# Patient Record
Sex: Female | Born: 1935
Health system: Southern US, Community
[De-identification: ages and names within clinical notes are randomized; demographics above are authoritative.]

## PROBLEM LIST (undated history)

## (undated) DIAGNOSIS — R0600 Dyspnea, unspecified: Secondary | ICD-10-CM

## (undated) DIAGNOSIS — I5032 Chronic diastolic (congestive) heart failure: Secondary | ICD-10-CM

## (undated) DIAGNOSIS — R600 Localized edema: Secondary | ICD-10-CM

## (undated) DIAGNOSIS — M545 Low back pain, unspecified: Secondary | ICD-10-CM

## (undated) DIAGNOSIS — Z87442 Personal history of urinary calculi: Secondary | ICD-10-CM

## (undated) DIAGNOSIS — M8580 Other specified disorders of bone density and structure, unspecified site: Secondary | ICD-10-CM

## (undated) DIAGNOSIS — I10 Essential (primary) hypertension: Secondary | ICD-10-CM

## (undated) DIAGNOSIS — M1990 Unspecified osteoarthritis, unspecified site: Secondary | ICD-10-CM

## (undated) DIAGNOSIS — I340 Nonrheumatic mitral (valve) insufficiency: Secondary | ICD-10-CM

## (undated) DIAGNOSIS — R251 Tremor, unspecified: Secondary | ICD-10-CM

## (undated) DIAGNOSIS — H35033 Hypertensive retinopathy, bilateral: Secondary | ICD-10-CM

## (undated) DIAGNOSIS — S86019A Strain of unspecified Achilles tendon, initial encounter: Secondary | ICD-10-CM

## (undated) HISTORY — DX: Other specified disorders of bone density and structure, unspecified site: M85.80

## (undated) HISTORY — DX: Essential (primary) hypertension: I10

## (undated) HISTORY — PX: TRANSTHORACIC ECHOCARDIOGRAM: SHX275

## (undated) HISTORY — DX: Unspecified osteoarthritis, unspecified site: M19.90

## (undated) HISTORY — DX: Hypertensive retinopathy, bilateral: H35.033

## (undated) HISTORY — DX: Strain of unspecified achilles tendon, initial encounter: S86.019A

## (undated) HISTORY — DX: Localized edema: R60.0

## (undated) HISTORY — DX: Low back pain: M54.5

## (undated) HISTORY — PX: TONSILLECTOMY: SUR1361

## (undated) HISTORY — PX: CHOLECYSTECTOMY: SHX55

## (undated) HISTORY — DX: Nonrheumatic mitral (valve) insufficiency: I34.0

## (undated) HISTORY — DX: Tremor, unspecified: R25.1

## (undated) HISTORY — DX: Low back pain, unspecified: M54.50

## (undated) HISTORY — PX: APPENDECTOMY: SHX54

---

## 2001-08-17 ENCOUNTER — Other Ambulatory Visit: Admission: RE | Admit: 2001-08-17 | Discharge: 2001-08-17 | Payer: Self-pay | Admitting: Obstetrics and Gynecology

## 2002-04-28 ENCOUNTER — Emergency Department (HOSPITAL_COMMUNITY): Admission: EM | Admit: 2002-04-28 | Discharge: 2002-04-28 | Payer: Self-pay | Admitting: Emergency Medicine

## 2002-04-28 ENCOUNTER — Encounter: Payer: Self-pay | Admitting: Emergency Medicine

## 2002-04-30 ENCOUNTER — Encounter: Payer: Self-pay | Admitting: Emergency Medicine

## 2002-04-30 ENCOUNTER — Emergency Department (HOSPITAL_COMMUNITY): Admission: EM | Admit: 2002-04-30 | Discharge: 2002-04-30 | Payer: Self-pay | Admitting: Emergency Medicine

## 2002-05-03 ENCOUNTER — Encounter: Payer: Self-pay | Admitting: Neurological Surgery

## 2002-05-03 ENCOUNTER — Inpatient Hospital Stay (HOSPITAL_COMMUNITY): Admission: AD | Admit: 2002-05-03 | Discharge: 2002-05-08 | Payer: Self-pay | Admitting: Neurological Surgery

## 2002-05-14 ENCOUNTER — Encounter: Payer: Self-pay | Admitting: Neurological Surgery

## 2002-05-14 ENCOUNTER — Inpatient Hospital Stay (HOSPITAL_COMMUNITY): Admission: AD | Admit: 2002-05-14 | Discharge: 2002-05-17 | Payer: Self-pay | Admitting: Neurological Surgery

## 2002-05-15 ENCOUNTER — Encounter: Payer: Self-pay | Admitting: Neurological Surgery

## 2002-07-11 ENCOUNTER — Other Ambulatory Visit: Admission: RE | Admit: 2002-07-11 | Discharge: 2002-07-11 | Payer: Self-pay | Admitting: Gynecology

## 2002-07-27 ENCOUNTER — Encounter: Payer: Self-pay | Admitting: Neurological Surgery

## 2002-07-27 ENCOUNTER — Ambulatory Visit (HOSPITAL_COMMUNITY): Admission: RE | Admit: 2002-07-27 | Discharge: 2002-07-27 | Payer: Self-pay | Admitting: Neurological Surgery

## 2002-07-28 ENCOUNTER — Encounter: Payer: Self-pay | Admitting: Neurological Surgery

## 2002-10-24 ENCOUNTER — Encounter: Payer: Self-pay | Admitting: Internal Medicine

## 2002-10-24 ENCOUNTER — Ambulatory Visit (HOSPITAL_COMMUNITY): Admission: RE | Admit: 2002-10-24 | Discharge: 2002-10-24 | Payer: Self-pay | Admitting: Internal Medicine

## 2002-10-25 HISTORY — PX: LUMBAR LAMINECTOMY: SHX95

## 2002-10-25 HISTORY — PX: BACK SURGERY: SHX140

## 2002-12-22 ENCOUNTER — Ambulatory Visit (HOSPITAL_COMMUNITY): Admission: RE | Admit: 2002-12-22 | Discharge: 2002-12-22 | Payer: Self-pay | Admitting: Neurological Surgery

## 2002-12-22 ENCOUNTER — Encounter: Payer: Self-pay | Admitting: Neurological Surgery

## 2003-01-14 ENCOUNTER — Encounter (INDEPENDENT_AMBULATORY_CARE_PROVIDER_SITE_OTHER): Payer: Self-pay | Admitting: *Deleted

## 2003-01-14 ENCOUNTER — Ambulatory Visit (HOSPITAL_COMMUNITY): Admission: RE | Admit: 2003-01-14 | Discharge: 2003-01-14 | Payer: Self-pay | Admitting: Neurological Surgery

## 2003-03-14 ENCOUNTER — Ambulatory Visit (HOSPITAL_BASED_OUTPATIENT_CLINIC_OR_DEPARTMENT_OTHER): Admission: RE | Admit: 2003-03-14 | Discharge: 2003-03-14 | Payer: Self-pay | Admitting: Urology

## 2005-01-18 ENCOUNTER — Ambulatory Visit: Payer: Self-pay | Admitting: Internal Medicine

## 2005-01-27 ENCOUNTER — Ambulatory Visit: Payer: Self-pay | Admitting: Internal Medicine

## 2005-03-01 ENCOUNTER — Ambulatory Visit: Payer: Self-pay | Admitting: Internal Medicine

## 2005-07-01 ENCOUNTER — Ambulatory Visit: Payer: Self-pay | Admitting: Internal Medicine

## 2005-10-04 ENCOUNTER — Ambulatory Visit: Payer: Self-pay | Admitting: Internal Medicine

## 2006-09-21 ENCOUNTER — Ambulatory Visit: Payer: Self-pay | Admitting: Internal Medicine

## 2006-09-21 LAB — CONVERTED CEMR LAB
ALT: 31 units/L (ref 0–40)
AST: 50 units/L — ABNORMAL HIGH (ref 0–37)
BUN: 15 mg/dL (ref 6–23)
Basophils Absolute: 0.1 10*3/uL (ref 0.0–0.1)
Basophils Relative: 1 % (ref 0.0–1.0)
Bilirubin Urine: NEGATIVE
Chol/HDL Ratio, serum: 1.9
Cholesterol: 147 mg/dL (ref 0–200)
Creatinine, Ser: 0.8 mg/dL (ref 0.4–1.2)
Crystals: NEGATIVE
Eosinophil percent: 2.8 % (ref 0.0–5.0)
Glucose, Bld: 106 mg/dL — ABNORMAL HIGH (ref 70–99)
HCT: 43.4 % (ref 36.0–46.0)
HDL: 78.8 mg/dL (ref >39.0)
Hemoglobin, Urine: NEGATIVE
Hemoglobin: 14.3 g/dL (ref 12.0–15.0)
Ketones, ur: NEGATIVE mg/dL
LDL Cholesterol: 62 mg/dL (ref 0–99)
Lymphocytes Relative: 38.7 % (ref 12.0–46.0)
MCHC: 33 g/dL (ref 30.0–36.0)
MCV: 88.9 fL (ref 78.0–100.0)
Monocytes Absolute: 0.5 10*3/uL (ref 0.2–0.7)
Monocytes Relative: 7.5 % (ref 3.0–11.0)
Neutro Abs: 2.9 10*3/uL (ref 1.4–7.7)
Neutrophils Relative %: 50 % (ref 43.0–77.0)
Nitrite: NEGATIVE
Platelets: 389 10*3/uL (ref 150–400)
Potassium: 4.2 meq/L (ref 3.5–5.1)
RBC: 4.88 M/uL (ref 3.87–5.11)
RDW: 13 % (ref 11.5–14.6)
Sodium: 140 meq/L (ref 135–145)
Specific Gravity, Urine: 1.02 (ref 1.000–1.03)
TSH: 1.16 microintl units/mL (ref 0.35–5.50)
Total Bilirubin: 0.8 mg/dL (ref 0.3–1.2)
Total Protein, Urine: NEGATIVE mg/dL
Triglyceride fasting, serum: 33 mg/dL (ref 0–149)
Urine Glucose: NEGATIVE mg/dL
Urobilinogen, UA: 0.2 (ref 0.0–1.0)
VLDL: 7 mg/dL (ref 0–40)
Vitamin B-12: 733 pg/mL (ref 211–911)
WBC: 6 10*3/uL (ref 4.5–10.5)
pH: 7 (ref 5.0–8.0)

## 2006-09-29 ENCOUNTER — Ambulatory Visit: Payer: Self-pay | Admitting: Internal Medicine

## 2006-10-19 ENCOUNTER — Ambulatory Visit (HOSPITAL_COMMUNITY): Admission: RE | Admit: 2006-10-19 | Discharge: 2006-10-19 | Payer: Self-pay | Admitting: Internal Medicine

## 2006-11-09 ENCOUNTER — Ambulatory Visit: Payer: Self-pay | Admitting: Internal Medicine

## 2007-04-03 ENCOUNTER — Ambulatory Visit: Payer: Self-pay | Admitting: Internal Medicine

## 2007-04-07 ENCOUNTER — Ambulatory Visit: Payer: Self-pay | Admitting: Internal Medicine

## 2007-04-07 LAB — CONVERTED CEMR LAB
BUN: 26 mg/dL — ABNORMAL HIGH (ref 6–23)
CO2: 31 meq/L (ref 19–32)
Calcium: 9.3 mg/dL (ref 8.4–10.5)
Chloride: 105 meq/L (ref 96–112)
Creatinine, Ser: 0.8 mg/dL (ref 0.4–1.2)
GFR calc Af Amer: 91 mL/min
GFR calc non Af Amer: 75 mL/min
Glucose, Bld: 98 mg/dL (ref 70–99)
Potassium: 4.2 meq/L (ref 3.5–5.1)
Sodium: 142 meq/L (ref 135–145)
TSH: 1.41 microintl units/mL (ref 0.35–5.50)
Vit D, 1,25-Dihydroxy: 52 (ref 20–57)

## 2007-05-24 ENCOUNTER — Ambulatory Visit: Payer: Self-pay | Admitting: Internal Medicine

## 2007-10-20 ENCOUNTER — Ambulatory Visit: Payer: Self-pay | Admitting: Internal Medicine

## 2007-10-20 DIAGNOSIS — M79609 Pain in unspecified limb: Secondary | ICD-10-CM | POA: Insufficient documentation

## 2007-10-20 DIAGNOSIS — M109 Gout, unspecified: Secondary | ICD-10-CM | POA: Insufficient documentation

## 2007-10-21 DIAGNOSIS — I10 Essential (primary) hypertension: Secondary | ICD-10-CM | POA: Insufficient documentation

## 2007-10-21 DIAGNOSIS — M899 Disorder of bone, unspecified: Secondary | ICD-10-CM | POA: Insufficient documentation

## 2007-10-21 DIAGNOSIS — M545 Low back pain, unspecified: Secondary | ICD-10-CM | POA: Insufficient documentation

## 2007-10-21 DIAGNOSIS — M1990 Unspecified osteoarthritis, unspecified site: Secondary | ICD-10-CM | POA: Insufficient documentation

## 2007-10-21 DIAGNOSIS — M4180 Other forms of scoliosis, site unspecified: Secondary | ICD-10-CM | POA: Insufficient documentation

## 2007-10-21 DIAGNOSIS — M949 Disorder of cartilage, unspecified: Secondary | ICD-10-CM

## 2007-10-25 ENCOUNTER — Ambulatory Visit: Payer: Self-pay | Admitting: Internal Medicine

## 2007-10-31 LAB — CONVERTED CEMR LAB
BUN: 18 mg/dL (ref 6–23)
CO2: 33 meq/L — ABNORMAL HIGH (ref 19–32)
Calcium: 9 mg/dL (ref 8.4–10.5)
Chloride: 102 meq/L (ref 96–112)
Creatinine, Ser: 0.6 mg/dL (ref 0.4–1.2)
GFR calc Af Amer: 127 mL/min
GFR calc non Af Amer: 105 mL/min
Glucose, Bld: 107 mg/dL — ABNORMAL HIGH (ref 70–99)
Potassium: 3.8 meq/L (ref 3.5–5.1)
Sodium: 142 meq/L (ref 135–145)
Uric Acid, Serum: 4.3 mg/dL (ref 2.4–7.0)

## 2007-11-10 ENCOUNTER — Ambulatory Visit: Payer: Self-pay | Admitting: Internal Medicine

## 2007-11-10 DIAGNOSIS — R21 Rash and other nonspecific skin eruption: Secondary | ICD-10-CM | POA: Insufficient documentation

## 2008-01-25 ENCOUNTER — Ambulatory Visit: Payer: Self-pay | Admitting: Internal Medicine

## 2008-01-25 ENCOUNTER — Encounter (INDEPENDENT_AMBULATORY_CARE_PROVIDER_SITE_OTHER): Payer: Self-pay | Admitting: *Deleted

## 2008-01-25 DIAGNOSIS — H9190 Unspecified hearing loss, unspecified ear: Secondary | ICD-10-CM | POA: Insufficient documentation

## 2008-02-01 ENCOUNTER — Ambulatory Visit: Payer: Self-pay | Admitting: Internal Medicine

## 2008-02-01 DIAGNOSIS — L0293 Carbuncle, unspecified: Secondary | ICD-10-CM

## 2008-02-01 DIAGNOSIS — L0292 Furuncle, unspecified: Secondary | ICD-10-CM | POA: Insufficient documentation

## 2008-07-03 ENCOUNTER — Encounter: Payer: Self-pay | Admitting: Internal Medicine

## 2008-07-19 ENCOUNTER — Ambulatory Visit: Payer: Self-pay | Admitting: Internal Medicine

## 2008-08-01 ENCOUNTER — Encounter: Payer: Self-pay | Admitting: Internal Medicine

## 2008-11-11 ENCOUNTER — Ambulatory Visit: Payer: Self-pay | Admitting: Internal Medicine

## 2008-11-11 DIAGNOSIS — R252 Cramp and spasm: Secondary | ICD-10-CM | POA: Insufficient documentation

## 2008-11-13 LAB — CONVERTED CEMR LAB
ALT: 22 units/L (ref 0–35)
AST: 25 units/L (ref 0–37)
Albumin: 4.2 g/dL (ref 3.5–5.2)
Alkaline Phosphatase: 68 units/L (ref 39–117)
BUN: 22 mg/dL (ref 6–23)
Bacteria, UA: NEGATIVE
Basophils Absolute: 0 10*3/uL (ref 0.0–0.1)
Basophils Relative: 0.2 % (ref 0.0–3.0)
Bilirubin Urine: NEGATIVE
Bilirubin, Direct: 0.1 mg/dL (ref 0.0–0.3)
CO2: 32 meq/L (ref 19–32)
Calcium: 9.1 mg/dL (ref 8.4–10.5)
Chloride: 105 meq/L (ref 96–112)
Cholesterol: 153 mg/dL (ref 0–200)
Creatinine, Ser: 0.7 mg/dL (ref 0.4–1.2)
Crystals: NEGATIVE
Eosinophils Absolute: 0.2 10*3/uL (ref 0.0–0.7)
Eosinophils Relative: 2.3 % (ref 0.0–5.0)
GFR calc Af Amer: 106 mL/min
GFR calc non Af Amer: 88 mL/min
Glucose, Bld: 105 mg/dL — ABNORMAL HIGH (ref 70–99)
HCT: 42.2 % (ref 36.0–46.0)
HDL: 85.3 mg/dL (ref >39.0)
Hemoglobin: 14.7 g/dL (ref 12.0–15.0)
Ketones, ur: 15 mg/dL — AB
LDL Cholesterol: 64 mg/dL (ref 0–99)
Leukocytes, UA: NEGATIVE
Lymphocytes Relative: 29.4 % (ref 12.0–46.0)
MCHC: 34.8 g/dL (ref 30.0–36.0)
MCV: 87.9 fL (ref 78.0–100.0)
Monocytes Absolute: 0.8 10*3/uL (ref 0.1–1.0)
Monocytes Relative: 9.1 % (ref 3.0–12.0)
Mucus, UA: NEGATIVE
Neutro Abs: 5.5 10*3/uL (ref 1.4–7.7)
Neutrophils Relative %: 59 % (ref 43.0–77.0)
Nitrite: NEGATIVE
Platelets: 313 10*3/uL (ref 150–400)
Potassium: 3.7 meq/L (ref 3.5–5.1)
RBC: 4.8 M/uL (ref 3.87–5.11)
RDW: 13.8 % (ref 11.5–14.6)
Sodium: 143 meq/L (ref 135–145)
Specific Gravity, Urine: >=1.03 (ref 1.000–1.03)
TSH: 1.78 microintl units/mL (ref 0.35–5.50)
Total Bilirubin: 1 mg/dL (ref 0.3–1.2)
Total CHOL/HDL Ratio: 1.8
Total Protein, Urine: NEGATIVE mg/dL
Total Protein: 7.3 g/dL (ref 6.0–8.3)
Triglycerides: 20 mg/dL (ref 0–149)
Urine Glucose: NEGATIVE mg/dL
Urobilinogen, UA: 0.2 (ref 0.0–1.0)
VLDL: 4 mg/dL (ref 0–40)
WBC: 9.2 10*3/uL (ref 4.5–10.5)
pH: 5 (ref 5.0–8.0)

## 2008-11-22 ENCOUNTER — Telehealth: Payer: Self-pay | Admitting: Internal Medicine

## 2008-12-16 ENCOUNTER — Ambulatory Visit: Payer: Self-pay | Admitting: Internal Medicine

## 2008-12-23 ENCOUNTER — Encounter (INDEPENDENT_AMBULATORY_CARE_PROVIDER_SITE_OTHER): Payer: Self-pay | Admitting: *Deleted

## 2009-01-06 ENCOUNTER — Encounter: Payer: Self-pay | Admitting: Internal Medicine

## 2009-02-06 ENCOUNTER — Encounter: Payer: Self-pay | Admitting: Internal Medicine

## 2009-02-07 ENCOUNTER — Encounter: Payer: Self-pay | Admitting: Internal Medicine

## 2009-02-10 ENCOUNTER — Ambulatory Visit: Payer: Self-pay | Admitting: Internal Medicine

## 2009-02-10 LAB — CONVERTED CEMR LAB
BUN: 30 mg/dL — ABNORMAL HIGH (ref 6–23)
CO2: 31 meq/L (ref 19–32)
Calcium: 9.5 mg/dL (ref 8.4–10.5)
Chloride: 106 meq/L (ref 96–112)
Creatinine, Ser: 0.8 mg/dL (ref 0.4–1.2)
GFR calc non Af Amer: 74.95 mL/min (ref >60)
Glucose, Bld: 88 mg/dL (ref 70–99)
Potassium: 4.4 meq/L (ref 3.5–5.1)
Sodium: 143 meq/L (ref 135–145)
TSH: 2.27 microintl units/mL (ref 0.35–5.50)
Vitamin B-12: 850 pg/mL (ref 211–911)

## 2009-02-12 ENCOUNTER — Ambulatory Visit: Payer: Self-pay | Admitting: Internal Medicine

## 2009-02-12 DIAGNOSIS — R609 Edema, unspecified: Secondary | ICD-10-CM | POA: Insufficient documentation

## 2009-02-12 DIAGNOSIS — R635 Abnormal weight gain: Secondary | ICD-10-CM | POA: Insufficient documentation

## 2009-07-01 ENCOUNTER — Telehealth: Payer: Self-pay | Admitting: Internal Medicine

## 2009-07-14 ENCOUNTER — Ambulatory Visit: Payer: Self-pay | Admitting: Internal Medicine

## 2009-11-21 ENCOUNTER — Ambulatory Visit: Payer: Self-pay | Admitting: Internal Medicine

## 2009-11-25 ENCOUNTER — Telehealth: Payer: Self-pay | Admitting: Internal Medicine

## 2009-11-27 ENCOUNTER — Encounter: Payer: Self-pay | Admitting: Internal Medicine

## 2009-11-28 ENCOUNTER — Telehealth: Payer: Self-pay | Admitting: Internal Medicine

## 2009-12-03 ENCOUNTER — Encounter: Payer: Self-pay | Admitting: Internal Medicine

## 2009-12-04 ENCOUNTER — Telehealth: Payer: Self-pay | Admitting: Internal Medicine

## 2009-12-08 ENCOUNTER — Telehealth: Payer: Self-pay | Admitting: Internal Medicine

## 2010-05-19 ENCOUNTER — Ambulatory Visit: Payer: Self-pay | Admitting: Internal Medicine

## 2010-05-19 LAB — CONVERTED CEMR LAB
ALT: 19 units/L (ref 0–35)
AST: 20 units/L (ref 0–37)
Albumin: 3.8 g/dL (ref 3.5–5.2)
Alkaline Phosphatase: 67 units/L (ref 39–117)
BUN: 26 mg/dL — ABNORMAL HIGH (ref 6–23)
Bilirubin Urine: NEGATIVE
Bilirubin, Direct: 0.1 mg/dL (ref 0.0–0.3)
CO2: 31 meq/L (ref 19–32)
Calcium: 8.7 mg/dL (ref 8.4–10.5)
Chloride: 105 meq/L (ref 96–112)
Cholesterol: 137 mg/dL (ref 0–200)
Creatinine, Ser: 0.7 mg/dL (ref 0.4–1.2)
GFR calc non Af Amer: 85.71 mL/min (ref >60)
Glucose, Bld: 83 mg/dL (ref 70–99)
HDL: 65.7 mg/dL (ref >39.00)
Ketones, ur: NEGATIVE mg/dL
LDL Cholesterol: 65 mg/dL (ref 0–99)
Nitrite: NEGATIVE
Potassium: 4.4 meq/L (ref 3.5–5.1)
Sodium: 142 meq/L (ref 135–145)
Specific Gravity, Urine: >=1.03 (ref 1.000–1.030)
TSH: 3.65 microintl units/mL (ref 0.35–5.50)
Total Bilirubin: 0.5 mg/dL (ref 0.3–1.2)
Total CHOL/HDL Ratio: 2
Total Protein: 6.5 g/dL (ref 6.0–8.3)
Triglycerides: 34 mg/dL (ref 0.0–149.0)
Urine Glucose: NEGATIVE mg/dL
Urobilinogen, UA: 0.2 (ref 0.0–1.0)
VLDL: 6.8 mg/dL (ref 0.0–40.0)
pH: 6 (ref 5.0–8.0)

## 2010-05-21 ENCOUNTER — Ambulatory Visit: Payer: Self-pay | Admitting: Internal Medicine

## 2010-05-21 DIAGNOSIS — N3090 Cystitis, unspecified without hematuria: Secondary | ICD-10-CM | POA: Insufficient documentation

## 2010-05-21 DIAGNOSIS — M542 Cervicalgia: Secondary | ICD-10-CM | POA: Insufficient documentation

## 2010-11-19 ENCOUNTER — Ambulatory Visit
Admission: RE | Admit: 2010-11-19 | Discharge: 2010-11-19 | Payer: Self-pay | Source: Home / Self Care | Attending: Internal Medicine | Admitting: Internal Medicine

## 2010-11-20 ENCOUNTER — Telehealth: Payer: Self-pay | Admitting: Internal Medicine

## 2010-11-24 NOTE — Assessment & Plan Note (Signed)
Summary: PER PT [facility]  $50  D/T   STC   Vital Signs:  Patient Profile:   76 Years Old Female Height:     63 inches Weight:      150 pounds Temp:     97.5 degrees F oral Pulse rate:   71 / minute BP sitting:   146 / 70  (left arm)  Vitals Entered By: Tora Perches (December 16, 2008 4:22 PM)                 Chief Complaint:  Multiple medical problems or concerns.  History of Present Illness: C/o swelling of R eye Meloxicam is not working for OA    Prior Medications Reviewed Using: Patient Recall  Updated Prior Medication List: VITAMIN D3 1000 UNIT  TABS (CHOLECALCIFEROL) 1 by mouth daily TRETINOIN 0.1 % CREA (TRETINOIN) use qd LORATADINE 10 MG  TABS (LORATADINE) once daily as needed allergies MELOXICAM 15 MG TABS (MELOXICAM) one by mouth daily pc prn BENICAR 40 MG TABS (OLMESARTAN MEDOXOMIL) once daily  Current Allergies (reviewed today): ! BARBITURATES ! LASIX ! HYDROCHLOROTHIAZIDE MICARDIS (TELMISARTAN)  Past Medical History:    Reviewed history from 10/20/2007 and no changes required:       Hypertension       Low back pain       Osteoarthritis       Osteopenia   Family History:    Reviewed history from 10/20/2007 and no changes required:       Family History Hypertension  Social History:    Reviewed history from 10/25/2007 and no changes required:       Married       Alcohol use-no     Physical Exam  General:     Well-developed,well-nourished,in no acute distress; alert,appropriate and cooperative throughout examination Eyes:     R upper eyelid is swollen and tender lateally with an oval mass sq Nose:     External nasal examination shows no deformity or inflammation. Nasal mucosa are pink and moist without lesions or exudates. Mouth:     Oral mucosa and oropharynx without lesions or exudates.  Teeth in good repair. Lungs:     Normal respiratory effort, chest expands symmetrically. Lungs are clear to auscultation, no crackles or  wheezes. Heart:     Normal rate and regular rhythm. S1 and S2 normal without gallop, murmur, click, rub or other extra sounds.    Impression & Recommendations:  Problem # 1:  BOILS, RECURRENT (ICD-680.9) - R upper eyelid seb. cyst, infected Assessment: Improved Oph consult Orders: Ophthalmology Referral (Ophthalmology)   Problem # 2:  OSTEOARTHRITIS (ICD-715.90) Assessment: Comment Only Celebrex needs PA Her updated medication list for this problem includes:    Meloxicam 15 Mg Tabs (Meloxicam) ..... One by mouth daily pc as needed - not helping  Her updated medication list for this problem includes:    Meloxicam 15 Mg Tabs (Meloxicam) ..... One by mouth daily pc prn   Complete Medication List: 1)  Vitamin D3 1000 Unit Tabs (Cholecalciferol) .Marland Kitchen.. 1 by mouth daily 2)  Tretinoin 0.1 % Crea (Tretinoin) .... Use qd 3)  Loratadine 10 Mg Tabs (Loratadine) .... Once daily as needed allergies 4)  Meloxicam 15 Mg Tabs (Meloxicam) .... One by mouth daily pc prn 5)  Benicar 40 Mg Tabs (Olmesartan medoxomil) .... Once daily   Patient Instructions: 1)  Call if you are not better in a reasonable ammount of time or if worse.

## 2010-11-24 NOTE — Progress Notes (Signed)
Summary: Celebrex  Phone Note Call from Patient   Summary of Call: Celebrex has been denied. Please advise. Initial call taken by: Lucious Groves,  November 28, 2009 4:38 PM  Follow-up for Phone Call        Noted. Thx Follow-up by: Tresa Garter MD,  December 01, 2009 11:42 AM  Additional Follow-up for Phone Call Additional follow up Details #1::        I will notify patient. Closed other phone note to prevent confusion, but patient needs to switch Benicar to Losartan due to cost. Is this ok? Additional Follow-up by: Lucious Groves,  December 02, 2009 9:58 AM    Additional Follow-up for Phone Call Additional follow up Details #2::    OK Thx Follow-up by: Tresa Garter MD,  December 03, 2009 7:43 AM  Additional Follow-up for Phone Call Additional follow up Details #3:: Details for Additional Follow-up Action Taken: Left message on machine to call back to office. Lucious Groves  December 03, 2009 10:31 AM   Spoke with pt, she is aware Additional Follow-up by: Lamar Sprinkles, CMA,  December 04, 2009 8:16 AM  New/Updated Medications: COZAAR 100 MG TABS (LOSARTAN POTASSIUM) 1 by mouth qd Prescriptions: COZAAR 100 MG TABS (LOSARTAN POTASSIUM) 1 by mouth qd  #90 x 3   Entered by:   Lamar Sprinkles, CMA   Authorized by:   Tresa Garter MD   Signed by:   Lamar Sprinkles, CMA on 12/04/2009   Method used:   Electronically to        [facility] [phone]* (retail)       [address] [place], [place]  [address]       Ph: [phone]       Fax: [phone]   RxID:   [id number] COZAAR 100 MG TABS (LOSARTAN POTASSIUM) 1 by mouth qd  #90 x 3   Entered and Authorized by:   Tresa Garter MD   Signed by:   Daphane Shepherd on 12/04/2009   Method used:   Electronically to        [facility] [phone]* (retail)       [address] [place], [place]  [address]       Ph: [phone]       Fax: [phone]   RxID:   [id number]  rx sent by Alvy Beal from [facility]

## 2010-11-24 NOTE — Medication Information (Signed)
Summary: Celebrex Approved/[facility]  Celebrex Approved/[facility]   Imported By: Sherian Rein 02/11/2009 11:20:00  _____________________________________________________________________  External Attachment:    Type:   Image     Comment:   External Document

## 2010-11-24 NOTE — Miscellaneous (Signed)
Summary: loratadine  Clinical Lists Changes  Medications: Added new medication of LORATADINE 10 MG  TABS (LORATADINE) once daily as needed - Signed Rx of LORATADINE 10 MG  TABS (LORATADINE) once daily as needed;  #90 x 0;  Signed;  Entered by: Tora Perches;  Authorized by: Tresa Garter MD;  Method used: Electronic    Prescriptions: LORATADINE 10 MG  TABS (LORATADINE) once daily as needed  #90 x 0   Entered by:   Tora Perches   Authorized by:   Tresa Garter MD   Signed by:   Tora Perches on 10/20/2007   Method used:   Electronically sent to ...       [facility]  [address]*       [address] [place], [place]  [address]       Ph: [phone]       Fax: [phone]   RxID:   [id number]

## 2010-11-24 NOTE — Assessment & Plan Note (Signed)
Summary: L ANKLE RED PAIN-INSIDE-STC   Vital Signs:  Patient Profile:   76 Years Old Female Weight:      146 pounds Temp:     97.3 degrees F oral Pulse rate:   75 / minute BP sitting:   136 / 72  (left arm)  Vitals Entered By: Tora Perches (October 20, 2007 3:10 PM)             Is Patient Diabetic? No     Chief Complaint:  Multiple medical problems or concerns.  History of Present Illness: The patient presents with complaints of sore throat, fever, cough, sinus congestion and drainge of several days duration. Not better with OTC meds. Chest hurts with coughing.  The mucus is colored. C/o L medial malleolus redness and pain  x3 d    Past Medical History:    Hypertension    Low back pain    Osteoarthritis    Osteopenia   Family History:    Family History Hypertension  Social History:    Occupation:    Married    Risk Factors:  Tobacco use:  never    Physical Exam  General:     Well-developed,well-nourished,in no acute distress; alert,appropriate and cooperative throughout examination Eyes:     No corneal or conjunctival inflammation noted. EOMI. Perrla. Funduscopic exam benign, without hemorrhages, exudates or papilledema. Vision grossly normal. Mouth:     Oral mucosa and oropharynx without lesions or exudates.  Teeth in good repair. Neck:     No deformities, masses, or tenderness noted. Lungs:     Normal respiratory effort, chest expands symmetrically. Lungs are clear to auscultation, no crackles or wheezes. Heart:     Normal rate and regular rhythm. S1 and S2 normal without gallop, murmur, click, rub or other extra sounds. Msk:     L med. malleolus is tender Extremities:     No clubbing, cyanosis, edema, or deformity noted with normal full range of motion of all joints.   Skin:     3 cm red tender area over the med. malleolus Psych:     Cognition and judgment appear intact. Alert and cooperative with normal attention span and concentration. No  apparent delusions, illusions, hallucinations    Impression & Recommendations:  Problem # 1:  UPPER RESPIRATORY INFECTION (URI) (ICD-465.9) Zpac Her updated medication list for this problem includes:    Loratadine 10 Mg Tabs (Loratadine) ..... Once daily as needed      Her updated medication list for this problem includes:    Loratadine 10 Mg Tabs (Loratadine) ..... Once daily as needed    Indomethacin 50 Mg Caps (Indomethacin) .Marland Kitchen... 1 by mouth three times a day x 1-2d, then two times a day as needed gout   Problem # 2:  LEG PAIN (ICD-729.5) Poss. gout. Remote poss. of cellulitis (doubt). Indocin and Z pac  Complete Medication List: 1)  Loratadine 10 Mg Tabs (Loratadine) .... Once daily as needed 2)  Indomethacin 50 Mg Caps (Indomethacin) .Marland Kitchen.. 1 by mouth three times a day x 1-2d, then two times a day as needed gout 3)  Zithromax Z-pak 250 Mg Tabs (Azithromycin) .... As directed 4)  Vitamin D3 1000 Unit Tabs (Cholecalciferol) .Marland Kitchen.. 1 qd     Prescriptions: INDOMETHACIN 50 MG CAPS (INDOMETHACIN) 1 by mouth three times a day x 1-2d, then two times a day as needed gout  #60 x 3   Entered and Authorized by:   Tresa Garter MD   Signed  by:   Tresa Garter MD on 10/20/2007   Method used:   Print then Give to Patient   RxID:   [id number] ZITHROMAX Z-PAK 250 MG  TABS (AZITHROMYCIN) as directed  #1 x 0   Entered and Authorized by:   Tresa Garter MD   Signed by:   Tresa Garter MD on 10/20/2007   Method used:   Print then Give to Patient   RxID:   [id number] ZITHROMAX Z-PAK 250 MG  TABS (AZITHROMYCIN) as directed  #1 x 0   Entered and Authorized by:   Tresa Garter MD   Signed by:   Tresa Garter MD on 10/20/2007   Method used:   Electronically sent to ...       [facility]  [address]*       [address] [place], [place]  [address]       Ph: [phone]       Fax: [phone]   RxID:   [id number] INDOMETHACIN 50 MG CAPS  (INDOMETHACIN) 1 by mouth three times a day x 1-2d, then two times a day as needed gout  #60 x 3   Entered and Authorized by:   Tresa Garter MD   Signed by:   Tresa Garter MD on 10/20/2007   Method used:   Electronically sent to ...       [facility]  [address]       [address] [place], [place]  [address]       Ph: [phone]       Fax: [phone]   RxID:   [id number]  ]

## 2010-11-24 NOTE — Assessment & Plan Note (Signed)
Summary: FU Erin [name] #   Vital Signs:  Patient profile:   76 year old female Weight:      147 pounds Temp:     97.3 degrees F oral Pulse rate:   77 / minute BP sitting:   134 / 80  (left arm)  Vitals Entered By: Tora Perches (July 14, 2009 3:55 PM) CC: f/u Is Patient Diabetic? No   Primary Care Provider:  Tresa Garter MD  CC:  f/u.  History of Present Illness: The patient presents for a follow up of hypertension, wt gain, OA   Current Medications (verified): 1)  Vitamin D3 1000 Unit  Tabs (Cholecalciferol) .Marland Kitchen.. 1 By Mouth Daily 2)  Tretinoin 0.1 % Crea (Tretinoin) .... Use Qd 3)  Loratadine 10 Mg  Tabs (Loratadine) .... Once Daily As Needed Allergies 4)  Furosemide 20 Mg Tabs (Furosemide) .... Take 1 Tab By Mouth Every Day As Needed Swelling 5)  Phentermine Hcl 37.5 Mg Tabs (Phentermine Hcl) .Marland Kitchen.. 1 By Mouth Q Am 6)  Potassium Chloride Cr 10 Meq  Tbcr (Potassium Chloride) .Marland Kitchen.. 1 By Mouth Once Daily With Lasix 7)  Avapro 300 Mg Tabs (Irbesartan) .Marland Kitchen.. 1 By Mouth Qd  Allergies: 1)  ! Barbiturates 2)  ! Lasix 3)  ! Hydrochlorothiazide 4)  Micardis (Telmisartan) 5)  Benicar (Olmesartan Medoxomil)  Past History:  Past Medical History: Last updated: 10/20/2007 Hypertension Low back pain Osteoarthritis Osteopenia  Social History: Last updated: 10/25/2007 Married Alcohol use-no  Review of Systems  The patient denies fever, chest pain, dyspnea on exertion, and abdominal pain.    Physical Exam  General:  Well-developed,well-nourished,in no acute distress; alert,appropriate and cooperative throughout examination Nose:  External nasal examination shows no deformity or inflammation. Nasal mucosa are pink and moist without lesions or exudates. Mouth:  Oral mucosa and oropharynx without lesions or exudates.  Teeth in good repair. Lungs:  Normal respiratory effort, chest expands symmetrically. Lungs are clear to auscultation, no crackles or wheezes. Heart:   Normal rate and regular rhythm. S1 and S2 normal without gallop, murmur, click, rub or other extra sounds. Abdomen:  Bowel sounds positive,abdomen soft and non-tender without masses, organomegaly or hernias noted. Msk:  No deformity or scoliosis noted of thoracic or lumbar spine.   Extremities:  No clubbing, cyanosis, edema, or deformity noted with normal full range of motion of all joints.   Neurologic:  No cranial nerve deficits noted. Station and gait are normal. Plantar reflexes are down-going bilaterally. DTRs are symmetrical throughout. Sensory, motor and coordinative functions appear intact. Skin:  Intact without suspicious lesions or rashes Psych:  Cognition and judgment appear intact. Alert and cooperative with normal attention span and concentration. No apparent delusions, illusions, hallucinations   Impression & Recommendations:  Problem # 1:  HYPERTENSION (ICD-401.9) Assessment Improved  Her updated medication list for this problem includes:    Furosemide 20 Mg Tabs (Furosemide) .Marland Kitchen... Take 1 tab by mouth every day as needed swelling    Avapro 300 Mg Tabs (Irbesartan) .Marland Kitchen... 1 by mouth qd  Problem # 2:  LOW BACK PAIN (ICD-724.2) Assessment: Improved  The following medications were removed from the medication list:    Meloxicam 15 Mg Tabs (Meloxicam) ..... One by mouth daily pc prn  Problem # 3:  OSTEOARTHRITIS (ICD-715.90) Assessment: Unchanged  The following medications were removed from the medication list:    Meloxicam 15 Mg Tabs (Meloxicam) ..... One by mouth daily pc prn  Problem # 4:  WEIGHT GAIN (ICD-783.1) Assessment:  Comment Only On prescription therapy  See "Patient Instructions".   Complete Medication List: 1)  Vitamin D3 1000 Unit Tabs (Cholecalciferol) .Marland Kitchen.. 1 by mouth daily 2)  Tretinoin 0.1 % Crea (Tretinoin) .... Use qd 3)  Loratadine 10 Mg Tabs (Loratadine) .... Once daily as needed allergies 4)  Furosemide 20 Mg Tabs (Furosemide) .... Take 1 tab by  mouth every day as needed swelling 5)  Phentermine Hcl 37.5 Mg Tabs (Phentermine hcl) .Marland Kitchen.. 1 by mouth q am 6)  Potassium Chloride Cr 10 Meq Tbcr (Potassium chloride) .Marland Kitchen.. 1 by mouth once daily with lasix 7)  Avapro 300 Mg Tabs (Irbesartan) .Marland Kitchen.. 1 by mouth qd  Patient Instructions: 1)  Try to eat more raw plant food, fresh and dry fruit, raw almonds, leafy vegetables, whole foods and less red meat, less animal fat. Poultry and fish is better for you than pork and beef. Avoid processed foods (canned soups, hot dogs, sausage, bacon , frozen dinners). Avoid corn syrup, high fructose syrup or aspartam and Splenda  containing drinks. Honey, Agave and Stevia are better sweeteners. Make your own  dressing with olive oil, wine vinegar, lemon juce, garlic etc. for your salads. 2)  Please schedule a follow-up appointment in 6 months. 3)  BMP prior to visit, ICD-9: 4)  Hepatic Panel prior to visit, ICD-9: 5)  Lipid Panel prior to visit, ICD-9:401.1 6)  TSH prior to visit, ICD-9: Prescriptions: AVAPRO 300 MG TABS (IRBESARTAN) 1 by mouth qd  #90 x 3   Entered and Authorized by:   Tresa Garter MD   Signed by:   Tresa Garter MD on 07/14/2009   Method used:   Print then Give to Patient   RxID:   [id number] PHENTERMINE HCL 37.5 MG TABS (PHENTERMINE HCL) 1 by mouth q am  #30 x 1   Entered and Authorized by:   Tresa Garter MD   Signed by:   Tresa Garter MD on 07/14/2009   Method used:   Print then Give to Patient   RxID:   [id number]

## 2010-11-24 NOTE — Progress Notes (Signed)
Summary: Benicar  Phone Note Call from Patient   Summary of Call: Office rec'd note from [facility] that patient was given a temporary supply of Benicar. Called patient to discuss b/c this has already been removed from med list. Left message on machine to call back to office. Initial call taken by: Lucious Groves,  December 08, 2009 3:35 PM  Follow-up for Phone Call        Left message on machine to call back to office. Follow-up by: Lucious Groves,  December 09, 2009 1:33 PM  Additional Follow-up for Phone Call Additional follow up Details #1::        Patient confirmed that the Benicar is not needed. Closed phone note. Additional Follow-up by: Lucious Groves,  December 10, 2009 8:23 AM

## 2010-11-24 NOTE — Medication Information (Signed)
Summary: Prior Authorization for Celebrex/[facility]  Prior Authorization for Celebrex/[facility]   Imported By: Sherian Rein 02/15/2009 09:30:45  _____________________________________________________________________  External Attachment:    Type:   Image     Comment:   External Document

## 2010-11-24 NOTE — Assessment & Plan Note (Signed)
Summary: MEDS---D/T---STC   Vital Signs:  Patient profile:   76 year old female Weight:      148 pounds BMI:     26.31 Temp:     97.8 degrees F oral Pulse rate:   73 / minute BP sitting:   136 / 96  (right arm)  Vitals Entered By: Tora Perches (November 21, 2009 8:17 AM) CC: f/u Is Patient Diabetic? No   Primary Care Provider:  Tresa Garter MD  CC:  f/u.  History of Present Illness: The patient presents for a follow up of hypertension, hyperlipidemia, OA. C/o HA w/Avapro - stopped. C/o cramps  Preventive Screening-Counseling & Management  Alcohol-Tobacco     Smoking Status: never  Current Medications (verified): 1)  Vitamin D3 1000 Unit  Tabs (Cholecalciferol) .Marland Kitchen.. 1 By Mouth Daily 2)  Tretinoin 0.1 % Crea (Tretinoin) .... Use Qd 3)  Loratadine 10 Mg  Tabs (Loratadine) .... Once Daily As Needed Allergies 4)  Furosemide 20 Mg Tabs (Furosemide) .... Take 1 Tab By Mouth Every Day As Needed Swelling 5)  Phentermine Hcl 37.5 Mg Tabs (Phentermine Hcl) .Marland Kitchen.. 1 By Mouth Q Am 6)  Potassium Chloride Cr 10 Meq  Tbcr (Potassium Chloride) .Marland Kitchen.. 1 By Mouth Once Daily With Lasix  Allergies: 1)  ! Barbiturates 2)  ! Lasix 3)  ! Hydrochlorothiazide 4)  Micardis (Telmisartan) 5)  Benicar (Olmesartan Medoxomil) 6)  Avapro (Irbesartan)  Past History:  Past Medical History: Last updated: 10/20/2007 Hypertension Low back pain Osteoarthritis Osteopenia  Past Surgical History: Last updated: 11/10/2007 Lumbar laminectomy  Social History: Last updated: 10/25/2007 Married Alcohol use-no  Review of Systems  The patient denies fever, chest pain, syncope, dyspnea on exertion, and abdominal pain.    Physical Exam  General:  Well-developed,well-nourished,in no acute distress; alert,appropriate and cooperative throughout examination Nose:  External nasal examination shows no deformity or inflammation. Nasal mucosa are pink and moist without lesions or exudates. Mouth:   Oral mucosa and oropharynx without lesions or exudates.  Teeth in good repair. Lungs:  Normal respiratory effort, chest expands symmetrically. Lungs are clear to auscultation, no crackles or wheezes. Heart:  Normal rate and regular rhythm. S1 and S2 normal without gallop, murmur, click, rub or other extra sounds. Abdomen:  Bowel sounds positive,abdomen soft and non-tender without masses, organomegaly or hernias noted. Msk:  No deformity or scoliosis noted of thoracic or lumbar spine.   Neurologic:  No cranial nerve deficits noted. Station and gait are normal. Plantar reflexes are down-going bilaterally. DTRs are symmetrical throughout. Sensory, motor and coordinative functions appear intact. Skin:  Intact without suspicious lesions or rashes Psych:  Cognition and judgment appear intact. Alert and cooperative with normal attention span and concentration. No apparent delusions, illusions, hallucinations   Impression & Recommendations:  Problem # 1:  HYPERTENSION (ICD-401.9) Assessment Unchanged  The following medications were removed from the medication list:    Avapro 300 Mg Tabs (Irbesartan) .Marland Kitchen... 1 by mouth once daily she stopped due to a HA Her updated medication list for this problem includes:    Furosemide 20 Mg Tabs (Furosemide) .Marland Kitchen... Take 1 tab by mouth every day as needed swelling  Problem # 2:  CRAMPS,LEG (ICD-729.82) due to Lasix - mild Assessment: Comment Only Diazepam prn  Problem # 3:  OSTEOARTHRITIS (ICD-715.90) Assessment: Unchanged  Her updated medication list for this problem includes:    Celebrex 200 Mg Caps (Celecoxib) .Marland Kitchen... 1 by mouth two times a day pc as needed for arthritis  Problem #  4:  GOUT, UNSPECIFIED (ICD-274.9) Assessment: Comment Only No recurrence  Complete Medication List: 1)  Vitamin D3 1000 Unit Tabs (Cholecalciferol) .Marland Kitchen.. 1 by mouth daily 2)  Tretinoin 0.1 % Crea (Tretinoin) .... Use qd 3)  Loratadine 10 Mg Tabs (Loratadine) .... Once daily as  needed allergies 4)  Furosemide 20 Mg Tabs (Furosemide) .... Take 1 tab by mouth every day as needed swelling 5)  Phentermine Hcl 37.5 Mg Tabs (Phentermine hcl) .Marland Kitchen.. 1 by mouth q am 6)  Potassium Chloride Cr 10 Meq Tbcr (Potassium chloride) .Marland Kitchen.. 1 by mouth once daily with lasix 7)  Celebrex 200 Mg Caps (Celecoxib) .Marland Kitchen.. 1 by mouth two times a day pc as needed for arthritis 8)  Diazepam 2 Mg Tabs (Diazepam) .Marland Kitchen.. 1 by mouth two times a day as needed cramps  Patient Instructions: 1)  Please schedule a follow-up appointment in 6 months. 2)  BMP prior to visit, ICD-9: 3)  Hepatic Panel prior to visit, ICD-9: 4)  Lipid Panel prior to visit, ICD-9: 5)  TSH prior to visit, ICD-9: 6)  Urine-dip prior to visit, ICD-9: 401.1 7)  Call if BP is >135/85  Contraindications/Deferment of Procedures/Staging:    Treatment: Pneumovax    Contraindication: other     Test/Procedure: FLU VAX    Reason for deferment: patient declined  Prescriptions: POTASSIUM CHLORIDE CR 10 MEQ  TBCR (POTASSIUM CHLORIDE) 1 by mouth once daily with lasix  #30 x 12   Entered and Authorized by:   Tresa Garter MD   Signed by:   Tresa Garter MD on 11/21/2009   Method used:   Electronically to        [facility] [phone]* (retail)       [address] [place], [place]  [address]       Ph: [phone]       Fax: [phone]   RxID:   [id number] FUROSEMIDE 20 MG TABS (FUROSEMIDE) Take 1 tab by mouth every day as needed swelling  #30 x 12   Entered and Authorized by:   Tresa Garter MD   Signed by:   Tresa Garter MD on 11/21/2009   Method used:   Electronically to        [facility] [phone]* (retail)       [address] [place], [place]  [address]       Ph: [phone]       Fax: [phone]   RxID:   [id number] DIAZEPAM 2 MG TABS (DIAZEPAM) 1 by mouth two times a day as needed cramps  #60 x 3   Entered and Authorized by:   Tresa Garter MD   Signed by:   Tresa Garter MD on 11/21/2009   Method used:   Print then Give to Patient   RxID:   [id number] CELEBREX 200 MG CAPS (CELECOXIB) 1 by mouth two times a day pc as needed for arthritis  #60 x 12   Entered and Authorized by:   Tresa Garter MD   Signed by:   Tresa Garter MD on 11/21/2009   Method used:   Print then Give to Patient   RxID:   [id number]

## 2010-11-24 NOTE — Medication Information (Signed)
Summary: Benicar/[employer]  Benicar/[employer]   Imported By: Sherian Rein 12/11/2009 10:50:28  _____________________________________________________________________  External Attachment:    Type:   Image     Comment:   External Document

## 2010-11-24 NOTE — Letter (Signed)
Summary: [facility] Consult Scheduled Letter  [facility]  [address] [place], [place] [address]   Phone: [phone]  Fax: [phone]      01/25/2008 MRN: [id number]  [facility] [address] [place], [place]  [address]    Dear [name],      We have scheduled an appointment for you.  At the recommendation of Dr.Plotnikov, we have scheduled you a consult with Dr Christella Hartigan on 02/26/08 at 3:40pm.  Their phone number is [phone].  If this appointment day and time is not convenient for you, please feel free to call the office of the doctor you are being referred to at the number listed above and reschedule the appointment.     [facility] [address] [place] [address]   Please arrive 30 minutes prior to appointment.    Thank you,  Patient Care Coordinator [facility]

## 2010-11-24 NOTE — Letter (Signed)
Summary: [facility] Consult Scheduled Letter  [facility]  [address] [place], [place] [address]   Phone: [phone]  Fax: [phone]      12/23/2008 MRN: [id number]  [facility] [address] [place], [place]  [address]    Dear Ms. [facility],      We have scheduled an appointment for you.  At the recommendation of Dr.Plotnikov, we have scheduled you a consult with Dr Pearlean Brownie on 12/30/08 at 1:45pm.  Their phone number is [phone].  If this appointment day and time is not convenient for you, please feel free to call the office of the doctor you are being referred to at the number listed above and reschedule the appointment.     [facility] [address] [place], [place] [address]    Thank you,  Patient Care Coordinator [facility]

## 2010-11-24 NOTE — Medication Information (Signed)
Summary: CELEBREX prior auth/[employer]  [place]  CELEBREX prior auth/[employer]     Imported By: Lester Algonquin 01/09/2009 08:01:56  _____________________________________________________________________  External Attachment:    Type:   Image     Comment:   External Document

## 2010-11-24 NOTE — Assessment & Plan Note (Signed)
Summary: FU Natale Milch $50   Vital Signs:  Patient Profile:   76 Years Old Female Height:     63 inches Weight:      150 pounds Temp:     97.3 degrees F oral Pulse rate:   72 / minute BP sitting:   164 / 80  (left arm)  Vitals Entered By: Tora Perches (November 11, 2008 2:16 PM)                 Chief Complaint:  Multiple medical problems or concerns.    Prior Medications Reviewed Using: Patient Recall  Updated Prior Medication List: VITAMIN D3 1000 UNIT  TABS (CHOLECALCIFEROL) 1 by mouth daily TRETINOIN 0.1 % CREA (TRETINOIN) use qd LORATADINE 10 MG  TABS (LORATADINE) once daily as needed allergies  Current Allergies (reviewed today): ! BARBITURATES ! LASIX ! HYDROCHLOROTHIAZIDE MICARDIS (TELMISARTAN)        Impression & Recommendations:  Problem # 1:  HYPERTENSION (ICD-401.9) Assessment: Comment Only  The following medications were removed from the medication list:    Micardis 80 Mg Tabs (Telmisartan) .Marland Kitchen... 1 by mouth daily  Her updated medication list for this problem includes:    Benicar 40 Mg Tabs (Olmesartan medoxomil) .Marland Kitchen... 1 by mouth once daily  Orders: TLB-BMP (Basic Metabolic Panel-BMET) (80048-METABOL) TLB-CBC Platelet - w/Differential (85025-CBCD) TLB-Hepatic/Liver Function Pnl (80076-HEPATIC) TLB-Lipid Panel (80061-LIPID) TLB-TSH (Thyroid Stimulating Hormone) (84443-TSH) TLB-Udip ONLY (81003-UDIP)   Problem # 2:  OSTEOARTHRITIS (ICD-715.90) Assessment: Unchanged  Her updated medication list for this problem includes:    Celebrex 200 Mg Caps (Celecoxib) .Marland Kitchen... 1 by mouth two times a day pc as needed arthritis  Orders: TLB-BMP (Basic Metabolic Panel-BMET) (80048-METABOL) TLB-CBC Platelet - w/Differential (85025-CBCD) TLB-Hepatic/Liver Function Pnl (80076-HEPATIC) TLB-Lipid Panel (80061-LIPID) TLB-TSH (Thyroid Stimulating Hormone) (84443-TSH) TLB-Udip ONLY (81003-UDIP)   Problem # 3:  CRAMPS,LEG (ICD-729.82) due to Micardis Assessment:  Deteriorated Will d/c Orders: TLB-BMP (Basic Metabolic Panel-BMET) (80048-METABOL) TLB-CBC Platelet - w/Differential (85025-CBCD) TLB-Hepatic/Liver Function Pnl (80076-HEPATIC) TLB-Lipid Panel (80061-LIPID) TLB-TSH (Thyroid Stimulating Hormone) (84443-TSH) TLB-Udip ONLY (81003-UDIP)   Problem # 4:  LOW BACK PAIN (ICD-724.2) Assessment: Unchanged  Her updated medication list for this problem includes:    Celebrex 200 Mg Caps (Celecoxib) .Marland Kitchen... 1 by mouth two times a day pc as needed arthritis restart  Orders: TLB-BMP (Basic Metabolic Panel-BMET) (80048-METABOL) TLB-CBC Platelet - w/Differential (85025-CBCD) TLB-Hepatic/Liver Function Pnl (80076-HEPATIC) TLB-Lipid Panel (80061-LIPID) TLB-TSH (Thyroid Stimulating Hormone) (84443-TSH) TLB-Udip ONLY (81003-UDIP)  Her updated medication list for this problem includes:    Celebrex 200 Mg Caps (Celecoxib) .Marland Kitchen... 1 by mouth two times a day pc as needed arthritis   Complete Medication List: 1)  Vitamin D3 1000 Unit Tabs (Cholecalciferol) .Marland Kitchen.. 1 by mouth daily 2)  Tretinoin 0.1 % Crea (Tretinoin) .... Use qd 3)  Loratadine 10 Mg Tabs (Loratadine) .... Once daily as needed allergies 4)  Benicar 40 Mg Tabs (Olmesartan medoxomil) .Marland Kitchen.. 1 by mouth once daily 5)  Celebrex 200 Mg Caps (Celecoxib) .Marland Kitchen.. 1 by mouth two times a day pc as needed arthritis   Patient Instructions: 1)  Please schedule a follow-up appointment in 3 months. 2)  BMP prior to visit, ICD-9: 3)  TSH prior to visit, ICD-9: 401.1   Prescriptions: LORATADINE 10 MG  TABS (LORATADINE) once daily as needed allergies  #90 x 3   Entered and Authorized by:   Tresa Garter MD   Signed by:   Tresa Garter MD on 11/11/2008   Method used:  Print then Give to Patient   RxID:   [id number] CELEBREX 200 MG CAPS (CELECOXIB) 1 by mouth two times a day pc as needed arthritis  #60 x 6   Entered and Authorized by:   Tresa Garter MD   Signed by:   Tresa Garter MD on 11/11/2008   Method used:   Print then Give to Patient   RxID:   [id number] BENICAR 40 MG  TABS (OLMESARTAN MEDOXOMIL) 1 by mouth once daily  #90 x 3   Entered and Authorized by:   Tresa Garter MD   Signed by:   Tresa Garter MD on 11/11/2008   Method used:   Print then Give to Patient   RxID:   [id number]

## 2010-11-24 NOTE — Progress Notes (Signed)
Summary: prior auth  Phone Note From Pharmacy Call back at [phone]   Caller: Pierce [address] [phone]* Summary of Call: Per pharmacy, pt insureance will not cover Benicar 40mg   or Celebrex w/o a prior auth.  1) Insurance states she must have tried and failed at least 2 of ( Avapro, Avalide, or Micardis HCT). Patient charts shows micardis in the past, however must try either avapro or avalide. 2) Insurance will not cover celebrex w/o pt trying mobic or naproxen. Do you want to change to one of the preferreds or prior auth? Please advise thanks    [phone] (inusrance co) Initial call taken by: Rock Nephew CMA,  November 22, 2008 9:57 AM  Follow-up for Phone Call        pt called again stating that she is waiting for Dr. Macario Golds to respond back to the pharmacy  Follow-up by: Windell Norfolk,  November 26, 2008 11:10 AM  Additional Follow-up for Phone Call Additional follow up Details #1::        OK Avapro and Meloxicam Additional Follow-up by: Tresa Garter MD,  November 26, 2008 12:53 PM    Additional Follow-up for Phone Call Additional follow up Details #2::    left pt vm rx was sent Follow-up by: Windell Norfolk,  November 26, 2008 3:25 PM  New/Updated Medications: AVAPRO 300 MG TABS (IRBESARTAN) 1 by mouth qd MELOXICAM 15 MG TABS (MELOXICAM) one by mouth daily pc prn   Prescriptions: MELOXICAM 15 MG TABS (MELOXICAM) one by mouth daily pc prn  #30 x 6   Entered by:   Windell Norfolk   Authorized by:   Tresa Garter MD   Signed by:   Windell Norfolk on 11/26/2008   Method used:   Electronically to        [facility] [phone]* (retail)       [address] [place], [place]  [address]       Ph: [phone]       Fax: [phone]   RxID:   [id number] AVAPRO 300 MG TABS (IRBESARTAN) 1 by mouth qd  #30 x 12   Entered by:   Windell Norfolk   Authorized by:   Tresa Garter MD   Signed by:   Windell Norfolk on 11/26/2008   Method used:   Electronically to     [facility] [phone]* (retail)       [address] [place], [place]  [address]       Ph: [phone]       Fax: [phone]   RxID:   [id number]

## 2010-11-24 NOTE — Progress Notes (Signed)
Summary: Celebrex PA  Phone Note Call from Patient   Summary of Call: Pt called stating that she needs Dr Posey Rea to call [facility] about her Celebrex at [phone].  Initial call taken by: Josph Macho CMA,  November 25, 2009 12:56 PM  Follow-up for Phone Call        The number listed is to the PA dept. Ok to proceed? Follow-up by: Lucious Groves,  November 25, 2009 2:17 PM  Additional Follow-up for Phone Call Additional follow up Details #1::        OK Thx Additional Follow-up by: Tresa Garter MD,  November 25, 2009 5:05 PM    Additional Follow-up for Phone Call Additional follow up Details #2::    They will fax forms. Lucious Groves  November 26, 2009 9:35 AM   forms were completed and faxed. Lucious Groves  November 26, 2009 1:23 PM  RX denied b/c it does not meet [facility] medical necessity guidlines. Please advise.  Lucious Groves  November 27, 2009 4:09 PM    Additional Follow-up for Phone Call Additional follow up Details #3:: Details for Additional Follow-up Action Taken: Pt called to check status of this, also requesting Benicar and Amoxycillin for her nose? Please let pt know.Verdell Face,  November 28, 2009 8:41 AM  OK Amox and Benicar Georgina Quint Plotnikov MD  November 28, 2009 12:03 PM   What about denial of Celebrex? Lucious Groves  November 28, 2009 12:13 PM   Pt needs to call her Ins Haematologist and complain Tresa Garter MD  December 01, 2009 11:44 AM   *******Pt CANNOT afford the Benicar over $100. Pt requesting alternative, "Losartan".Marland KitchenMarland KitchenPlease advise...Feb.7/Cheryl Drake  Closed phone note, but asked MD in new one. Lucious Groves  December 02, 2009 9:59 AM   New/Updated Medications: AMOXICILLIN 500 MG CAPS (AMOXICILLIN) 2 caps by mouth bid BENICAR 40 MG TABS (OLMESARTAN MEDOXOMIL) 1 by mouth once daily for blood pressure Prescriptions: BENICAR 40 MG TABS (OLMESARTAN MEDOXOMIL) 1 by mouth once daily for blood pressure  #90 x 3   Entered and  Authorized by:   Tresa Garter MD   Signed by:   Lucious Groves on 11/28/2009   Method used:   Electronically to        [facility] [phone]* (retail)       [address] [place], [place]  [address]       Ph: [phone]       Fax: [phone]   RxID:   [id number] AMOXICILLIN 500 MG CAPS (AMOXICILLIN) 2 caps by mouth bid  #40 x 0   Entered and Authorized by:   Tresa Garter MD   Signed by:   Lucious Groves on 11/28/2009   Method used:   Electronically to        [facility] [phone]* (retail)       [address] [place], [place]  [address]       Ph: [phone]       Fax: [phone]   RxID:   [id number]

## 2010-11-24 NOTE — Assessment & Plan Note (Signed)
Summary: BP IS HIGH/FOR A WHILE / NWS $50   Vital Signs:  Patient Profile:   76 Years Old Female Height:     63 inches Weight:      150 pounds Temp:     98.1 degrees F oral Pulse rate:   76 / minute BP sitting:   164 / 80  (left arm)  Vitals Entered By: Tora Perches (July 19, 2008 4:44 PM)                 Chief Complaint:  Multiple medical problems or concerns.  History of Present Illness: F/u HTN , elev BP at home. An eye Dr said there was a change in the eye consistent with high BP    Current Allergies (reviewed today): ! BARBITURATES ! LASIX ! HYDROCHLOROTHIAZIDE  Past Medical History:    Reviewed history from 10/20/2007 and no changes required:       Hypertension       Low back pain       Osteoarthritis       Osteopenia   Family History:    Reviewed history from 10/20/2007 and no changes required:       Family History Hypertension  Social History:    Reviewed history from 10/25/2007 and no changes required:       Married       Alcohol use-no    Review of Systems  The patient denies chest pain, syncope, dyspnea on exertion, and vision loss.     Physical Exam  General:     Well-developed,well-nourished,in no acute distress; alert,appropriate and cooperative throughout examination Eyes:     No corneal or conjunctival inflammation noted. EOMI. Perrla. Funduscopic exam benign, without hemorrhages, exudates or papilledema. Vision grossly normal. Mouth:     Oral mucosa and oropharynx without lesions or exudates.  Teeth in good repair. Neck:     No deformities, masses, or tenderness noted. Lungs:     Normal respiratory effort, chest expands symmetrically. Lungs are clear to auscultation, no crackles or wheezes. Heart:     Normal rate and regular rhythm. S1 and S2 normal without gallop, murmur, click, rub or other extra sounds. Abdomen:     Bowel sounds positive,abdomen soft and non-tender without masses, organomegaly or hernias noted. Msk:  No deformity or scoliosis noted of thoracic or lumbar spine.   Extremities:     No clubbing, cyanosis, edema, or deformity noted with normal full range of motion of all joints.   Skin:     Intact without suspicious lesions or rashes Psych:     Cognition and judgment appear intact. Alert and cooperative with normal attention span and concentration. No apparent delusions, illusions, hallucinations    Impression & Recommendations:  Problem # 1:  HYPERTENSION (ICD-401.9) Assessment: Deteriorated  The following medications were removed from the medication list:    Verapamil Hcl Cr 240 Mg Tbcr (Verapamil hcl) .Marland Kitchen... 1 by mouth daily  Her updated medication list for this problem includes:    Micardis 80 Mg Tabs (Telmisartan) .Marland Kitchen... 1 by mouth daily   Complete Medication List: 1)  Vitamin D3 1000 Unit Tabs (Cholecalciferol) .Marland Kitchen.. 1 by mouth daily 2)  Micardis 80 Mg Tabs (Telmisartan) .Marland Kitchen.. 1 by mouth daily 3)  Tretinoin 0.1 % Crea (Tretinoin) .... Use qd   Patient Instructions: 1)  Please schedule a follow-up appointment in 3 months. 2)  BMP prior to visit, ICD-9: 3)  TSH prior to visit, ICD-9: 401.1   Prescriptions: TRETINOIN 0.1 % CREA (  TRETINOIN) use qd  #40 g x 3   Entered and Authorized by:   Tresa Garter MD   Signed by:   Tresa Garter MD on 07/19/2008   Method used:   Print then Give to Patient   RxID:   [id number] MICARDIS 80 MG TABS (TELMISARTAN) 1 by mouth daily  #30 x 12   Entered and Authorized by:   Tresa Garter MD   Signed by:   Tresa Garter MD on 07/19/2008   Method used:   Print then Give to Patient   RxID:   [id number]  ]

## 2010-11-24 NOTE — Medication Information (Signed)
Summary: Denial Celebrex/[employer]  Denial Celebrex/[employer]   Imported By: Lester Kemper 12/01/2009 10:26:20  _____________________________________________________________________  External Attachment:    Type:   Image     Comment:   External Document

## 2010-11-24 NOTE — Progress Notes (Signed)
Summary: BENICAR PA  Phone Note Call from Patient   Summary of Call: Per pt, She was told Benicar needs approval. Need to provide pt with samples if PA is needed. I do not see where we have given pt rx. Rx sent in.  Initial call taken by: Lamar Sprinkles, CMA,  July 01, 2009 9:05 AM  Follow-up for Phone Call        Samples ready for pt, pt aware.....................................Marland KitchenLamar Sprinkles, CMA  July 01, 2009 4:59 PM   Per [facility] requires PA. # 1  [phone]. Ok to proceed or change med? Plan covers Avapro, avalide and micardis.........................Marland KitchenLamar Sprinkles, CMA  July 03, 2009 6:11 PM  OK to switch to Avapro Georgina Quint Plotnikov MD  July 04, 2009 8:21 AM   Additional Follow-up for Phone Call Additional follow up Details #1::        LMOVM for pt to call back to advise of samples...samples placed up front.Alvy Beal Archie CMA  July 07, 2009 2:07 PM     Additional Follow-up for Phone Call Additional follow up Details #2::    Pt informed of samples and rx at pharm, will call office if has any problems with med Follow-up by: Lamar Sprinkles, CMA,  July 08, 2009 6:18 PM  New/Updated Medications: AVAPRO 300 MG TABS (IRBESARTAN) 1 by mouth qd Prescriptions: AVAPRO 300 MG TABS (IRBESARTAN) 1 by mouth qd  #90 x 3   Entered by:   Lamar Sprinkles, CMA   Authorized by:   Tresa Garter MD   Signed by:   Lamar Sprinkles, CMA on 07/08/2009   Method used:   Electronically to        [facility] [phone]* (retail)       [address] [place], [place]  [address]       Ph: [phone]       Fax: [phone]   RxID:   [id number] BENICAR 40 MG TABS (OLMESARTAN MEDOXOMIL) once daily  #30 x 6   Entered by:   Lamar Sprinkles, CMA   Authorized by:   Tresa Garter MD   Signed by:   Lamar Sprinkles, CMA on 07/01/2009   Method used:   Electronically to        [facility] [phone]* (retail)       [address] [place], [place]   [address]       Ph: [phone]       Fax: [phone]   RxID:   [id number]

## 2010-11-24 NOTE — Assessment & Plan Note (Signed)
Summary: STILL HAVING TROUBLE WITH ANKLE/NML   Vital Signs:  Patient Profile:   76 Years Old Female Height:     63 inches Weight:      145 pounds Temp:     97.1 degrees F oral Pulse rate:   69 / minute BP sitting:   172 / 76  (left arm) Cuff size:   regular  Vitals Entered By: Orlan Leavens (November 10, 2007 10:54 AM)                 History of Present Illness: The patient presents for a follow up of leg pain, HTN, rash.   Current Allergies (reviewed today): ! BARBITURATES ! LASIX ! HYDROCHLOROTHIAZIDE  Past Medical History:    Reviewed history from 10/20/2007 and no changes required:       Hypertension       Low back pain       Osteoarthritis       Osteopenia  Past Surgical History:    Reviewed history and no changes required:       Lumbar laminectomy   Family History:    Reviewed history from 10/20/2007 and no changes required:       Family History Hypertension  Social History:    Reviewed history from 10/25/2007 and no changes required:       Married       Alcohol use-no     Physical Exam  General:     Well-developed,well-nourished,in no acute distress; alert,appropriate and cooperative throughout examination Head:     Normocephalic and atraumatic without obvious abnormalities. No apparent alopecia or balding. Lungs:     Normal respiratory effort, chest expands symmetrically. Lungs are clear to auscultation, no crackles or wheezes. Heart:     Normal rate and regular rhythm. S1 and S2 normal without gallop, murmur, click, rub or other extra sounds. Abdomen:     Bowel sounds positive,abdomen soft and non-tender without masses, organomegaly or hernias noted. Msk:     L medial malleolus tender.. No redness or swelling Pulses:     R and L carotid,radial,femoral,dorsalis pedis and posterior tibial pulses are full and equal bilaterally Extremities:     No clubbing, cyanosis, edema, or deformity noted with normal full range of motion of all joints.     Neurologic:     No cranial nerve deficits noted. Station and gait are normal. Plantar reflexes are down-going bilaterally. DTRs are symmetrical throughout. Sensory, motor and coordinative functions appear intact. Psych:     Oriented X3.      Impression & Recommendations:  Problem # 1:  LEG PAIN (ICD-729.5) L Assessment: Unchanged Doubt Gout with nl labs. Voltaren gel three times a day , Celebrex 200 mg two times a day samples Pod. cons if not better next wk Orders: T-Ankle Comp Left Min 3 Views (73610TC)   Problem # 2:  HYPERTENSION (ICD-401.9) Assessment: Deteriorated  The following medications were removed from the medication list:    Benicar 40 Mg Tabs (Olmesartan medoxomil) .Marland Kitchen... Take 1 tablet by mouth once a day    Indapamide 2.5 Mg Tabs (Indapamide) .Marland Kitchen... Take 1 by mouth once daily prn  Her updated medication list for this problem includes:    Lotrel 5-40 Mg Caps (Amlodipine besy-benazepril hcl) .Marland Kitchen... 1 by mouth qd   Problem # 3:  RASH AND OTHER NONSPECIFIC SKIN ERUPTION (ICD-782.1) Assessment: Unchanged Salex  6%  Problem # 4:  OSTEOPENIA (ICD-733.90)  The following medications were removed from the medication list:  Evista 60 Mg Tabs (Raloxifene hcl) .Marland Kitchen... Take 1 by mouth once daily did not want to take    Vitamin D3 1000 Unit Tabs (Cholecalciferol) .Marland Kitchen... Take 1 tablet by mouth once a day  Her updated medication list for this problem includes:    Calcium 500/d 500-200 Mg-unit Tabs (Calcium carbonate-vitamin d) .Marland Kitchen... Take 1 by mouth qd  The following medications were removed from the medication list:    Evista 60 Mg Tabs (Raloxifene hcl) .Marland Kitchen... Take 1 by mouth qd    Vitamin D3 1000 Unit Tabs (Cholecalciferol) .Marland Kitchen... Take 1 tablet by mouth once a day  Her updated medication list for this problem includes:    Calcium 500/d 500-200 Mg-unit Tabs (Calcium carbonate-vitamin d) .Marland Kitchen... Take 1 by mouth qd   Complete Medication List: 1)  Fish Oil 1000 Mg Caps (Omega-3  fatty acids) .... Take 1 tablet by mouth once a day 2)  Melatonin 3 Mg Tabs (Melatonin) .... Take 1 tablet by mouth once a day 3)  Calcium 500/d 500-200 Mg-unit Tabs (Calcium carbonate-vitamin d) .... Take 1 by mouth qd 4)  Ultram 50 Mg Tabs (Tramadol hcl) .... Take 1-2 by mouth once daily prn 5)  Lotrel 5-40 Mg Caps (Amlodipine besy-benazepril hcl) .Marland Kitchen.. 1 by mouth qd 6)  Salex 6 % (cream) Kit (Salicylic acid-cleanser) .... Use bid   Patient Instructions: 1)  Please schedule a follow-up appointment in 3 months.    Prescriptions: SALEX 6 % (CREAM)  KIT (SALICYLIC ACID-CLEANSER) use bid  #400g x 3   Entered and Authorized by:   Tresa Garter MD   Signed by:   Tresa Garter MD on 11/10/2007   Method used:   Electronically sent to ...       [facility]  [address] [place], [place]  [address]       Ph: [phone]       Fax: [phone]   RxID:   [id number] LOTREL 5-40 MG  CAPS (AMLODIPINE BESY-BENAZEPRIL HCL) 1 by mouth qd  #30 x 12   Entered and Authorized by:   Tresa Garter MD   Signed by:   Tresa Garter MD on 11/10/2007   Method used:   Electronically sent to ...       [facility]  [address]       [address] [place], [place]  [address]       Ph: [phone]       Fax: [phone]   RxID:   [id number]  ]

## 2010-11-24 NOTE — Progress Notes (Signed)
Summary: RX  Phone Note Call from Patient   Summary of Call: Patient is requesting rx for celebrex for 1 daily to be mailed to her home. She will get rx from [facility].  Initial call taken by: Lamar Sprinkles, CMA,  December 04, 2009 8:17 AM  Follow-up for Phone Call        OK Follow-up by: Tresa Garter MD,  December 04, 2009 12:28 PM  Additional Follow-up for Phone Call Additional follow up Details #1::        Mailed Additional Follow-up by: Lamar Sprinkles, CMA,  December 04, 2009 6:00 PM    New/Updated Medications: CELEBREX 200 MG CAPS (CELECOXIB) 1 by mouth once a day pc as needed for arthritis Prescriptions: CELEBREX 200 MG CAPS (CELECOXIB) 1 by mouth once a day pc as needed for arthritis  #90 x 3   Entered and Authorized by:   Tresa Garter MD   Signed by:   Lamar Sprinkles, CMA on 12/04/2009   Method used:   Print then Give to Patient   RxID:   [id number]

## 2010-11-24 NOTE — Miscellaneous (Signed)
Summary: rx  Clinical Lists Changes  Medications: Added new medication of LORATADINE 10 MG  TABS (LORATADINE) once daily as needed allergies - Signed Rx of LORATADINE 10 MG  TABS (LORATADINE) once daily as needed allergies;  #90 x 3;  Signed;  Entered by: Tresa Garter MD;  Authorized by: Tresa Garter MD;  Method used: Electronically to [facility] [phone]*, [address] [place], [place]  [address], Ph: [phone], Fax: [phone]    Prescriptions: LORATADINE 10 MG  TABS (LORATADINE) once daily as needed allergies  #90 x 3   Entered and Authorized by:   Tresa Garter MD   Signed by:   Tresa Garter MD on 08/01/2008   Method used:   Electronically to        [facility] [phone]* (retail)       [address] [place], [place]  [address]       Ph: [phone]       Fax: [phone]   RxID:   [id number]

## 2010-11-24 NOTE — Assessment & Plan Note (Signed)
Summary: BOIL ON EYE?/AWARE OF FEE/NML   Vital Signs:  Patient Profile:   76 Years Old Female Height:     63 inches Weight:      147 pounds Temp:     97.4 degrees F oral Pulse rate:   69 / minute BP sitting:   148 / 81  (left arm)  Vitals Entered By: Tora Perches (February 01, 2008 8:08 AM)             Is Patient Diabetic? No     Chief Complaint:  Multiple medical problems or concerns.  History of Present Illness: C/o a boil on R upper eyelid x 1 wk, was red around it and down the face. Took 2 Zithro tabs 2 d ago - better. Prior, had a boil in R nostril. No fever.    Current Allergies (reviewed today): ! BARBITURATES ! LASIX ! HYDROCHLOROTHIAZIDE  Past Medical History:    Reviewed history from 10/20/2007 and no changes required:       Hypertension       Low back pain       Osteoarthritis       Osteopenia   Family History:    Reviewed history from 10/20/2007 and no changes required:       Family History Hypertension  Social History:    Reviewed history from 10/25/2007 and no changes required:       Married       Alcohol use-no     Physical Exam  General:     Well-developed,well-nourished,in no acute distress; alert,appropriate and cooperative throughout examination Eyes:     7mm eryth nodule on r eyelid Nose:     External nasal examination shows no deformity or inflammation. Nasal mucosa are pink and moist without lesions or exudates. Mouth:     Oral mucosa and oropharynx without lesions or exudates.  Teeth in good repair. Lungs:     Normal respiratory effort, chest expands symmetrically. Lungs are clear to auscultation, no crackles or wheezes. Heart:     Normal rate and regular rhythm. S1 and S2 normal without gallop, murmur, click, rub or other extra sounds. Abdomen:     Bowel sounds positive,abdomen soft and non-tender without masses, organomegaly or hernias noted. Skin:     Clear    Impression & Recommendations:  Problem # 1:  BOILS,  RECURRENT (ICD-680.9) Assessment: New Doxy 100 mg by mouth two times a day x 10 d  Complete Medication List: 1)  Ultram 50 Mg Tabs (Tramadol hcl) .... Take 1-2 by mouth once daily prn 2)  Salex 6 % (cream) Kit (Salicylic acid-cleanser) .... Use bid 3)  Vitamin D3 1000 Unit Tabs (Cholecalciferol) .Marland Kitchen.. 1 by mouth daily 4)  Fish Oil 1000 Mg Caps (Omega-3 fatty acids) .... Take 1 tablet by mouth once a day 5)  Melatonin 3 Mg Tabs (Melatonin) .... Take 1 tablet by mouth once a day 6)  Doxycycline Hyclate 100 Mg Caps (Doxycycline hyclate) .... Take 1 tab twice a day 7)  Verapamil Hcl Cr 240 Mg Tbcr (Verapamil hcl) .Marland Kitchen.. 1 by mouth daily     Prescriptions: VERAPAMIL HCL CR 240 MG TBCR (VERAPAMIL HCL) 1 by mouth daily  #30 x 12   Entered by:   Tora Perches   Authorized by:   Tresa Garter MD   Signed by:   Tora Perches on 02/01/2008   Method used:   Electronically sent to ...       [facility] [address]       [address] [place], [place]  [address]       Ph: [phone]       Fax: [phone]   RxID:   [id number] VERAPAMIL HCL CR 240 MG TBCR (VERAPAMIL HCL) 1 by mouth daily  #30 x 12   Entered and Authorized by:   Tresa Garter MD   Signed by:   Tresa Garter MD on 02/01/2008   Method used:   Print then Give to Patient   RxID:   [id number] DOXYCYCLINE HYCLATE 100 MG CAPS (DOXYCYCLINE HYCLATE) Take 1 tab twice a day  #20 x 0   Entered and Authorized by:   Tresa Garter MD   Signed by:   Tresa Garter MD on 02/01/2008   Method used:   Print then Give to Patient   RxID:   [id number] DOXYCYCLINE HYCLATE 100 MG CAPS (DOXYCYCLINE HYCLATE) Take 1 tab twice a day  #20 x 0   Entered and Authorized by:   Tresa Garter MD   Signed by:   Tresa Garter MD on 02/01/2008   Method used:   Electronically sent to ...       [address]*       [address] [place], [place]  [address]       Ph: [phone]       Fax:  [phone]   RxID:   [id number]  ]

## 2010-11-24 NOTE — Assessment & Plan Note (Signed)
Summary: GOUT IS NOT BETTER/NWS   Vital Signs:  Patient Profile:   76 Years Old Female Height:     63 inches Weight:      144.25 pounds BMI:     25.65 Temp:     97.7 degrees F oral Pulse rate:   69 / minute BP sitting:   192 / 83  (right arm)  Vitals Entered By: Glendell Docker (October 25, 2007 8:22 AM)                 Chief Complaint:  C/O LEFT FOOT PAIN and Lower Extremity Joint pain.  History of Present Illness:  Lower Extremity Joint Pain      This is a 76 year old woman who presents with Lower Extremity Joint pain.  The patient reports swelling and redness.  The pain is located in the left ankle.  The pain began suddenly.  The pain is described as sharp.  The patient denies the following symptoms: fever.  She was started on indomethacin for presumed gout on previous visit.  Her symptoms improved by not resolved.  Current Allergies (reviewed today): ! BARBITURATES ! LASIX ! HYDROCHLOROTHIAZIDE  Past Medical History:    Reviewed history from 10/20/2007 and no changes required:       Hypertension       Low back pain       Osteoarthritis       Osteopenia   Social History:    Married    Alcohol use-no   Risk Factors:  Alcohol use:  no   Review of Systems      See HPI   Physical Exam  General:     alert, well-developed, and well-nourished.   Neck:     No deformities, masses, or tenderness noted. Lungs:     Normal respiratory effort, chest expands symmetrically. Lungs are clear to auscultation, no crackles or wheezes. Heart:     normal rate and regular rhythm.   Msk:     slight left ankle joint swelling and warmth.      Impression & Recommendations:  Problem # 1:  GOUT, UNSPECIFIED (ICD-274.9) Mild erythema and tenderness of left ankle.  Symptoms improved with indomethacin but not resolved. Prednisone taper 30mg  - 10 mg over 9 days.  Start colchicine.  Check uric acid level.  Her updated medication list for this problem includes:  Colchicine 0.6 Mg Tabs (Colchicine) ..... One by mouth qd  Orders: TLB-Uric Acid, Blood (84550-URIC) TLB-BMP (Basic Metabolic Panel-BMET) (80048-METABOL)    The following medications were removed from the medication list:    Indomethacin 50 Mg Caps (Indomethacin) .Marland Kitchen... 1 by mouth three times a day x 1-2d, then two times a day as needed gout  Her updated medication list for this problem includes:    Colchicine 0.6 Mg Tabs (Colchicine) ..... One by mouth qd   Problem # 2:  HYPERTENSION (ICD-401.9) Assessment: Deteriorated Patient did not take Benicar today.  Patient advised to discontinue NSAIDs.  Her updated medication list for this problem includes:    Benicar 40 Mg Tabs (Olmesartan medoxomil) .Marland Kitchen... Take 1 tablet by mouth once a day  BP today: 192/83 Prior BP: 136/72 (10/20/2007)  Labs Reviewed: Creat: 0.8 (04/07/2007) Chol: 147 (09/21/2006)   HDL: 78.8 (09/21/2006)   LDL: 62 (09/21/2006)   TG: 33 (09/21/2006)   Complete Medication List: 1)  Vitamin D3 1000 Unit Tabs (Cholecalciferol) .... Take 1 tablet by mouth once a day 2)  Benicar 40 Mg Tabs (Olmesartan medoxomil) .Marland KitchenMarland KitchenMarland Kitchen  Take 1 tablet by mouth once a day 3)  Fish Oil 1000 Mg Caps (Omega-3 fatty acids) .... Take 1 tablet by mouth once a day 4)  Vitamin C 500 Mg Tabs (Ascorbic acid) .... Take 1 tablet by mouth once a day 5)  Melatonin 3 Mg Tabs (Melatonin) .... Take 1 tablet by mouth once a day 6)  Ginkgo Biloba 120 Mg Tabs (Ginkgo biloba) .... Take 1 tablet by mouth once a day 7)  Co Q-10 100 Mg Caps (Coenzyme q10) .... Take 1 tablet by mouth once a day 8)  Flaxseed Oil 1000 Mg Caps (Flaxseed (linseed)) .... Take 1 tablet by mouth once a day 9)  Colchicine 0.6 Mg Tabs (Colchicine) .... One by mouth qd 10)  Prednisone 10 Mg Tabs (Prednisone) .... 3 tabs by mouth once daily x 3 days, 2 tabs by mouth once daily x 3 days, 1 tab by mouth once daily x 3 days   Patient Instructions: 1)  Please schedule a follow-up appointment in  1 month with Dr. Posey Rea.    Prescriptions: PREDNISONE 10 MG  TABS (PREDNISONE) 3 tabs by mouth once daily x 3 days, 2 tabs by mouth once daily x 3 days, 1 tab by mouth once daily x 3 days  #18 x 0   Entered and Authorized by:   Dondra Spry DO   Signed by:   Dondra Spry DO on 10/25/2007   Method used:   Electronically sent to ...       [facility]  [address]       [address] [place], [place]  [address]       Ph: [phone]       Fax: [phone]   RxID:   [id number] COLCHICINE 0.6 MG  TABS (COLCHICINE) one by mouth qd  #30 x 1   Entered and Authorized by:   Dondra Spry DO   Signed by:   Dondra Spry DO on 10/25/2007   Method used:   Electronically sent to ...       [facility]  [address]*       [address] [place], [place]  [address]       Ph: [phone]       Fax: [phone]   RxID:   [id number]  ]

## 2010-11-24 NOTE — Consult Note (Signed)
Summary: Removal of Cataracts/[facility]  Removal of Cataracts/[facility]   Imported By: Esmeralda Links D'jimraou 07/29/2008 13:18:19  _____________________________________________________________________  External Attachment:    Type:   Image     Comment:   External Document
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
## 2010-11-24 NOTE — Assessment & Plan Note (Signed)
Summary: 6 MO ROV /NWS  #   Vital Signs:  Patient profile:   76 year old female Height:      63 inches Weight:      148 pounds BMI:     26.31 O2 Sat:      96 % on Room air Temp:     98.1 degrees F oral Pulse rate:   80 / minute Pulse rhythm:   regular Resp:     16 per minute BP sitting:   140 / 90  (left arm) Cuff size:   regular  Vitals Entered By: Lanier Prude, CMA(AAMA) (May 21, 2010 8:04 AM)  O2 Flow:  Room air CC: 6 mo f/u. c/o Rt side neck pain Is Patient Diabetic? No   Primary Care Provider:  Tresa Garter MD  CC:  6 mo f/u. c/o Rt side neck pain.  History of Present Illness: The patient presents for a follow up of hypertension, OA, hyperlipidemia C/o neck pain 2 - 3 wks The patient presents for a wellness examination  Patient past medical history, social history, and family history reviewed in detail no significant changes.  Patient is physically active. Depression is negative and mood is good. Hearing is normal, and able to perform activities of daily living. Risk of falling is negligible and home safety has been reviewed and is appropriate. Patient has normal height, weight, and visual acuity. Patient has been counseled on age-appropriate routine health concerns for screening and prevention. Education, counseling done.    Current Medications (verified): 1)  Vitamin D3 1000 Unit  Tabs (Cholecalciferol) .Marland Kitchen.. 1 By Mouth Daily 2)  Tretinoin 0.1 % Crea (Tretinoin) .... Use Qd 3)  Loratadine 10 Mg  Tabs (Loratadine) .... Once Daily As Needed Allergies 4)  Furosemide 20 Mg Tabs (Furosemide) .... Take 1 Tab By Mouth Every Day As Needed Swelling 5)  Phentermine Hcl 37.5 Mg Tabs (Phentermine Hcl) .Marland Kitchen.. 1 By Mouth Q Am 6)  Potassium Chloride Cr 10 Meq  Tbcr (Potassium Chloride) .Marland Kitchen.. 1 By Mouth Once Daily With Lasix 7)  Celebrex 200 Mg Caps (Celecoxib) .Marland Kitchen.. 1 By Mouth Once A Day Pc As Needed For Arthritis 8)  Diazepam 2 Mg Tabs (Diazepam) .Marland Kitchen.. 1 By Mouth Two Times A  Day As Needed Cramps 9)  Cozaar 100 Mg Tabs (Losartan Potassium) .Marland Kitchen.. 1 By Mouth Qd  Allergies (verified): 1)  ! Barbiturates 2)  ! Lasix 3)  ! Hydrochlorothiazide 4)  Micardis (Telmisartan) 5)  Benicar (Olmesartan Medoxomil) 6)  Avapro (Irbesartan)  Past History:  Past Medical History: Last updated: 10/20/2007 Hypertension Low back pain Osteoarthritis Osteopenia  Past Surgical History: Last updated: 11/10/2007 Lumbar laminectomy  Family History: Last updated: 10/20/2007 Family History Hypertension  Social History: Last updated: 10/25/2007 Married Alcohol use-no  Review of Systems  The patient denies anorexia, fever, weight loss, weight gain, vision loss, decreased hearing, hoarseness, chest pain, syncope, dyspnea on exertion, peripheral edema, prolonged cough, headaches, hemoptysis, abdominal pain, melena, hematochezia, severe indigestion/heartburn, hematuria, incontinence, genital sores, muscle weakness, suspicious skin lesions, transient blindness, difficulty walking, depression, unusual weight change, abnormal bleeding, enlarged lymph nodes, angioedema, and breast masses.         OA  Physical Exam  General:  Well-developed,well-nourished,in no acute distress; alert,appropriate and cooperative throughout examination Head:  Normocephalic and atraumatic without obvious abnormalities. No apparent alopecia or balding. Eyes:  No corneal or conjunctival inflammation noted. EOMI. Perrla.l. Ears:  External ear exam shows no significant lesions or deformities.  Otoscopic examination reveals  clear canals, tympanic membranes are intact bilaterally without bulging, retraction, inflammation or discharge. Hearing is grossly normal bilaterally. Nose:  External nasal examination shows no deformity or inflammation. Nasal mucosa are pink and moist without lesions or exudates. Mouth:  Oral mucosa and oropharynx without lesions or exudates.  Teeth in good repair. Neck:  No deformities,  masses, or tenderness noted. Lungs:  Normal respiratory effort, chest expands symmetrically. Lungs are clear to auscultation, no crackles or wheezes. Heart:  Normal rate and regular rhythm. S1 and S2 normal without gallop, murmur, click, rub or other extra sounds. Abdomen:  Bowel sounds positive,abdomen soft and non-tender without masses, organomegaly or hernias noted. Msk:  No deformity or scoliosis noted of thoracic or lumbar spine.   Pulses:  R and L carotid,radial,femoral,dorsalis pedis and posterior tibial pulses are full and equal bilaterally Neurologic:  No cranial nerve deficits noted. Station and gait are normal. Plantar reflexes are down-going bilaterally. DTRs are symmetrical throughout. Sensory, motor and coordinative functions appear intact. Skin:  Intact without suspicious lesions or rashes Cervical Nodes:  No lymphadenopathy noted Inguinal Nodes:  No significant adenopathy Psych:  Cognition and judgment appear intact. Alert and cooperative with normal attention span and concentration. No apparent delusions, illusions, hallucinations   Impression & Recommendations:  Problem # 1:  PHYSICAL EXAMINATION (ICD-V70.0) Assessment New  Refused colon, mammo after we had a discussion in re: screening tests Overall doing well, age appropriate education and counseling updated and referral for appropriate preventive services done unless declined, immunizations up to date or declined, diet counseling done if overweight, urged to quit smoking if smokes, most recent labs reviewed and current ordered if appropriate, ecg reviewed or declined (interpretation per ECG scanned in the EMR if done); information regarding Medicare Preventation requirements given if appropriate.  The labs were reviewed with the patient.   Orders: First annual wellness visit with prevention plan  ([id number])  Problem # 2:  LOW BACK PAIN (ICD-724.2) Assessment: Unchanged  Her updated medication list for this problem  includes:    Celebrex 200 Mg Caps (Celecoxib) .Marland Kitchen... 1 by mouth once a day pc as needed for arthritis    Cyclobenzaprine Hcl 10 Mg Tabs (Cyclobenzaprine hcl) .Marland Kitchen... 1/2-1 tab by mouth two times a day as needed muscle spasms  Problem # 3:  HYPERTENSION (ICD-401.9) Assessment: Unchanged BP OK at home Her updated medication list for this problem includes:    Furosemide 20 Mg Tabs (Furosemide) .Marland Kitchen... Take 1 tab by mouth every day as needed swelling    Cozaar 100 Mg Tabs (Losartan potassium) .Marland Kitchen... 1 by mouth qd  BP today: 140/90 Prior BP: 136/96 (11/21/2009)  Labs Reviewed: K+: 4.4 (05/19/2010) Creat: : 0.7 (05/19/2010)   Chol: 137 (05/19/2010)   HDL: 65.70 (05/19/2010)   LDL: 65 (05/19/2010)   TG: 34.0 (05/19/2010)  Problem # 4:  GOUT, UNSPECIFIED (ICD-274.9) Assessment: Improved  Problem # 5:  CYSTITIS (ICD-595.9) Assessment: New  Her updated medication list for this problem includes:    Ciprofloxacin Hcl 250 Mg Tabs (Ciprofloxacin hcl) .Marland Kitchen... 1 by mouth two times a day for cystitis  Problem # 6:  NECK PAIN (ICD-723.1) R Assessment: New  Her updated medication list for this problem includes:    Celebrex 200 Mg Caps (Celecoxib) .Marland Kitchen... 1 by mouth once a day pc as needed for arthritis    Cyclobenzaprine Hcl 10 Mg Tabs (Cyclobenzaprine hcl) .Marland Kitchen... 1/2-1 tab by mouth two times a day as needed muscle spasms  Complete Medication List: 1)  Vitamin D3  1000 Unit Tabs (Cholecalciferol) .Marland Kitchen.. 1 by mouth daily 2)  Tretinoin 0.1 % Crea (Tretinoin) .... Use qd 3)  Loratadine 10 Mg Tabs (Loratadine) .... Once daily as needed allergies 4)  Furosemide 20 Mg Tabs (Furosemide) .... Take 1 tab by mouth every day as needed swelling 5)  Phentermine Hcl 37.5 Mg Tabs (Phentermine hcl) .Marland Kitchen.. 1 by mouth q am 6)  Potassium Chloride Cr 10 Meq Tbcr (Potassium chloride) .Marland Kitchen.. 1 by mouth once daily with lasix 7)  Celebrex 200 Mg Caps (Celecoxib) .Marland Kitchen.. 1 by mouth once a day pc as needed for arthritis 8)  Diazepam 2 Mg  Tabs (Diazepam) .Marland Kitchen.. 1 by mouth two times a day as needed cramps 9)  Cozaar 100 Mg Tabs (Losartan potassium) .Marland Kitchen.. 1 by mouth qd 10)  Cyclobenzaprine Hcl 10 Mg Tabs (Cyclobenzaprine hcl) .... 1/2-1 tab by mouth two times a day as needed muscle spasms 11)  Ciprofloxacin Hcl 250 Mg Tabs (Ciprofloxacin hcl) .Marland Kitchen.. 1 by mouth two times a day for cystitis  Other Orders: Pneumococcal Vaccine (34742) Admin 1st Vaccine (59563)  Patient Instructions: 1)  Please schedule a follow-up appointment in 4 months. 2)  Use stretching and balance exercises that I have provided (15 min. or longer every day)  3)  Contour pillow Prescriptions: CIPROFLOXACIN HCL 250 MG TABS (CIPROFLOXACIN HCL) 1 by mouth two times a day for cystitis  #10 x 0   Entered and Authorized by:   Tresa Garter MD   Signed by:   Tresa Garter MD on 05/21/2010   Method used:   Electronically to        [facility] [phone]* (retail)       [address] [place], [place]  [address]       Ph: [phone]       Fax: [phone]   RxID:   [id number] CYCLOBENZAPRINE HCL 10 MG TABS (CYCLOBENZAPRINE HCL) 1/2-1 tab by mouth two times a day as needed muscle spasms  #60 x 3   Entered and Authorized by:   Tresa Garter MD   Signed by:   Tresa Garter MD on 05/21/2010   Method used:   Electronically to        [facility] [phone]* (retail)       [address] [place], [place]  [address]       Ph: [phone]       Fax: [phone]   RxID:   [id number] POTASSIUM CHLORIDE CR 10 MEQ  TBCR (POTASSIUM CHLORIDE) 1 by mouth once daily with lasix  #30 x 12   Entered and Authorized by:   Tresa Garter MD   Signed by:   Tresa Garter MD on 05/21/2010   Method used:   Electronically to        [facility] [phone]* (retail)       [address] [place], [place]  [address]       Ph: [phone]       Fax: [phone]   RxID:   [id number] LORATADINE 10 MG  TABS (LORATADINE) once daily  as needed allergies  #90 x 3   Entered and Authorized by:   Tresa Garter MD   Signed by:   Tresa Garter MD on 05/21/2010   Method used:   Electronically to  [address] [phone]* (retail)       [address] [place], [place]  [address]       Ph: [phone]       Fax: [phone]   RxID:   [id number]    Immunizations Administered:  Pneumonia Vaccine:    Vaccine Type: Pneumovax    Site: left deltoid    Mfr: Merck    Dose: 0.5 ml    Route: IM    Given by: Lanier Prude, CMA(AAMA)    Exp. Date: 11/11/2011    Lot #: [id number]    VIS given: 05/22/96 version given May 21, 2010.

## 2010-11-24 NOTE — Assessment & Plan Note (Signed)
Summary: 3 mos f.u  $50 cd   Vital Signs:  Patient profile:   76 year old female Height:      63 inches Weight:      149 pounds BMI:     26.49 Temp:     97.9 degrees F oral Pulse rate:   72 / minute BP sitting:   146 / 70  (left arm)  Vitals Entered By: Tora Perches (February 12, 2009 8:20 AM) CC: f/u Is Patient Diabetic? No   Primary Care Provider:  Tresa Garter MD  CC:  f/u.  History of Present Illness: The patient is here for a follow up for HTN and hyperlipidemia, Hx of sinus problems and allergies, no fever, the ear fells fool, denies draining, no other symptoms. PB are the same at home as in the office.  every time she eats something salty she swells,  L side sinus still bothering her. C/o fatigue, wt gain  Current Medications (verified): 1)  Vitamin D3 1000 Unit  Tabs (Cholecalciferol) .Marland Kitchen.. 1 By Mouth Daily 2)  Tretinoin 0.1 % Crea (Tretinoin) .... Use Qd 3)  Loratadine 10 Mg  Tabs (Loratadine) .... Once Daily As Needed Allergies 4)  Meloxicam 15 Mg Tabs (Meloxicam) .... One By Mouth Daily Pc Prn 5)  Benicar 40 Mg Tabs (Olmesartan Medoxomil) .... Once Daily  Allergies: 1)  ! Barbiturates 2)  ! Lasix 3)  ! Hydrochlorothiazide 4)  Micardis (Telmisartan)  Past History:  Past Surgical History:    Lumbar laminectomy     (11/10/2007)  Social History:    Married    Alcohol use-no     (10/25/2007)  Risk Factors:    Alcohol Use: N/A    >5 drinks/d w/in last 3 months: N/A    Caffeine Use: N/A    Diet: N/A    Exercise: N/A  Past Medical History:    Reviewed history from 10/20/2007 and no changes required:    Hypertension    Low back pain    Osteoarthritis    Osteopenia PMH-FH-SH reviewed-no changes except otherwise noted  Physical Exam  General:  Well-developed,well-nourished,in no acute distress; alert,appropriate and cooperative throughout examination Ears:  External ear exam shows no significant lesions or deformities.  Otoscopic examination  reveals clear canals, tympanic membranes are intact bilaterally without bulging, retraction, inflammation or discharge. Hearing is grossly normal bilaterally. Hard hearing Nose:  External nasal examination shows no deformity or inflammation. Nasal mucosa are pink and moist without lesions or exudates. Mouth:  Oral mucosa and oropharynx without lesions or exudates.  Teeth in good repair. Lungs:  Normal respiratory effort, chest expands symmetrically. Lungs are clear to auscultation, no crackles or wheezes. Heart:  Normal rate and regular rhythm. S1 and S2 normal without gallop, murmur, click, rub or other extra sounds. Abdomen:  Bowel sounds positive,abdomen soft and non-tender without masses, organomegaly or hernias noted. Msk:  No deformity or scoliosis noted of thoracic or lumbar spine.   Neurologic:  No cranial nerve deficits noted. Station and gait are normal. Plantar reflexes are down-going bilaterally. DTRs are symmetrical throughout. Sensory, motor and coordinative functions appear intact. Skin:  Intact without suspicious lesions or rashes Psych:  Cognition and judgment appear intact. Alert and cooperative with normal attention span and concentration. No apparent delusions, illusions, hallucinations   Impression & Recommendations:  Problem # 1:  CRAMPS,LEG (ICD-729.82) Assessment New Change diuretic  Problem # 2:  HYPERTENSION (ICD-401.9) Assessment: Comment Only  Her updated medication list for this problem includes:  Benicar 40 Mg Tabs (Olmesartan medoxomil) ..... Once daily    Furosemide 20 Mg Tabs (Furosemide) .Marland Kitchen... Take 1 tab by mouth every day as needed swelling  Problem # 3:  WEIGHT GAIN (ICD-783.1) Phentermine Rx given -she used it before w/success Risks vs benefits and controversies of a long term appetite suppressant use were discussed.  Problem # 4:  EDEMA (ICD-782.3) Assessment: Comment Only  Her updated medication list for this problem includes:    Furosemide  20 Mg Tabs (Furosemide) .Marland Kitchen... Take 1 tab by mouth every day as needed swelling  Complete Medication List: 1)  Vitamin D3 1000 Unit Tabs (Cholecalciferol) .Marland Kitchen.. 1 by mouth daily 2)  Tretinoin 0.1 % Crea (Tretinoin) .... Use qd 3)  Loratadine 10 Mg Tabs (Loratadine) .... Once daily as needed allergies 4)  Meloxicam 15 Mg Tabs (Meloxicam) .... One by mouth daily pc prn 5)  Benicar 40 Mg Tabs (Olmesartan medoxomil) .... Once daily 6)  Furosemide 20 Mg Tabs (Furosemide) .... Take 1 tab by mouth every day as needed swelling 7)  Phentermine Hcl 37.5 Mg Tabs (Phentermine hcl) .Marland Kitchen.. 1 by mouth q am 8)  Potassium Chloride Cr 10 Meq Tbcr (Potassium chloride) .Marland Kitchen.. 1 by mouth once daily with lasix  Patient Instructions: 1)  Please schedule a follow-up appointment in 3 months. Prescriptions: POTASSIUM CHLORIDE CR 10 MEQ  TBCR (POTASSIUM CHLORIDE) 1 by mouth once daily with lasix  #30 x 6   Entered and Authorized by:   Tresa Garter MD   Signed by:   Tresa Garter MD on 02/12/2009   Method used:   Print then Give to Patient   RxID:   [id number] PHENTERMINE HCL 37.5 MG TABS (PHENTERMINE HCL) 1 by mouth q am  #30 x 1   Entered and Authorized by:   Tresa Garter MD   Signed by:   Tresa Garter MD on 02/12/2009   Method used:   Print then Give to Patient   RxID:   [id number] FUROSEMIDE 20 MG TABS (FUROSEMIDE) Take 1 tab by mouth every day as needed swelling  #30 x 6   Entered and Authorized by:   Tresa Garter MD   Signed by:   Tresa Garter MD on 02/12/2009   Method used:   Print then Give to Patient   RxID:   [id number]

## 2010-11-26 ENCOUNTER — Encounter: Payer: Self-pay | Admitting: Internal Medicine

## 2010-11-26 NOTE — Assessment & Plan Note (Signed)
Summary: 6 mos f/u #/ cd   Vital Signs:  Patient profile:   76 year old female Height:      63 inches Weight:      146 pounds BMI:     25.96 Temp:     97.9 degrees F oral Pulse rate:   92 / minute Pulse rhythm:   regular Resp:     16 per minute BP sitting:   130 / 70  (left arm) Cuff size:   regular  Vitals Entered By: Lanier Prude, CMA(AAMA) (November 19, 2010 8:04 AM) CC: 6 mo f/u  Is Patient Diabetic? No   Primary Care Provider:  Tresa Garter MD  CC:  6 mo f/u .  History of Present Illness: The patient presents for a follow up of hypertension, OA, leg cramps  Current Medications (verified): 1)  Vitamin D3 1000 Unit  Tabs (Cholecalciferol) .Marland Kitchen.. 1 By Mouth Daily 2)  Tretinoin 0.1 % Crea (Tretinoin) .... Use Qd 3)  Loratadine 10 Mg  Tabs (Loratadine) .... Once Daily As Needed Allergies 4)  Furosemide 20 Mg Tabs (Furosemide) .... Take 1 Tab By Mouth Every Day As Needed Swelling 5)  Phentermine Hcl 37.5 Mg Tabs (Phentermine Hcl) .Marland Kitchen.. 1 By Mouth Q Am 6)  Potassium Chloride Cr 10 Meq  Tbcr (Potassium Chloride) .Marland Kitchen.. 1 By Mouth Once Daily With Lasix 7)  Celebrex 200 Mg Caps (Celecoxib) .Marland Kitchen.. 1 By Mouth Once A Day Pc As Needed For Arthritis 8)  Diazepam 2 Mg Tabs (Diazepam) .Marland Kitchen.. 1 By Mouth Two Times A Day As Needed Cramps 9)  Cozaar 100 Mg Tabs (Losartan Potassium) .Marland Kitchen.. 1 By Mouth Qd 10)  Cyclobenzaprine Hcl 10 Mg Tabs (Cyclobenzaprine Hcl) .... 1/2-1 Tab By Mouth Two Times A Day As Needed Muscle Spasms  Allergies (verified): 1)  ! Barbiturates 2)  ! Lasix 3)  ! Hydrochlorothiazide 4)  ! Losartan Potassium (Losartan Potassium) 5)  Micardis (Telmisartan) 6)  Benicar (Olmesartan Medoxomil) 7)  Avapro (Irbesartan)  Past History:  Past Medical History: Last updated: 10/20/2007 Hypertension Low back pain Osteoarthritis Osteopenia  Social History: Last updated: 10/25/2007 Married Alcohol use-no  Physical Exam  General:  Well-developed,well-nourished,in no  acute distress; alert,appropriate and cooperative throughout examination Eyes:  No corneal or conjunctival inflammation noted. EOMI. Perrla.l. Ears:  External ear exam shows no significant lesions or deformities.  Otoscopic examination reveals clear canals, tympanic membranes are intact bilaterally without bulging, retraction, inflammation or discharge. Hearing is grossly normal bilaterally. Mouth:  Oral mucosa and oropharynx without lesions or exudates.  Teeth in good repair. Neck:  No deformities, masses, or tenderness noted. Lungs:  Normal respiratory effort, chest expands symmetrically. Lungs are clear to auscultation, no crackles or wheezes. Heart:  Normal rate and regular rhythm. S1 and S2 normal without gallop, murmur, click, rub or other extra sounds. Abdomen:  Bowel sounds positive,abdomen soft and non-tender without masses, organomegaly or hernias noted. Msk:  No deformity or scoliosis noted of thoracic or lumbar spine.   Extremities:  No clubbing, cyanosis, edema, or deformity noted with normal full range of motion of all joints.   Neurologic:  No cranial nerve deficits noted. Station and gait are normal. Plantar reflexes are down-going bilaterally. DTRs are symmetrical throughout. Sensory, motor and coordinative functions appear intact. Skin:  Intact without suspicious lesions or rashes Psych:  Cognition and judgment appear intact. Alert and cooperative with normal attention span and concentration. No apparent delusions, illusions, hallucinations   Impression & Recommendations:  Problem # 1:  OSTEOARTHRITIS (ICD-715.90) Assessment Unchanged  Her updated medication list for this problem includes:    Celebrex 200 Mg Caps (Celecoxib) .Marland Kitchen... 1 by mouth once a day pc as needed for arthritis  Problem # 2:  LOW BACK PAIN (ICD-724.2) Assessment: Unchanged  Her updated medication list for this problem includes:    Celebrex 200 Mg Caps (Celecoxib) .Marland Kitchen... 1 by mouth once a day pc as needed  for arthritis    Cyclobenzaprine Hcl 10 Mg Tabs (Cyclobenzaprine hcl) .Marland Kitchen... 1/2-1 tab by mouth two times a day as needed muscle spasms  Problem # 3:  CRAMPS,LEG (ICD-729.82) Assessment: Unchanged D/c HCTZ  Problem # 4:  HYPERTENSION (ICD-401.9) Assessment: Unchanged Unable to Tolerated well. Complicatons - none. Good pain relief following the procedure. other meds but Benicar The following medications were removed from the medication list:    Cozaar 100 Mg Tabs (Losartan potassium) .Marland Kitchen... 1 by mouth qd Her updated medication list for this problem includes:    Furosemide 20 Mg Tabs (Furosemide) .Marland Kitchen... Take 1 tab by mouth every day as needed swelling    Benicar 40 Mg Tabs (Olmesartan medoxomil) .Marland Kitchen... 1 by mouth once daily for blood pressure  Complete Medication List: 1)  Vitamin D3 1000 Unit Tabs (Cholecalciferol) .Marland Kitchen.. 1 by mouth daily 2)  Tretinoin 0.1 % Crea (Tretinoin) .... Use qd 3)  Loratadine 10 Mg Tabs (Loratadine) .... Once daily as needed allergies 4)  Furosemide 20 Mg Tabs (Furosemide) .... Take 1 tab by mouth every day as needed swelling 5)  Phentermine Hcl 37.5 Mg Tabs (Phentermine hcl) .Marland Kitchen.. 1 by mouth q am 6)  Potassium Chloride Cr 10 Meq Tbcr (Potassium chloride) .Marland Kitchen.. 1 by mouth once daily with lasix 7)  Celebrex 200 Mg Caps (Celecoxib) .Marland Kitchen.. 1 by mouth once a day pc as needed for arthritis 8)  Diazepam 2 Mg Tabs (Diazepam) .Marland Kitchen.. 1 by mouth two times a day as needed cramps 9)  Cyclobenzaprine Hcl 10 Mg Tabs (Cyclobenzaprine hcl) .... 1/2-1 tab by mouth two times a day as needed muscle spasms 10)  Benicar 40 Mg Tabs (Olmesartan medoxomil) .Marland Kitchen.. 1 by mouth once daily for blood pressure  Patient Instructions: 1)  Please schedule a follow-up appointment in 6 months well w/labs. Prescriptions: TRETINOIN 0.1 % CREA (TRETINOIN) use qd  #40 g x 3   Entered and Authorized by:   Tresa Garter MD   Signed by:   Tresa Garter MD on 11/19/2010   Method used:   Print then Give  to Patient   RxID:   [id number] LORATADINE 10 MG  TABS (LORATADINE) once daily as needed allergies  #90 x 3   Entered and Authorized by:   Tresa Garter MD   Signed by:   Tresa Garter MD on 11/19/2010   Method used:   Print then Give to Patient   RxID:   [id number] PHENTERMINE HCL 37.5 MG TABS (PHENTERMINE HCL) 1 by mouth q am  #30 x 2   Entered and Authorized by:   Tresa Garter MD   Signed by:   Tresa Garter MD on 11/19/2010   Method used:   Print then Give to Patient   RxID:   [id number] BENICAR 40 MG TABS (OLMESARTAN MEDOXOMIL) 1 by mouth once daily for blood pressure  #30 x 12   Entered and Authorized by:   Tresa Garter MD   Signed by:   Tresa Garter MD on 11/19/2010   Method used:  Print then Give to Patient   RxID:   [id number]    Orders Added: 1)  Est. Patient Level IV [56387]

## 2010-11-28 ENCOUNTER — Encounter: Payer: Self-pay | Admitting: Internal Medicine

## 2010-12-07 ENCOUNTER — Telehealth (INDEPENDENT_AMBULATORY_CARE_PROVIDER_SITE_OTHER): Payer: Self-pay | Admitting: *Deleted

## 2010-12-08 ENCOUNTER — Telehealth: Payer: Self-pay | Admitting: Internal Medicine

## 2010-12-10 NOTE — Medication Information (Signed)
Summary: Prior [employer] for Benicar/[employer]  Prior [employer] for Benicar[employer]   Imported By: Sherian Rein 12/02/2010 10:13:29  _____________________________________________________________________  External Attachment:    Type:   Image     Comment:   External Document

## 2010-12-10 NOTE — Progress Notes (Signed)
Summary: Benicar PA-denied  ---- Converted from flag ---- ---- 11/19/2010 8:17 AM, Georgina Quint Ahjanae Cassel MD wrote: Misty Stanley, please call Formulary Exception Request [phone] and approve Benicar. She had problems with Avapro, Micardis, Losartan   Thanks, AP ------------------------------       Additional Follow-up for Phone Call Additional follow up Details #2::    called number listed above to obtain PA form.  Submitted info and office fax number. Will wait for form. Lanier Prude, [facility]),  November 20, 2010 10:42 AM  Still have not rec form. Called above number again to obtain PA or Form. Gave all pertinent info to [place] and he is faxing a form to be completed. Follow-up by: Lanier Prude, [facility]),  November 26, 2010 2:28 PM  Additional Follow-up for Phone Call Additional follow up Details #3:: Details for Additional Follow-up Action Taken: PA form completed/faxed to [facility] Lanier Prude, [facility])  November 26, 2010 4:34 PM   Rec deniel for Benicar because failure to Avapro, Diovan and losartan must be documented before an override will be granted.   I only stated on the form that pt had tried/failed with Avapro, Micardis and Losartan. Please advise will Diovan be appropriate??  Lanier Prude, [facility])  November 30, 2010 8:57 AM  Pls inform the pt - she will  need to try other meds per her Ins co Thank you!  Georgina Quint Micharl Helmes MD  November 30, 2010 1:36 PM    left mess for pt to call back  Lanier Prude, [facility])  November 30, 2010 2:29 PM   She will have to try Diovan 320 mg 1 a day - samples if we have Thank you!  Georgina Quint Zakariya Knickerbocker MD  December 01, 2010 10:36 AM   Pt informed of new rx.Samples provided............Marland KitchenLamar Sprinkles, CMA  December 03, 2010 5:55 PM   New/Updated Medications: DIOVAN 320 MG TABS (VALSARTAN) 1 by mouth once daily for blood pressure Prescriptions: DIOVAN 320 MG TABS (VALSARTAN) 1 by mouth once daily for blood pressure  #30 x  11   Entered by:   Lamar Sprinkles, CMA   Authorized by:   Tresa Garter MD   Signed by:   Lamar Sprinkles, CMA on 12/03/2010   Method used:   Electronically to        [facility] [phone]* (retail)       [address] [place], [place]  [address]       Ph: [phone]       Fax: [phone]   RxID:   [id number]  Pt states she has not tried Diovan and would like to try that if you would advise./Ami Bullins CMA  December 01, 2010 9:24 AM

## 2010-12-13 ENCOUNTER — Encounter: Payer: Self-pay | Admitting: Internal Medicine

## 2010-12-16 NOTE — Progress Notes (Signed)
Summary: Diovan ?  Phone Note Call from Patient   Caller: Patient Summary of Call: pt called stating she will not take Diovan 320mg . She only wants 80mg . Please advise Initial call taken by: Lanier Prude, [facility]),  December 08, 2010 1:34 PM  Follow-up for Phone Call        OK 80 mg once daily but it is not gonna be enuogh most likely Follow-up by: Tresa Garter MD,  December 08, 2010 2:09 PM  Additional Follow-up for Phone Call Additional follow up Details #1::        Pt informed  Additional Follow-up by: Lamar Sprinkles, CMA,  December 08, 2010 6:10 PM    New/Updated Medications: DIOVAN 80 MG TABS (VALSARTAN) 1 once daily Prescriptions: DIOVAN 80 MG TABS (VALSARTAN) 1 once daily  #30 x 5   Entered by:   Lamar Sprinkles, CMA   Authorized by:   Tresa Garter MD   Signed by:   Lamar Sprinkles, CMA on 12/08/2010   Method used:   Electronically to        [facility] [phone]* (retail)       [address] [place], [place]  [address]       Ph: [phone]       Fax: [phone]   RxID:   [id number]

## 2010-12-22 NOTE — Medication Information (Signed)
Summary: Prior Authorization & Approved Tretinoin / [employer]  Prior Authorization & Approved Tretinoin / [employer]   Imported By: Lennie Odor 12/16/2010 12:25:53  _____________________________________________________________________  External Attachment:    Type:   Image     Comment:   External Document

## 2010-12-22 NOTE — Progress Notes (Signed)
Summary: Pa-Tretinoin  Phone Note From Pharmacy   Summary of Call: PA-Tretinoin called [facility] @ [phone]. Form to Dr Posey Rea to complete. Dagoberto Reef  December 07, 2010 4:59 PM PA Approved  until 12/16/11, pt aware. Initial call taken by: Dagoberto Reef,  December 15, 2010 3:15 PM  Follow-up for Phone Call        Treninoin requires PA - are there any alternatives pt has tried or do you want to change med?  Follow-up by: Lamar Sprinkles, CMA,  December 07, 2010 5:29 PM  Additional Follow-up for Phone Call Additional follow up Details #1::        What are the alternatives? Additional Follow-up by: Tresa Garter MD,  December 07, 2010 5:40 PM    Additional Follow-up for Phone Call Additional follow up Details #2::    [facility] spoke with [place] at [phone], no alternatives avail. must complete a formulary exception form. Form received and pending.Marland KitchenMarland KitchenAlvy Beal Archie CMA  December 10, 2010 10:39 AM

## 2011-01-12 ENCOUNTER — Encounter: Payer: Self-pay | Admitting: Internal Medicine

## 2011-01-12 ENCOUNTER — Ambulatory Visit (INDEPENDENT_AMBULATORY_CARE_PROVIDER_SITE_OTHER)
Admission: RE | Admit: 2011-01-12 | Discharge: 2011-01-12 | Disposition: A | Discharge status: Home / Self Care | Payer: Medicare Other | Source: Ambulatory Visit | Attending: Internal Medicine | Admitting: Internal Medicine

## 2011-01-12 ENCOUNTER — Other Ambulatory Visit: Payer: Self-pay

## 2011-01-12 ENCOUNTER — Ambulatory Visit (INDEPENDENT_AMBULATORY_CARE_PROVIDER_SITE_OTHER): Payer: Medicare Other | Admitting: Internal Medicine

## 2011-01-12 ENCOUNTER — Telehealth: Payer: Self-pay | Admitting: *Deleted

## 2011-01-12 VITALS — BP 164/120 | HR 82 | Temp 98.5°F | Resp 16 | Ht 64.0 in | Wt 143.0 lb

## 2011-01-12 DIAGNOSIS — S8002XA Contusion of left knee, initial encounter: Secondary | ICD-10-CM

## 2011-01-12 DIAGNOSIS — S8000XA Contusion of unspecified knee, initial encounter: Secondary | ICD-10-CM

## 2011-01-12 MED ORDER — IBUPROFEN 600 MG PO TABS: 600.0000 mg | ORAL_TABLET | Freq: Three times a day (TID) | ORAL | Status: AC | PRN

## 2011-01-12 NOTE — Patient Instructions (Signed)
Use ice every 2 hrs while awake x 24 hrs, then heat

## 2011-01-12 NOTE — Progress Notes (Signed)
  Subjective:    Patient ID: Erin [name], female    DOB: [birth year], 76 y.o.   MRN: [id number]  HPI  C/o L knee pain after a fall 2 hrs ago at work  Review of Systems  Cardiovascular: Negative for chest pain.       Objective:   Physical Exam  Constitutional: She appears well-developed.  Musculoskeletal:       Left knee: She exhibits decreased range of motion, swelling and ecchymosis. She exhibits no effusion, no deformity and no erythema. tenderness found. Medial joint line tenderness noted.  Skin: Skin is warm.          Assessment & Plan:  L knee contusion - acute------------- Xray. Ibuprofen 600 mg prn  X ray with patella fracture - Ortho consult

## 2011-01-12 NOTE — Telephone Encounter (Signed)
Radiology called - pt has fracture of patella. PER MD pt needs ortho apt asap. Called [place] [facility] and scheduled an apt with Dr Thomasena Edis tomorrow at 1:00. She must come into the office and pick up copy of xray prior to apt. Left mess to call office back.

## 2011-01-12 NOTE — Telephone Encounter (Signed)
Pt's husband informed of apt

## 2011-01-13 NOTE — Telephone Encounter (Signed)
Thx

## 2011-01-21 NOTE — Medication Information (Signed)
Summary: Benicar denied/[employer]  Benicar denied/[employer]   Imported By: Sherian Rein 12/21/2010 12:51:20  _____________________________________________________________________  External Attachment:    Type:   Image     Comment:   External Document

## 2011-03-12 NOTE — Op Note (Signed)
   NAME:  DENAYA, HORN                         ACCOUNT NO.:  [id number]   MEDICAL RECORD NO.:  [id number]                   PATIENT TYPE:  OIB   LOCATION:  2857                                 FACILITY:  [facility]   PHYSICIAN:  Stefani Dama, M.D.               DATE OF BIRTH:  1935/09/17   DATE OF PROCEDURE:  01/14/2003  DATE OF DISCHARGE:  01/14/2003                                 OPERATIVE REPORT   PREOPERATIVE DIAGNOSIS:  Frontal vertex scalp lesion.   POSTOPERATIVE DIAGNOSIS:  Frontal vertex scalp lesion (sebaceous cyst).   OPERATION PERFORMED:  Resection of frontal scalp lesion.   SURGEON:  Stefani Dama, M.D.   ANESTHESIA:  General endotracheal.   INDICATIONS FOR PROCEDURE:  The patient is a well-known to my practice  having had previous lumbar back surgery.  She developed a lesion on the  vertex of her scalp which has become painful and sore for a number of  months.  Despite conservative effort, it has not relieved itself.  It is  believed to be a sebaceous cyst.  She is now taken to the operating room to  undergo surgical removal of this lesion.   DESCRIPTION OF PROCEDURE:  The patient was brought to the operating room.  After smooth induction of general anesthesia with laryngeal face mask, the  area around the lesion was shaved, prepped with DuraPrep and draped in a  sterile fashion.  Then, a Z-plasty incision was made on either side of the  lesion ellipsing the lesion completely.  It was removed and resected in a  single piece down to the galea. The lesion was sent for pathologic  evaluation.  Then the subgaleal tissues were undermined to allow loosening  so that a closure of the Z-plasty could be performed.  This was performed  with 3-0 Vicryl sutures in a subgaleal layer and then the skin was closed  with 3-0 nylon in a figure-of-eight fashion.  The patient tolerated the  procedure well.  She was returned to the recovery room in stable condition  for discharge  later today.                                                Stefani Dama, M.D.    Merla Riches  D:  01/15/2003  T:  01/15/2003  Job:  [id number]

## 2011-03-12 NOTE — H&P (Signed)
[place]  Patient:    Erin [name], TREW Visit Number: [id number] MRN: [id number]          Service Type: SUR Location: 5000 5032 01 Attending Physician:  Jonne Ply Dictated by:   Stefani Dama, M.D. Admit Date:  05/14/2002                           History and Physical  ADMITTING DIAGNOSES:  Left L3 radiculopathy, spondylolisthesis L3-L4.  HISTORY OF PRESENT ILLNESS:  The patient is a 76 year old individual who was hospitalized just last week with the presence of a left lumbar radiculopathy in the L3 distribution.  An MRI was performed prior to her admission at that time which demonstrated she had a degenerative spondylolisthesis.  She was treated conservatively.  She was discharged home.  Seemed to be doing better but a day later she suffered a fall down some three steps at her home and she notes that the pain was much more severe and intense.  She has been bed resting for the past weekend but notes that she is essentially as incapacitated as she was at the time of her initial hospitalization.  At this time she has a great difficulty with weightbearing on her left lower extremity.  She was carried into the office today by her husband and her son-in-law.  Her other past medical history is significant that she generally has good health.  She has some hypertension.  She has a cholecystectomy and appendectomy as previous surgical procedures.  ALLERGIES:  She notes an allergy to DEMEROL.  MEDICATIONS: 1. Verapamil 240 mg q.d. for high blood pressure. 2. Cyclobenzaprine as a muscle relaxer. 3. She had been using a Medrol dose pack, but had stopped this on day #3.  REVIEW OF SYSTEMS:  Notable for wearing of glasses, high blood pressure, leg pain while walking, weakness in both legs, back pain, joint pain, swelling, blood in the urine noted on the urinalysis in the emergency room, change in bowel habits with constipation.  SOCIAL  HISTORY:  The patient does not smoke.  She does not drink alcohol. Height and weight have been stable at 135 pounds, 5 feet 3 inches in height.  PHYSICAL EXAMINATION  NEUROLOGIC:  She is an alert individual in severe distress.  She tends to be lying flat on her back in the office today with a pillow under her knees.  Her left leg is maintained flexed.  She will not lie the leg flat on the examination room table.  She will straight leg raise to 30 degrees beyond which she has significant leg pain.  The quadriceps strength on the left side is weak to 4/5.  No gross atrophy is noted; however, there is tenderness to palpation of the muscle itself.  Sensation is diminished on the left anterior quarter of the thigh to pin and vibration.  Otherwise, is intact in the distal lower extremities.  Tibialis, anterior, gastrocnemius, iliopsoas strength appears to be intact.  Palpation and percussion across the lower lumbar spine does not reproduce any significant acute pain or tenderness.  Motor strength in the upper extremities is normal.  Cranial nerve examination reveals the pupils are 3 mm, briskly reactive to light and accommodation.  Extraocular movements are full.  Face is symmetric to grimace.  Tongue and uvula are in the midline.  Sclerae and conjunctivae are clear.  Facial sensation is intact.  NECK:  No masses and no  bruits are heard.  LUNGS:  Clear to auscultation.  HEART:  Regular rate and rhythm.  ABDOMEN:  Soft.  Bowel sounds positive.  No masses are palpable.  EXTREMITIES:  No clubbing, cyanosis, edema.  IMPRESSION:  The patient has evidence of degenerative spondylolisthesis at the L3-L4 level with a left lumbar radiculopathy in an L3 distribution.  Pain is worse since the recent fall.  She is now being admitted to undergo myelography.  Pain control measures will also be employed. Dictated by:   Stefani Dama, M.D. Attending Physician:  Jonne Ply DD:  05/14/02 TD:   05/17/02 Job: [id number]

## 2011-03-12 NOTE — Discharge Summary (Signed)
   NAME:  Erin [name], Erin [name]                         ACCOUNT NO.:  [id number]   MEDICAL RECORD NO.:  [id number]                   PATIENT TYPE:  INP   LOCATION:  5032                                 FACILITY:  [facility]   PHYSICIAN:  Stefani Dama, M.D.               DATE OF BIRTH:  September 13, 1935   DATE OF ADMISSION:  05/14/2002  DATE OF DISCHARGE:  05/17/2002                                 DISCHARGE SUMMARY   ADMITTING DIAGNOSES:  1. Lumbar radiculopathy on left with L3 radicular pain.  2. Herniated nucleus pulposus of L3-4 left with extra foraminal fragment of     disc.   DISCHARGE DIAGNOSES:  1. Lumbar radiculopathy on left with L3 radicular pain.  2. Herniated nucleus pulposus of L3-4 left with extra foraminal fragment of     disc.   CONDITION ON DISCHARGE:  Stable.   HOSPITAL COURSE:  The patient is a 76 year old individual who was admitted a  week before with a severe left L3 radiculopathy.  She was treated with an  epidural steroid injection, as an MRI demonstrated that there was some  slight compromise of her left L3 foramen.  The patient had a fall after  discharge at home and then she was unable to ambulate.  She was readmitted  to the hospital because of severe intractable pain and a myelogram was  performed on 05/14/2002.  The myelogram itself did not reveal any  significant nerve root compression.  However, the postoperative CAT scan  demonstrated that there was a large extra foraminal fragment of disc at L3-  L4, causing L3 nerve root compression.  She was taken to the operating room  on 05/15/2002, where she underwent an extra foraminal discectomy.  She  tolerated this procedure well and had reasonable initial relief of pain.  However, the patient had continued complaints of back pain and was slow to  mobilize.  On 05/17/2002, however, she was feeling somewhat better.  She  still had some pain in the region of the left knee.  Motor strength seemed  to be improving  particularly in the iliopsoas and the quad and the patient  was then discharged home.  She was maintained on Neurontin, which she had  been on prior to her admission.  She will be seen in the office in three  weeks time for further follow up.                                                 Stefani Dama, M.D.    Merla Riches  D:  06/07/2002  T:  06/11/2002  Job:  [id number]

## 2011-03-12 NOTE — Op Note (Signed)
[place]. [facility]  Patient:    Erin [name], Erin [name] Visit Number: [id number] MRN: [id number]          Service Type: SUR Location: 5000 5032 01 Attending Physician:  Jonne Ply Dictated by:   Stefani Dama, M.D. Proc. Date: 05/15/02 Admit Date:  05/14/2002 Discharge Date: 05/17/2002                             Operative Report  PREOPERATIVE DIAGNOSIS:  L3 and L4 herniated nucleus pulposus, left with left lumbar radiculopathy.  POSTOPERATIVE DIAGNOSIS:  L3 and L4 herniated nucleus pulposus, left with left lumbar radiculopathy.  PROCEDURE:  Extraforaminal diskectomy of L3-4, left with Met-Rx system and operating microscope microdissection technique.  SURGEON:  Stefani Dama, M.D.  FIRST ASSISTANT:  Kyle L. Franky Macho, M.D.  ANESTHESIA:  General endotracheal.  INDICATIONS:  The patient is a 76 year old individual who has had significant back and left lower extremity pain.  She has been troubled with this for several weeks yesterday and a myelogram was performed which demonstrated a large extraforaminal disk herniation at L3-4.  The patient had been troubled with left lumbar radiculopathy and has been essentially immobile.  She was advised regarding surgery.  DESCRIPTION OF PROCEDURE:  The patient was brought to the operating room supine on the stretcher.  After smooth induction of general endotracheal anesthesia, she was turned prone.  The back was shaved and prepped with DuraPrep, and draped in a sterile fashion.  After localizing the L3-4 region on the left side with fluoroscopy, a wire was passed to laminar arch of L3 using a wanding technique.  A series of dilators was placed so as to place a 5 cm deep x 18 mm wide endoscopic cannula.  The cannula was affixed to the bed.  The dissection was then continued by placing the microscope over this aperture and through the aperture, clearing soft tissues above the L3 lamina. The pars region  of the L3-4 space was identified.  There was noted to be a significantly hypertrophied osteophyte from the facet of L3-4 below.  This was taken down with a high speed air drill and 2.8 mm dissecting tool.  Then, by further dissection, the pars was thinned.  The fascia over this area was cleared and the L3 nerve root was identified in excellent position.  Then, care was taken to protect the further dissection along the inferior portion of the pars in the region of the foramen was undertaken.  A large mass underneath this area was encountered.  Further clearing of this using the 2 mm and 3 mm Kerrison punch, placed a small hole in the lateral aspect of the dura.  This was protected with a cottonoid pad.  The procedure was continued and then a diskectomy was performed in the extraforaminal space at L3-4.  A significant quantity of markedly degenerative disk material was retrieved from the subligamentous space.  This allowed for flattening and easier passage of the L3 nerve root at the exit foramen.  The diskectomy was completed using a combination of curets and rongeurs both medially and laterally.  Microdissection technique was employed for this process.  Dr. Franky Macho helped by providing some retraction over the dura in the region of the spinal fluid leak.  Some spinal fluid was noted to leak from this area. Epidural venous bleeding was also encountered.  This was somewhat more difficult to control as the dura was decompressed from  the spinal fluid.  In the end, what was noted was that there was a small hole in the lateral underside of the dura which was not accessible to be primarily closed.  Once the dura had decompressed itself, the leak seemed to close on its own. However, because of the presence of this, it was decided to seal it with a fibrin glue.  This was prepared and then placed over the spinal fluid opening. The procedure was then completed by checking hemostasis in the wound.   The endoscopic cannula was removed and the fascia was closed with 3-0 Vicryl in an interrupted fashion.  A 3-0 Vicryl was used to close the subcuticular tissues and Dermabond was placed on the skin.  The patient tolerated the procedure well and was returned to the recovery room in stable condition. Dictated by:   Stefani Dama, M.D. Attending Physician:  Jonne Ply DD:  05/15/02 TD:  05/18/02 Job: [id number]

## 2011-03-12 NOTE — Discharge Summary (Signed)
[place]. [facility]  Patient:    Erin [name], Erin [name] Visit Number: [id number] MRN: [id number]          Service Type: MED Location: 3000 3015 01 Attending Physician:  Jonne Ply Dictated by:   Stefani Dama, M.D. Admit Date:  05/03/2002 Discharge Date: 05/08/2002   CC:         Sonda Primes, M.D.   Discharge Summary  ADMISSION DIAGNOSES: 1. Lumbar spondylolisthesis with lumbar radiculopathy. 2. Intractable back pain.  CONDITION ON DISCHARGE:  Improving.  DISCHARGE DIAGNOSES: 1. Lumbar spondylolisthesis with lumbar radiculopathy. 2. Intractable back pain.  OPERATION:  None.  PROCEDURES:  Lumbar epidural steroid injection.  HOSPITAL COURSE:  The patient is a 76 year old individual who suffered five days of intractable back pain at home.  She was unable to ambulate, unable to bear weight on either lower extremity, particularly on her left.  I had seen the patient in the office.  She was brought by ambulance and was unable to move about.  Examination was difficult secondary to complaints of severe back pain and significant muscle spasm.  The patient had been on high doses of hydrocodone and oxycodone at home, and was not receiving any significant relief.  She was hospitalized, and a MRI that was performed on an outpatient basis had demonstrated that the patient has a degenerative spondylolisthesis at the L3-4 level without any evidence of overt nerve root entrapment.  She had some suggestion of some quadriceps weakness on the left side, however, the patient had continence of bowel and bladder.  She was placed at bedrest here, and was admitted to the hospital for pain control with parenteral medications, and was placed on a morphine pump.  She seemed to tolerate this well, and was then taken to the radiology suite where she underwent an epidural steroid injection.  This helped her pain significantly.  Over the next few days she was  gradually mobilized and ambulated.  She was also started on Neurontin which also helped significantly with the radicular pain.  The patient at the current time is being treated with only oral medications, she is independent in ambulation, and she is able to be discharged home to the care of her husband who works out of town.  FOLLOWUP:  She will be seen in about two weeks time for further evaluation. However, at this time, no surgical intervention is planned for degenerative listhesis. Dictated by:   Stefani Dama, M.D. Attending Physician:  Jonne Ply DD:  05/08/02 TD:  05/10/02 Job: [id number]

## 2011-05-13 ENCOUNTER — Other Ambulatory Visit: Payer: Self-pay | Admitting: Internal Medicine

## 2011-05-13 ENCOUNTER — Other Ambulatory Visit: Payer: Self-pay

## 2011-05-13 DIAGNOSIS — Z0000 Encounter for general adult medical examination without abnormal findings: Secondary | ICD-10-CM

## 2011-05-18 ENCOUNTER — Other Ambulatory Visit (INDEPENDENT_AMBULATORY_CARE_PROVIDER_SITE_OTHER): Payer: Medicare Other

## 2011-05-18 ENCOUNTER — Other Ambulatory Visit (INDEPENDENT_AMBULATORY_CARE_PROVIDER_SITE_OTHER): Payer: Medicare Other | Admitting: Internal Medicine

## 2011-05-18 DIAGNOSIS — Z0000 Encounter for general adult medical examination without abnormal findings: Secondary | ICD-10-CM

## 2011-05-18 LAB — CBC WITH DIFFERENTIAL/PLATELET
Basophils Absolute: 0 10*3/uL (ref 0.0–0.1)
Basophils Relative: 0.3 % (ref 0.0–3.0)
Eosinophils Absolute: 0.3 10*3/uL (ref 0.0–0.7)
Eosinophils Relative: 3.8 % (ref 0.0–5.0)
HCT: 43.1 % (ref 36.0–46.0)
Hemoglobin: 14 g/dL (ref 12.0–15.0)
Lymphocytes Relative: 32.6 % (ref 12.0–46.0)
Lymphs Abs: 2.3 10*3/uL (ref 0.7–4.0)
MCHC: 32.5 g/dL (ref 30.0–36.0)
MCV: 89.3 fl (ref 78.0–100.0)
Monocytes Absolute: 0.6 10*3/uL (ref 0.1–1.0)
Monocytes Relative: 8.3 % (ref 3.0–12.0)
Neutro Abs: 3.8 10*3/uL (ref 1.4–7.7)
Neutrophils Relative %: 55 % (ref 43.0–77.0)
Platelets: 298 10*3/uL (ref 150.0–400.0)
RBC: 4.83 Mil/uL (ref 3.87–5.11)
RDW: 15.1 % — ABNORMAL HIGH (ref 11.5–14.6)
WBC: 7 10*3/uL (ref 4.5–10.5)

## 2011-05-18 LAB — BASIC METABOLIC PANEL
BUN: 24 mg/dL — ABNORMAL HIGH (ref 6–23)
CO2: 30 mEq/L (ref 19–32)
Calcium: 8.9 mg/dL (ref 8.4–10.5)
Chloride: 107 mEq/L (ref 96–112)
Creatinine, Ser: 0.6 mg/dL (ref 0.4–1.2)
GFR: 110.13 mL/min (ref >60.00)
Glucose, Bld: 98 mg/dL (ref 70–99)
Potassium: 4.8 mEq/L (ref 3.5–5.1)
Sodium: 141 mEq/L (ref 135–145)

## 2011-05-18 LAB — URINALYSIS, ROUTINE W REFLEX MICROSCOPIC
Bilirubin Urine: NEGATIVE
Ketones, ur: NEGATIVE
Nitrite: NEGATIVE
Specific Gravity, Urine: 1.025 (ref 1.000–1.030)
Total Protein, Urine: NEGATIVE
Urine Glucose: NEGATIVE
Urobilinogen, UA: 0.2 (ref 0.0–1.0)
pH: 5.5 (ref 5.0–8.0)

## 2011-05-18 LAB — LIPID PANEL
Cholesterol: 127 mg/dL (ref 0–200)
HDL: 76.2 mg/dL (ref >39.00)
LDL Cholesterol: 48 mg/dL (ref 0–99)
Total CHOL/HDL Ratio: 2
Triglycerides: 13 mg/dL (ref 0.0–149.0)
VLDL: 2.6 mg/dL (ref 0.0–40.0)

## 2011-05-18 LAB — HEPATIC FUNCTION PANEL
ALT: 22 U/L (ref 0–35)
AST: 23 U/L (ref 0–37)
Albumin: 4.2 g/dL (ref 3.5–5.2)
Alkaline Phosphatase: 68 U/L (ref 39–117)
Bilirubin, Direct: 0.1 mg/dL (ref 0.0–0.3)
Total Bilirubin: 0.5 mg/dL (ref 0.3–1.2)
Total Protein: 7.4 g/dL (ref 6.0–8.3)

## 2011-05-18 LAB — TSH: TSH: 2.13 u[IU]/mL (ref 0.35–5.50)

## 2011-05-20 ENCOUNTER — Ambulatory Visit (INDEPENDENT_AMBULATORY_CARE_PROVIDER_SITE_OTHER): Payer: Medicare Other | Admitting: Internal Medicine

## 2011-05-20 ENCOUNTER — Encounter: Payer: Self-pay | Admitting: Internal Medicine

## 2011-05-20 DIAGNOSIS — M545 Low back pain, unspecified: Secondary | ICD-10-CM

## 2011-05-20 DIAGNOSIS — M1990 Unspecified osteoarthritis, unspecified site: Secondary | ICD-10-CM

## 2011-05-20 DIAGNOSIS — I10 Essential (primary) hypertension: Secondary | ICD-10-CM

## 2011-05-20 MED ORDER — SPIRONOLACTONE 25 MG PO TABS: 25.0000 mg | ORAL_TABLET | Freq: Every day | ORAL | Status: DC

## 2011-05-20 MED ORDER — VALSARTAN 80 MG PO TABS: 80.0000 mg | ORAL_TABLET | Freq: Every day | ORAL | Status: DC

## 2011-05-20 NOTE — Assessment & Plan Note (Signed)
On Celebrex prn 

## 2011-05-20 NOTE — Progress Notes (Signed)
  Subjective:    Patient ID: Erin [name], female    DOB: 1935-09-01, 76 y.o.   MRN: [id number]  HPI The patient presents for a follow-up of  chronic hypertension, chronic edema, LE cramps controlled with medicines     Review of Systems  Constitutional: Negative for chills, activity change, appetite change, fatigue and unexpected weight change.  HENT: Negative for congestion, mouth sores and sinus pressure.        Decreased hearing  Eyes: Negative for visual disturbance.  Respiratory: Negative for cough and chest tightness.   Gastrointestinal: Negative for nausea and abdominal pain.  Genitourinary: Negative for frequency, difficulty urinating and vaginal pain.  Musculoskeletal: Negative for back pain and gait problem.       Cramps  Skin: Negative for pallor and rash.  Neurological: Negative for dizziness, tremors, weakness, numbness and headaches.  Psychiatric/Behavioral: Negative for confusion and sleep disturbance.       Objective:   Physical Exam  Constitutional: She appears well-developed and well-nourished. No distress.  HENT:  Head: Normocephalic.  Right Ear: External ear normal.  Left Ear: External ear normal.  Nose: Nose normal.  Mouth/Throat: Oropharynx is clear and moist.  Eyes: Conjunctivae are normal. Pupils are equal, round, and reactive to light. Right eye exhibits no discharge. Left eye exhibits no discharge.  Neck: Normal range of motion. Neck supple. No JVD present. No tracheal deviation present. No thyromegaly present.  Cardiovascular: Normal rate, regular rhythm and normal heart sounds.   Pulmonary/Chest: No stridor. No respiratory distress. She has no wheezes.  Abdominal: Soft. Bowel sounds are normal. She exhibits no distension and no mass. There is no tenderness. There is no rebound and no guarding.  Musculoskeletal: She exhibits no edema and no tenderness.  Lymphadenopathy:    She has no cervical adenopathy.  Neurological: She displays normal  reflexes. No cranial nerve deficit. She exhibits normal muscle tone. Coordination normal.  Skin: No rash noted. No erythema.  Psychiatric: She has a normal mood and affect. Her behavior is normal. Judgment and thought content normal.       Lab Results  Component Value Date   WBC 7.0 05/18/2011   HGB 14.0 05/18/2011   HCT 43.1 05/18/2011   PLT 298.0 05/18/2011   CHOL 127 05/18/2011   TRIG 13.0 05/18/2011   HDL 76.20 05/18/2011   ALT 22 05/18/2011   AST 23 05/18/2011   NA 141 05/18/2011   K 4.8 05/18/2011   CL 107 05/18/2011   CREATININE 0.6 05/18/2011   BUN 24* 05/18/2011   CO2 30 05/18/2011   TSH 2.13 05/18/2011      Assessment & Plan:

## 2011-05-20 NOTE — Assessment & Plan Note (Signed)
On Rx 

## 2011-05-20 NOTE — Patient Instructions (Signed)
Call if BP is up

## 2011-09-09 ENCOUNTER — Ambulatory Visit (INDEPENDENT_AMBULATORY_CARE_PROVIDER_SITE_OTHER): Payer: Medicare Other | Admitting: Internal Medicine

## 2011-09-09 ENCOUNTER — Encounter: Payer: Self-pay | Admitting: Internal Medicine

## 2011-09-09 DIAGNOSIS — M545 Low back pain, unspecified: Secondary | ICD-10-CM

## 2011-09-09 DIAGNOSIS — I10 Essential (primary) hypertension: Secondary | ICD-10-CM

## 2011-09-09 DIAGNOSIS — E669 Obesity, unspecified: Secondary | ICD-10-CM

## 2011-09-09 DIAGNOSIS — R609 Edema, unspecified: Secondary | ICD-10-CM

## 2011-09-09 MED ORDER — SPIRONOLACTONE 25 MG PO TABS: 25.0000 mg | ORAL_TABLET | Freq: Every day | ORAL | Status: DC

## 2011-09-09 MED ORDER — LORATADINE 10 MG PO TABS: 10.0000 mg | ORAL_TABLET | Freq: Every day | ORAL | Status: DC | PRN

## 2011-09-09 MED ORDER — VALSARTAN 80 MG PO TABS: 80.0000 mg | ORAL_TABLET | Freq: Every day | ORAL | Status: DC

## 2011-09-09 MED ORDER — CELECOXIB 200 MG PO CAPS: 200.0000 mg | ORAL_CAPSULE | Freq: Two times a day (BID) | ORAL | Status: DC | PRN

## 2011-09-09 MED ORDER — PHENTERMINE HCL 37.5 MG PO CAPS: 37.5000 mg | ORAL_CAPSULE | ORAL | Status: DC

## 2011-09-09 MED ORDER — CYCLOBENZAPRINE HCL 10 MG PO TABS: 5.0000 mg | ORAL_TABLET | Freq: Two times a day (BID) | ORAL | Status: DC | PRN

## 2011-09-09 NOTE — Progress Notes (Signed)
  Subjective:    Patient ID: Erin [name], female    DOB: 09/29/1935, 75 y.o.   MRN: [id number]  HPI  The patient presents for a follow-up of  chronic hypertension, chronic dyslipidemia, OA in feet , LBP controlled with medicines    Review of Systems  Constitutional: Negative for chills, activity change, appetite change, fatigue and unexpected weight change.  HENT: Negative for congestion, mouth sores and sinus pressure.   Eyes: Negative for visual disturbance.  Respiratory: Negative for cough and chest tightness.   Gastrointestinal: Negative for nausea and abdominal pain.  Genitourinary: Negative for frequency, difficulty urinating and vaginal pain.  Musculoskeletal: Positive for back pain (mild) and arthralgias (feet). Negative for gait problem.  Skin: Negative for pallor and rash.  Neurological: Negative for dizziness, tremors, weakness, numbness and headaches.  Psychiatric/Behavioral: Negative for confusion and sleep disturbance.       Objective:   Physical Exam  Constitutional: She appears well-developed and well-nourished. No distress.  HENT:  Head: Normocephalic.  Right Ear: External ear normal.  Left Ear: External ear normal.  Nose: Nose normal.  Mouth/Throat: Oropharynx is clear and moist.  Eyes: Conjunctivae are normal. Pupils are equal, round, and reactive to light. Right eye exhibits no discharge. Left eye exhibits no discharge.  Neck: Normal range of motion. Neck supple. No JVD present. No tracheal deviation present. No thyromegaly present.  Cardiovascular: Normal rate, regular rhythm and normal heart sounds.   Pulmonary/Chest: No stridor. No respiratory distress. She has no wheezes.  Abdominal: Soft. Bowel sounds are normal. She exhibits no distension and no mass. There is no tenderness. There is no rebound and no guarding.  Musculoskeletal: She exhibits no edema and no tenderness.  Lymphadenopathy:    She has no cervical adenopathy.  Neurological: She displays  normal reflexes. No cranial nerve deficit. She exhibits normal muscle tone. Coordination normal.  Skin: No rash noted. No erythema.  Psychiatric: She has a normal mood and affect. Her behavior is normal. Judgment and thought content normal.          Assessment & Plan:

## 2011-09-09 NOTE — Assessment & Plan Note (Signed)
Continue with current prescription therapy as reflected on the Med list.  

## 2011-09-09 NOTE — Assessment & Plan Note (Signed)
Continue with current prescription therapy as reflected on the Med list. She states - it is better (normal) at home

## 2011-10-27 ENCOUNTER — Encounter: Payer: Self-pay | Admitting: Internal Medicine

## 2011-10-27 ENCOUNTER — Other Ambulatory Visit (INDEPENDENT_AMBULATORY_CARE_PROVIDER_SITE_OTHER): Payer: Medicare Other

## 2011-10-27 ENCOUNTER — Other Ambulatory Visit: Payer: Self-pay | Admitting: Internal Medicine

## 2011-10-27 ENCOUNTER — Ambulatory Visit (INDEPENDENT_AMBULATORY_CARE_PROVIDER_SITE_OTHER): Payer: Medicare Other | Admitting: Internal Medicine

## 2011-10-27 VITALS — BP 170/92 | HR 80 | Temp 98.4°F | Resp 16 | Wt 147.0 lb

## 2011-10-27 DIAGNOSIS — R310 Gross hematuria: Secondary | ICD-10-CM

## 2011-10-27 DIAGNOSIS — N3090 Cystitis, unspecified without hematuria: Secondary | ICD-10-CM | POA: Diagnosis not present

## 2011-10-27 DIAGNOSIS — I10 Essential (primary) hypertension: Secondary | ICD-10-CM | POA: Diagnosis not present

## 2011-10-27 LAB — BASIC METABOLIC PANEL
BUN: 21 mg/dL (ref 6–23)
CO2: 33 mEq/L — ABNORMAL HIGH (ref 19–32)
Calcium: 9 mg/dL (ref 8.4–10.5)
Chloride: 105 mEq/L (ref 96–112)
Creatinine, Ser: 0.6 mg/dL (ref 0.4–1.2)
GFR: 114.63 mL/min (ref >60.00)
Glucose, Bld: 107 mg/dL — ABNORMAL HIGH (ref 70–99)
Potassium: 3.6 mEq/L (ref 3.5–5.1)
Sodium: 144 mEq/L (ref 135–145)

## 2011-10-27 LAB — URINALYSIS, ROUTINE W REFLEX MICROSCOPIC
Bilirubin Urine: NEGATIVE
Ketones, ur: NEGATIVE
Nitrite: NEGATIVE
Specific Gravity, Urine: >=1.03 (ref 1.000–1.030)
Total Protein, Urine: NEGATIVE
Urine Glucose: NEGATIVE
Urobilinogen, UA: 0.2 (ref 0.0–1.0)
pH: 6 (ref 5.0–8.0)

## 2011-10-27 MED ORDER — CIPROFLOXACIN HCL 250 MG PO TABS: 250.0000 mg | ORAL_TABLET | Freq: Two times a day (BID) | ORAL | Status: AC

## 2011-10-27 NOTE — Progress Notes (Signed)
  Subjective:    Patient ID: Erin [name], female    DOB: [birth year], 77 y.o.   MRN: [id number]  HPI  C/o urinary sx's and blood in urine x 1 d  Review of Systems  Constitutional: Negative for fever and chills.  Genitourinary: Positive for dysuria, urgency and frequency. Negative for vaginal bleeding and vaginal discharge.       Objective:   Physical Exam  Constitutional: She appears well-developed. No distress.  HENT:  Head: Normocephalic.  Right Ear: External ear normal.  Left Ear: External ear normal.  Nose: Nose normal.  Mouth/Throat: Oropharynx is clear and moist.  Eyes: Conjunctivae are normal. Pupils are equal, round, and reactive to light. Right eye exhibits no discharge. Left eye exhibits no discharge.  Neck: Normal range of motion. Neck supple. No JVD present. No tracheal deviation present. No thyromegaly present.  Cardiovascular: Normal rate, regular rhythm and normal heart sounds.   Pulmonary/Chest: No stridor. No respiratory distress. She has no wheezes.  Abdominal: Soft. Bowel sounds are normal. She exhibits no distension and no mass. There is no tenderness. There is no rebound and no guarding.  Musculoskeletal: She exhibits no edema and no tenderness.  Lymphadenopathy:    She has no cervical adenopathy.  Neurological: She displays normal reflexes. No cranial nerve deficit. She exhibits normal muscle tone. Coordination normal.  Skin: No rash noted. No erythema.  Psychiatric: She has a normal mood and affect. Her behavior is normal. Judgment and thought content normal.          Assessment & Plan:

## 2011-10-27 NOTE — Assessment & Plan Note (Signed)
Cipro x 7 d UA

## 2011-10-27 NOTE — Assessment & Plan Note (Signed)
2003 kidney stone 1/13 Abd [place]. Treat UTI

## 2011-10-29 ENCOUNTER — Telehealth: Payer: Self-pay | Admitting: Internal Medicine

## 2011-10-29 NOTE — Telephone Encounter (Signed)
PT WOULD LIKE TO CHANGE PHENTERMINE FROM CAPSULES TO TABLETS.  [phone] (PT'S NUMBER)

## 2011-11-01 MED ORDER — PHENTERMINE HCL 37.5 MG PO TABS: 37.5000 mg | ORAL_TABLET | Freq: Every day | ORAL | Status: DC

## 2011-11-01 NOTE — Telephone Encounter (Signed)
Rx phoned in.   

## 2011-11-01 NOTE — Telephone Encounter (Signed)
Ok thx.

## 2011-11-02 ENCOUNTER — Other Ambulatory Visit: Payer: Medicare Other

## 2011-11-16 DIAGNOSIS — D235 Other benign neoplasm of skin of trunk: Secondary | ICD-10-CM | POA: Diagnosis not present

## 2011-11-16 DIAGNOSIS — L570 Actinic keratosis: Secondary | ICD-10-CM | POA: Diagnosis not present

## 2011-12-06 ENCOUNTER — Telehealth: Payer: Self-pay

## 2011-12-06 MED ORDER — CIPROFLOXACIN HCL 250 MG PO TABS: 250.0000 mg | ORAL_TABLET | Freq: Two times a day (BID) | ORAL | Status: AC

## 2011-12-06 NOTE — Telephone Encounter (Signed)
Pt called c/o cystitis; frequency, dysuria and urgency. Pt is requesting Rx to treat, please advise.

## 2011-12-06 NOTE — Telephone Encounter (Signed)
Ok Cipro Thx 

## 2011-12-07 NOTE — Telephone Encounter (Signed)
Left detailed mess informing pt of below.  

## 2012-02-15 ENCOUNTER — Other Ambulatory Visit: Payer: Self-pay | Admitting: Internal Medicine

## 2012-02-15 ENCOUNTER — Telehealth: Payer: Self-pay | Admitting: *Deleted

## 2012-02-15 NOTE — Telephone Encounter (Signed)
OK to fill this prescription with additional refills x2 Thank you!  

## 2012-02-15 NOTE — Telephone Encounter (Signed)
Requested Medications     phentermine (ADIPEX-P) 37.5 MG tablet [Pharmacy Med Name: PHENTERMINE 37.5MG  TAB]   TAKE ONE TABLET BY MOUTH EVERY DAY IN THE MORNING   Disp: 30 each R: 0 Start: 02/15/2012  Class: Normal   Requested on: 11/01/2011   Last refill: 12/02/2011

## 2012-02-16 MED ORDER — PHENTERMINE HCL 37.5 MG PO CAPS: 37.5000 mg | ORAL_CAPSULE | ORAL | Status: DC

## 2012-02-16 NOTE — Telephone Encounter (Signed)
Done

## 2012-03-08 ENCOUNTER — Encounter: Payer: Self-pay | Admitting: Internal Medicine

## 2012-03-08 ENCOUNTER — Ambulatory Visit (INDEPENDENT_AMBULATORY_CARE_PROVIDER_SITE_OTHER): Payer: Medicare Other | Admitting: Internal Medicine

## 2012-03-08 ENCOUNTER — Telehealth: Payer: Self-pay | Admitting: Internal Medicine

## 2012-03-08 ENCOUNTER — Other Ambulatory Visit (INDEPENDENT_AMBULATORY_CARE_PROVIDER_SITE_OTHER): Payer: Medicare Other

## 2012-03-08 VITALS — BP 160/90 | HR 76 | Temp 97.8°F | Resp 16 | Wt 147.0 lb

## 2012-03-08 DIAGNOSIS — I10 Essential (primary) hypertension: Secondary | ICD-10-CM

## 2012-03-08 DIAGNOSIS — M545 Low back pain, unspecified: Secondary | ICD-10-CM

## 2012-03-08 DIAGNOSIS — M109 Gout, unspecified: Secondary | ICD-10-CM

## 2012-03-08 DIAGNOSIS — E669 Obesity, unspecified: Secondary | ICD-10-CM

## 2012-03-08 DIAGNOSIS — R609 Edema, unspecified: Secondary | ICD-10-CM

## 2012-03-08 LAB — URINALYSIS, ROUTINE W REFLEX MICROSCOPIC
Bilirubin Urine: NEGATIVE
Ketones, ur: NEGATIVE
Nitrite: NEGATIVE
Specific Gravity, Urine: 1.01 (ref 1.000–1.030)
Total Protein, Urine: NEGATIVE
Urine Glucose: NEGATIVE
Urobilinogen, UA: 0.2 (ref 0.0–1.0)
pH: 6 (ref 5.0–8.0)

## 2012-03-08 LAB — BASIC METABOLIC PANEL
BUN: 24 mg/dL — ABNORMAL HIGH (ref 6–23)
CO2: 31 mEq/L (ref 19–32)
Calcium: 9 mg/dL (ref 8.4–10.5)
Chloride: 102 mEq/L (ref 96–112)
Creatinine, Ser: 0.7 mg/dL (ref 0.4–1.2)
GFR: 81.31 mL/min (ref >60.00)
Glucose, Bld: 96 mg/dL (ref 70–99)
Potassium: 4.6 mEq/L (ref 3.5–5.1)
Sodium: 139 mEq/L (ref 135–145)

## 2012-03-08 LAB — HEPATIC FUNCTION PANEL
ALT: 18 U/L (ref 0–35)
AST: 22 U/L (ref 0–37)
Albumin: 3.8 g/dL (ref 3.5–5.2)
Alkaline Phosphatase: 60 U/L (ref 39–117)
Bilirubin, Direct: 0 mg/dL (ref 0.0–0.3)
Total Bilirubin: 0.6 mg/dL (ref 0.3–1.2)
Total Protein: 7.1 g/dL (ref 6.0–8.3)

## 2012-03-08 LAB — URIC ACID: Uric Acid, Serum: 5 mg/dL (ref 2.4–7.0)

## 2012-03-08 MED ORDER — VALSARTAN 160 MG PO TABS: 160.0000 mg | ORAL_TABLET | Freq: Every day | ORAL | Status: DC

## 2012-03-08 MED ORDER — CELECOXIB 200 MG PO CAPS: 200.0000 mg | ORAL_CAPSULE | Freq: Two times a day (BID) | ORAL | Status: DC | PRN

## 2012-03-08 MED ORDER — SPIRONOLACTONE 25 MG PO TABS: 25.0000 mg | ORAL_TABLET | Freq: Every day | ORAL | Status: DC

## 2012-03-08 MED ORDER — PHENTERMINE HCL 37.5 MG PO TABS: 37.5000 mg | ORAL_TABLET | Freq: Every day | ORAL | Status: DC

## 2012-03-08 MED ORDER — CEFUROXIME AXETIL 250 MG PO TABS: 250.0000 mg | ORAL_TABLET | Freq: Two times a day (BID) | ORAL | Status: DC

## 2012-03-08 MED ORDER — LORATADINE 10 MG PO TABS: 10.0000 mg | ORAL_TABLET | Freq: Every day | ORAL | Status: DC | PRN

## 2012-03-08 NOTE — Telephone Encounter (Signed)
Stacey, please, inform patient that all labs are normal except for a UTI Take abx  

## 2012-03-08 NOTE — Assessment & Plan Note (Signed)
resolved 

## 2012-03-08 NOTE — Assessment & Plan Note (Signed)
Chronic She states - it is better (normal) at home

## 2012-03-08 NOTE — Assessment & Plan Note (Signed)
Potential benefits of a long term phentermine use as well as potential risks  and complications were explained to the patient and were aknowledged. She states BP is nl at home

## 2012-03-08 NOTE — Assessment & Plan Note (Signed)
Better  

## 2012-03-08 NOTE — Progress Notes (Signed)
Patient ID: Erin [name], female   DOB: [birth year], 77 y.o.   MRN: [id number]  Subjective:    Patient ID: Erin [name], female    DOB: 11/23/1934, 77 y.o.   MRN: [id number]  HPI  The patient presents for a follow-up of  chronic hypertension, chronic OA, obesity     BP Readings from Last 3 Encounters:  03/08/12 160/90  10/27/11 170/92  09/09/11 152/88   Wt Readings from Last 3 Encounters:  03/08/12 147 lb (66.679 kg)  10/27/11 147 lb (66.679 kg)  09/09/11 152 lb (68.947 kg)      Review of Systems  Constitutional: Negative for fever and chills.  Genitourinary: Positive for dysuria, urgency and frequency. Negative for vaginal bleeding and vaginal discharge.       Objective:   Physical Exam  Constitutional: She appears well-developed. No distress.  HENT:  Head: Normocephalic.  Right Ear: External ear normal.  Left Ear: External ear normal.  Nose: Nose normal.  Mouth/Throat: Oropharynx is clear and moist.  Eyes: Conjunctivae are normal. Pupils are equal, round, and reactive to light. Right eye exhibits no discharge. Left eye exhibits no discharge.  Neck: Normal range of motion. Neck supple. No JVD present. No tracheal deviation present. No thyromegaly present.  Cardiovascular: Normal rate, regular rhythm and normal heart sounds.   Pulmonary/Chest: No stridor. No respiratory distress. She has no wheezes.  Abdominal: Soft. Bowel sounds are normal. She exhibits no distension and no mass. There is no tenderness. There is no rebound and no guarding.  Musculoskeletal: She exhibits no edema and no tenderness.  Lymphadenopathy:    She has no cervical adenopathy.  Neurological: She displays normal reflexes. No cranial nerve deficit. She exhibits normal muscle tone. Coordination normal.  Skin: No rash noted. No erythema.  Psychiatric: She has a normal mood and affect. Her behavior is normal. Judgment and thought content normal.   Lab Results  Component Value Date   WBC 7.0  05/18/2011   HGB 14.0 05/18/2011   HCT 43.1 05/18/2011   PLT 298.0 05/18/2011   GLUCOSE 107* 10/27/2011   CHOL 127 05/18/2011   TRIG 13.0 05/18/2011   HDL 76.20 05/18/2011   LDLCALC 48 05/18/2011   ALT 22 05/18/2011   AST 23 05/18/2011   NA 144 10/27/2011   K 3.6 10/27/2011   CL 105 10/27/2011   CREATININE 0.6 10/27/2011   BUN 21 10/27/2011   CO2 33* 10/27/2011   TSH 2.13 05/18/2011          Assessment & Plan:

## 2012-03-09 NOTE — Telephone Encounter (Signed)
Left detailed mess informing pt of below.  

## 2012-03-27 ENCOUNTER — Telehealth: Payer: Self-pay | Admitting: Internal Medicine

## 2012-03-27 MED ORDER — VALSARTAN 160 MG PO TABS: 160.0000 mg | ORAL_TABLET | Freq: Every day | ORAL | Status: DC

## 2012-03-27 NOTE — Telephone Encounter (Signed)
Pt was here on May 15 and received a written RX for Diovan.  She has lost it.  Please call in to [facility] in [place].  Her phone:[phone]

## 2012-03-27 NOTE — Telephone Encounter (Signed)
Done. Pt informed.

## 2012-06-13 DIAGNOSIS — D235 Other benign neoplasm of skin of trunk: Secondary | ICD-10-CM | POA: Diagnosis not present

## 2012-07-12 ENCOUNTER — Other Ambulatory Visit (INDEPENDENT_AMBULATORY_CARE_PROVIDER_SITE_OTHER): Payer: Medicare Other

## 2012-07-12 ENCOUNTER — Ambulatory Visit (INDEPENDENT_AMBULATORY_CARE_PROVIDER_SITE_OTHER): Payer: Medicare Other | Admitting: Internal Medicine

## 2012-07-12 ENCOUNTER — Encounter: Payer: Self-pay | Admitting: Internal Medicine

## 2012-07-12 VITALS — BP 150/92 | HR 80 | Temp 98.1°F | Resp 16 | Wt 143.5 lb

## 2012-07-12 DIAGNOSIS — M545 Low back pain, unspecified: Secondary | ICD-10-CM

## 2012-07-12 DIAGNOSIS — R635 Abnormal weight gain: Secondary | ICD-10-CM

## 2012-07-12 DIAGNOSIS — I10 Essential (primary) hypertension: Secondary | ICD-10-CM

## 2012-07-12 DIAGNOSIS — M1990 Unspecified osteoarthritis, unspecified site: Secondary | ICD-10-CM | POA: Diagnosis not present

## 2012-07-12 LAB — BASIC METABOLIC PANEL
BUN: 24 mg/dL — ABNORMAL HIGH (ref 6–23)
CO2: 31 mEq/L (ref 19–32)
Calcium: 9.2 mg/dL (ref 8.4–10.5)
Chloride: 103 mEq/L (ref 96–112)
Creatinine, Ser: 0.8 mg/dL (ref 0.4–1.2)
GFR: 78.77 mL/min (ref >60.00)
Glucose, Bld: 105 mg/dL — ABNORMAL HIGH (ref 70–99)
Potassium: 4.8 mEq/L (ref 3.5–5.1)
Sodium: 140 mEq/L (ref 135–145)

## 2012-07-12 LAB — TSH: TSH: 1.71 u[IU]/mL (ref 0.35–5.50)

## 2012-07-12 MED ORDER — BENAZEPRIL HCL 20 MG PO TABS: 20.0000 mg | ORAL_TABLET | Freq: Every day | ORAL | Status: DC

## 2012-07-12 MED ORDER — TRIAMCINOLONE ACETONIDE 0.5 % EX CREA: TOPICAL_CREAM | Freq: Three times a day (TID) | CUTANEOUS | Status: DC

## 2012-07-12 MED ORDER — TRETINOIN 0.1 % EX CREA: TOPICAL_CREAM | Freq: Every day | CUTANEOUS | Status: DC

## 2012-07-12 MED ORDER — SPIRONOLACTONE 25 MG PO TABS: 50.0000 mg | ORAL_TABLET | Freq: Every day | ORAL | Status: DC

## 2012-07-12 NOTE — Assessment & Plan Note (Signed)
Continue with current prescription therapy as reflected on the Med list.  

## 2012-07-12 NOTE — Progress Notes (Signed)
  Subjective:    Patient ID: Erin [name], female    DOB: [birth year], 77 y.o.   MRN: [id number]  HPI  The patient presents for a follow-up of  chronic hypertension, chronic OA, obesity, edema     BP Readings from Last 3 Encounters:  07/12/12 150/92  03/08/12 160/90  10/27/11 170/92   Wt Readings from Last 3 Encounters:  07/12/12 143 lb 8 oz (65.091 kg)  03/08/12 147 lb (66.679 kg)  10/27/11 147 lb (66.679 kg)      Review of Systems  Constitutional: Negative for fever, chills, activity change, appetite change, fatigue and unexpected weight change.  HENT: Negative for ear pain, congestion, mouth sores and sinus pressure.   Eyes: Negative for visual disturbance.  Respiratory: Negative for cough and chest tightness.   Gastrointestinal: Negative for nausea and abdominal pain.  Genitourinary: Positive for dysuria, urgency and frequency. Negative for vaginal bleeding, vaginal discharge, difficulty urinating and vaginal pain.  Musculoskeletal: Negative for back pain and gait problem.  Skin: Negative for pallor and rash.  Neurological: Negative for dizziness, tremors, weakness, numbness and headaches.  Psychiatric/Behavioral: Negative for confusion and disturbed wake/sleep cycle.       Objective:   Physical Exam  Constitutional: She appears well-developed. No distress.  HENT:  Head: Normocephalic.  Right Ear: External ear normal.  Left Ear: External ear normal.  Nose: Nose normal.  Mouth/Throat: Oropharynx is clear and moist.  Eyes: Conjunctivae normal are normal. Pupils are equal, round, and reactive to light. Right eye exhibits no discharge. Left eye exhibits no discharge.  Neck: Normal range of motion. Neck supple. No JVD present. No tracheal deviation present. No thyromegaly present.  Cardiovascular: Normal rate, regular rhythm and normal heart sounds.   Pulmonary/Chest: No stridor. No respiratory distress. She has no wheezes.  Abdominal: Soft. Bowel sounds are normal.  She exhibits no distension and no mass. There is no tenderness. There is no rebound and no guarding.  Musculoskeletal: She exhibits no edema and no tenderness.  Lymphadenopathy:    She has no cervical adenopathy.  Neurological: She displays normal reflexes. No cranial nerve deficit. She exhibits normal muscle tone. Coordination normal.  Skin: No rash noted. No erythema.  Psychiatric: She has a normal mood and affect. Her behavior is normal. Judgment and thought content normal.   Lab Results  Component Value Date   WBC 7.0 05/18/2011   HGB 14.0 05/18/2011   HCT 43.1 05/18/2011   PLT 298.0 05/18/2011   GLUCOSE 96 03/08/2012   CHOL 127 05/18/2011   TRIG 13.0 05/18/2011   HDL 76.20 05/18/2011   LDLCALC 48 05/18/2011   ALT 18 03/08/2012   AST 22 03/08/2012   NA 139 03/08/2012   K 4.6 03/08/2012   CL 102 03/08/2012   CREATININE 0.7 03/08/2012   BUN 24* 03/08/2012   CO2 31 03/08/2012   TSH 2.13 05/18/2011          Assessment & Plan:

## 2012-07-12 NOTE — Assessment & Plan Note (Addendum)
We will increase Spironolactone dose. Change to Lotensin

## 2012-07-18 ENCOUNTER — Telehealth: Payer: Self-pay

## 2012-07-18 MED ORDER — CIPROFLOXACIN HCL 500 MG PO TABS: 500.0000 mg | ORAL_TABLET | Freq: Two times a day (BID) | ORAL | Status: DC

## 2012-07-18 NOTE — Telephone Encounter (Signed)
Pt called requesting Rx for Cipro 500 mg BID x 10 to treat a bladder infection (frequency, urgency, cloudy and malodorous urine) Please advise

## 2012-07-18 NOTE — Telephone Encounter (Signed)
OK to fill this prescription with additional refills x0 OV if sick Thank you!  

## 2012-07-18 NOTE — Telephone Encounter (Signed)
Pt advised of Rx/pharmacy via personal VM 

## 2012-09-11 DIAGNOSIS — H35379 Puckering of macula, unspecified eye: Secondary | ICD-10-CM | POA: Diagnosis not present

## 2012-09-11 DIAGNOSIS — Z961 Presence of intraocular lens: Secondary | ICD-10-CM | POA: Diagnosis not present

## 2012-09-11 DIAGNOSIS — H35039 Hypertensive retinopathy, unspecified eye: Secondary | ICD-10-CM | POA: Diagnosis not present

## 2012-09-11 DIAGNOSIS — H3581 Retinal edema: Secondary | ICD-10-CM | POA: Diagnosis not present

## 2012-09-11 DIAGNOSIS — H04129 Dry eye syndrome of unspecified lacrimal gland: Secondary | ICD-10-CM | POA: Diagnosis not present

## 2012-09-19 ENCOUNTER — Encounter: Payer: Self-pay | Admitting: Internal Medicine

## 2012-09-19 ENCOUNTER — Other Ambulatory Visit: Payer: Self-pay | Admitting: Cardiology

## 2012-09-19 ENCOUNTER — Ambulatory Visit (INDEPENDENT_AMBULATORY_CARE_PROVIDER_SITE_OTHER): Payer: Medicare Other | Admitting: Internal Medicine

## 2012-09-19 ENCOUNTER — Other Ambulatory Visit (INDEPENDENT_AMBULATORY_CARE_PROVIDER_SITE_OTHER): Payer: Medicare Other

## 2012-09-19 VITALS — BP 178/80 | HR 80 | Temp 97.1°F | Resp 16 | Wt 147.0 lb

## 2012-09-19 DIAGNOSIS — I10 Essential (primary) hypertension: Secondary | ICD-10-CM | POA: Diagnosis not present

## 2012-09-19 DIAGNOSIS — R609 Edema, unspecified: Secondary | ICD-10-CM

## 2012-09-19 DIAGNOSIS — I6350 Cerebral infarction due to unspecified occlusion or stenosis of unspecified cerebral artery: Secondary | ICD-10-CM | POA: Diagnosis not present

## 2012-09-19 DIAGNOSIS — G459 Transient cerebral ischemic attack, unspecified: Secondary | ICD-10-CM

## 2012-09-19 DIAGNOSIS — M109 Gout, unspecified: Secondary | ICD-10-CM

## 2012-09-19 DIAGNOSIS — M545 Low back pain, unspecified: Secondary | ICD-10-CM | POA: Diagnosis not present

## 2012-09-19 DIAGNOSIS — I639 Cerebral infarction, unspecified: Secondary | ICD-10-CM

## 2012-09-19 DIAGNOSIS — Z8673 Personal history of transient ischemic attack (TIA), and cerebral infarction without residual deficits: Secondary | ICD-10-CM | POA: Insufficient documentation

## 2012-09-19 LAB — CBC WITH DIFFERENTIAL/PLATELET
Basophils Absolute: 0 10*3/uL (ref 0.0–0.1)
Basophils Relative: 0.7 % (ref 0.0–3.0)
Eosinophils Absolute: 0.2 10*3/uL (ref 0.0–0.7)
Eosinophils Relative: 3.6 % (ref 0.0–5.0)
HCT: 45.6 % (ref 36.0–46.0)
Hemoglobin: 14.8 g/dL (ref 12.0–15.0)
Lymphocytes Relative: 32.5 % (ref 12.0–46.0)
Lymphs Abs: 2.3 10*3/uL (ref 0.7–4.0)
MCHC: 32.5 g/dL (ref 30.0–36.0)
MCV: 89.3 fl (ref 78.0–100.0)
Monocytes Absolute: 0.6 10*3/uL (ref 0.1–1.0)
Monocytes Relative: 8.6 % (ref 3.0–12.0)
Neutro Abs: 3.8 10*3/uL (ref 1.4–7.7)
Neutrophils Relative %: 54.6 % (ref 43.0–77.0)
Platelets: 288 10*3/uL (ref 150.0–400.0)
RBC: 5.11 Mil/uL (ref 3.87–5.11)
RDW: 14.5 % (ref 11.5–14.6)
WBC: 7 10*3/uL (ref 4.5–10.5)

## 2012-09-19 LAB — BASIC METABOLIC PANEL WITH GFR
BUN: 21 mg/dL (ref 6–23)
CO2: 31 meq/L (ref 19–32)
Calcium: 8.8 mg/dL (ref 8.4–10.5)
Chloride: 103 meq/L (ref 96–112)
Creatinine, Ser: 0.6 mg/dL (ref 0.4–1.2)
GFR: 97.76 mL/min
Glucose, Bld: 105 mg/dL — ABNORMAL HIGH (ref 70–99)
Potassium: 4.3 meq/L (ref 3.5–5.1)
Sodium: 139 meq/L (ref 135–145)

## 2012-09-19 LAB — URINALYSIS, ROUTINE W REFLEX MICROSCOPIC
Bilirubin Urine: NEGATIVE
Ketones, ur: NEGATIVE
Nitrite: POSITIVE
Specific Gravity, Urine: 1.015 (ref 1.000–1.030)
Total Protein, Urine: NEGATIVE
Urine Glucose: NEGATIVE
Urobilinogen, UA: 0.2 (ref 0.0–1.0)
pH: 6 (ref 5.0–8.0)

## 2012-09-19 LAB — TSH: TSH: 1.71 u[IU]/mL (ref 0.35–5.50)

## 2012-09-19 LAB — LIPID PANEL
Cholesterol: 149 mg/dL (ref 0–200)
HDL: 91.4 mg/dL (ref >39.00)
LDL Cholesterol: 54 mg/dL (ref 0–99)
Total CHOL/HDL Ratio: 2
Triglycerides: 18 mg/dL (ref 0.0–149.0)
VLDL: 3.6 mg/dL (ref 0.0–40.0)

## 2012-09-19 LAB — URIC ACID: Uric Acid, Serum: 4.6 mg/dL (ref 2.4–7.0)

## 2012-09-19 MED ORDER — AMLODIPINE BESYLATE-VALSARTAN 5-320 MG PO TABS: 1.0000 | ORAL_TABLET | Freq: Every day | ORAL | Status: DC

## 2012-09-19 MED ORDER — ASPIRIN 81 MG PO CHEW: 81.0000 mg | CHEWABLE_TABLET | Freq: Every day | ORAL | Status: DC

## 2012-09-19 NOTE — Progress Notes (Signed)
   Subjective:    Patient ID: Erin [name], female    DOB: Dec 05, 1934, 77 y.o.   MRN: [id number]  Hypertension Pertinent negatives include no headaches.    The patient presents for a follow-up of  chronic hypertension, chronic OA, obesity, edema. She had ben taking Diovan 160 mg/d. C/o a "CVA in the eye per her eye doctor". Brngs in BP record - -- elevated BP     BP Readings from Last 3 Encounters:  09/19/12 178/80  07/12/12 150/92  03/08/12 160/90   Wt Readings from Last 3 Encounters:  09/19/12 147 lb (66.679 kg)  07/12/12 143 lb 8 oz (65.091 kg)  03/08/12 147 lb (66.679 kg)      Review of Systems  Constitutional: Negative for fever, chills, activity change, appetite change, fatigue and unexpected weight change.  HENT: Negative for ear pain, congestion, mouth sores and sinus pressure.   Eyes: Negative for visual disturbance.  Respiratory: Negative for cough and chest tightness.   Gastrointestinal: Negative for nausea and abdominal pain.  Genitourinary: Positive for dysuria, urgency and frequency. Negative for vaginal bleeding, vaginal discharge, difficulty urinating and vaginal pain.  Musculoskeletal: Negative for back pain and gait problem.  Skin: Negative for pallor and rash.  Neurological: Negative for dizziness, tremors, weakness, numbness and headaches.  Psychiatric/Behavioral: Negative for confusion and sleep disturbance.       Objective:   Physical Exam  Constitutional: She appears well-developed. No distress.  HENT:  Head: Normocephalic.  Right Ear: External ear normal.  Left Ear: External ear normal.  Nose: Nose normal.  Mouth/Throat: Oropharynx is clear and moist.  Eyes: Conjunctivae normal are normal. Pupils are equal, round, and reactive to light. Right eye exhibits no discharge. Left eye exhibits no discharge.  Neck: Normal range of motion. Neck supple. No JVD present. No tracheal deviation present. No thyromegaly present.  Cardiovascular: Normal  rate, regular rhythm and normal heart sounds.   Pulmonary/Chest: No stridor. No respiratory distress. She has no wheezes.  Abdominal: Soft. Bowel sounds are normal. She exhibits no distension and no mass. There is no tenderness. There is no rebound and no guarding.  Musculoskeletal: She exhibits no edema and no tenderness.  Lymphadenopathy:    She has no cervical adenopathy.  Neurological: She displays normal reflexes. No cranial nerve deficit. She exhibits normal muscle tone. Coordination normal.  Skin: No rash noted. No erythema.  Psychiatric: She has a normal mood and affect. Her behavior is normal. Judgment and thought content normal.   Lab Results  Component Value Date   WBC 7.0 05/18/2011   HGB 14.0 05/18/2011   HCT 43.1 05/18/2011   PLT 298.0 05/18/2011   GLUCOSE 105* 07/12/2012   CHOL 127 05/18/2011   TRIG 13.0 05/18/2011   HDL 76.20 05/18/2011   LDLCALC 48 05/18/2011   ALT 18 03/08/2012   AST 22 03/08/2012   NA 140 07/12/2012   K 4.8 07/12/2012   CL 103 07/12/2012   CREATININE 0.8 07/12/2012   BUN 24* 07/12/2012   CO2 31 07/12/2012   TSH 1.71 07/12/2012          Assessment & Plan:

## 2012-09-19 NOTE — Assessment & Plan Note (Signed)
11/13 per her eye doctor Carotid doppler ASA Improve BP control

## 2012-09-19 NOTE — Assessment & Plan Note (Signed)
Continue with current prescription therapy as reflected on the Med list.  

## 2012-09-19 NOTE — Assessment & Plan Note (Signed)
No relapse off HCTZ 

## 2012-09-19 NOTE — Assessment & Plan Note (Signed)
Chronic She states - it is better (normal) at home. Diovan is too $$$ 11/13 - worse

## 2012-09-19 NOTE — Assessment & Plan Note (Signed)
Resolved

## 2012-09-22 ENCOUNTER — Encounter (INDEPENDENT_AMBULATORY_CARE_PROVIDER_SITE_OTHER): Payer: Medicare Other

## 2012-09-22 DIAGNOSIS — G459 Transient cerebral ischemic attack, unspecified: Secondary | ICD-10-CM | POA: Diagnosis not present

## 2012-09-27 ENCOUNTER — Encounter (INDEPENDENT_AMBULATORY_CARE_PROVIDER_SITE_OTHER): Payer: Medicare Other | Admitting: Ophthalmology

## 2012-09-27 DIAGNOSIS — I10 Essential (primary) hypertension: Secondary | ICD-10-CM | POA: Diagnosis not present

## 2012-09-27 DIAGNOSIS — H35039 Hypertensive retinopathy, unspecified eye: Secondary | ICD-10-CM

## 2012-09-27 DIAGNOSIS — H3530 Unspecified macular degeneration: Secondary | ICD-10-CM

## 2012-09-27 DIAGNOSIS — H348392 Tributary (branch) retinal vein occlusion, unspecified eye, stable: Secondary | ICD-10-CM

## 2012-09-27 DIAGNOSIS — H43819 Vitreous degeneration, unspecified eye: Secondary | ICD-10-CM

## 2012-10-26 ENCOUNTER — Encounter (INDEPENDENT_AMBULATORY_CARE_PROVIDER_SITE_OTHER): Payer: Medicare Other | Admitting: Ophthalmology

## 2012-10-26 DIAGNOSIS — H3530 Unspecified macular degeneration: Secondary | ICD-10-CM

## 2012-10-26 DIAGNOSIS — H348392 Tributary (branch) retinal vein occlusion, unspecified eye, stable: Secondary | ICD-10-CM

## 2012-10-26 DIAGNOSIS — H35039 Hypertensive retinopathy, unspecified eye: Secondary | ICD-10-CM | POA: Diagnosis not present

## 2012-10-26 DIAGNOSIS — H43819 Vitreous degeneration, unspecified eye: Secondary | ICD-10-CM

## 2012-10-26 DIAGNOSIS — I10 Essential (primary) hypertension: Secondary | ICD-10-CM

## 2012-12-13 DIAGNOSIS — H40019 Open angle with borderline findings, low risk, unspecified eye: Secondary | ICD-10-CM | POA: Diagnosis not present

## 2013-01-15 ENCOUNTER — Encounter: Payer: Medicare Other | Admitting: Internal Medicine

## 2013-02-20 ENCOUNTER — Other Ambulatory Visit: Payer: Self-pay | Admitting: Internal Medicine

## 2013-02-23 ENCOUNTER — Ambulatory Visit (INDEPENDENT_AMBULATORY_CARE_PROVIDER_SITE_OTHER): Payer: Medicare Other | Admitting: Ophthalmology

## 2013-02-23 DIAGNOSIS — H348392 Tributary (branch) retinal vein occlusion, unspecified eye, stable: Secondary | ICD-10-CM

## 2013-02-23 DIAGNOSIS — I10 Essential (primary) hypertension: Secondary | ICD-10-CM

## 2013-02-23 DIAGNOSIS — H35039 Hypertensive retinopathy, unspecified eye: Secondary | ICD-10-CM

## 2013-02-23 DIAGNOSIS — H43819 Vitreous degeneration, unspecified eye: Secondary | ICD-10-CM

## 2013-02-23 DIAGNOSIS — H3530 Unspecified macular degeneration: Secondary | ICD-10-CM | POA: Diagnosis not present

## 2013-03-02 ENCOUNTER — Other Ambulatory Visit: Payer: Self-pay | Admitting: Internal Medicine

## 2013-04-03 ENCOUNTER — Other Ambulatory Visit: Payer: Self-pay | Admitting: *Deleted

## 2013-04-03 MED ORDER — TRIAMCINOLONE ACETONIDE 0.5 % EX CREA: TOPICAL_CREAM | Freq: Three times a day (TID) | CUTANEOUS | Status: DC

## 2013-04-23 DIAGNOSIS — T6391XA Toxic effect of contact with unspecified venomous animal, accidental (unintentional), initial encounter: Secondary | ICD-10-CM | POA: Diagnosis not present

## 2013-04-23 DIAGNOSIS — I10 Essential (primary) hypertension: Secondary | ICD-10-CM | POA: Diagnosis not present

## 2013-05-26 DIAGNOSIS — I10 Essential (primary) hypertension: Secondary | ICD-10-CM | POA: Diagnosis not present

## 2013-05-26 DIAGNOSIS — S93609A Unspecified sprain of unspecified foot, initial encounter: Secondary | ICD-10-CM | POA: Diagnosis not present

## 2013-10-11 ENCOUNTER — Other Ambulatory Visit (INDEPENDENT_AMBULATORY_CARE_PROVIDER_SITE_OTHER): Payer: Medicare Other

## 2013-10-11 ENCOUNTER — Ambulatory Visit (INDEPENDENT_AMBULATORY_CARE_PROVIDER_SITE_OTHER): Payer: Medicare Other | Admitting: Internal Medicine

## 2013-10-11 ENCOUNTER — Encounter: Payer: Self-pay | Admitting: Internal Medicine

## 2013-10-11 VITALS — BP 160/80 | HR 80 | Temp 97.7°F | Resp 16 | Wt 151.0 lb

## 2013-10-11 DIAGNOSIS — R635 Abnormal weight gain: Secondary | ICD-10-CM

## 2013-10-11 DIAGNOSIS — Z79899 Other long term (current) drug therapy: Secondary | ICD-10-CM | POA: Diagnosis not present

## 2013-10-11 DIAGNOSIS — Z23 Encounter for immunization: Secondary | ICD-10-CM

## 2013-10-11 DIAGNOSIS — R252 Cramp and spasm: Secondary | ICD-10-CM

## 2013-10-11 DIAGNOSIS — R609 Edema, unspecified: Secondary | ICD-10-CM

## 2013-10-11 DIAGNOSIS — R310 Gross hematuria: Secondary | ICD-10-CM | POA: Diagnosis not present

## 2013-10-11 DIAGNOSIS — M109 Gout, unspecified: Secondary | ICD-10-CM

## 2013-10-11 DIAGNOSIS — M545 Low back pain, unspecified: Secondary | ICD-10-CM

## 2013-10-11 DIAGNOSIS — I10 Essential (primary) hypertension: Secondary | ICD-10-CM

## 2013-10-11 LAB — URINALYSIS, ROUTINE W REFLEX MICROSCOPIC
Bilirubin Urine: NEGATIVE
Ketones, ur: NEGATIVE
Leukocytes, UA: NEGATIVE
Nitrite: NEGATIVE
Specific Gravity, Urine: >=1.03 — AB (ref 1.000–1.030)
Total Protein, Urine: NEGATIVE
Urine Glucose: NEGATIVE
Urobilinogen, UA: 0.2 (ref 0.0–1.0)
pH: 5.5 (ref 5.0–8.0)

## 2013-10-11 LAB — BASIC METABOLIC PANEL
BUN: 21 mg/dL (ref 6–23)
CO2: 29 mEq/L (ref 19–32)
Calcium: 9.1 mg/dL (ref 8.4–10.5)
Chloride: 107 mEq/L (ref 96–112)
Creatinine, Ser: 0.6 mg/dL (ref 0.4–1.2)
GFR: 97.49 mL/min (ref >60.00)
Glucose, Bld: 99 mg/dL (ref 70–99)
Potassium: 3.7 mEq/L (ref 3.5–5.1)
Sodium: 142 mEq/L (ref 135–145)

## 2013-10-11 LAB — MAGNESIUM: Magnesium: 1.9 mg/dL (ref 1.5–2.5)

## 2013-10-11 LAB — TSH: TSH: 1.76 u[IU]/mL (ref 0.35–5.50)

## 2013-10-11 LAB — CK: Total CK: 123 U/L (ref 7–177)

## 2013-10-11 LAB — VITAMIN B12: Vitamin B-12: >1500 pg/mL — ABNORMAL HIGH (ref 211–911)

## 2013-10-11 LAB — URIC ACID: Uric Acid, Serum: 4.7 mg/dL (ref 2.4–7.0)

## 2013-10-11 MED ORDER — LORATADINE 10 MG PO TABS: ORAL_TABLET | ORAL | Status: DC

## 2013-10-11 MED ORDER — GABAPENTIN 100 MG PO CAPS: 100.0000 mg | ORAL_CAPSULE | Freq: Every day | ORAL | Status: DC

## 2013-10-11 MED ORDER — DIAZEPAM 2 MG PO TABS: 2.0000 mg | ORAL_TABLET | Freq: Two times a day (BID) | ORAL | Status: DC

## 2013-10-11 MED ORDER — CARVEDILOL 25 MG PO TABS
25.0000 mg | ORAL_TABLET | Freq: Two times a day (BID) | ORAL | Status: DC
Start: 2013-10-11 — End: 2014-02-10

## 2013-10-11 MED ORDER — CELECOXIB 200 MG PO CAPS: 200.0000 mg | ORAL_CAPSULE | Freq: Two times a day (BID) | ORAL | Status: DC | PRN

## 2013-10-11 MED ORDER — TRIAMCINOLONE ACETONIDE 0.5 % EX CREA: TOPICAL_CREAM | Freq: Three times a day (TID) | CUTANEOUS | Status: DC

## 2013-10-11 MED ORDER — TRETINOIN 0.1 % EX CREA: TOPICAL_CREAM | Freq: Every day | CUTANEOUS | Status: DC

## 2013-10-11 NOTE — Assessment & Plan Note (Signed)
Urology f/up aadviced

## 2013-10-11 NOTE — Progress Notes (Signed)
Pre visit review using our clinic review tool, if applicable. No additional management support is needed unless otherwise documented below in the visit note. 

## 2013-10-11 NOTE — Assessment & Plan Note (Signed)
Wt Readings from Last 3 Encounters:  10/11/13 151 lb (68.493 kg)  09/19/12 147 lb (66.679 kg)  07/12/12 143 lb 8 oz (65.091 kg)

## 2013-10-11 NOTE — Progress Notes (Signed)
   Subjective:  C/o cramps from BP med  Hypertension Pertinent negatives include no headaches.    The patient presents for a follow-up of  chronic hypertension, chronic OA, obesity, edema. She had ben taking Diovan 160 mg/d. C/o a "CVA in the eye per her eye doctor". Brngs in BP record - -- elevated BP     BP Readings from Last 3 Encounters:  10/11/13 160/80  09/19/12 178/80  07/12/12 150/92   Wt Readings from Last 3 Encounters:  10/11/13 151 lb (68.493 kg)  09/19/12 147 lb (66.679 kg)  07/12/12 143 lb 8 oz (65.091 kg)      Review of Systems  Constitutional: Negative for fever, chills, activity change, appetite change, fatigue and unexpected weight change.  HENT: Negative for congestion, ear pain, mouth sores and sinus pressure.   Eyes: Negative for visual disturbance.  Respiratory: Negative for cough and chest tightness.   Gastrointestinal: Negative for nausea and abdominal pain.  Genitourinary: Positive for dysuria, urgency and frequency. Negative for vaginal bleeding, vaginal discharge, difficulty urinating and vaginal pain.  Musculoskeletal: Negative for back pain and gait problem.  Skin: Negative for pallor and rash.  Neurological: Negative for dizziness, tremors, weakness, numbness and headaches.  Psychiatric/Behavioral: Negative for confusion and sleep disturbance.       Objective:   Physical Exam  Constitutional: She appears well-developed. No distress.  HENT:  Head: Normocephalic.  Right Ear: External ear normal.  Left Ear: External ear normal.  Nose: Nose normal.  Mouth/Throat: Oropharynx is clear and moist.  Eyes: Conjunctivae are normal. Pupils are equal, round, and reactive to light. Right eye exhibits no discharge. Left eye exhibits no discharge.  Neck: Normal range of motion. Neck supple. No JVD present. No tracheal deviation present. No thyromegaly present.  Cardiovascular: Normal rate, regular rhythm and normal heart sounds.   Pulmonary/Chest: No  stridor. No respiratory distress. She has no wheezes.  Abdominal: Soft. Bowel sounds are normal. She exhibits no distension and no mass. There is no tenderness. There is no rebound and no guarding.  Musculoskeletal: She exhibits no edema and no tenderness.  Lymphadenopathy:    She has no cervical adenopathy.  Neurological: She displays normal reflexes. No cranial nerve deficit. She exhibits normal muscle tone. Coordination normal.  Skin: No rash noted. No erythema.  Psychiatric: She has a normal mood and affect. Her behavior is normal. Judgment and thought content normal.   Lab Results  Component Value Date   WBC 7.0 09/19/2012   HGB 14.8 09/19/2012   HCT 45.6 09/19/2012   PLT 288.0 09/19/2012   GLUCOSE 105* 09/19/2012   CHOL 149 09/19/2012   TRIG 18.0 09/19/2012   HDL 91.40 09/19/2012   LDLCALC 54 09/19/2012   ALT 18 03/08/2012   AST 22 03/08/2012   NA 139 09/19/2012   K 4.3 09/19/2012   CL 103 09/19/2012   CREATININE 0.6 09/19/2012   BUN 21 09/19/2012   CO2 31 09/19/2012   TSH 1.71 09/19/2012          Assessment & Plan:

## 2013-10-11 NOTE — Assessment & Plan Note (Signed)
No relapse labs 

## 2013-10-11 NOTE — Assessment & Plan Note (Signed)
Resolved

## 2013-10-11 NOTE — Assessment & Plan Note (Signed)
Doing well 

## 2013-10-11 NOTE — Assessment & Plan Note (Signed)
Start Coreg

## 2013-10-11 NOTE — Patient Instructions (Signed)
Try Gabapentin for cramps

## 2013-10-11 NOTE — Assessment & Plan Note (Signed)
Switch to Coreg Labs Gabapentin at hs prn

## 2013-10-23 ENCOUNTER — Telehealth: Payer: Self-pay | Admitting: Internal Medicine

## 2013-10-23 MED ORDER — CIPROFLOXACIN HCL 250 MG PO TABS: 250.0000 mg | ORAL_TABLET | Freq: Two times a day (BID) | ORAL | Status: DC

## 2013-10-23 NOTE — Telephone Encounter (Signed)
Pt request medication for bladder infection. Please advise.

## 2013-10-23 NOTE — Telephone Encounter (Signed)
Pt informed

## 2013-10-23 NOTE — Telephone Encounter (Signed)
Ok Cipro Thx 

## 2013-10-26 ENCOUNTER — Other Ambulatory Visit: Payer: Self-pay | Admitting: Internal Medicine

## 2013-11-05 DIAGNOSIS — I10 Essential (primary) hypertension: Secondary | ICD-10-CM | POA: Diagnosis not present

## 2013-11-05 DIAGNOSIS — J3489 Other specified disorders of nose and nasal sinuses: Secondary | ICD-10-CM | POA: Diagnosis not present

## 2013-11-05 DIAGNOSIS — R609 Edema, unspecified: Secondary | ICD-10-CM | POA: Diagnosis not present

## 2013-11-08 DIAGNOSIS — H40019 Open angle with borderline findings, low risk, unspecified eye: Secondary | ICD-10-CM | POA: Diagnosis not present

## 2013-11-08 DIAGNOSIS — H35379 Puckering of macula, unspecified eye: Secondary | ICD-10-CM | POA: Diagnosis not present

## 2013-11-08 DIAGNOSIS — H35039 Hypertensive retinopathy, unspecified eye: Secondary | ICD-10-CM | POA: Diagnosis not present

## 2013-11-08 DIAGNOSIS — H01009 Unspecified blepharitis unspecified eye, unspecified eyelid: Secondary | ICD-10-CM | POA: Diagnosis not present

## 2013-11-08 DIAGNOSIS — H353190 Nonexudative age-related macular degeneration, unspecified eye, stage unspecified: Secondary | ICD-10-CM | POA: Diagnosis not present

## 2013-11-08 DIAGNOSIS — H35349 Macular cyst, hole, or pseudohole, unspecified eye: Secondary | ICD-10-CM | POA: Diagnosis not present

## 2013-11-08 DIAGNOSIS — H04129 Dry eye syndrome of unspecified lacrimal gland: Secondary | ICD-10-CM | POA: Diagnosis not present

## 2013-11-08 DIAGNOSIS — H538 Other visual disturbances: Secondary | ICD-10-CM | POA: Diagnosis not present

## 2013-11-12 ENCOUNTER — Encounter (INDEPENDENT_AMBULATORY_CARE_PROVIDER_SITE_OTHER): Payer: Medicare Other | Admitting: Ophthalmology

## 2013-11-12 DIAGNOSIS — H35039 Hypertensive retinopathy, unspecified eye: Secondary | ICD-10-CM | POA: Diagnosis not present

## 2013-11-12 DIAGNOSIS — H3530 Unspecified macular degeneration: Secondary | ICD-10-CM | POA: Diagnosis not present

## 2013-11-12 DIAGNOSIS — H348392 Tributary (branch) retinal vein occlusion, unspecified eye, stable: Secondary | ICD-10-CM

## 2013-11-12 DIAGNOSIS — I10 Essential (primary) hypertension: Secondary | ICD-10-CM | POA: Diagnosis not present

## 2013-11-12 DIAGNOSIS — H43819 Vitreous degeneration, unspecified eye: Secondary | ICD-10-CM

## 2013-11-27 ENCOUNTER — Ambulatory Visit: Payer: Medicare Other | Admitting: Internal Medicine

## 2013-11-30 ENCOUNTER — Other Ambulatory Visit (INDEPENDENT_AMBULATORY_CARE_PROVIDER_SITE_OTHER): Payer: Medicare Other

## 2013-11-30 ENCOUNTER — Ambulatory Visit (INDEPENDENT_AMBULATORY_CARE_PROVIDER_SITE_OTHER): Payer: Medicare Other | Admitting: Internal Medicine

## 2013-11-30 ENCOUNTER — Encounter: Payer: Self-pay | Admitting: Internal Medicine

## 2013-11-30 ENCOUNTER — Ambulatory Visit (INDEPENDENT_AMBULATORY_CARE_PROVIDER_SITE_OTHER)
Admission: RE | Admit: 2013-11-30 | Discharge: 2013-11-30 | Disposition: A | Discharge status: Home / Self Care | Payer: Medicare Other | Source: Ambulatory Visit | Attending: Internal Medicine | Admitting: Internal Medicine

## 2013-11-30 VITALS — BP 130/62 | HR 72 | Temp 98.4°F | Resp 16 | Wt 147.0 lb

## 2013-11-30 DIAGNOSIS — R079 Chest pain, unspecified: Secondary | ICD-10-CM

## 2013-11-30 DIAGNOSIS — J069 Acute upper respiratory infection, unspecified: Secondary | ICD-10-CM | POA: Diagnosis not present

## 2013-11-30 DIAGNOSIS — R5381 Other malaise: Secondary | ICD-10-CM

## 2013-11-30 DIAGNOSIS — R5383 Other fatigue: Secondary | ICD-10-CM

## 2013-11-30 DIAGNOSIS — R059 Cough, unspecified: Secondary | ICD-10-CM | POA: Diagnosis not present

## 2013-11-30 DIAGNOSIS — R05 Cough: Secondary | ICD-10-CM | POA: Diagnosis not present

## 2013-11-30 DIAGNOSIS — I10 Essential (primary) hypertension: Secondary | ICD-10-CM | POA: Diagnosis not present

## 2013-11-30 LAB — CBC WITH DIFFERENTIAL/PLATELET
Basophils Absolute: 0 10*3/uL (ref 0.0–0.1)
Basophils Relative: 0.4 % (ref 0.0–3.0)
Eosinophils Absolute: 0.1 10*3/uL (ref 0.0–0.7)
Eosinophils Relative: 1.5 % (ref 0.0–5.0)
HCT: 44.4 % (ref 36.0–46.0)
Hemoglobin: 14 g/dL (ref 12.0–15.0)
Lymphocytes Relative: 24.8 % (ref 12.0–46.0)
Lymphs Abs: 1.6 10*3/uL (ref 0.7–4.0)
MCHC: 31.6 g/dL (ref 30.0–36.0)
MCV: 90 fl (ref 78.0–100.0)
Monocytes Absolute: 0.6 10*3/uL (ref 0.1–1.0)
Monocytes Relative: 8.8 % (ref 3.0–12.0)
Neutro Abs: 4.2 10*3/uL (ref 1.4–7.7)
Neutrophils Relative %: 64.5 % (ref 43.0–77.0)
Platelets: 253 10*3/uL (ref 150.0–400.0)
RBC: 4.93 Mil/uL (ref 3.87–5.11)
RDW: 14.3 % (ref 11.5–14.6)
WBC: 6.5 10*3/uL (ref 4.5–10.5)

## 2013-11-30 LAB — URINALYSIS
Bilirubin Urine: NEGATIVE
Hgb urine dipstick: NEGATIVE
Ketones, ur: NEGATIVE
Leukocytes, UA: NEGATIVE
Nitrite: NEGATIVE
Specific Gravity, Urine: >=1.03 — AB (ref 1.000–1.030)
Total Protein, Urine: NEGATIVE
Urine Glucose: NEGATIVE
Urobilinogen, UA: 0.2 (ref 0.0–1.0)
pH: 5.5 (ref 5.0–8.0)

## 2013-11-30 LAB — TSH: TSH: 1.58 u[IU]/mL (ref 0.35–5.50)

## 2013-11-30 MED ORDER — AZITHROMYCIN 250 MG PO TABS: ORAL_TABLET | ORAL | Status: DC

## 2013-11-30 NOTE — Progress Notes (Signed)
Patient ID: Erin [name], female   DOB: [birth year], 79 y.o.   MRN: [id number]   Subjective:  C/o cramps from BP med  Hypertension Associated symptoms include chest pain. Pertinent negatives include no headaches.  URI  This is a new problem. The current episode started 1 to 4 weeks ago. The problem has been gradually worsening. There has been no fever. Associated symptoms include chest pain, coughing and dysuria. Pertinent negatives include no abdominal pain, congestion, ear pain, headaches, nausea, rash or sinus pain. She has tried acetaminophen for the symptoms. The treatment provided no relief.  Yellow d/c  The patient presents for a follow-up of  chronic hypertension, chronic OA, obesity, edema. She had ben taking Diovan 160 mg/d. C/o a "CVA in the eye per her eye doctor". Brngs in BP record - -- elevated BP     BP Readings from Last 3 Encounters:  11/30/13 130/62  10/11/13 160/80  09/19/12 178/80   Wt Readings from Last 3 Encounters:  11/30/13 147 lb (66.679 kg)  10/11/13 151 lb (68.493 kg)  09/19/12 147 lb (66.679 kg)      Review of Systems  Constitutional: Negative for fever, chills, activity change, appetite change, fatigue and unexpected weight change.  HENT: Negative for congestion, ear pain, mouth sores and sinus pressure.   Eyes: Negative for visual disturbance.  Respiratory: Positive for cough. Negative for chest tightness.   Cardiovascular: Positive for chest pain.  Gastrointestinal: Negative for nausea and abdominal pain.  Genitourinary: Positive for dysuria, urgency and frequency. Negative for vaginal bleeding, vaginal discharge, difficulty urinating and vaginal pain.  Musculoskeletal: Negative for back pain and gait problem.  Skin: Negative for pallor and rash.  Neurological: Negative for dizziness, tremors, weakness, numbness and headaches.  Psychiatric/Behavioral: Negative for confusion and sleep disturbance.       Objective:   Physical Exam   Constitutional: She appears well-developed. No distress.  HENT:  Head: Normocephalic.  Right Ear: External ear normal.  Left Ear: External ear normal.  Nose: Nose normal.  Mouth/Throat: Oropharynx is clear and moist.  eryth throat  Eyes: Conjunctivae are normal. Pupils are equal, round, and reactive to light. Right eye exhibits no discharge. Left eye exhibits no discharge.  Neck: Normal range of motion. Neck supple. No JVD present. No tracheal deviation present. No thyromegaly present.  Cardiovascular: Normal rate, regular rhythm and normal heart sounds.   Pulmonary/Chest: No stridor. No respiratory distress. She has no wheezes.  Abdominal: Soft. Bowel sounds are normal. She exhibits no distension and no mass. There is no tenderness. There is no rebound and no guarding.  Musculoskeletal: She exhibits no edema and no tenderness.  Lymphadenopathy:    She has no cervical adenopathy.  Neurological: She displays normal reflexes. No cranial nerve deficit. She exhibits normal muscle tone. Coordination normal.  Skin: No rash noted. No erythema.  Psychiatric: She has a normal mood and affect. Her behavior is normal. Judgment and thought content normal.   Lab Results  Component Value Date   WBC 7.0 09/19/2012   HGB 14.8 09/19/2012   HCT 45.6 09/19/2012   PLT 288.0 09/19/2012   GLUCOSE 99 10/11/2013   CHOL 149 09/19/2012   TRIG 18.0 09/19/2012   HDL 91.40 09/19/2012   LDLCALC 54 09/19/2012   ALT 18 03/08/2012   AST 22 03/08/2012   NA 142 10/11/2013   K 3.7 10/11/2013   CL 107 10/11/2013   CREATININE 0.6 10/11/2013   BUN 21 10/11/2013   CO2 29 10/11/2013  TSH 1.76 10/11/2013          Assessment & Plan:

## 2013-11-30 NOTE — Assessment & Plan Note (Addendum)
CXR EKG CL if CP recurred

## 2013-11-30 NOTE — Progress Notes (Signed)
Pre visit review using our clinic review tool, if applicable. No additional management support is needed unless otherwise documented below in the visit note. 

## 2013-11-30 NOTE — Patient Instructions (Signed)
Use over-the-counter  "cold" medicines  such as  "Afrin" nasal spray for nasal congestion as directed instead. Use" Delsym" or" Robitussin" cough syrup varietis for cough.  You can use plain "Tylenol" or "Advil" for fever, chills and achyness.  Please, make an appointment if you are not better or if you're worse.  

## 2013-11-30 NOTE — Assessment & Plan Note (Signed)
CXR Zpac 

## 2013-12-01 NOTE — Assessment & Plan Note (Signed)
Continue with current prescription therapy as reflected on the Med list.  

## 2013-12-03 LAB — HEPATIC FUNCTION PANEL
ALT: 48 U/L — ABNORMAL HIGH (ref 0–35)
AST: 48 U/L — ABNORMAL HIGH (ref 0–37)
Albumin: 3.9 g/dL (ref 3.5–5.2)
Alkaline Phosphatase: 81 U/L (ref 39–117)
Bilirubin, Direct: 0.1 mg/dL (ref 0.0–0.3)
Total Bilirubin: 0.5 mg/dL (ref 0.3–1.2)
Total Protein: 7 g/dL (ref 6.0–8.3)

## 2013-12-03 LAB — BASIC METABOLIC PANEL
BUN: 16 mg/dL (ref 6–23)
CO2: 30 mEq/L (ref 19–32)
Calcium: 8.8 mg/dL (ref 8.4–10.5)
Chloride: 104 mEq/L (ref 96–112)
Creatinine, Ser: 0.7 mg/dL (ref 0.4–1.2)
GFR: 86.29 mL/min (ref >60.00)
Glucose, Bld: 92 mg/dL (ref 70–99)
Potassium: 3.5 mEq/L (ref 3.5–5.1)
Sodium: 140 mEq/L (ref 135–145)

## 2013-12-04 ENCOUNTER — Telehealth: Payer: Self-pay | Admitting: *Deleted

## 2013-12-04 NOTE — Telephone Encounter (Signed)
Spouse phoned requesting xray results from 11/30/13.  Please advise.  CB# [phone]

## 2013-12-04 NOTE — Telephone Encounter (Signed)
Notes Recorded by Cassandria Anger, MD on 11/30/2013 at 5:07 PM Erin [name], please, inform patient that CXR showed bronchitis Thx

## 2013-12-05 ENCOUNTER — Ambulatory Visit (INDEPENDENT_AMBULATORY_CARE_PROVIDER_SITE_OTHER): Payer: Medicare Other | Admitting: Ophthalmology

## 2013-12-05 NOTE — Telephone Encounter (Signed)
Pt's spouse informed 

## 2013-12-05 NOTE — Telephone Encounter (Signed)
Please review

## 2013-12-06 ENCOUNTER — Other Ambulatory Visit: Payer: Self-pay | Admitting: Internal Medicine

## 2013-12-12 ENCOUNTER — Telehealth: Payer: Self-pay | Admitting: Internal Medicine

## 2013-12-12 NOTE — Telephone Encounter (Signed)
Pt returned call to Stacey.

## 2013-12-14 NOTE — Telephone Encounter (Signed)
Left detailed mess informing pt to call back re: recent labs.

## 2014-01-06 DIAGNOSIS — J209 Acute bronchitis, unspecified: Secondary | ICD-10-CM | POA: Diagnosis not present

## 2014-01-06 DIAGNOSIS — J019 Acute sinusitis, unspecified: Secondary | ICD-10-CM | POA: Diagnosis not present

## 2014-01-06 DIAGNOSIS — R011 Cardiac murmur, unspecified: Secondary | ICD-10-CM | POA: Diagnosis not present

## 2014-01-06 DIAGNOSIS — H1030 Unspecified acute conjunctivitis, unspecified eye: Secondary | ICD-10-CM | POA: Diagnosis not present

## 2014-01-09 ENCOUNTER — Ambulatory Visit: Payer: Medicare Other | Admitting: Internal Medicine

## 2014-01-09 DIAGNOSIS — H10029 Other mucopurulent conjunctivitis, unspecified eye: Secondary | ICD-10-CM | POA: Diagnosis not present

## 2014-02-10 ENCOUNTER — Other Ambulatory Visit: Payer: Self-pay | Admitting: Internal Medicine

## 2014-03-18 ENCOUNTER — Other Ambulatory Visit: Payer: Self-pay | Admitting: Internal Medicine

## 2014-05-06 ENCOUNTER — Ambulatory Visit (INDEPENDENT_AMBULATORY_CARE_PROVIDER_SITE_OTHER): Payer: Medicare Other | Admitting: Ophthalmology

## 2014-05-06 DIAGNOSIS — H3530 Unspecified macular degeneration: Secondary | ICD-10-CM

## 2014-05-06 DIAGNOSIS — I10 Essential (primary) hypertension: Secondary | ICD-10-CM | POA: Diagnosis not present

## 2014-05-06 DIAGNOSIS — H35379 Puckering of macula, unspecified eye: Secondary | ICD-10-CM | POA: Diagnosis not present

## 2014-05-06 DIAGNOSIS — H35039 Hypertensive retinopathy, unspecified eye: Secondary | ICD-10-CM

## 2014-05-06 DIAGNOSIS — H348392 Tributary (branch) retinal vein occlusion, unspecified eye, stable: Secondary | ICD-10-CM

## 2014-05-06 DIAGNOSIS — H43819 Vitreous degeneration, unspecified eye: Secondary | ICD-10-CM

## 2014-05-16 ENCOUNTER — Ambulatory Visit (INDEPENDENT_AMBULATORY_CARE_PROVIDER_SITE_OTHER): Payer: Medicare Other | Admitting: Ophthalmology

## 2014-06-12 ENCOUNTER — Other Ambulatory Visit: Payer: Self-pay | Admitting: *Deleted

## 2014-06-12 MED ORDER — IBUPROFEN 800 MG PO TABS: 800.0000 mg | ORAL_TABLET | Freq: Three times a day (TID) | ORAL | Status: DC

## 2014-07-15 DIAGNOSIS — N390 Urinary tract infection, site not specified: Secondary | ICD-10-CM | POA: Diagnosis not present

## 2014-07-25 ENCOUNTER — Ambulatory Visit (INDEPENDENT_AMBULATORY_CARE_PROVIDER_SITE_OTHER): Payer: Medicare Other | Admitting: Internal Medicine

## 2014-07-25 ENCOUNTER — Encounter: Payer: Self-pay | Admitting: Internal Medicine

## 2014-07-25 ENCOUNTER — Other Ambulatory Visit: Payer: Medicare Other

## 2014-07-25 VITALS — BP 162/70 | HR 60 | Temp 98.3°F | Resp 18 | Ht 63.0 in | Wt 144.8 lb

## 2014-07-25 DIAGNOSIS — R300 Dysuria: Secondary | ICD-10-CM | POA: Diagnosis not present

## 2014-07-25 DIAGNOSIS — N3090 Cystitis, unspecified without hematuria: Secondary | ICD-10-CM

## 2014-07-25 LAB — POCT URINALYSIS DIPSTICK
Bilirubin, UA: NEGATIVE
Blood, UA: POSITIVE
Glucose, UA: NEGATIVE
Ketones, UA: NEGATIVE
Nitrite, UA: POSITIVE
Spec Grav, UA: 1.02
Urobilinogen, UA: NEGATIVE
pH, UA: 6.5

## 2014-07-25 MED ORDER — CIPROFLOXACIN HCL 250 MG PO TABS: 250.0000 mg | ORAL_TABLET | Freq: Two times a day (BID) | ORAL | Status: DC

## 2014-07-25 NOTE — Patient Instructions (Signed)
We will give you ciprofloxacin for your urinary infection. Take 1 pill twice a day for 1 week to clear the infection. If you are not better please call us back.  Stay hydrated with plenty of fluids to help flush the kidneys.  Urinary Tract Infection Urinary tract infections (UTIs) can develop anywhere along your urinary tract. Your urinary tract is your body's drainage system for removing wastes and extra water. Your urinary tract includes two kidneys, two ureters, a bladder, and a urethra. Your kidneys are a pair of bean-shaped organs. Each kidney is about the size of your fist. They are located below your ribs, one on each side of your spine. CAUSES Infections are caused by microbes, which are microscopic organisms, including fungi, viruses, and bacteria. These organisms are so small that they can only be seen through a microscope. Bacteria are the microbes that most commonly cause UTIs. SYMPTOMS  Symptoms of UTIs may vary by age and gender of the patient and by the location of the infection. Symptoms in young women typically include a frequent and intense urge to urinate and a painful, burning feeling in the bladder or urethra during urination. Older women and men are more likely to be tired, shaky, and weak and have muscle aches and abdominal pain. A fever may mean the infection is in your kidneys. Other symptoms of a kidney infection include pain in your back or sides below the ribs, nausea, and vomiting. DIAGNOSIS To diagnose a UTI, your caregiver will ask you about your symptoms. Your caregiver also will ask to provide a urine sample. The urine sample will be tested for bacteria and white blood cells. White blood cells are made by your body to help fight infection. TREATMENT  Typically, UTIs can be treated with medication. Because most UTIs are caused by a bacterial infection, they usually can be treated with the use of antibiotics. The choice of antibiotic and length of treatment depend on your  symptoms and the type of bacteria causing your infection. HOME CARE INSTRUCTIONS  If you were prescribed antibiotics, take them exactly as your caregiver instructs you. Finish the medication even if you feel better after you have only taken some of the medication.  Drink enough water and fluids to keep your urine clear or pale yellow.  Avoid caffeine, tea, and carbonated beverages. They tend to irritate your bladder.  Empty your bladder often. Avoid holding urine for long periods of time.  Empty your bladder before and after sexual intercourse.  After a bowel movement, women should cleanse from front to back. Use each tissue only once. SEEK MEDICAL CARE IF:   You have back pain.  You develop a fever.  Your symptoms do not begin to resolve within 3 days. SEEK IMMEDIATE MEDICAL CARE IF:   You have severe back pain or lower abdominal pain.  You develop chills.  You have nausea or vomiting.  You have continued burning or discomfort with urination. MAKE SURE YOU:   Understand these instructions.  Will watch your condition.  Will get help right away if you are not doing well or get worse. Document Released: 07/21/2005 Document Revised: 04/11/2012 Document Reviewed: 11/19/2011 [facility] Patient Information 2015 [place], [place]. This information is not intended to replace advice given to you by your health care provider. Make sure you discuss any questions you have with your health care provider.

## 2014-07-25 NOTE — Assessment & Plan Note (Signed)
Given symptoms and failure of previous antibiotic (cefpodoxime) will use ciprofloxacin BID for 7 days. Urine dipstick positive for signs of infection and urine culture ordered.

## 2014-07-25 NOTE — Progress Notes (Signed)
Pre visit review using our clinic review tool, if applicable. No additional management support is needed unless otherwise documented below in the visit note. 

## 2014-07-25 NOTE — Progress Notes (Signed)
   Subjective:    Patient ID: Erin [name], female    DOB: 09/26/1935, 79 y.o.   MRN: [id number]  HPI The patient is a 79 YO female who is coming in for an acute visit for UTI symptoms. She is having some discomfort and frequency. She was given a medication from an urgent care doctor but that has not helped and she finished course 3-4 days ago. She denies fevers, chills. She denies nausea, abdominal pain.   Review of Systems  Constitutional: Negative for fever, diaphoresis, activity change, appetite change and fatigue.  Respiratory: Negative for chest tightness and shortness of breath.   Cardiovascular: Negative for chest pain.  Gastrointestinal: Negative for nausea, vomiting, abdominal pain and abdominal distention.  Genitourinary: Positive for urgency, frequency and enuresis. Negative for flank pain.  Musculoskeletal: Negative for back pain and gait problem.      Objective:   Physical Exam  Constitutional: She appears well-developed and well-nourished. No distress.  Cardiovascular: Normal rate and regular rhythm.   Pulmonary/Chest: Effort normal and breath sounds normal.  Abdominal: Soft. Bowel sounds are normal. She exhibits no distension. There is no tenderness. There is no rebound.  No flank tenderness  Skin: Skin is warm and dry.      Assessment & Plan:

## 2014-07-28 LAB — URINE CULTURE: Colony Count: >=100000

## 2014-09-03 ENCOUNTER — Ambulatory Visit (INDEPENDENT_AMBULATORY_CARE_PROVIDER_SITE_OTHER): Payer: Medicare Other | Admitting: Nurse Practitioner

## 2014-09-03 ENCOUNTER — Encounter: Payer: Self-pay | Admitting: Nurse Practitioner

## 2014-09-03 VITALS — BP 150/74 | HR 57 | Temp 97.6°F | Ht 63.0 in | Wt 140.0 lb

## 2014-09-03 DIAGNOSIS — N3001 Acute cystitis with hematuria: Secondary | ICD-10-CM

## 2014-09-03 DIAGNOSIS — N3000 Acute cystitis without hematuria: Secondary | ICD-10-CM | POA: Diagnosis not present

## 2014-09-03 MED ORDER — CIPROFLOXACIN-CIPROFLOX HCL ER 500 MG PO TB24: 500.0000 mg | ORAL_TABLET | Freq: Every day | ORAL | Status: DC

## 2014-09-03 NOTE — Progress Notes (Addendum)
   Subjective:    Patient ID: Erin [name], female    DOB: June 02, 1935, 79 y.o.   MRN: [id number]  HPI Comments: Erin [name] was treated for UTI twice in last 6 weeks. She had treatment failure with 3 day ABX course. Last course was cipro 250 bid 7day. Urine Cx showed sensitivity to cipro.  Urinary Tract Infection  This is a recurrent problem. The current episode started 1 to 4 weeks ago (3 weeks). The problem occurs every urination. The problem has been waxing and waning. The quality of the pain is described as burning. The pain is mild. There has been no fever. Associated symptoms include frequency, hesitancy and urgency. Pertinent negatives include no chills, discharge, flank pain, hematuria, nausea, sweats or vomiting. Treatments tried: see comments. taking OTC AZO. The treatment provided mild relief. Her past medical history is significant for recurrent UTIs.      Review of Systems  Constitutional: Negative for fever, chills and fatigue.  Gastrointestinal: Negative for nausea and vomiting.  Genitourinary: Positive for dysuria, hesitancy, urgency, frequency and difficulty urinating. Negative for hematuria, flank pain and vaginal discharge.  Musculoskeletal: Positive for back pain (chronic back pain, Gx back surgery).       Objective:   Physical Exam  Constitutional: She is oriented to person, place, and time.  HENT:  Head: Normocephalic and atraumatic.  Eyes: Conjunctivae are normal. Right eye exhibits no discharge. Left eye exhibits no discharge.  Cardiovascular: Normal rate.   Pulmonary/Chest: Effort normal. No respiratory distress.  Abdominal: Soft. She exhibits no distension and no mass. There is tenderness (suprapubic). There is no rebound and no guarding.  Musculoskeletal: She exhibits no tenderness (no CVA tenderness).  Neurological: She is alert and oriented to person, place, and time.  Skin: Skin is warm and dry.  Psychiatric: She has a normal mood and affect. Her behavior  is normal. Judgment and thought content normal.  Vitals reviewed.         Assessment & Plan:  1. Acute cystitis with hematuria Recurrent ua- pos nites, blood, SG 1.025, strong odor Culture pending - Ciprofloxacin-Ciproflox HCl 500 MG tablet; Take 1 tablet (500 mg total) by mouth daily.  Dispense: 3 tablet; Refill: 0 See patient instructions for complete plan. F/u in 4 days if not feeling better, o/w 3 weeks to recheck urine.

## 2014-09-03 NOTE — Patient Instructions (Signed)
Start antibiotic. Eat yogurt daily to help prevent antibiotic -associated diarrhea.  Increase fluids to flush kidneys.   Take AZO for bladder pressure.  Let us know if not feeling better in 4 days.  Return in 3 to 4 weeks to recheck urine.

## 2014-09-03 NOTE — Addendum Note (Signed)
Addended by: Kathlen Mody, Janann August [facility] on: 09/03/2014 09:31 AM   Modules accepted: Orders

## 2014-09-03 NOTE — Progress Notes (Signed)
Pre visit review using our clinic review tool, if applicable. No additional management support is needed unless otherwise documented below in the visit note. 

## 2014-09-06 ENCOUNTER — Telehealth: Payer: Self-pay | Admitting: Nurse Practitioner

## 2014-09-06 LAB — URINE CULTURE: Colony Count: >=100000

## 2014-09-06 NOTE — Telephone Encounter (Signed)
pls call pt: Ask if feeling better-uti

## 2014-09-06 NOTE — Telephone Encounter (Signed)
LMOVM for pt to return call 

## 2014-09-11 NOTE — Telephone Encounter (Signed)
Left message on pt's vm for cb.

## 2014-09-18 ENCOUNTER — Encounter: Payer: Self-pay | Admitting: Family Medicine

## 2014-09-18 ENCOUNTER — Ambulatory Visit (INDEPENDENT_AMBULATORY_CARE_PROVIDER_SITE_OTHER): Payer: Medicare Other | Admitting: Family Medicine

## 2014-09-18 VITALS — BP 117/60 | HR 60 | Temp 97.9°F | Resp 18 | Ht 63.0 in | Wt 143.0 lb

## 2014-09-18 DIAGNOSIS — M25562 Pain in left knee: Secondary | ICD-10-CM

## 2014-09-18 DIAGNOSIS — S83002A Unspecified subluxation of left patella, initial encounter: Secondary | ICD-10-CM

## 2014-09-18 DIAGNOSIS — M222X2 Patellofemoral disorders, left knee: Secondary | ICD-10-CM | POA: Diagnosis not present

## 2014-09-18 NOTE — Patient Instructions (Signed)
Patellofemoral Syndrome If you have had pain in the front of your knee for a long time, chances are good that you have patellofemoral syndrome. The word patella refers to the kneecap. Femoral (or femur) refers to the thigh bone. That is the bone the kneecap sits on. The kneecap is shaped like a triangle. Its job is to protect the knee and to improve the efficiency of your thigh muscles (quadriceps). The underside of the kneecap is made of smooth tissue (cartilage). This lets the kneecap slide up and down as the knee moves. Sometimes this cartilage becomes soft. Your healthcare provider may say the cartilage breaks down. That is patellofemoral syndrome. It can affect one knee, or both. The condition is sometimes called patellofemoral pain syndrome. That is because the condition is painful. The pain usually gets worse with activity. Sitting for a long time with the knee bent also makes the pain worse. It usually gets better with rest and proper treatment. CAUSES  No one is sure why some people develop this problem and others do not. Runners often get it. One name for the condition is "runner's knee." However, some people run for years and never have knee pain. Certain things seem to make patellofemoral syndrome more likely. They include:  Moving out of alignment. The kneecap is supposed to move in a straight line when the thigh muscle pulls on it. Sometimes the kneecap moves in poor alignment. That can make the knee swell and hurt. Some experts believe it also wears down the cartilage.  Injury to the kneecap.  Strain on the knee. This may occur during sports activity. Soccer, running, skiing and cycling can put excess stress on the knee.  Being flat-footed or knock-kneed. SYMPTOMS   Knee pain.  Pain under the kneecap. This is usually a dull, aching pain.  Pain in the knee when doing certain things: squatting, kneeling, going up or down stairs.  Pain in the knee when you stand up after sitting down  for awhile.  Tightness in the knee.  Loss of muscle strength in the thigh.  Swelling of the knee. DIAGNOSIS  Healthcare providers often send people with knee pain to an orthopedic caregiver. This person has special training to treat problems with bones and joints. To decide what is causing your knee pain, your caregiver will probably:  Do a physical exam. This will probably include:  Asking about symptoms you have noticed.  Asking about your activities and any injuries.  Feeling your knee. Moving it. This will help test the knee's strength. It will also check alignment (whether the knee and leg are aligned normally).  Order some tests, such as:  Imaging tests. They create pictures of the inside of the knee. Tests may include:  X-rays.  Computed tomography (CT) scan. This uses X-rays and a computer to show more detail.  Magnetic resonance imaging (MRI). This test uses magnets, radio waves and a computer to make pictures. TREATMENT   Medication is almost always used first. It can relieve pain. It also can reduce swelling. Non-steroidal anti-inflammatory medicines (called NSAIDs) are usually suggested. Sometimes a stronger form is needed. A stronger form would require a prescription.  Other treatment may be needed after the swelling goes down. Possibilities include:  Exercise. Certain exercises can make the muscles around the knee stronger which decreases the pressure on the knee cap. This includes the thigh muscle. Certain exercises also may be suggested to increase your flexibility.  A knee brace. This gives the knee extra support   and helps align the movement of the knee cap.  Orthotics. These are special shoe inserts. They can help keep your leg and knee aligned.  Surgery is sometimes needed. This is rare. Options include:  Arthroscopy. The surgeon uses a special tool to remove any damaged pieces of the kneecap. Only a few small incisions (cuts) are needed.  Realignment.  This is open surgery. The goals are to reduce pressure and fix the way the kneecap moves. HOME CARE INSTRUCTIONS   Take any medication prescribed by your healthcare provider. Follow the directions carefully.  If your knee is swollen:  Put ice or cold packs on it. Do this for 20 to 30 minutes, 3 to 4 times a day.  Keep the knee raised. Make sure it is supported. Put a pillow under it.  Rest your knee. For example, take the elevator instead of the stairs for awhile. Or, take a break from sports activity that strain your knee. Try walking or swimming instead.  Whenever you are active:  Use an elastic bandage on your knee. This gives it support.  After any activity, put ice or cold packs on your knees. Do this for about 10 to 20 minutes.  Make sure you wear shoes that give good support. Make sure they are not worn down. The heels should not slant in or out. SEEK MEDICAL CARE IF:   Knee pain gets worse. Or it does not go away, even after taking pain medicine.  Swelling does not go down.  Your thigh muscle becomes weak.  You have an oral temperature above 102 F (38.9 C). SEEK IMMEDIATE MEDICAL CARE IF:  You have an oral temperature above 102 F (38.9 C), not controlled by medicine. Document Released: 09/29/2009 Document Revised: 01/03/2012 Document Reviewed: 12/31/2013 [employer] Patient Information 2015 [employer]. This information is not intended to replace advice given to you by your health care provider. Make sure you discuss any questions you have with your health care provider.  

## 2014-09-18 NOTE — Progress Notes (Signed)
Pre visit review using our clinic review tool, if applicable. No additional management support is needed unless otherwise documented below in the visit note. 

## 2014-09-18 NOTE — Progress Notes (Signed)
OFFICE NOTE  09/18/2014  CC:  Chief Complaint  Patient presents with  . Knee Pain    left knee    HPI: Patient is a 79 y.o. Caucasian female who is here for left knee pain.   Onset of pain 6 d/a, worsening daily, says she can hardly walk on it now.  She recalls walking and turned the knee a little after she lifted it and felt/heard a pop.  Question of increased fullness in area behind left knee since then--sounds like this was chronic but she is wondering if it looks worse recently. Says a few years ago she fell/tripped when walking and she had a nondisplaced fracture of inferior patella.  She saw Dr. Theda Sers.  Eventually it returned to normal.    Pertinent PMH:  Past medical, surgical, social, and family history reviewed and no changes are noted since last office visit.  MEDS:  Outpatient Prescriptions Prior to Visit  Medication Sig Dispense Refill  . carvedilol (COREG) 25 MG tablet TAKE ONE TABLET BY MOUTH TWICE DAILY WITH MEALS 60 tablet 5  . celecoxib (CELEBREX) 200 MG capsule Take 1 capsule (200 mg total) by mouth 2 (two) times daily as needed. For arthritis 180 capsule 3  . Cholecalciferol (VITAMIN D3) 1000 UNITS tablet Take 1,000 Units by mouth daily.      Marland Kitchen ibuprofen (ADVIL,MOTRIN) 800 MG tablet Take 1 tablet (800 mg total) by mouth 3 (three) times daily with meals. 60 tablet 5  . loratadine (CLARITIN) 10 MG tablet TAKE ONE TABLET BY MOUTH EVERY DAY AS NEEDED FOR ALLERGY 90 tablet 3  . tretinoin (RETIN-A) 0.1 % cream Apply topically at bedtime. 45 g 3  . triamcinolone cream (KENALOG) 0.5 % Apply topically 3 (three) times daily. 60 g 2  . aspirin (ASPIRIN CHILDRENS) 81 MG chewable tablet Chew 1 tablet (81 mg total) by mouth daily. (Patient not taking: Reported on 09/18/2014) 100 tablet 3  . spironolactone (ALDACTONE) 25 MG tablet Take 2 tablets (50 mg total) by mouth daily. 180 tablet 3  . benazepril (LOTENSIN) 20 MG tablet TAKE ONE TABLET BY MOUTH EVERY DAY (Patient not taking:  Reported on 09/18/2014) 90 tablet 3  . Ciprofloxacin-Ciproflox HCl 500 MG tablet Take 1 tablet (500 mg total) by mouth daily. 3 tablet 0   No facility-administered medications prior to visit.    PE: Blood pressure 117/60, pulse 60, temperature 97.9 F (36.6 C), temperature source Temporal, resp. rate 18, height 5\' 3"  (1.6 m), weight 143 lb (64.864 kg), SpO2 97 %. Gen: Alert, well appearing.  Patient is oriented to person, place, time, and situation. Left knee: no deformity, swelling, erythema, or warmth.  ROM fully intact.  Mild TTP in anteromedial knee/peripatellar region.  Mild increased patellar laxity both medially and laterally but no pain with this.  Negative patellar grind.  Patellar tendon nontender.  Mild pes anserine bursa tenderness but no swelling, warmth, or tenderness in this region.  No instability in collateral ligament regions.  No Ant/post knee instability. Popliteal fossa w/out fullness or tenderness.  IMPRESSION AND PLAN:  Left knee pain: suspect patellofemoral arthralgia plus likely a recent episode of brief patellar subluxation. X-ray knee today to eval for osteoarthritis. Knee sleeve fitted today for support/comfort.  Discussed medial quad strengthening exercises, relative rest, ice, elevation prn.  An After Visit Summary was printed and given to the patient.  FOLLOW UP: prn

## 2014-09-23 DIAGNOSIS — S86912A Strain of unspecified muscle(s) and tendon(s) at lower leg level, left leg, initial encounter: Secondary | ICD-10-CM | POA: Diagnosis not present

## 2014-09-25 ENCOUNTER — Ambulatory Visit: Payer: Medicare Other | Admitting: Family Medicine

## 2014-10-01 ENCOUNTER — Other Ambulatory Visit: Payer: Self-pay | Admitting: Internal Medicine

## 2014-11-06 ENCOUNTER — Ambulatory Visit (INDEPENDENT_AMBULATORY_CARE_PROVIDER_SITE_OTHER): Payer: Medicare Other | Admitting: Ophthalmology

## 2014-11-06 DIAGNOSIS — H34831 Tributary (branch) retinal vein occlusion, right eye: Secondary | ICD-10-CM

## 2014-11-06 DIAGNOSIS — H3531 Nonexudative age-related macular degeneration: Secondary | ICD-10-CM | POA: Diagnosis not present

## 2014-11-06 DIAGNOSIS — I10 Essential (primary) hypertension: Secondary | ICD-10-CM

## 2014-11-06 DIAGNOSIS — H35033 Hypertensive retinopathy, bilateral: Secondary | ICD-10-CM

## 2014-11-06 DIAGNOSIS — H43813 Vitreous degeneration, bilateral: Secondary | ICD-10-CM | POA: Diagnosis not present

## 2014-12-30 DIAGNOSIS — H01003 Unspecified blepharitis right eye, unspecified eyelid: Secondary | ICD-10-CM | POA: Diagnosis not present

## 2014-12-30 DIAGNOSIS — H00012 Hordeolum externum right lower eyelid: Secondary | ICD-10-CM | POA: Diagnosis not present

## 2014-12-30 DIAGNOSIS — H40013 Open angle with borderline findings, low risk, bilateral: Secondary | ICD-10-CM | POA: Diagnosis not present

## 2015-02-03 DIAGNOSIS — H3531 Nonexudative age-related macular degeneration: Secondary | ICD-10-CM | POA: Diagnosis not present

## 2015-02-03 DIAGNOSIS — H43811 Vitreous degeneration, right eye: Secondary | ICD-10-CM | POA: Diagnosis not present

## 2015-02-06 DIAGNOSIS — N390 Urinary tract infection, site not specified: Secondary | ICD-10-CM | POA: Diagnosis not present

## 2015-03-20 DIAGNOSIS — M1712 Unilateral primary osteoarthritis, left knee: Secondary | ICD-10-CM | POA: Diagnosis not present

## 2015-04-24 DIAGNOSIS — M1712 Unilateral primary osteoarthritis, left knee: Secondary | ICD-10-CM | POA: Diagnosis not present

## 2015-05-01 DIAGNOSIS — M1712 Unilateral primary osteoarthritis, left knee: Secondary | ICD-10-CM | POA: Diagnosis not present

## 2015-05-08 DIAGNOSIS — M1712 Unilateral primary osteoarthritis, left knee: Secondary | ICD-10-CM | POA: Diagnosis not present

## 2015-06-23 ENCOUNTER — Other Ambulatory Visit: Payer: Self-pay | Admitting: Internal Medicine

## 2015-08-11 ENCOUNTER — Ambulatory Visit (INDEPENDENT_AMBULATORY_CARE_PROVIDER_SITE_OTHER): Payer: Medicare Other | Admitting: Ophthalmology

## 2015-08-11 DIAGNOSIS — H43813 Vitreous degeneration, bilateral: Secondary | ICD-10-CM

## 2015-08-11 DIAGNOSIS — H35033 Hypertensive retinopathy, bilateral: Secondary | ICD-10-CM

## 2015-08-11 DIAGNOSIS — H34831 Tributary (branch) retinal vein occlusion, right eye, with macular edema: Secondary | ICD-10-CM

## 2015-08-11 DIAGNOSIS — I10 Essential (primary) hypertension: Secondary | ICD-10-CM

## 2015-08-11 DIAGNOSIS — J3489 Other specified disorders of nose and nasal sinuses: Secondary | ICD-10-CM | POA: Diagnosis not present

## 2015-08-11 DIAGNOSIS — H9113 Presbycusis, bilateral: Secondary | ICD-10-CM | POA: Diagnosis not present

## 2015-08-11 DIAGNOSIS — H35371 Puckering of macula, right eye: Secondary | ICD-10-CM | POA: Diagnosis not present

## 2015-08-11 DIAGNOSIS — H353121 Nonexudative age-related macular degeneration, left eye, early dry stage: Secondary | ICD-10-CM | POA: Diagnosis not present

## 2015-08-11 DIAGNOSIS — J342 Deviated nasal septum: Secondary | ICD-10-CM | POA: Diagnosis not present

## 2015-08-11 DIAGNOSIS — J310 Chronic rhinitis: Secondary | ICD-10-CM | POA: Diagnosis not present

## 2015-08-11 DIAGNOSIS — H9313 Tinnitus, bilateral: Secondary | ICD-10-CM | POA: Diagnosis not present

## 2015-08-11 DIAGNOSIS — J324 Chronic pansinusitis: Secondary | ICD-10-CM | POA: Diagnosis not present

## 2015-08-12 DIAGNOSIS — L820 Inflamed seborrheic keratosis: Secondary | ICD-10-CM | POA: Diagnosis not present

## 2015-08-12 DIAGNOSIS — L570 Actinic keratosis: Secondary | ICD-10-CM | POA: Diagnosis not present

## 2015-09-26 ENCOUNTER — Ambulatory Visit (INDEPENDENT_AMBULATORY_CARE_PROVIDER_SITE_OTHER): Payer: Medicare Other | Admitting: Family Medicine

## 2015-09-26 ENCOUNTER — Encounter: Payer: Self-pay | Admitting: Family Medicine

## 2015-09-26 VITALS — BP 136/75 | HR 53 | Temp 98.1°F | Resp 16 | Ht 63.0 in | Wt 142.0 lb

## 2015-09-26 DIAGNOSIS — N390 Urinary tract infection, site not specified: Secondary | ICD-10-CM | POA: Diagnosis not present

## 2015-09-26 DIAGNOSIS — R300 Dysuria: Secondary | ICD-10-CM | POA: Diagnosis not present

## 2015-09-26 LAB — POCT URINALYSIS DIPSTICK
Bilirubin, UA: NEGATIVE
Glucose, UA: NEGATIVE
Nitrite, UA: NEGATIVE
Spec Grav, UA: 1.025
Urobilinogen, UA: 0.2
pH, UA: 6

## 2015-09-26 MED ORDER — SULFAMETHOXAZOLE-TRIMETHOPRIM 800-160 MG PO TABS: 1.0000 | ORAL_TABLET | Freq: Two times a day (BID) | ORAL | Status: DC

## 2015-09-26 NOTE — Progress Notes (Signed)
OFFICE NOTE  09/26/2015  CC:  Chief Complaint  Patient presents with  . Urinary Tract Infection    x 3 days   HPI: Patient is a 80 y.o. Caucasian female who is here for pelvic pressure, urinary urgency/frequency, cloudy urine, some burning discomfort when she urinates.  No fevers.  +Malaise/fatigue.  No n/v. Cranberry juice x 2 days.  Pertinent PMH:  PMH and PSH reviewed.  MEDS:  Outpatient Prescriptions Prior to Visit  Medication Sig Dispense Refill  . carvedilol (COREG) 25 MG tablet TAKE ONE TABLET BY MOUTH TWICE DAILY WITH MEALS 60 tablet 5  . celecoxib (CELEBREX) 200 MG capsule Take 1 capsule (200 mg total) by mouth 2 (two) times daily as needed. For arthritis 180 capsule 3  . Cholecalciferol (VITAMIN D3) 1000 UNITS tablet Take 1,000 Units by mouth daily.      Marland Kitchen ibuprofen (ADVIL,MOTRIN) 800 MG tablet Take 1 tablet (800 mg total) by mouth 3 (three) times daily with meals. 60 tablet 5  . loratadine (CLARITIN) 10 MG tablet TAKE ONE TABLET BY MOUTH EVERY DAY AS NEEDED FOR ALLERGY 90 tablet 3  . tretinoin (RETIN-A) 0.1 % cream Apply topically at bedtime. 45 g 3  . spironolactone (ALDACTONE) 25 MG tablet Take 2 tablets (50 mg total) by mouth daily. 180 tablet 3  . aspirin (ASPIRIN CHILDRENS) 81 MG chewable tablet Chew 1 tablet (81 mg total) by mouth daily. (Patient not taking: Reported on 09/26/2015) 100 tablet 3  . gabapentin (NEURONTIN) 100 MG capsule      No facility-administered medications prior to visit.    PE: Blood pressure 136/75, pulse 53, temperature 98.1 F (36.7 C), temperature source Oral, resp. rate 16, height 5\' 3"  (1.6 m), weight 142 lb (64.411 kg), SpO2 97 %. Gen: Alert, well appearing.  Patient is oriented to person, place, time, and situation. AFFECT: pleasant, lucid thought and speech. No further exam today.  LAB: CC UA today showed large leuks, large blood, 2+ protein,  Otherwise normal.  IMPRESSION AND PLAN:  Acute cystitis. Send urine for c/s. Bactrim  DS 1 bid x 3-5d--rx handed to pt today per her request. An After Visit Summary was printed and given to the patient.   FOLLOW UP: prn

## 2015-09-26 NOTE — Progress Notes (Signed)
Pre visit review using our clinic review tool, if applicable. No additional management support is needed unless otherwise documented below in the visit note. 

## 2015-09-28 LAB — URINE CULTURE: Colony Count: >=100000

## 2015-09-29 ENCOUNTER — Ambulatory Visit: Payer: Medicare Other | Admitting: Internal Medicine

## 2015-10-09 ENCOUNTER — Other Ambulatory Visit (INDEPENDENT_AMBULATORY_CARE_PROVIDER_SITE_OTHER): Payer: Medicare Other

## 2015-10-09 ENCOUNTER — Encounter: Payer: Self-pay | Admitting: Internal Medicine

## 2015-10-09 ENCOUNTER — Ambulatory Visit (INDEPENDENT_AMBULATORY_CARE_PROVIDER_SITE_OTHER): Payer: Medicare Other | Admitting: Internal Medicine

## 2015-10-09 VITALS — BP 170/84 | HR 59 | Wt 143.0 lb

## 2015-10-09 DIAGNOSIS — G8929 Other chronic pain: Secondary | ICD-10-CM

## 2015-10-09 DIAGNOSIS — I10 Essential (primary) hypertension: Secondary | ICD-10-CM

## 2015-10-09 DIAGNOSIS — M5441 Lumbago with sciatica, right side: Secondary | ICD-10-CM | POA: Diagnosis not present

## 2015-10-09 DIAGNOSIS — R202 Paresthesia of skin: Secondary | ICD-10-CM

## 2015-10-09 DIAGNOSIS — G2581 Restless legs syndrome: Secondary | ICD-10-CM

## 2015-10-09 DIAGNOSIS — M79604 Pain in right leg: Secondary | ICD-10-CM | POA: Diagnosis not present

## 2015-10-09 LAB — URINALYSIS, ROUTINE W REFLEX MICROSCOPIC
Bilirubin Urine: NEGATIVE
Ketones, ur: NEGATIVE
Nitrite: NEGATIVE
Specific Gravity, Urine: 1.025 (ref 1.000–1.030)
Total Protein, Urine: NEGATIVE
Urine Glucose: NEGATIVE
Urobilinogen, UA: 0.2 (ref 0.0–1.0)
pH: 6 (ref 5.0–8.0)

## 2015-10-09 LAB — VITAMIN B12: Vitamin B-12: 923 pg/mL — ABNORMAL HIGH (ref 211–911)

## 2015-10-09 LAB — HEPATIC FUNCTION PANEL
ALT: 11 U/L (ref 0–35)
AST: 14 U/L (ref 0–37)
Albumin: 3.9 g/dL (ref 3.5–5.2)
Alkaline Phosphatase: 66 U/L (ref 39–117)
Bilirubin, Direct: 0.1 mg/dL (ref 0.0–0.3)
Total Bilirubin: 0.4 mg/dL (ref 0.2–1.2)
Total Protein: 6.8 g/dL (ref 6.0–8.3)

## 2015-10-09 LAB — CBC WITH DIFFERENTIAL/PLATELET
Basophils Absolute: 0.1 10*3/uL (ref 0.0–0.1)
Basophils Relative: 0.7 % (ref 0.0–3.0)
Eosinophils Absolute: 0.2 10*3/uL (ref 0.0–0.7)
Eosinophils Relative: 2.1 % (ref 0.0–5.0)
HCT: 42.7 % (ref 36.0–46.0)
Hemoglobin: 13.6 g/dL (ref 12.0–15.0)
Lymphocytes Relative: 25.7 % (ref 12.0–46.0)
Lymphs Abs: 2.2 10*3/uL (ref 0.7–4.0)
MCHC: 32 g/dL (ref 30.0–36.0)
MCV: 89.4 fl (ref 78.0–100.0)
Monocytes Absolute: 0.6 10*3/uL (ref 0.1–1.0)
Monocytes Relative: 6.8 % (ref 3.0–12.0)
Neutro Abs: 5.4 10*3/uL (ref 1.4–7.7)
Neutrophils Relative %: 64.7 % (ref 43.0–77.0)
Platelets: 291 10*3/uL (ref 150.0–400.0)
RBC: 4.77 Mil/uL (ref 3.87–5.11)
RDW: 14.6 % (ref 11.5–15.5)
WBC: 8.4 10*3/uL (ref 4.0–10.5)

## 2015-10-09 LAB — TSH: TSH: 1.97 u[IU]/mL (ref 0.35–4.50)

## 2015-10-09 LAB — BASIC METABOLIC PANEL
BUN: 24 mg/dL — ABNORMAL HIGH (ref 6–23)
CO2: 33 mEq/L — ABNORMAL HIGH (ref 19–32)
Calcium: 8.8 mg/dL (ref 8.4–10.5)
Chloride: 107 mEq/L (ref 96–112)
Creatinine, Ser: 0.59 mg/dL (ref 0.40–1.20)
GFR: 104.61 mL/min (ref >60.00)
Glucose, Bld: 88 mg/dL (ref 70–99)
Potassium: 4.2 mEq/L (ref 3.5–5.1)
Sodium: 143 mEq/L (ref 135–145)

## 2015-10-09 LAB — SEDIMENTATION RATE: Sed Rate: 9 mm/hr (ref 0–22)

## 2015-10-09 MED ORDER — CARVEDILOL 25 MG PO TABS: 25.0000 mg | ORAL_TABLET | Freq: Two times a day (BID) | ORAL | Status: DC

## 2015-10-09 MED ORDER — METHYLPREDNISOLONE ACETATE 80 MG/ML IJ SUSP
80.0000 mg | Freq: Once | INTRAMUSCULAR | Status: AC
  Administered 2015-10-09: 80 mg via INTRAMUSCULAR

## 2015-10-09 MED ORDER — TRETINOIN 0.1 % EX CREA: TOPICAL_CREAM | Freq: Every day | CUTANEOUS | Status: DC

## 2015-10-09 MED ORDER — GABAPENTIN 100 MG PO CAPS: 100.0000 mg | ORAL_CAPSULE | Freq: Three times a day (TID) | ORAL | Status: DC

## 2015-10-09 MED ORDER — LORATADINE 10 MG PO TABS
ORAL_TABLET | ORAL | Status: DC
Start: 2015-10-09 — End: 2016-09-22

## 2015-10-09 MED ORDER — IBUPROFEN 800 MG PO TABS: 800.0000 mg | ORAL_TABLET | Freq: Three times a day (TID) | ORAL | Status: DC

## 2015-10-09 MED ORDER — POTASSIUM CHLORIDE ER 10 MEQ PO TBCR: 10.0000 meq | EXTENDED_RELEASE_TABLET | Freq: Every day | ORAL | Status: DC

## 2015-10-09 NOTE — Assessment & Plan Note (Addendum)
Worse in 12.16 - irrad to R leg MRI LS 2007: IMPRESSION:  Degenerative lumbar spondylosis with associated disc disease and degenerative facet disease. There is multilevel, multifactorial spinal and lateral recess stenosis. This is most significant at L3-4 where there is also left foraminal stenosis.

## 2015-10-09 NOTE — Assessment & Plan Note (Signed)
Chronic  12/16 start Gabapentin Labs

## 2015-10-09 NOTE — Assessment & Plan Note (Signed)
Chronic periph neuropathy of ?etiology Labs Gabapentin

## 2015-10-09 NOTE — Assessment & Plan Note (Signed)
Labs

## 2015-10-09 NOTE — Progress Notes (Signed)
Pre visit review using our clinic review tool, if applicable. No additional management support is needed unless otherwise documented below in the visit note. 

## 2015-10-10 ENCOUNTER — Ambulatory Visit: Payer: Medicare Other | Admitting: Internal Medicine

## 2015-10-12 NOTE — Progress Notes (Signed)
Subjective:  Patient ID: Erin [name], female    DOB: 1934-12-27  Age: 80 y.o. MRN: [id number]  CC: No chief complaint on file.   HPI VONYA ADDUCI presents for LBP, numbness in the feet, RLS, HTN f/u  Outpatient Prescriptions Prior to Visit  Medication Sig Dispense Refill  . celecoxib (CELEBREX) 200 MG capsule Take 1 capsule (200 mg total) by mouth 2 (two) times daily as needed. For arthritis 180 capsule 3  . Cholecalciferol (VITAMIN D3) 1000 UNITS tablet Take 1,000 Units by mouth daily.      . carvedilol (COREG) 25 MG tablet TAKE ONE TABLET BY MOUTH TWICE DAILY WITH MEALS 60 tablet 5  . ibuprofen (ADVIL,MOTRIN) 800 MG tablet Take 1 tablet (800 mg total) by mouth 3 (three) times daily with meals. 60 tablet 5  . loratadine (CLARITIN) 10 MG tablet TAKE ONE TABLET BY MOUTH EVERY DAY AS NEEDED FOR ALLERGY 90 tablet 3  . sulfamethoxazole-trimethoprim (BACTRIM DS,SEPTRA DS) 800-160 MG tablet Take 1 tablet by mouth 2 (two) times daily. 10 tablet 0  . tretinoin (RETIN-A) 0.1 % cream Apply topically at bedtime. 45 g 3  . spironolactone (ALDACTONE) 25 MG tablet Take 2 tablets (50 mg total) by mouth daily. 180 tablet 3   No facility-administered medications prior to visit.    ROS Review of Systems  Constitutional: Negative for chills, activity change, appetite change, fatigue and unexpected weight change.  HENT: Negative for congestion, mouth sores and sinus pressure.   Eyes: Negative for visual disturbance.  Respiratory: Negative for cough and chest tightness.   Gastrointestinal: Negative for nausea and abdominal pain.  Genitourinary: Negative for frequency, difficulty urinating and vaginal pain.  Musculoskeletal: Positive for back pain. Negative for gait problem.  Skin: Negative for pallor and rash.  Neurological: Positive for numbness. Negative for dizziness, tremors, weakness and headaches.  Psychiatric/Behavioral: Negative for confusion and sleep disturbance.    Objective:  BP  170/84 mmHg  Pulse 59  Wt 143 lb (64.864 kg)  SpO2 99%  BP Readings from Last 3 Encounters:  10/09/15 170/84  09/26/15 136/75  09/18/14 117/60    Wt Readings from Last 3 Encounters:  10/09/15 143 lb (64.864 kg)  09/26/15 142 lb (64.411 kg)  09/18/14 143 lb (64.864 kg)    Physical Exam  Constitutional: She appears well-developed. No distress.  HENT:  Head: Normocephalic.  Right Ear: External ear normal.  Left Ear: External ear normal.  Nose: Nose normal.  Mouth/Throat: Oropharynx is clear and moist.  Eyes: Conjunctivae are normal. Pupils are equal, round, and reactive to light. Right eye exhibits no discharge. Left eye exhibits no discharge.  Neck: Normal range of motion. Neck supple. No JVD present. No tracheal deviation present. No thyromegaly present.  Cardiovascular: Normal rate, regular rhythm and normal heart sounds.   Pulmonary/Chest: No stridor. No respiratory distress. She has no wheezes.  Abdominal: Soft. Bowel sounds are normal. She exhibits no distension and no mass. There is no tenderness. There is no rebound and no guarding.  Musculoskeletal: She exhibits tenderness. She exhibits no edema.  Lymphadenopathy:    She has no cervical adenopathy.  Neurological: She displays normal reflexes. No cranial nerve deficit. She exhibits normal muscle tone. Coordination normal.  Skin: No rash noted. No erythema.  Psychiatric: She has a normal mood and affect. Her behavior is normal. Judgment and thought content normal.  LS is tender  Lab Results  Component Value Date   WBC 8.4 10/09/2015   HGB 13.6 10/09/2015  HCT 42.7 10/09/2015   PLT 291.0 10/09/2015   GLUCOSE 88 10/09/2015   CHOL 149 09/19/2012   TRIG 18.0 09/19/2012   HDL 91.40 09/19/2012   LDLCALC 54 09/19/2012   ALT 11 10/09/2015   AST 14 10/09/2015   NA 143 10/09/2015   K 4.2 10/09/2015   CL 107 10/09/2015   CREATININE 0.59 10/09/2015   BUN 24* 10/09/2015   CO2 33* 10/09/2015   TSH 1.97 10/09/2015     Dg Chest 2 View  11/30/2013  CLINICAL DATA:  One week history of cough and chest congestion. EXAM: CHEST  2 VIEW COMPARISON:  None. FINDINGS: Cardiac silhouette mildly enlarged. Thoracic aorta tortuous and mildly atherosclerotic. Hilar and mediastinal contours otherwise unremarkable. Emphysematous changes suspected in the upper lobes, left greater than right. Prominent bronchovascular markings diffusely and mild central peribronchial thickening. Lungs otherwise clear. No localized airspace consolidation. No pleural effusions. No pneumothorax. Normal pulmonary vascularity. Mild degenerative changes involving the thoracic spine. IMPRESSION: 1. Moderate changes of bronchitis and/or asthma which may be acute or chronic. No localized airspace pneumonia. 2. COPD/emphysema suspected. 3. Mild cardiomegaly without pulmonary edema. Electronically Signed   By: Evangeline Dakin M.D.   On: 11/30/2013 13:18    Assessment & Plan:   Diagnoses and all orders for this visit:  Chronic right-sided low back pain with right-sided sciatica -     Vitamin B12; Future -     TSH; Future -     Urinalysis; Future -     Basic metabolic panel; Future -     CBC with Differential/Platelet; Future -     Hepatic function panel; Future -     Sedimentation rate; Future -     Ambulatory referral to Neurosurgery -     methylPREDNISolone acetate (DEPO-MEDROL) injection 80 mg; Inject 1 mL (80 mg total) into the muscle once.  Pain of right lower extremity -     Vitamin B12; Future -     TSH; Future -     Urinalysis; Future -     Basic metabolic panel; Future -     CBC with Differential/Platelet; Future -     Hepatic function panel; Future -     Sedimentation rate; Future -     Ambulatory referral to Neurosurgery  Paresthesia of both feet -     Vitamin B12; Future -     TSH; Future -     Urinalysis; Future -     Basic metabolic panel; Future -     CBC with Differential/Platelet; Future -     Hepatic function panel;  Future -     Sedimentation rate; Future  Essential hypertension -     Vitamin B12; Future -     TSH; Future -     Urinalysis; Future -     Basic metabolic panel; Future -     CBC with Differential/Platelet; Future -     Hepatic function panel; Future -     Sedimentation rate; Future  RLS (restless legs syndrome) -     Vitamin B12; Future -     TSH; Future -     Urinalysis; Future -     Basic metabolic panel; Future -     CBC with Differential/Platelet; Future -     Hepatic function panel; Future -     Sedimentation rate; Future  Other orders -     carvedilol (COREG) 25 MG tablet; Take 1 tablet (25 mg total) by mouth 2 (two) times  daily with a meal. -     ibuprofen (ADVIL,MOTRIN) 800 MG tablet; Take 1 tablet (800 mg total) by mouth 3 (three) times daily with meals. -     loratadine (CLARITIN) 10 MG tablet; TAKE ONE TABLET BY MOUTH EVERY DAY AS NEEDED FOR ALLERGY -     tretinoin (RETIN-A) 0.1 % cream; Apply topically at bedtime. -     potassium chloride (KLOR-CON 10) 10 MEQ tablet; Take 1 tablet (10 mEq total) by mouth daily. With Lasix -     gabapentin (NEURONTIN) 100 MG capsule; Take 1 capsule (100 mg total) by mouth 3 (three) times daily. For back and legs symptoms   I have discontinued Ms. Cogdill sulfamethoxazole-trimethoprim. I have also changed her carvedilol. Additionally, I am having her start on potassium chloride and gabapentin. Lastly, I am having her maintain her cholecalciferol, spironolactone, celecoxib, ibuprofen, loratadine, and tretinoin. We administered methylPREDNISolone acetate.  Meds ordered this encounter  Medications  . carvedilol (COREG) 25 MG tablet    Sig: Take 1 tablet (25 mg total) by mouth 2 (two) times daily with a meal.    Dispense:  180 tablet    Refill:  3  . ibuprofen (ADVIL,MOTRIN) 800 MG tablet    Sig: Take 1 tablet (800 mg total) by mouth 3 (three) times daily with meals.    Dispense:  60 tablet    Refill:  5  . loratadine (CLARITIN) 10  MG tablet    Sig: TAKE ONE TABLET BY MOUTH EVERY DAY AS NEEDED FOR ALLERGY    Dispense:  90 tablet    Refill:  3  . tretinoin (RETIN-A) 0.1 % cream    Sig: Apply topically at bedtime.    Dispense:  45 g    Refill:  3    Brand name please  . potassium chloride (KLOR-CON 10) 10 MEQ tablet    Sig: Take 1 tablet (10 mEq total) by mouth daily. With Lasix    Dispense:  90 tablet    Refill:  3  . gabapentin (NEURONTIN) 100 MG capsule    Sig: Take 1 capsule (100 mg total) by mouth 3 (three) times daily. For back and legs symptoms    Dispense:  90 capsule    Refill:  3  . methylPREDNISolone acetate (DEPO-MEDROL) injection 80 mg    Sig:      Follow-up: Return in about 3 months (around 01/07/2016) for Wellness Exam.  Walker Kehr, MD

## 2015-10-13 ENCOUNTER — Ambulatory Visit (INDEPENDENT_AMBULATORY_CARE_PROVIDER_SITE_OTHER): Payer: Medicare Other | Admitting: Family Medicine

## 2015-10-13 ENCOUNTER — Encounter: Payer: Self-pay | Admitting: Family Medicine

## 2015-10-13 VITALS — BP 173/78 | HR 57 | Temp 98.0°F | Resp 16 | Ht 63.0 in | Wt 143.0 lb

## 2015-10-13 DIAGNOSIS — L089 Local infection of the skin and subcutaneous tissue, unspecified: Secondary | ICD-10-CM

## 2015-10-13 DIAGNOSIS — I10 Essential (primary) hypertension: Secondary | ICD-10-CM | POA: Diagnosis not present

## 2015-10-13 MED ORDER — AMOXICILLIN-POT CLAVULANATE 875-125 MG PO TABS: 1.0000 | ORAL_TABLET | Freq: Two times a day (BID) | ORAL | Status: DC

## 2015-10-13 NOTE — Patient Instructions (Addendum)
I have prescribed Augmentin for you to tae two days for 10 days for your thumb infection.  I want to see your PCP if it becomes more painful, swollen or red. It should improve over the next day. I want you to pick up bag balm for the cracks in your fingers and use on your hands nightly.  For your dry skin use cetaphil after shower, do not rub your towel, pat dry after shower and put on cream. Check your BP 2 hours after you morning medications. If it is consistently above 140/90, you need to make an appt with your PCP to discuss your medications. Eat a low salt diet.

## 2015-10-13 NOTE — Progress Notes (Signed)
Pre visit review using our clinic review tool, if applicable. No additional management support is needed unless otherwise documented below in the visit note. 

## 2015-10-13 NOTE — Progress Notes (Signed)
Subjective:    Patient ID: Erin [name], female    DOB: 10/11/1935, 80 y.o.   MRN: [id number]  HPI  Right thumb pain: Patient presents for an acute office visit today for right thumb discomfort. Patient states she woke up this morning and noticed her right thumb was mildly swollen and red. She states it's mildly uncomfortable. She is able to move the thumb without discomfort. She does have a history of arthritis. She states she has multiple small cuts on her fingers from dry skin. She has not been treating these with anything. She reports having her hands in water and cleaning agents frequently. She denies any fevers or chills. She denies drainage from the fissures.   Elevated BP: patient presents for an acute office visit with an elevated blood pressure today. Patient reports compliance with her carvedilol twice a day. She reports she does take her blood pressures at home and she has noticed them more elevated in the [id number] systolic. She was seen at her PCPs office last week with the mildly elevated blood pressure at that time as well. Patient believes that her blood pressure is elevated secondary to pain she currently has some low back pain that is being evaluated by her PCP.  Patient denies any chest pain, shortness of breath, dizziness, headaches or lower extremity edema.    former smoker   Past Medical History  Diagnosis Date  . Hypertension   . Low back pain   . OA (osteoarthritis)   . Osteopenia    Allergies  Allergen Reactions  . Barbiturates   . Demerol Other (See Comments)    sick  . Furosemide   . Hydrochlorothiazide   . Irbesartan     REACTION: HA  . Losartan Potassium     REACTION: hair loss  . Olmesartan Medoxomil     REACTION: hair loss  . Telmisartan     REACTION: cramps    Review of Systems Negative, with the exception of above mentioned in HPI     Objective:   Physical Exam BP 194/79 mmHg  Pulse 57  Temp(Src) 98 F (36.7 C) (Temporal)  Resp 16   Ht 5\' 3"  (1.6 m)  Wt 143 lb (64.864 kg)  BMI 25.34 kg/m2  SpO2 99% Gen: Afebrile. No acute distress. Nontoxic in appearance, well-developed, well-nourished, Caucasian female. Very pleasant. Talkative. HENT: AT. [place]. MMM.  Eyes:Pupils Equal Round Reactive to light, Extraocular movements intact,  Conjunctiva without redness, discharge or icterus. MSK: Very mild erythema, mild soft tissue swelling right distal thumb. Very mild tenderness to palpation, full range of motion, neurovascularly intact distally. Skin: Multiple fissures of distal phalanx bilaterally. No drainage. Neuro: Normal gait. PERLA. EOMi. Alert. Oriented x3      Assessment & Plan:  1. Skin infection - pt with multiple fissures of her distal fingers. Right thumb appears mildly infected. No drainage, mild swelling, mild redness. Will treat with Augmentin. Patient encouraged follow-up with PCP if worsening. - discussed treatment with keeping fingers away from water and cleaning agents, wearing gloves at all times. Keeping gloves when going into the cold. - Discussed using bag-balm a few times a week, covering with a glove. Discussed Cetaphil use ever dry skin over arms and hands after showers. - amoxicillin-clavulanate (AUGMENTIN) 875-125 MG tablet; Take 1 tablet by mouth 2 (two) times daily.  Dispense: 20 tablet; Refill: 0  2. Essential hypertension - Patient with elevated blood pressure above goal today, blood pressure in office earlier in the week was  also above goal. Patient reports compliance with her carvedilol daily. She was encouraged to monitor her blood pressure 2 hours after taking her morning dose of medication and recording these. If consistently above 140/90 she is to call her PCP to schedule appointment to discuss blood pressure medications. Encouraged low-salt diet.  Follow-up as needed

## 2015-10-28 DIAGNOSIS — M1712 Unilateral primary osteoarthritis, left knee: Secondary | ICD-10-CM | POA: Diagnosis not present

## 2015-11-04 DIAGNOSIS — J45909 Unspecified asthma, uncomplicated: Secondary | ICD-10-CM | POA: Diagnosis not present

## 2015-11-04 DIAGNOSIS — J209 Acute bronchitis, unspecified: Secondary | ICD-10-CM | POA: Diagnosis not present

## 2015-11-04 DIAGNOSIS — B349 Viral infection, unspecified: Secondary | ICD-10-CM | POA: Diagnosis not present

## 2015-11-11 DIAGNOSIS — J209 Acute bronchitis, unspecified: Secondary | ICD-10-CM | POA: Diagnosis not present

## 2015-11-11 DIAGNOSIS — J45909 Unspecified asthma, uncomplicated: Secondary | ICD-10-CM | POA: Diagnosis not present

## 2015-11-11 DIAGNOSIS — R001 Bradycardia, unspecified: Secondary | ICD-10-CM | POA: Diagnosis not present

## 2015-11-11 DIAGNOSIS — J029 Acute pharyngitis, unspecified: Secondary | ICD-10-CM | POA: Diagnosis not present

## 2015-11-18 DIAGNOSIS — Z888 Allergy status to other drugs, medicaments and biological substances status: Secondary | ICD-10-CM | POA: Diagnosis not present

## 2015-11-18 DIAGNOSIS — Z79899 Other long term (current) drug therapy: Secondary | ICD-10-CM | POA: Diagnosis not present

## 2015-11-18 DIAGNOSIS — I10 Essential (primary) hypertension: Secondary | ICD-10-CM | POA: Diagnosis not present

## 2015-11-18 DIAGNOSIS — R040 Epistaxis: Secondary | ICD-10-CM | POA: Diagnosis not present

## 2015-11-18 DIAGNOSIS — Z885 Allergy status to narcotic agent status: Secondary | ICD-10-CM | POA: Diagnosis not present

## 2015-12-23 DIAGNOSIS — J019 Acute sinusitis, unspecified: Secondary | ICD-10-CM | POA: Diagnosis not present

## 2015-12-23 DIAGNOSIS — H9313 Tinnitus, bilateral: Secondary | ICD-10-CM | POA: Diagnosis not present

## 2015-12-23 DIAGNOSIS — R040 Epistaxis: Secondary | ICD-10-CM | POA: Diagnosis not present

## 2015-12-23 DIAGNOSIS — H903 Sensorineural hearing loss, bilateral: Secondary | ICD-10-CM | POA: Diagnosis not present

## 2016-01-07 ENCOUNTER — Encounter: Payer: Medicare Other | Admitting: Internal Medicine

## 2016-02-18 DIAGNOSIS — M1712 Unilateral primary osteoarthritis, left knee: Secondary | ICD-10-CM | POA: Diagnosis not present

## 2016-02-26 DIAGNOSIS — M1712 Unilateral primary osteoarthritis, left knee: Secondary | ICD-10-CM | POA: Diagnosis not present

## 2016-03-04 DIAGNOSIS — M1712 Unilateral primary osteoarthritis, left knee: Secondary | ICD-10-CM | POA: Diagnosis not present

## 2016-05-10 ENCOUNTER — Ambulatory Visit (INDEPENDENT_AMBULATORY_CARE_PROVIDER_SITE_OTHER): Payer: Medicare Other | Admitting: Ophthalmology

## 2016-05-10 DIAGNOSIS — H35371 Puckering of macula, right eye: Secondary | ICD-10-CM

## 2016-05-10 DIAGNOSIS — I10 Essential (primary) hypertension: Secondary | ICD-10-CM

## 2016-05-10 DIAGNOSIS — H43813 Vitreous degeneration, bilateral: Secondary | ICD-10-CM | POA: Diagnosis not present

## 2016-05-10 DIAGNOSIS — H35033 Hypertensive retinopathy, bilateral: Secondary | ICD-10-CM

## 2016-05-10 DIAGNOSIS — H353121 Nonexudative age-related macular degeneration, left eye, early dry stage: Secondary | ICD-10-CM

## 2016-05-10 DIAGNOSIS — H348312 Tributary (branch) retinal vein occlusion, right eye, stable: Secondary | ICD-10-CM

## 2016-05-24 DIAGNOSIS — Z961 Presence of intraocular lens: Secondary | ICD-10-CM | POA: Diagnosis not present

## 2016-05-24 DIAGNOSIS — H40013 Open angle with borderline findings, low risk, bilateral: Secondary | ICD-10-CM | POA: Diagnosis not present

## 2016-05-24 DIAGNOSIS — H35031 Hypertensive retinopathy, right eye: Secondary | ICD-10-CM | POA: Diagnosis not present

## 2016-05-24 DIAGNOSIS — H353122 Nonexudative age-related macular degeneration, left eye, intermediate dry stage: Secondary | ICD-10-CM | POA: Diagnosis not present

## 2016-05-24 DIAGNOSIS — H35032 Hypertensive retinopathy, left eye: Secondary | ICD-10-CM | POA: Diagnosis not present

## 2016-05-24 DIAGNOSIS — H3561 Retinal hemorrhage, right eye: Secondary | ICD-10-CM | POA: Diagnosis not present

## 2016-05-24 DIAGNOSIS — H348312 Tributary (branch) retinal vein occlusion, right eye, stable: Secondary | ICD-10-CM | POA: Diagnosis not present

## 2016-09-09 ENCOUNTER — Ambulatory Visit: Payer: Medicare Other | Admitting: Internal Medicine

## 2016-09-22 ENCOUNTER — Encounter: Payer: Self-pay | Admitting: Internal Medicine

## 2016-09-22 ENCOUNTER — Ambulatory Visit (INDEPENDENT_AMBULATORY_CARE_PROVIDER_SITE_OTHER): Payer: Medicare Other | Admitting: Internal Medicine

## 2016-09-22 ENCOUNTER — Other Ambulatory Visit (INDEPENDENT_AMBULATORY_CARE_PROVIDER_SITE_OTHER): Payer: Medicare Other

## 2016-09-22 DIAGNOSIS — I10 Essential (primary) hypertension: Secondary | ICD-10-CM | POA: Diagnosis not present

## 2016-09-22 DIAGNOSIS — R5383 Other fatigue: Secondary | ICD-10-CM

## 2016-09-22 DIAGNOSIS — M7662 Achilles tendinitis, left leg: Secondary | ICD-10-CM

## 2016-09-22 DIAGNOSIS — E538 Deficiency of other specified B group vitamins: Secondary | ICD-10-CM | POA: Diagnosis not present

## 2016-09-22 DIAGNOSIS — M7660 Achilles tendinitis, unspecified leg: Secondary | ICD-10-CM | POA: Insufficient documentation

## 2016-09-22 LAB — BASIC METABOLIC PANEL
BUN: 28 mg/dL — ABNORMAL HIGH (ref 6–23)
CO2: 33 mEq/L — ABNORMAL HIGH (ref 19–32)
Calcium: 9.6 mg/dL (ref 8.4–10.5)
Chloride: 103 mEq/L (ref 96–112)
Creatinine, Ser: 0.81 mg/dL (ref 0.40–1.20)
GFR: 72.39 mL/min (ref >60.00)
Glucose, Bld: 95 mg/dL (ref 70–99)
Potassium: 4 mEq/L (ref 3.5–5.1)
Sodium: 144 mEq/L (ref 135–145)

## 2016-09-22 LAB — HEPATIC FUNCTION PANEL
ALT: 15 U/L (ref 0–35)
AST: 19 U/L (ref 0–37)
Albumin: 4.3 g/dL (ref 3.5–5.2)
Alkaline Phosphatase: 78 U/L (ref 39–117)
Bilirubin, Direct: 0.1 mg/dL (ref 0.0–0.3)
Total Bilirubin: 0.4 mg/dL (ref 0.2–1.2)
Total Protein: 7.1 g/dL (ref 6.0–8.3)

## 2016-09-22 LAB — SEDIMENTATION RATE: Sed Rate: 7 mm/hr (ref 0–30)

## 2016-09-22 LAB — CBC WITH DIFFERENTIAL/PLATELET
Basophils Absolute: 0 10*3/uL (ref 0.0–0.1)
Basophils Relative: 0.4 % (ref 0.0–3.0)
Eosinophils Absolute: 0.2 10*3/uL (ref 0.0–0.7)
Eosinophils Relative: 3.1 % (ref 0.0–5.0)
HCT: 41.3 % (ref 36.0–46.0)
Hemoglobin: 13.7 g/dL (ref 12.0–15.0)
Lymphocytes Relative: 33.5 % (ref 12.0–46.0)
Lymphs Abs: 2.5 10*3/uL (ref 0.7–4.0)
MCHC: 33.2 g/dL (ref 30.0–36.0)
MCV: 87.5 fl (ref 78.0–100.0)
Monocytes Absolute: 0.9 10*3/uL (ref 0.1–1.0)
Monocytes Relative: 11.9 % (ref 3.0–12.0)
Neutro Abs: 3.8 10*3/uL (ref 1.4–7.7)
Neutrophils Relative %: 51.1 % (ref 43.0–77.0)
Platelets: 265 10*3/uL (ref 150.0–400.0)
RBC: 4.73 Mil/uL (ref 3.87–5.11)
RDW: 14.8 % (ref 11.5–15.5)
WBC: 7.4 10*3/uL (ref 4.0–10.5)

## 2016-09-22 LAB — URIC ACID: Uric Acid, Serum: 4.8 mg/dL (ref 2.4–7.0)

## 2016-09-22 LAB — TSH: TSH: 2.49 u[IU]/mL (ref 0.35–4.50)

## 2016-09-22 MED ORDER — IBUPROFEN 800 MG PO TABS: 800.0000 mg | ORAL_TABLET | Freq: Three times a day (TID) | ORAL | 5 refills | Status: DC

## 2016-09-22 MED ORDER — MELOXICAM 15 MG PO TABS: 15.0000 mg | ORAL_TABLET | Freq: Every day | ORAL | 0 refills | Status: DC

## 2016-09-22 MED ORDER — CARVEDILOL 25 MG PO TABS: 25.0000 mg | ORAL_TABLET | Freq: Two times a day (BID) | ORAL | 3 refills | Status: DC

## 2016-09-22 MED ORDER — CYANOCOBALAMIN 1000 MCG/ML IJ SOLN
1000.0000 ug | Freq: Once | INTRAMUSCULAR | Status: AC
  Administered 2016-09-22: 1000 ug via INTRAMUSCULAR

## 2016-09-22 MED ORDER — TRETINOIN 0.1 % EX CREA: TOPICAL_CREAM | Freq: Every day | CUTANEOUS | 3 refills | Status: DC

## 2016-09-22 MED ORDER — LORATADINE 10 MG PO TABS: ORAL_TABLET | ORAL | 3 refills | Status: DC

## 2016-09-22 MED ORDER — POTASSIUM CHLORIDE ER 10 MEQ PO TBCR: 10.0000 meq | EXTENDED_RELEASE_TABLET | Freq: Every day | ORAL | 3 refills | Status: DC

## 2016-09-22 NOTE — Progress Notes (Signed)
Subjective:  Patient ID: Erin [name], female    DOB: 12-01-1934  Age: 81 y.o. MRN: [id number]  CC: No chief complaint on file.   HPI Erin [name] presents for L heel pain severe pain x 3 weeks - worse w/walking. No injury. F/u HTN, gout, knee OA  Outpatient Medications Prior to Visit  Medication Sig Dispense Refill  . carvedilol (COREG) 25 MG tablet Take 1 tablet (25 mg total) by mouth 2 (two) times daily with a meal. 180 tablet 3  . celecoxib (CELEBREX) 200 MG capsule Take 1 capsule (200 mg total) by mouth 2 (two) times daily as needed. For arthritis 180 capsule 3  . Cholecalciferol (VITAMIN D3) 1000 UNITS tablet Take 1,000 Units by mouth daily.      Marland Kitchen gabapentin (NEURONTIN) 100 MG capsule Take 1 capsule (100 mg total) by mouth 3 (three) times daily. For back and legs symptoms 90 capsule 3  . ibuprofen (ADVIL,MOTRIN) 800 MG tablet Take 1 tablet (800 mg total) by mouth 3 (three) times daily with meals. 60 tablet 5  . loratadine (CLARITIN) 10 MG tablet TAKE ONE TABLET BY MOUTH EVERY DAY AS NEEDED FOR ALLERGY 90 tablet 3  . potassium chloride (KLOR-CON 10) 10 MEQ tablet Take 1 tablet (10 mEq total) by mouth daily. With Lasix 90 tablet 3  . tretinoin (RETIN-A) 0.1 % cream Apply topically at bedtime. 45 g 3  . amoxicillin-clavulanate (AUGMENTIN) 875-125 MG tablet Take 1 tablet by mouth 2 (two) times daily. 20 tablet 0  . spironolactone (ALDACTONE) 25 MG tablet Take 2 tablets (50 mg total) by mouth daily. 180 tablet 3   No facility-administered medications prior to visit.     ROS Review of Systems  Constitutional: Negative for activity change, appetite change, chills, fatigue and unexpected weight change.  HENT: Negative for congestion, mouth sores and sinus pressure.   Eyes: Negative for visual disturbance.  Respiratory: Negative for cough and chest tightness.   Gastrointestinal: Negative for abdominal pain and nausea.  Genitourinary: Negative for difficulty urinating, frequency  and vaginal pain.  Musculoskeletal: Positive for arthralgias and gait problem. Negative for back pain.  Skin: Negative for pallor and rash.  Neurological: Negative for dizziness, tremors, weakness, numbness and headaches.  Psychiatric/Behavioral: Negative for confusion and sleep disturbance.  knee pain  Objective:  BP (!) 190/80   Pulse (!) 58   Wt 142 lb (64.4 kg)   SpO2 99%   BMI 25.15 kg/m   BP Readings from Last 3 Encounters:  09/22/16 (!) 190/80  10/13/15 (!) 173/78  10/09/15 (!) 170/84    Wt Readings from Last 3 Encounters:  09/22/16 142 lb (64.4 kg)  10/13/15 143 lb (64.9 kg)  10/09/15 143 lb (64.9 kg)    Physical Exam  Constitutional: She appears well-developed. No distress.  HENT:  Head: Normocephalic.  Right Ear: External ear normal.  Left Ear: External ear normal.  Nose: Nose normal.  Mouth/Throat: Oropharynx is clear and moist.  Eyes: Conjunctivae are normal. Pupils are equal, round, and reactive to light. Right eye exhibits no discharge. Left eye exhibits no discharge.  Neck: Normal range of motion. Neck supple. No JVD present. No tracheal deviation present. No thyromegaly present.  Cardiovascular: Normal rate, regular rhythm and normal heart sounds.   Pulmonary/Chest: No stridor. No respiratory distress. She has no wheezes.  Abdominal: Soft. Bowel sounds are normal. She exhibits no distension and no mass. There is no tenderness. There is no rebound and no guarding.  Musculoskeletal: She  exhibits tenderness. She exhibits no edema.  Lymphadenopathy:    She has no cervical adenopathy.  Neurological: She displays normal reflexes. No cranial nerve deficit. She exhibits normal muscle tone. Coordination normal.  Skin: No rash noted. No erythema.  Psychiatric: She has a normal mood and affect. Her behavior is normal. Judgment and thought content normal.  L achilles tendon is very tender L knee is tender  Lab Results  Component Value Date   WBC 8.4 10/09/2015     HGB 13.6 10/09/2015   HCT 42.7 10/09/2015   PLT 291.0 10/09/2015   GLUCOSE 88 10/09/2015   CHOL 149 09/19/2012   TRIG 18.0 09/19/2012   HDL 91.40 09/19/2012   LDLCALC 54 09/19/2012   ALT 11 10/09/2015   AST 14 10/09/2015   NA 143 10/09/2015   K 4.2 10/09/2015   CL 107 10/09/2015   CREATININE 0.59 10/09/2015   BUN 24 (H) 10/09/2015   CO2 33 (H) 10/09/2015   TSH 1.97 10/09/2015    Dg Chest 2 View  Result Date: 11/30/2013 CLINICAL DATA:  One week history of cough and chest congestion. EXAM: CHEST  2 VIEW COMPARISON:  None. FINDINGS: Cardiac silhouette mildly enlarged. Thoracic aorta tortuous and mildly atherosclerotic. Hilar and mediastinal contours otherwise unremarkable. Emphysematous changes suspected in the upper lobes, left greater than right. Prominent bronchovascular markings diffusely and mild central peribronchial thickening. Lungs otherwise clear. No localized airspace consolidation. No pleural effusions. No pneumothorax. Normal pulmonary vascularity. Mild degenerative changes involving the thoracic spine. IMPRESSION: 1. Moderate changes of bronchitis and/or asthma which may be acute or chronic. No localized airspace pneumonia. 2. COPD/emphysema suspected. 3. Mild cardiomegaly without pulmonary edema. Electronically Signed   By: Evangeline Dakin M.D.   On: 11/30/2013 13:18    Assessment & Plan:   There are no diagnoses linked to this encounter. I have discontinued Erin [name] amoxicillin-clavulanate. I am also having her maintain her cholecalciferol, spironolactone, celecoxib, carvedilol, ibuprofen, loratadine, tretinoin, potassium chloride, and gabapentin.  No orders of the defined types were placed in this encounter.    Follow-up: No Follow-up on file.  Walker Kehr, MD

## 2016-09-22 NOTE — Assessment & Plan Note (Addendum)
Pt asked a B12 shot She is aware it may not be covered Labs

## 2016-09-22 NOTE — Addendum Note (Signed)
Addended by: Cresenciano Lick on: 09/22/2016 03:32 PM   Modules accepted: Orders

## 2016-09-22 NOTE — Assessment & Plan Note (Addendum)
LLE Dr Theda Sers Ice/heat NSAIDs - Meloxicam  Potential benefits of NSAID use as well as potential risks  and complications were explained to the patient and were aknowledged. Heel lift 1/4 inch in the left shoe

## 2016-09-22 NOTE — Assessment & Plan Note (Signed)
Nl BP at home per pt Coreg

## 2016-09-22 NOTE — Assessment & Plan Note (Signed)
Uric acid

## 2016-09-22 NOTE — Patient Instructions (Addendum)
Ice/heat  Heel lift 1/4 inch in the left shoe    Achilles Tendinitis Achilles tendinitis is inflammation of the tough, cord-like band that attaches the lower muscles of your leg to your heel (Achilles tendon). It is usually caused by overusing the tendon and joint involved.  CAUSES Achilles tendinitis can happen because of:  A sudden increase in exercise or activity (such as running).  Doing the same exercises or activities (such as jumping) over and over.  Not warming up calf muscles before exercising.  Exercising in shoes that are worn out or not made for exercise.  Having arthritis or a bone growth on the back of the heel bone. This can rub against the tendon and hurt the tendon. SIGNS AND SYMPTOMS The most common symptoms are:  Pain in the back of the leg, just above the heel. The pain usually gets worse with exercise and better with rest.  Stiffness or soreness in the back of the leg, especially in the morning.  Swelling of the skin over the Achilles tendon.  Trouble standing on tiptoe. Sometimes, an Achilles tendon tears (ruptures). Symptoms of an Achilles tendon rupture can include:  Sudden, severe pain in the back of the leg.  Trouble putting weight on the foot or walking normally. DIAGNOSIS Achilles tendinitis will be diagnosed based on symptoms and a physical examination. An X-ray may be done to check if another condition is causing your symptoms. An MRI may be ordered if your health care provider suspects you may have completely torn your tendon, which is called an Achilles tendon rupture.  TREATMENT  Achilles tendinitis usually gets better over time. It can take weeks to months to heal completely. Treatment focuses on treating the symptoms and helping the injury heal. HOME CARE INSTRUCTIONS   Rest your Achilles tendon and avoid activities that cause pain.  Apply ice to the injured area:  Put ice in a plastic bag.  Place a towel between your skin and the  bag.  Leave the ice on for 20 minutes, 2-3 times a day  Try to avoid using the tendon (other than gentle range of motion) while the tendon is painful. Do not resume use until instructed by your health care provider. Then begin use gradually. Do not increase use to the point of pain. If pain does develop, decrease use and continue the above measures. Gradually increase activities that do not cause discomfort until you achieve normal use.  Do exercises to make your calf muscles stronger and more flexible. Your health care provider or physical therapist can recommend exercises for you to do.  Wrap your ankle with an elastic bandage or other wrap. This can help keep your tendon from moving too much. Your health care provider will show you how to wrap your ankle correctly.  Only take over-the-counter or prescription medicines for pain, discomfort, or fever as directed by your health care provider. SEEK MEDICAL CARE IF:   Your pain and swelling increase or pain is uncontrolled with medicines.  You develop new, unexplained symptoms or your symptoms get worse.  You are unable to move your toes or foot.  You develop warmth and swelling in your foot.  You have an unexplained temperature. MAKE SURE YOU:   Understand these instructions.  Will watch your condition.  Will get help right away if you are not doing well or get worse. This information is not intended to replace advice given to you by your health care provider. Make sure you discuss any questions  you have with your health care provider. Document Released: 07/21/2005 Document Revised: 11/01/2014 Document Reviewed: 05/23/2013 Elsevier Interactive Patient Education  2017 [employer].

## 2016-09-22 NOTE — Progress Notes (Signed)
Pre visit review using our clinic review tool, if applicable. No additional management support is needed unless otherwise documented below in the visit note. 

## 2016-09-22 NOTE — Addendum Note (Signed)
Addended by: Cresenciano Lick on: 09/22/2016 03:02 PM   Modules accepted: Orders

## 2016-09-28 DIAGNOSIS — M1712 Unilateral primary osteoarthritis, left knee: Secondary | ICD-10-CM | POA: Diagnosis not present

## 2016-10-05 DIAGNOSIS — M1712 Unilateral primary osteoarthritis, left knee: Secondary | ICD-10-CM | POA: Diagnosis not present

## 2016-10-05 DIAGNOSIS — M7662 Achilles tendinitis, left leg: Secondary | ICD-10-CM | POA: Diagnosis not present

## 2016-10-12 DIAGNOSIS — M1712 Unilateral primary osteoarthritis, left knee: Secondary | ICD-10-CM | POA: Diagnosis not present

## 2016-10-13 DIAGNOSIS — M7662 Achilles tendinitis, left leg: Secondary | ICD-10-CM | POA: Diagnosis not present

## 2016-11-01 ENCOUNTER — Other Ambulatory Visit: Payer: Self-pay | Admitting: Internal Medicine

## 2016-11-01 DIAGNOSIS — M7662 Achilles tendinitis, left leg: Secondary | ICD-10-CM | POA: Diagnosis not present

## 2017-02-08 ENCOUNTER — Ambulatory Visit (INDEPENDENT_AMBULATORY_CARE_PROVIDER_SITE_OTHER): Payer: Medicare Other | Admitting: Ophthalmology

## 2017-02-08 ENCOUNTER — Telehealth: Payer: Self-pay

## 2017-02-08 NOTE — Telephone Encounter (Signed)
PA started for Tretinoin throught Cover My Meds. Key: [id number]

## 2017-02-09 ENCOUNTER — Ambulatory Visit (INDEPENDENT_AMBULATORY_CARE_PROVIDER_SITE_OTHER): Payer: Medicare Other | Admitting: Ophthalmology

## 2017-02-09 DIAGNOSIS — H353121 Nonexudative age-related macular degeneration, left eye, early dry stage: Secondary | ICD-10-CM | POA: Diagnosis not present

## 2017-02-09 DIAGNOSIS — H35371 Puckering of macula, right eye: Secondary | ICD-10-CM | POA: Diagnosis not present

## 2017-02-09 DIAGNOSIS — H35033 Hypertensive retinopathy, bilateral: Secondary | ICD-10-CM

## 2017-02-09 DIAGNOSIS — I10 Essential (primary) hypertension: Secondary | ICD-10-CM | POA: Diagnosis not present

## 2017-02-09 DIAGNOSIS — H348312 Tributary (branch) retinal vein occlusion, right eye, stable: Secondary | ICD-10-CM

## 2017-02-09 DIAGNOSIS — H43813 Vitreous degeneration, bilateral: Secondary | ICD-10-CM | POA: Diagnosis not present

## 2017-02-10 ENCOUNTER — Other Ambulatory Visit (INDEPENDENT_AMBULATORY_CARE_PROVIDER_SITE_OTHER): Payer: Medicare Other

## 2017-02-10 ENCOUNTER — Other Ambulatory Visit: Payer: Medicare Other

## 2017-02-10 ENCOUNTER — Encounter: Payer: Self-pay | Admitting: Internal Medicine

## 2017-02-10 ENCOUNTER — Telehealth: Payer: Self-pay | Admitting: Internal Medicine

## 2017-02-10 ENCOUNTER — Ambulatory Visit (INDEPENDENT_AMBULATORY_CARE_PROVIDER_SITE_OTHER): Payer: Medicare Other | Admitting: Internal Medicine

## 2017-02-10 ENCOUNTER — Other Ambulatory Visit: Payer: Self-pay | Admitting: Internal Medicine

## 2017-02-10 DIAGNOSIS — M5441 Lumbago with sciatica, right side: Secondary | ICD-10-CM | POA: Diagnosis not present

## 2017-02-10 DIAGNOSIS — G8929 Other chronic pain: Secondary | ICD-10-CM

## 2017-02-10 DIAGNOSIS — R5383 Other fatigue: Secondary | ICD-10-CM

## 2017-02-10 DIAGNOSIS — M79604 Pain in right leg: Secondary | ICD-10-CM

## 2017-02-10 DIAGNOSIS — R8290 Unspecified abnormal findings in urine: Secondary | ICD-10-CM | POA: Diagnosis not present

## 2017-02-10 DIAGNOSIS — I10 Essential (primary) hypertension: Secondary | ICD-10-CM | POA: Diagnosis not present

## 2017-02-10 DIAGNOSIS — R1012 Left upper quadrant pain: Secondary | ICD-10-CM

## 2017-02-10 LAB — URINALYSIS, ROUTINE W REFLEX MICROSCOPIC
Bilirubin Urine: NEGATIVE
Ketones, ur: NEGATIVE
Nitrite: POSITIVE — AB
Specific Gravity, Urine: 1.02 (ref 1.000–1.030)
Total Protein, Urine: 100 — AB
Urine Glucose: NEGATIVE
Urobilinogen, UA: 0.2 (ref 0.0–1.0)
pH: 6 (ref 5.0–8.0)

## 2017-02-10 MED ORDER — CEFUROXIME AXETIL 500 MG PO TABS: 500.0000 mg | ORAL_TABLET | Freq: Two times a day (BID) | ORAL | 1 refills | Status: DC

## 2017-02-10 MED ORDER — TRETINOIN 0.1 % EX CREA: TOPICAL_CREAM | Freq: Every day | CUTANEOUS | 3 refills | Status: DC

## 2017-02-10 NOTE — Telephone Encounter (Signed)
Pt called wanting results from today.

## 2017-02-10 NOTE — Assessment & Plan Note (Signed)
UA

## 2017-02-10 NOTE — Progress Notes (Signed)
Subjective:  Patient ID: Erin [name], female    DOB: 1935/04/29  Age: 82 y.o. MRN: [id number]  CC: No chief complaint on file.   HPI Erin [name] presents for a ?bladder infectio - urgency C/o LLE pain x4 mo C/o not feeling well - tired  Outpatient Medications Prior to Visit  Medication Sig Dispense Refill  . carvedilol (COREG) 25 MG tablet TAKE ONE TABLET BY MOUTH TWICE DAILY WITH MEALS 180 tablet 3  . celecoxib (CELEBREX) 200 MG capsule Take 1 capsule (200 mg total) by mouth 2 (two) times daily as needed. For arthritis 180 capsule 3  . Cholecalciferol (VITAMIN D3) 1000 UNITS tablet Take 1,000 Units by mouth daily.      Marland Kitchen gabapentin (NEURONTIN) 100 MG capsule Take 1 capsule (100 mg total) by mouth 3 (three) times daily. For back and legs symptoms 90 capsule 3  . ibuprofen (ADVIL,MOTRIN) 800 MG tablet Take 1 tablet (800 mg total) by mouth 3 (three) times daily with meals. 60 tablet 5  . loratadine (CLARITIN) 10 MG tablet TAKE ONE TABLET BY MOUTH EVERY DAY AS NEEDED FOR ALLERGY 90 tablet 3  . meloxicam (MOBIC) 15 MG tablet Take 1 tablet (15 mg total) by mouth daily. 30 tablet 0  . potassium chloride (KLOR-CON 10) 10 MEQ tablet Take 1 tablet (10 mEq total) by mouth daily. With Lasix 90 tablet 3  . tretinoin (RETIN-A) 0.1 % cream Apply topically at bedtime. 45 g 3  . spironolactone (ALDACTONE) 25 MG tablet Take 2 tablets (50 mg total) by mouth daily. 180 tablet 3   No facility-administered medications prior to visit.     ROS Review of Systems  Constitutional: Positive for fatigue.  Genitourinary: Positive for frequency and urgency.  Musculoskeletal: Positive for arthralgias.    Objective:  BP (!) 146/82 (BP Location: Left Arm, Patient Position: Sitting, Cuff Size: Large)   Pulse (!) 54   Temp 98.1 F (36.7 C) (Oral)   Ht 5\' 3"  (1.6 m)   Wt 137 lb 0.6 oz (62.2 kg)   SpO2 97%   BMI 24.28 kg/m   BP Readings from Last 3 Encounters:  02/10/17 (!) 146/82  09/22/16 (!)  160/80  10/13/15 (!) 173/78    Wt Readings from Last 3 Encounters:  02/10/17 137 lb 0.6 oz (62.2 kg)  09/22/16 142 lb (64.4 kg)  10/13/15 143 lb (64.9 kg)    Physical Exam  Constitutional: She appears well-developed. No distress.  HENT:  Head: Normocephalic.  Right Ear: External ear normal.  Left Ear: External ear normal.  Nose: Nose normal.  Mouth/Throat: Oropharynx is clear and moist.  Eyes: Conjunctivae are normal. Pupils are equal, round, and reactive to light. Right eye exhibits no discharge. Left eye exhibits no discharge.  Neck: Normal range of motion. Neck supple. No JVD present. No tracheal deviation present. No thyromegaly present.  Cardiovascular: Normal rate, regular rhythm and normal heart sounds.   Pulmonary/Chest: No stridor. No respiratory distress. She has no wheezes.  Abdominal: Soft. Bowel sounds are normal. She exhibits no distension and no mass. There is no tenderness. There is no rebound and no guarding.  Musculoskeletal: She exhibits tenderness. She exhibits no edema.  Lymphadenopathy:    She has no cervical adenopathy.  Neurological: She displays normal reflexes. No cranial nerve deficit. She exhibits normal muscle tone. Coordination normal.  Skin: No rash noted. No erythema.  Psychiatric: She has a normal mood and affect. Her behavior is normal. Judgment and thought content normal.  L achilles tendon is painful  Lab Results  Component Value Date   WBC 7.4 09/22/2016   HGB 13.7 09/22/2016   HCT 41.3 09/22/2016   PLT 265.0 09/22/2016   GLUCOSE 95 09/22/2016   CHOL 149 09/19/2012   TRIG 18.0 09/19/2012   HDL 91.40 09/19/2012   LDLCALC 54 09/19/2012   ALT 15 09/22/2016   AST 19 09/22/2016   NA 144 09/22/2016   K 4.0 09/22/2016   CL 103 09/22/2016   CREATININE 0.81 09/22/2016   BUN 28 (H) 09/22/2016   CO2 33 (H) 09/22/2016   TSH 2.49 09/22/2016    Dg Chest 2 View  Result Date: 11/30/2013 CLINICAL DATA:  One week history of cough and chest  congestion. EXAM: CHEST  2 VIEW COMPARISON:  None. FINDINGS: Cardiac silhouette mildly enlarged. Thoracic aorta tortuous and mildly atherosclerotic. Hilar and mediastinal contours otherwise unremarkable. Emphysematous changes suspected in the upper lobes, left greater than right. Prominent bronchovascular markings diffusely and mild central peribronchial thickening. Lungs otherwise clear. No localized airspace consolidation. No pleural effusions. No pneumothorax. Normal pulmonary vascularity. Mild degenerative changes involving the thoracic spine. IMPRESSION: 1. Moderate changes of bronchitis and/or asthma which may be acute or chronic. No localized airspace pneumonia. 2. COPD/emphysema suspected. 3. Mild cardiomegaly without pulmonary edema. Electronically Signed   By: Evangeline Dakin M.D.   On: 11/30/2013 13:18    Assessment & Plan:   There are no diagnoses linked to this encounter. I am having [name] maintain her cholecalciferol, spironolactone, celecoxib, gabapentin, ibuprofen, loratadine, potassium chloride, meloxicam, tretinoin, and carvedilol.  No orders of the defined types were placed in this encounter.    Follow-up: No Follow-up on file.  Walker Kehr, MD

## 2017-02-10 NOTE — Assessment & Plan Note (Signed)
Labs reviewed.

## 2017-02-10 NOTE — Assessment & Plan Note (Signed)
L Achilles tendonitis - not better Sports med ref

## 2017-02-10 NOTE — Progress Notes (Signed)
Pre visit review using our clinic review tool, if applicable. No additional management support is needed unless otherwise documented below in the visit note. 

## 2017-02-10 NOTE — Telephone Encounter (Signed)
Please advise on UA, did add a culture.

## 2017-02-10 NOTE — Assessment & Plan Note (Signed)
Coreg

## 2017-02-11 NOTE — Telephone Encounter (Signed)
Pt.notified

## 2017-02-11 NOTE — Telephone Encounter (Signed)
UTI - abx emailed Thx

## 2017-02-12 LAB — URINE CULTURE

## 2017-02-17 NOTE — Telephone Encounter (Signed)
You can add acne vulgaris Thx

## 2017-02-17 NOTE — Telephone Encounter (Signed)
Appeal started and faxed

## 2017-02-17 NOTE — Telephone Encounter (Signed)
PA was denied due to patient not having diagnosis of acne vulgaris. Do you recommend anything else, please advise.

## 2017-02-21 ENCOUNTER — Other Ambulatory Visit: Payer: Medicare Other

## 2017-03-04 DIAGNOSIS — M7662 Achilles tendinitis, left leg: Secondary | ICD-10-CM | POA: Diagnosis not present

## 2017-03-07 ENCOUNTER — Telehealth: Payer: Self-pay | Admitting: Internal Medicine

## 2017-03-07 MED ORDER — CEFUROXIME AXETIL 500 MG PO TABS: 500.0000 mg | ORAL_TABLET | Freq: Two times a day (BID) | ORAL | 1 refills | Status: DC

## 2017-03-07 NOTE — Telephone Encounter (Signed)
Please advise, RX was sent in with 1 refill, does pt need to come back in.

## 2017-03-07 NOTE — Telephone Encounter (Signed)
Refilled  Thx

## 2017-03-07 NOTE — Telephone Encounter (Signed)
Pt called in said that her UTI is not better and she has finished her last round of her meds.  She would like more called in.  Pharmacy- [facility] in [place]

## 2017-03-08 NOTE — Telephone Encounter (Signed)
LM notifying pt

## 2017-03-10 ENCOUNTER — Ambulatory Visit: Payer: Medicare Other | Admitting: Family Medicine

## 2017-03-10 NOTE — Telephone Encounter (Signed)
PA approved for 1 year.

## 2017-03-16 DIAGNOSIS — M7662 Achilles tendinitis, left leg: Secondary | ICD-10-CM | POA: Diagnosis not present

## 2017-03-24 ENCOUNTER — Encounter: Payer: Self-pay | Admitting: Internal Medicine

## 2017-03-24 ENCOUNTER — Ambulatory Visit (INDEPENDENT_AMBULATORY_CARE_PROVIDER_SITE_OTHER): Payer: Medicare Other | Admitting: Internal Medicine

## 2017-03-24 ENCOUNTER — Other Ambulatory Visit (INDEPENDENT_AMBULATORY_CARE_PROVIDER_SITE_OTHER): Payer: Medicare Other

## 2017-03-24 VITALS — BP 156/72 | HR 62 | Temp 97.9°F | Ht 63.0 in | Wt 138.0 lb

## 2017-03-24 DIAGNOSIS — N3090 Cystitis, unspecified without hematuria: Secondary | ICD-10-CM

## 2017-03-24 DIAGNOSIS — I10 Essential (primary) hypertension: Secondary | ICD-10-CM | POA: Diagnosis not present

## 2017-03-24 DIAGNOSIS — Z23 Encounter for immunization: Secondary | ICD-10-CM | POA: Diagnosis not present

## 2017-03-24 DIAGNOSIS — M7662 Achilles tendinitis, left leg: Secondary | ICD-10-CM | POA: Diagnosis not present

## 2017-03-24 DIAGNOSIS — R252 Cramp and spasm: Secondary | ICD-10-CM | POA: Diagnosis not present

## 2017-03-24 DIAGNOSIS — M5441 Lumbago with sciatica, right side: Secondary | ICD-10-CM

## 2017-03-24 DIAGNOSIS — G8929 Other chronic pain: Secondary | ICD-10-CM | POA: Diagnosis not present

## 2017-03-24 LAB — URINALYSIS, ROUTINE W REFLEX MICROSCOPIC
Bilirubin Urine: NEGATIVE
Hgb urine dipstick: NEGATIVE
Ketones, ur: NEGATIVE
Nitrite: NEGATIVE
RBC / HPF: NONE SEEN (ref 0–?)
Specific Gravity, Urine: 1.015 (ref 1.000–1.030)
Total Protein, Urine: NEGATIVE
Urine Glucose: NEGATIVE
Urobilinogen, UA: 0.2 (ref 0.0–1.0)
pH: 6.5 (ref 5.0–8.0)

## 2017-03-24 LAB — BASIC METABOLIC PANEL
BUN: 25 mg/dL — ABNORMAL HIGH (ref 6–23)
CO2: 30 mEq/L (ref 19–32)
Calcium: 9.3 mg/dL (ref 8.4–10.5)
Chloride: 104 mEq/L (ref 96–112)
Creatinine, Ser: 0.7 mg/dL (ref 0.40–1.20)
GFR: 85.56 mL/min (ref >60.00)
Glucose, Bld: 106 mg/dL — ABNORMAL HIGH (ref 70–99)
Potassium: 4.6 mEq/L (ref 3.5–5.1)
Sodium: 142 mEq/L (ref 135–145)

## 2017-03-24 MED ORDER — MELOXICAM 7.5 MG PO TABS: 7.5000 mg | ORAL_TABLET | Freq: Every day | ORAL | 1 refills | Status: DC | PRN

## 2017-03-24 MED ORDER — POTASSIUM CHLORIDE ER 10 MEQ PO TBCR: 10.0000 meq | EXTENDED_RELEASE_TABLET | Freq: Every day | ORAL | 3 refills | Status: DC

## 2017-03-24 NOTE — Assessment & Plan Note (Signed)
Repeat UA 

## 2017-03-24 NOTE — Assessment & Plan Note (Signed)
Resolved Pt declined abd [place]

## 2017-03-24 NOTE — Progress Notes (Signed)
Subjective:  Patient ID: Erin [name], female    DOB: 1935/02/27  Age: 82 y.o. MRN: [id number]  CC: No chief complaint on file.   HPI Erin [name] presents for UTI, LBP - resolved, low potassium - relieves cramps f/u. Shingrix discussed  Outpatient Medications Prior to Visit  Medication Sig Dispense Refill  . carvedilol (COREG) 25 MG tablet TAKE ONE TABLET BY MOUTH TWICE DAILY WITH MEALS 180 tablet 3  . cefUROXime (CEFTIN) 500 MG tablet Take 1 tablet (500 mg total) by mouth 2 (two) times daily with a meal. 14 tablet 1  . celecoxib (CELEBREX) 200 MG capsule Take 1 capsule (200 mg total) by mouth 2 (two) times daily as needed. For arthritis 180 capsule 3  . Cholecalciferol (VITAMIN D3) 1000 UNITS tablet Take 1,000 Units by mouth daily.      Marland Kitchen gabapentin (NEURONTIN) 100 MG capsule Take 1 capsule (100 mg total) by mouth 3 (three) times daily. For back and legs symptoms 90 capsule 3  . ibuprofen (ADVIL,MOTRIN) 800 MG tablet Take 1 tablet (800 mg total) by mouth 3 (three) times daily with meals. 60 tablet 5  . loratadine (CLARITIN) 10 MG tablet TAKE ONE TABLET BY MOUTH EVERY DAY AS NEEDED FOR ALLERGY 90 tablet 3  . meloxicam (MOBIC) 15 MG tablet Take 1 tablet (15 mg total) by mouth daily. 30 tablet 0  . potassium chloride (KLOR-CON 10) 10 MEQ tablet Take 1 tablet (10 mEq total) by mouth daily. With Lasix 90 tablet 3  . tretinoin (RETIN-A) 0.1 % cream Apply topically at bedtime. 45 g 3  . spironolactone (ALDACTONE) 25 MG tablet Take 2 tablets (50 mg total) by mouth daily. 180 tablet 3   No facility-administered medications prior to visit.     ROS Review of Systems  Constitutional: Negative for activity change, appetite change, chills, fatigue and unexpected weight change.  HENT: Negative for congestion, mouth sores and sinus pressure.   Eyes: Negative for visual disturbance.  Respiratory: Negative for cough and chest tightness.   Gastrointestinal: Negative for abdominal pain and  nausea.  Genitourinary: Negative for difficulty urinating, frequency, urgency and vaginal pain.  Musculoskeletal: Negative for back pain and gait problem.  Skin: Negative for pallor and rash.  Neurological: Negative for dizziness, tremors, weakness, numbness and headaches.  Psychiatric/Behavioral: Negative for confusion, sleep disturbance and suicidal ideas.    Objective:  BP (!) 156/72 (BP Location: Left Arm, Patient Position: Sitting, Cuff Size: Normal)   Pulse 62   Temp 97.9 F (36.6 C) (Oral)   Ht 5\' 3"  (1.6 m)   Wt 138 lb (62.6 kg)   SpO2 97%   BMI 24.45 kg/m   BP Readings from Last 3 Encounters:  03/24/17 (!) 156/72  02/10/17 (!) 146/82  09/22/16 (!) 160/80    Wt Readings from Last 3 Encounters:  03/24/17 138 lb (62.6 kg)  02/10/17 137 lb 0.6 oz (62.2 kg)  09/22/16 142 lb (64.4 kg)    Physical Exam  Constitutional: She appears well-developed. No distress.  HENT:  Head: Normocephalic.  Right Ear: External ear normal.  Left Ear: External ear normal.  Nose: Nose normal.  Mouth/Throat: Oropharynx is clear and moist.  Eyes: Conjunctivae are normal. Pupils are equal, round, and reactive to light. Right eye exhibits no discharge. Left eye exhibits no discharge.  Neck: Normal range of motion. Neck supple. No JVD present. No tracheal deviation present. No thyromegaly present.  Cardiovascular: Normal rate, regular rhythm and normal heart sounds.  Pulmonary/Chest: No stridor. No respiratory distress. She has no wheezes.  Abdominal: Soft. Bowel sounds are normal. She exhibits no distension and no mass. There is no tenderness. There is no rebound and no guarding.  Musculoskeletal: She exhibits no edema or tenderness.  Lymphadenopathy:    She has no cervical adenopathy.  Neurological: She displays normal reflexes. No cranial nerve deficit. She exhibits normal muscle tone. Coordination normal.  Skin: No rash noted. No erythema.  Psychiatric: She has a normal mood and affect.  Her behavior is normal. Judgment and thought content normal.    Lab Results  Component Value Date   WBC 7.4 09/22/2016   HGB 13.7 09/22/2016   HCT 41.3 09/22/2016   PLT 265.0 09/22/2016   GLUCOSE 95 09/22/2016   CHOL 149 09/19/2012   TRIG 18.0 09/19/2012   HDL 91.40 09/19/2012   LDLCALC 54 09/19/2012   ALT 15 09/22/2016   AST 19 09/22/2016   NA 144 09/22/2016   K 4.0 09/22/2016   CL 103 09/22/2016   CREATININE 0.81 09/22/2016   BUN 28 (H) 09/22/2016   CO2 33 (H) 09/22/2016   TSH 2.49 09/22/2016    Dg Chest 2 View  Result Date: 11/30/2013 CLINICAL DATA:  One week history of cough and chest congestion. EXAM: CHEST  2 VIEW COMPARISON:  None. FINDINGS: Cardiac silhouette mildly enlarged. Thoracic aorta tortuous and mildly atherosclerotic. Hilar and mediastinal contours otherwise unremarkable. Emphysematous changes suspected in the upper lobes, left greater than right. Prominent bronchovascular markings diffusely and mild central peribronchial thickening. Lungs otherwise clear. No localized airspace consolidation. No pleural effusions. No pneumothorax. Normal pulmonary vascularity. Mild degenerative changes involving the thoracic spine. IMPRESSION: 1. Moderate changes of bronchitis and/or asthma which may be acute or chronic. No localized airspace pneumonia. 2. COPD/emphysema suspected. 3. Mild cardiomegaly without pulmonary edema. Electronically Signed   By: Evangeline Dakin M.D.   On: 11/30/2013 13:18    Assessment & Plan:   There are no diagnoses linked to this encounter. I am having Ms. Horace maintain her cholecalciferol, spironolactone, celecoxib, gabapentin, ibuprofen, loratadine, potassium chloride, meloxicam, carvedilol, tretinoin, and cefUROXime.  No orders of the defined types were placed in this encounter.    Follow-up: No Follow-up on file.  Walker Kehr, MD

## 2017-03-24 NOTE — Assessment & Plan Note (Signed)
Coreg

## 2017-03-24 NOTE — Addendum Note (Signed)
Addended by: Karren Cobble on: 03/24/2017 10:36 AM   Modules accepted: Orders

## 2017-03-24 NOTE — Patient Instructions (Signed)
MC well w/Jill 

## 2017-03-24 NOTE — Assessment & Plan Note (Signed)
Meloxicam PRN

## 2017-03-24 NOTE — Assessment & Plan Note (Signed)
Remote low K KCL helps w/cramps - will cont Monitor BMET

## 2017-05-13 ENCOUNTER — Encounter: Payer: Self-pay | Admitting: Family Medicine

## 2017-05-13 ENCOUNTER — Ambulatory Visit (INDEPENDENT_AMBULATORY_CARE_PROVIDER_SITE_OTHER): Payer: Medicare Other | Admitting: Family Medicine

## 2017-05-13 ENCOUNTER — Other Ambulatory Visit: Payer: Medicare Other

## 2017-05-13 VITALS — BP 200/88 | HR 68 | Temp 98.0°F | Resp 12 | Ht 63.0 in | Wt 141.0 lb

## 2017-05-13 DIAGNOSIS — R110 Nausea: Secondary | ICD-10-CM | POA: Diagnosis not present

## 2017-05-13 DIAGNOSIS — I10 Essential (primary) hypertension: Secondary | ICD-10-CM

## 2017-05-13 DIAGNOSIS — M545 Low back pain, unspecified: Secondary | ICD-10-CM

## 2017-05-13 DIAGNOSIS — N3090 Cystitis, unspecified without hematuria: Secondary | ICD-10-CM | POA: Diagnosis not present

## 2017-05-13 LAB — POCT URINALYSIS DIPSTICK
Bilirubin, UA: NEGATIVE
Blood, UA: 10
Glucose, UA: NEGATIVE
Ketones, UA: NEGATIVE
Nitrite, UA: NEGATIVE
Protein, UA: NEGATIVE
Spec Grav, UA: >=1.03 — AB (ref 1.010–1.025)
Urobilinogen, UA: 0.2 E.U./dL
pH, UA: 6 (ref 5.0–8.0)

## 2017-05-13 MED ORDER — CEFUROXIME AXETIL 250 MG PO TABS: 250.0000 mg | ORAL_TABLET | Freq: Two times a day (BID) | ORAL | 0 refills | Status: DC

## 2017-05-13 MED ORDER — ONDANSETRON 4 MG PO TBDP: 4.0000 mg | ORAL_TABLET | Freq: Three times a day (TID) | ORAL | 0 refills | Status: DC | PRN

## 2017-05-13 NOTE — Progress Notes (Signed)
Subjective:    Patient ID: Erin [name], female    DOB: [birth year], 82 y.o.   MRN: [id number]  HPI This is an 82 yo female who presents today with back pain x 3 days. No known trauma. Pain is on right side of low back, constant, "just hurts." No urinary symptoms. Had kidney stone in past, only had hematuria.  Two days ago felt like she was coming down with something. Yesterday pain with getting up and down.  Takes meloxicam 15 mg- no relief. Took one of her daughter's pain pills (Norco?) without relief. Pain worse when she first got up or if she sat for long period of time yesterday. No diarrhea/constipation, no dysuria, no hematuria, no frequency. No incontinence of bowels or bladder. No numbness, no tingling, no falls or weakness.   Blood pressure high this morning. Has not taken antihypertensives last night or today, has been mildly nauseous.   Past Medical History:  Diagnosis Date  . Hypertension   . Low back pain   . OA (osteoarthritis)   . Osteopenia    Past Surgical History:  Procedure Laterality Date  . LUMBAR LAMINECTOMY     Family History  Problem Relation Age of Onset  . Hypertension Other   . Hypertension Mother    Social History  Substance Use Topics  . Smoking status: Former Research scientist (life sciences)  . Smokeless tobacco: Never Used     Comment: As a teenager  . Alcohol use No     Review of Systems Per HPI    Objective:   Physical Exam  Constitutional: She is oriented to person, place, and time. She appears well-developed and well-nourished. No distress.  Appears mildly uncomfortable.   HENT:  Head: Normocephalic and atraumatic.  Eyes: Conjunctivae are normal.  Cardiovascular: Normal rate, regular rhythm and normal heart sounds.   Pulmonary/Chest: Effort normal and breath sounds normal.  Abdominal: Soft. Bowel sounds are normal. There is no tenderness. There is no CVA tenderness.  Musculoskeletal:       Lumbar back: She exhibits tenderness. She exhibits normal range  of motion and no bony tenderness.       Arms: Negative straight leg raise.   Neurological: She is alert and oriented to person, place, and time.  Skin: Skin is warm and dry. She is not diaphoretic.  Psychiatric: She has a normal mood and affect. Her behavior is normal. Judgment and thought content normal.  Vitals reviewed.     BP (!) 210/98 (BP Location: Left Arm, Patient Position: Sitting, Cuff Size: Normal) Comment: checked both arms, 200/96 on right  Pulse 68   Temp 98 F (36.7 C) (Oral)   Resp 12   Ht 5\' 3"  (1.6 m)   Wt 141 lb (64 kg)   SpO2 93%   BMI 24.98 kg/m  Wt Readings from Last 3 Encounters:  05/13/17 141 lb (64 kg)  03/24/17 138 lb (62.6 kg)  02/10/17 137 lb 0.6 oz (62.2 kg)   BP Readings from Last 3 Encounters:  05/13/17 (!) 210/98  03/24/17 (!) 156/72  02/10/17 (!) 146/82   Results for orders placed or performed in visit on 05/13/17  POCT urinalysis dipstick  Result Value Ref Range   Color, UA yellow    Clarity, UA slightly cloudy    Glucose, UA neg    Bilirubin, UA neg    Ketones, UA neg    Spec Grav, UA >=1.030 (A) 1.010 - 1.025   Blood, UA 10 Ery/uL    pH,  UA 6.0 5.0 - 8.0   Protein, UA neg    Urobilinogen, UA 0.2 0.2 or 1.0 E.U./dL   Nitrite, UA neg    Leukocytes, UA Moderate (2+) (A) Negative       Assessment & Plan:  Discussed with Dr. Alain Marion  1. Acute right-sided low back pain without sciatica - POCT urinalysis dipstick - exam unremarkable, no rash  2. Cystitis - Provided written and verbal information regarding diagnosis and treatment. - Urine Microscopic - Urine Culture - cefUROXime (CEFTIN) 250 MG tablet; Take 1 tablet (250 mg total) by mouth 2 (two) times daily with a meal.  Dispense: 14 tablet; Refill: 0 - RTC precautions reviewed  3. Nausea - ondansetron (ZOFRAN-ODT) 4 MG disintegrating tablet; Take 1 tablet (4 mg total) by mouth every 8 (eight) hours as needed for nausea.  Dispense: 20 tablet; Refill: 0  4. Essential  hypertension - patient will check her blood pressure and notify us if over 140/90.   Clarene Reamer, FNP-BC  [facility]  05/13/2017 11:10 AM

## 2017-05-13 NOTE — Patient Instructions (Addendum)
If you can't keep anything down, get worsening pain or have fever over 100- please go to emergency room I have sent in an antibiotic and anti-nausea medication to your pharmacy Drink enough water to make your urine light yellow  Urinary Tract Infection, Adult A urinary tract infection (UTI) is an infection of any part of the urinary tract, which includes the kidneys, ureters, bladder, and urethra. These organs make, store, and get rid of urine in the body. UTI can be a bladder infection (cystitis) or kidney infection (pyelonephritis). What are the causes? This infection may be caused by fungi, viruses, or bacteria. Bacteria are the most common cause of UTIs. This condition can also be caused by repeated incomplete emptying of the bladder during urination. What increases the risk? This condition is more likely to develop if:  You ignore your need to urinate or hold urine for long periods of time.  You do not empty your bladder completely during urination.  You wipe back to front after urinating or having a bowel movement, if you are female.  You are uncircumcised, if you are female.  You are constipated.  You have a urinary catheter that stays in place (indwelling).  You have a weak defense (immune) system.  You have a medical condition that affects your bowels, kidneys, or bladder.  You have diabetes.  You take antibiotic medicines frequently or for long periods of time, and the antibiotics no longer work well against certain types of infections (antibiotic resistance).  You take medicines that irritate your urinary tract.  You are exposed to chemicals that irritate your urinary tract.  You are female.  What are the signs or symptoms? Symptoms of this condition include:  Fever.  Frequent urination or passing small amounts of urine frequently.  Needing to urinate urgently.  Pain or burning with urination.  Urine that smells bad or unusual.  Cloudy urine.  Pain in the  lower abdomen or back.  Trouble urinating.  Blood in the urine.  Vomiting or being less hungry than normal.  Diarrhea or abdominal pain.  Vaginal discharge, if you are female.  How is this diagnosed? This condition is diagnosed with a medical history and physical exam. You will also need to provide a urine sample to test your urine. Other tests may be done, including:  Blood tests.  Sexually transmitted disease (STD) testing.  If you have had more than one UTI, a cystoscopy or imaging studies may be done to determine the cause of the infections. How is this treated? Treatment for this condition often includes a combination of two or more of the following:  Antibiotic medicine.  Other medicines to treat less common causes of UTI.  Over-the-counter medicines to treat pain.  Drinking enough water to stay hydrated.  Follow these instructions at home:  Take over-the-counter and prescription medicines only as told by your health care provider.  If you were prescribed an antibiotic, take it as told by your health care provider. Do not stop taking the antibiotic even if you start to feel better.  Avoid alcohol, caffeine, tea, and carbonated beverages. They can irritate your bladder.  Drink enough fluid to keep your urine clear or pale yellow.  Keep all follow-up visits as told by your health care provider. This is important.  Make sure to: ? Empty your bladder often and completely. Do not hold urine for long periods of time. ? Empty your bladder before and after sex. ? Wipe from front to back after a  bowel movement if you are female. Use each tissue one time when you wipe. Contact a health care provider if:  You have back pain.  You have a fever.  You feel nauseous or vomit.  Your symptoms do not get better after 3 days.  Your symptoms go away and then return. Get help right away if:  You have severe back pain or lower abdominal pain.  You are vomiting and cannot  keep down any medicines or water. This information is not intended to replace advice given to you by your health care provider. Make sure you discuss any questions you have with your health care provider. Document Released: 07/21/2005 Document Revised: 03/24/2016 Document Reviewed: 09/01/2015 Elsevier Interactive Patient Education  2017 [employer].

## 2017-05-13 NOTE — Addendum Note (Signed)
Addended by: Clarene Reamer B on: 05/13/2017 03:12 PM   Modules accepted: Orders

## 2017-05-14 LAB — URINE CULTURE

## 2017-06-13 DIAGNOSIS — H348312 Tributary (branch) retinal vein occlusion, right eye, stable: Secondary | ICD-10-CM | POA: Diagnosis not present

## 2017-06-13 DIAGNOSIS — H35033 Hypertensive retinopathy, bilateral: Secondary | ICD-10-CM | POA: Diagnosis not present

## 2017-06-13 DIAGNOSIS — H353122 Nonexudative age-related macular degeneration, left eye, intermediate dry stage: Secondary | ICD-10-CM | POA: Diagnosis not present

## 2017-06-13 DIAGNOSIS — H40013 Open angle with borderline findings, low risk, bilateral: Secondary | ICD-10-CM | POA: Diagnosis not present

## 2017-07-07 DIAGNOSIS — I10 Essential (primary) hypertension: Secondary | ICD-10-CM | POA: Diagnosis not present

## 2017-07-07 DIAGNOSIS — G8929 Other chronic pain: Secondary | ICD-10-CM | POA: Diagnosis not present

## 2017-07-07 DIAGNOSIS — M545 Low back pain: Secondary | ICD-10-CM | POA: Diagnosis not present

## 2017-07-07 DIAGNOSIS — Z6825 Body mass index (BMI) 25.0-25.9, adult: Secondary | ICD-10-CM | POA: Diagnosis not present

## 2017-07-15 DIAGNOSIS — M4726 Other spondylosis with radiculopathy, lumbar region: Secondary | ICD-10-CM | POA: Diagnosis not present

## 2017-07-15 DIAGNOSIS — M5416 Radiculopathy, lumbar region: Secondary | ICD-10-CM | POA: Diagnosis not present

## 2017-07-27 DIAGNOSIS — M1711 Unilateral primary osteoarthritis, right knee: Secondary | ICD-10-CM | POA: Diagnosis not present

## 2017-07-27 DIAGNOSIS — M1712 Unilateral primary osteoarthritis, left knee: Secondary | ICD-10-CM | POA: Diagnosis not present

## 2017-08-03 DIAGNOSIS — M1712 Unilateral primary osteoarthritis, left knee: Secondary | ICD-10-CM | POA: Diagnosis not present

## 2017-08-10 DIAGNOSIS — M1712 Unilateral primary osteoarthritis, left knee: Secondary | ICD-10-CM | POA: Diagnosis not present

## 2017-08-12 ENCOUNTER — Emergency Department (INDEPENDENT_AMBULATORY_CARE_PROVIDER_SITE_OTHER)
Admission: EM | Admit: 2017-08-12 | Discharge: 2017-08-12 | Disposition: A | Discharge status: Home / Self Care | Payer: Medicare Other | Source: Home / Self Care | Attending: Family Medicine | Admitting: Family Medicine

## 2017-08-12 ENCOUNTER — Ambulatory Visit (HOSPITAL_BASED_OUTPATIENT_CLINIC_OR_DEPARTMENT_OTHER)
Admission: RE | Admit: 2017-08-12 | Discharge: 2017-08-12 | Disposition: A | Discharge status: Home / Self Care | Payer: Medicare Other | Source: Ambulatory Visit | Attending: Family Medicine | Admitting: Family Medicine

## 2017-08-12 ENCOUNTER — Encounter: Payer: Self-pay | Admitting: *Deleted

## 2017-08-12 DIAGNOSIS — M79604 Pain in right leg: Secondary | ICD-10-CM | POA: Insufficient documentation

## 2017-08-12 DIAGNOSIS — M7989 Other specified soft tissue disorders: Secondary | ICD-10-CM | POA: Diagnosis not present

## 2017-08-12 DIAGNOSIS — M25561 Pain in right knee: Secondary | ICD-10-CM

## 2017-08-12 DIAGNOSIS — M79661 Pain in right lower leg: Secondary | ICD-10-CM | POA: Diagnosis not present

## 2017-08-12 NOTE — ED Provider Notes (Signed)
[name]    CSN: [id number] Arrival date & time: 08/12/17  1710     History   Chief Complaint Chief Complaint  Patient presents with  . Knee Pain    HPI Erin [name] is a 82 y.o. female.   HPI  Erin [name] is a 82 y.o. female presenting to UC with c/o 1-2 days of pain behind her Right knee that initially started in her Right mid calf.  Pain is aching and sore, 10/10 at worst after stepping off a step-stool earlier this evening but no specific known injury. Denies swelling, redness, bruising, or warmth.  No prior hx of blood clots. Denies chest pain or SOB.  She does report having similar pain in Left achilles for several months that eventually resolved on its own.    Past Medical History:  Diagnosis Date  . Hypertension   . Low back pain   . OA (osteoarthritis)   . Osteopenia     Patient Active Problem List   Diagnosis Date Noted  . Leg cramps 03/24/2017  . Achilles tendinitis 09/22/2016  . Fatigue 09/22/2016  . Skin infection 10/13/2015  . Paresthesia of both feet 10/09/2015  . RLS (restless legs syndrome) 10/09/2015  . URI, acute 11/30/2013  . Chest pain 11/30/2013  . CVA (cerebral vascular accident) ([place]) 09/19/2012  . Hematuria, gross 10/27/2011  . Obesity 09/09/2011  . Contusion of knee, left 01/12/2011  . Cystitis 05/21/2010  . NECK PAIN 05/21/2010  . Edema 02/12/2009  . WEIGHT GAIN 02/12/2009  . CRAMPS,LEG 11/11/2008  . BOILS, RECURRENT 02/01/2008  . HEARING IMPAIRMENT 01/25/2008  . RASH AND OTHER NONSPECIFIC SKIN ERUPTION 11/10/2007  . Essential hypertension 10/21/2007  . OSTEOARTHRITIS 10/21/2007  . LOW BACK PAIN 10/21/2007  . OSTEOPENIA 10/21/2007  . Gout 10/20/2007  . LEG PAIN 10/20/2007    Past Surgical History:  Procedure Laterality Date  . LUMBAR LAMINECTOMY      OB History    No data available       Home Medications    Prior to Admission medications   Medication Sig Start Date End Date Taking? Authorizing  Provider  carvedilol (COREG) 25 MG tablet TAKE ONE TABLET BY MOUTH TWICE DAILY WITH MEALS 11/02/16   Plotnikov, Evie Lacks, MD  Cholecalciferol (VITAMIN D3) 1000 UNITS tablet Take 1,000 Units by mouth daily.      [provider]  gabapentin (NEURONTIN) 100 MG capsule Take 1 capsule (100 mg total) by mouth 3 (three) times daily. For back and legs symptoms 10/09/15   Plotnikov, Evie Lacks, MD  loratadine (CLARITIN) 10 MG tablet TAKE ONE TABLET BY MOUTH EVERY DAY AS NEEDED FOR ALLERGY 09/22/16   Plotnikov, Evie Lacks, MD  meloxicam (MOBIC) 7.5 MG tablet Take 1 tablet (7.5 mg total) by mouth daily as needed for pain. 03/24/17   Plotnikov, Evie Lacks, MD  ondansetron (ZOFRAN-ODT) 4 MG disintegrating tablet Take 1 tablet (4 mg total) by mouth every 8 (eight) hours as needed for nausea. 05/13/17   Elby Beck, FNP  potassium chloride (KLOR-CON 10) 10 MEQ tablet Take 1 tablet (10 mEq total) by mouth daily. With Lasix 03/24/17   Plotnikov, Evie Lacks, MD  tretinoin (RETIN-A) 0.1 % cream Apply topically at bedtime. 02/10/17   Plotnikov, Evie Lacks, MD    Family History Family History  Problem Relation Age of Onset  . Hypertension Mother   . Hypertension Other     Social History Social History  Substance Use Topics  .  Smoking status: Former Research scientist (life sciences)  . Smokeless tobacco: Never Used     Comment: As a teenager  . Alcohol use No     Allergies   Barbiturates; Demerol; Furosemide; Hydrochlorothiazide; Irbesartan; Losartan potassium; Olmesartan medoxomil; and Telmisartan   Review of Systems Review of Systems  Respiratory: Negative for chest tightness, shortness of breath and wheezing.   Musculoskeletal: Positive for arthralgias ( posterior). Negative for joint swelling and myalgias.  Skin: Negative for color change and wound.     Physical Exam Triage Vital Signs ED Triage Vitals [08/12/17 1732]  Enc Vitals Group     BP (!) 182/78     Pulse Rate 68     Resp 16     Temp 97.8 F (36.6  C)     Temp Source Oral     SpO2 97 %     Weight 139 lb (63 kg)     Height 5\' 3"  (1.6 m)     Head Circumference      Peak Flow      Pain Score 10     Pain Loc      Pain Edu?      Excl. in [place]?    No data found.   Updated Vital Signs BP (!) 182/78 (BP Location: Left Arm)   Pulse 68   Temp 97.8 F (36.6 C) (Oral)   Resp 16   Ht 5\' 3"  (1.6 m)   Wt 139 lb (63 kg)   SpO2 97%   BMI 24.62 kg/m   Visual Acuity Right Eye Distance:   Left Eye Distance:   Bilateral Distance:    Right Eye Near:   Left Eye Near:    Bilateral Near:     Physical Exam  Constitutional: She is oriented to person, place, and time. She appears well-developed and well-nourished. No distress.  HENT:  Head: Normocephalic and atraumatic.  Eyes: EOM are normal.  Neck: Normal range of motion.  Cardiovascular: Normal rate.   Pulmonary/Chest: Effort normal.  Musculoskeletal: Normal range of motion. She exhibits tenderness. She exhibits no edema.  Right knee: posterior- tender, no edema. Full ROM. Calf is soft, non-tender.   Neurological: She is alert and oriented to person, place, and time.  Skin: Skin is warm and dry. No rash noted. She is not diaphoretic. No erythema.  Right knee: skin in tact. No ecchymosis or erythema.   Psychiatric: She has a normal mood and affect. Her behavior is normal.  Nursing note and vitals reviewed.    UC Treatments / Results  Labs (all labs ordered are listed, but only abnormal results are displayed) Labs Reviewed - No data to display  EKG  EKG Interpretation None       Radiology US Venous Img Lower Unilateral Right  Result Date: 08/12/2017 CLINICAL DATA:  Right posterior calf pain and swelling x2 days. EXAM: RIGHT LOWER EXTREMITY VENOUS DOPPLER ULTRASOUND TECHNIQUE: Gray-scale sonography with graded compression, as well as color Doppler and duplex ultrasound were performed to evaluate the lower extremity deep venous systems from the level of the common femoral  vein and including the common femoral, femoral, profunda femoral, popliteal and calf veins including the posterior tibial, peroneal and gastrocnemius veins when visible. The superficial great saphenous vein was also interrogated. Spectral Doppler was utilized to evaluate flow at rest and with distal augmentation maneuvers in the common femoral, femoral and popliteal veins. COMPARISON:  None. FINDINGS: Contralateral Common Femoral Vein: Respiratory phasicity is normal and symmetric with the symptomatic side. No  evidence of thrombus. Normal compressibility. Common Femoral Vein: No evidence of thrombus. Normal compressibility, respiratory phasicity and response to augmentation. Saphenofemoral Junction: No evidence of thrombus. Normal compressibility and flow on color Doppler imaging. Profunda Femoral Vein: No evidence of thrombus. Normal compressibility and flow on color Doppler imaging. Femoral Vein: No evidence of thrombus. Normal compressibility, respiratory phasicity and response to augmentation. Popliteal Vein: No evidence of thrombus. Normal compressibility, respiratory phasicity and response to augmentation. Calf Veins: No evidence of thrombus. Normal compressibility and flow on color Doppler imaging. Superficial Great Saphenous Vein: No evidence of thrombus. Normal compressibility. Venous Reflux:  None. Other Findings:  None. IMPRESSION: No evidence of deep venous thrombosis. Electronically Signed   By: Ashley Royalty M.D.   On: 08/12/2017 19:42    Procedures Procedures (including critical care time)  Medications Ordered in UC Medications - No data to display   Initial Impression / Assessment and Plan / UC Course  I have reviewed the triage vital signs and the nursing notes.  Pertinent labs & imaging results that were available during my care of the patient were reviewed by me and considered in my medical decision making (see chart for details).     Pt c/o posterior knee pain w/o known injury. Pt  is concerned about blood clot but denies prior hx of clots. No recent travel or surgery.  Pt sent to [facility] for U/S of Right lower leg.   U/S negative for DVT. Symptoms likely due to muscle or tendon strain. Discussed results with pt over phone while she was still at [facility]. Encouraged alternating cool and warm compresses and alternating acetaminophen and ibuprofen F/u with PCP in 1 week if needed.   Final Clinical Impressions(s) / UC Diagnoses   Final diagnoses:  Posterior knee pain, right    New Prescriptions Discharge Medication List as of 08/12/2017  5:39 PM       Controlled Substance Prescriptions [place] Controlled Substance Registry consulted? Not Applicable   Tyrell Antonio 08/12/17 1950

## 2017-08-12 NOTE — ED Triage Notes (Signed)
Pt c/o pain behind her RT knee x 1 day. Denies injury, swelling, redness, or heat. Last dose IBF 1400 today.

## 2017-09-22 DIAGNOSIS — M79675 Pain in left toe(s): Secondary | ICD-10-CM | POA: Diagnosis not present

## 2017-09-22 DIAGNOSIS — M67972 Unspecified disorder of synovium and tendon, left ankle and foot: Secondary | ICD-10-CM | POA: Diagnosis not present

## 2017-10-02 ENCOUNTER — Other Ambulatory Visit: Payer: Self-pay | Admitting: Internal Medicine

## 2017-10-07 DIAGNOSIS — M170 Bilateral primary osteoarthritis of knee: Secondary | ICD-10-CM | POA: Diagnosis not present

## 2017-11-01 ENCOUNTER — Ambulatory Visit: Payer: Medicare Other | Admitting: Podiatry

## 2017-12-27 ENCOUNTER — Encounter: Payer: Self-pay | Admitting: Family Medicine

## 2017-12-27 ENCOUNTER — Ambulatory Visit (INDEPENDENT_AMBULATORY_CARE_PROVIDER_SITE_OTHER): Payer: Medicare Other | Admitting: Family Medicine

## 2017-12-27 VITALS — BP 125/66 | HR 87 | Temp 97.7°F | Ht 63.0 in | Wt 134.0 lb

## 2017-12-27 DIAGNOSIS — R252 Cramp and spasm: Secondary | ICD-10-CM

## 2017-12-27 MED ORDER — CARVEDILOL 25 MG PO TABS: 25.0000 mg | ORAL_TABLET | Freq: Two times a day (BID) | ORAL | 0 refills | Status: DC

## 2017-12-27 NOTE — Progress Notes (Signed)
Erin [name] - 83 y.o. female MRN [id number]  Date of birth: [birth year]  SUBJECTIVE:  Including CC & ROS.  Chief Complaint  Patient presents with  . Leg cramps    Erin [name] is a 83 y.o. female that is presenting with leg cramps. She states she noticed the leg cramps been increasing over the past month Cramps in bilateral legs, occurs at night Located at her calf. She thinks it has to do with her blood pressure medication. She has been taking Coreg daily and noticed the cramps after taking it. Denies dizzness or headaches. She has been changing the way she takes the Coreg. She will take the medication just in the morning and seems not to have any cramps at that time.   Review of Systems  Constitutional: Negative for fever.  Respiratory: Negative for cough.   Cardiovascular: Negative for chest pain.  Neurological: Negative for weakness.  Hematological: Negative for adenopathy.    HISTORY: Past Medical, Surgical, Social, and Family History Reviewed & Updated per EMR.   Pertinent Historical Findings include:  Past Medical History:  Diagnosis Date  . Hypertension   . Low back pain   . OA (osteoarthritis)   . Osteopenia     Past Surgical History:  Procedure Laterality Date  . LUMBAR LAMINECTOMY      Allergies  Allergen Reactions  . Barbiturates   . Demerol Other (See Comments)    sick  . Furosemide   . Hydrochlorothiazide   . Irbesartan     REACTION: HA  . Losartan Potassium     REACTION: hair loss  . Olmesartan Medoxomil     REACTION: hair loss  . Telmisartan     REACTION: cramps    Family History  Problem Relation Age of Onset  . Hypertension Mother   . Hypertension Other      Social History   Socioeconomic History  . Marital status: Married    Spouse name: Not on file  . Number of children: Not on file  . Years of education: Not on file  . Highest education level: Not on file  Social Needs  . Financial resource strain: Not on file  . Food  insecurity - worry: Not on file  . Food insecurity - inability: Not on file  . Transportation needs - medical: Not on file  . Transportation needs - non-medical: Not on file  Occupational History  . Not on file  Tobacco Use  . Smoking status: Former Research scientist (life sciences)  . Smokeless tobacco: Never Used  . Tobacco comment: As a teenager  Substance and Sexual Activity  . Alcohol use: No  . Drug use: Not on file  . Sexual activity: No  Other Topics Concern  . Not on file  Social History Narrative  . Not on file     PHYSICAL EXAM:  VS: BP 125/66 (BP Location: Left Arm, Patient Position: Sitting, Cuff Size: Normal)   Pulse 87   Temp 97.7 F (36.5 C) (Oral)   Ht 5\' 3"  (1.6 m)   Wt 134 lb (60.8 kg)   SpO2 97%   BMI 23.74 kg/m  Physical Exam Gen: NAD, alert, cooperative with exam, well-appearing ENT: normal lips, normal nasal mucosa,  Eye: normal EOM, normal conjunctiva and lids CV:  no edema, +2 pedal pulses, S1-S2, regular rhythm   Resp: no accessory muscle use, non-labored,  Skin: no rashes, no areas of induration  Neuro: normal tone, normal sensation to touch Psych:  normal insight, alert and  oriented MSK: Normal appearing lower extremities,  normal flexion and extension of the knees. Normal plantar dorsiflexion of the ankles. Normal gait. Neurovascular intact     ASSESSMENT & PLAN:   No problem-specific Assessment & Plan notes found for this encounter.

## 2017-12-27 NOTE — Patient Instructions (Signed)
I have refilled your coreg.  Please follow up if your symptoms seem to worsen.

## 2017-12-27 NOTE — Assessment & Plan Note (Addendum)
Magnesium was normal 4 years ago. Does have a lower potassium at times. Possible that this could be related to electrolytes. She is currently only taking Coreg in the morning and seems to have improvement with her symptoms at night. unclear if this is related or not. - Refilled Coreg until she has follow-up with her primary doctor - She deferred any further workup or testing today.

## 2018-01-11 ENCOUNTER — Ambulatory Visit (INDEPENDENT_AMBULATORY_CARE_PROVIDER_SITE_OTHER): Payer: Medicare Other | Admitting: Internal Medicine

## 2018-01-11 ENCOUNTER — Encounter: Payer: Self-pay | Admitting: Internal Medicine

## 2018-01-11 ENCOUNTER — Other Ambulatory Visit: Payer: Self-pay | Admitting: Internal Medicine

## 2018-01-11 DIAGNOSIS — M7662 Achilles tendinitis, left leg: Secondary | ICD-10-CM

## 2018-01-11 DIAGNOSIS — I10 Essential (primary) hypertension: Secondary | ICD-10-CM | POA: Diagnosis not present

## 2018-01-11 MED ORDER — LORATADINE 10 MG PO TABS: ORAL_TABLET | ORAL | 3 refills | Status: DC

## 2018-01-11 MED ORDER — NITROGLYCERIN 0.2 MG/HR TD PT24: MEDICATED_PATCH | TRANSDERMAL | 12 refills | Status: DC

## 2018-01-11 MED ORDER — TRETINOIN 0.1 % EX CREA: TOPICAL_CREAM | Freq: Every day | CUTANEOUS | 3 refills | Status: DC

## 2018-01-11 MED ORDER — CARVEDILOL 25 MG PO TABS: 25.0000 mg | ORAL_TABLET | Freq: Two times a day (BID) | ORAL | 3 refills | Status: DC

## 2018-01-11 MED ORDER — NITROGLYCERIN 0.2 MG/HR TD PT24: 0.2000 mg | MEDICATED_PATCH | Freq: Every day | TRANSDERMAL | 12 refills | Status: DC

## 2018-01-11 MED ORDER — POTASSIUM CHLORIDE ER 10 MEQ PO TBCR: 10.0000 meq | EXTENDED_RELEASE_TABLET | Freq: Every day | ORAL | 3 refills | Status: DC

## 2018-01-11 MED ORDER — FUROSEMIDE 20 MG PO TABS: 20.0000 mg | ORAL_TABLET | Freq: Every day | ORAL | 2 refills | Status: DC | PRN

## 2018-01-11 NOTE — Progress Notes (Signed)
Subjective:  Patient ID: Erin [name], female    DOB: 1935-02-19  Age: 83 y.o. MRN: [id number]  CC: No chief complaint on file.   HPI Erin [name] presents for HTN, achilles tendonitis on the L x 1 year, allergies f/u. She saw Dr Doran Durand - NTG patch  Outpatient Medications Prior to Visit  Medication Sig Dispense Refill  . carvedilol (COREG) 25 MG tablet Take 1 tablet (25 mg total) by mouth 2 (two) times daily with a meal. 45 tablet 0  . Cholecalciferol (VITAMIN D3) 1000 UNITS tablet Take 1,000 Units by mouth daily.      Marland Kitchen ibuprofen (ADVIL,MOTRIN) 800 MG tablet TAKE ONE TABLET BY MOUTH THREE TIMES DAILY WITH MEALS 60 tablet 5  . loratadine (CLARITIN) 10 MG tablet TAKE ONE TABLET BY MOUTH EVERY DAY AS NEEDED FOR ALLERGY 90 tablet 3  . meloxicam (MOBIC) 7.5 MG tablet Take 1 tablet (7.5 mg total) by mouth daily as needed for pain. 30 tablet 1  . potassium chloride (KLOR-CON 10) 10 MEQ tablet Take 1 tablet (10 mEq total) by mouth daily. With Lasix 90 tablet 3  . tretinoin (RETIN-A) 0.1 % cream Apply topically at bedtime. 45 g 3   No facility-administered medications prior to visit.     ROS Review of Systems  Constitutional: Negative for activity change, appetite change, chills, fatigue and unexpected weight change.  HENT: Negative for congestion, mouth sores and sinus pressure.   Eyes: Negative for visual disturbance.  Respiratory: Negative for cough and chest tightness.   Gastrointestinal: Negative for abdominal pain and nausea.  Genitourinary: Negative for difficulty urinating, frequency and vaginal pain.  Musculoskeletal: Positive for arthralgias and gait problem. Negative for back pain.  Skin: Negative for pallor and rash.  Neurological: Negative for dizziness, tremors, weakness, numbness and headaches.  Psychiatric/Behavioral: Negative for confusion and sleep disturbance.    Objective:  There were no vitals taken for this visit.  BP Readings from Last 3 Encounters:    12/27/17 125/66  08/12/17 (!) 182/78  05/13/17 (!) 200/88    Wt Readings from Last 3 Encounters:  12/27/17 134 lb (60.8 kg)  08/12/17 139 lb (63 kg)  05/13/17 141 lb (64 kg)    Physical Exam  Constitutional: She appears well-developed. No distress.  HENT:  Head: Normocephalic.  Right Ear: External ear normal.  Left Ear: External ear normal.  Nose: Nose normal.  Mouth/Throat: Oropharynx is clear and moist.  Eyes: Conjunctivae are normal. Pupils are equal, round, and reactive to light. Right eye exhibits no discharge. Left eye exhibits no discharge.  Neck: Normal range of motion. Neck supple. No JVD present. No tracheal deviation present. No thyromegaly present.  Cardiovascular: Normal rate, regular rhythm and normal heart sounds.  Pulmonary/Chest: No stridor. No respiratory distress. She has no wheezes.  Abdominal: Soft. Bowel sounds are normal. She exhibits no distension and no mass. There is no tenderness. There is no rebound and no guarding.  Musculoskeletal: She exhibits tenderness. She exhibits no edema.  Lymphadenopathy:    She has no cervical adenopathy.  Neurological: She displays normal reflexes. No cranial nerve deficit. She exhibits normal muscle tone. Coordination normal.  Skin: No rash noted. No erythema.  Psychiatric: She has a normal mood and affect. Her behavior is normal. Judgment and thought content normal.  L achilles tendon is tender  Lab Results  Component Value Date   WBC 7.4 09/22/2016   HGB 13.7 09/22/2016   HCT 41.3 09/22/2016   PLT 265.0 09/22/2016  GLUCOSE 106 (H) 03/24/2017   CHOL 149 09/19/2012   TRIG 18.0 09/19/2012   HDL 91.40 09/19/2012   LDLCALC 54 09/19/2012   ALT 15 09/22/2016   AST 19 09/22/2016   NA 142 03/24/2017   K 4.6 03/24/2017   CL 104 03/24/2017   CREATININE 0.70 03/24/2017   BUN 25 (H) 03/24/2017   CO2 30 03/24/2017   TSH 2.49 09/22/2016    US Venous Img Lower Unilateral Right  Result Date: 08/12/2017 CLINICAL  DATA:  Right posterior calf pain and swelling x2 days. EXAM: RIGHT LOWER EXTREMITY VENOUS DOPPLER ULTRASOUND TECHNIQUE: Gray-scale sonography with graded compression, as well as color Doppler and duplex ultrasound were performed to evaluate the lower extremity deep venous systems from the level of the common femoral vein and including the common femoral, femoral, profunda femoral, popliteal and calf veins including the posterior tibial, peroneal and gastrocnemius veins when visible. The superficial great saphenous vein was also interrogated. Spectral Doppler was utilized to evaluate flow at rest and with distal augmentation maneuvers in the common femoral, femoral and popliteal veins. COMPARISON:  None. FINDINGS: Contralateral Common Femoral Vein: Respiratory phasicity is normal and symmetric with the symptomatic side. No evidence of thrombus. Normal compressibility. Common Femoral Vein: No evidence of thrombus. Normal compressibility, respiratory phasicity and response to augmentation. Saphenofemoral Junction: No evidence of thrombus. Normal compressibility and flow on color Doppler imaging. Profunda Femoral Vein: No evidence of thrombus. Normal compressibility and flow on color Doppler imaging. Femoral Vein: No evidence of thrombus. Normal compressibility, respiratory phasicity and response to augmentation. Popliteal Vein: No evidence of thrombus. Normal compressibility, respiratory phasicity and response to augmentation. Calf Veins: No evidence of thrombus. Normal compressibility and flow on color Doppler imaging. Superficial Great Saphenous Vein: No evidence of thrombus. Normal compressibility. Venous Reflux:  None. Other Findings:  None. IMPRESSION: No evidence of deep venous thrombosis. Electronically Signed   By: Ashley Royalty M.D.   On: 08/12/2017 19:42    Assessment & Plan:   There are no diagnoses linked to this encounter. I am having Erin [name] maintain her cholecalciferol, loratadine,  tretinoin, potassium chloride, meloxicam, ibuprofen, and carvedilol.  No orders of the defined types were placed in this encounter.    Follow-up: No Follow-up on file.  Walker Kehr, MD

## 2018-01-11 NOTE — Assessment & Plan Note (Signed)
Coreg

## 2018-01-11 NOTE — Assessment & Plan Note (Signed)
Furosemide prn 

## 2018-01-11 NOTE — Assessment & Plan Note (Addendum)
  Meloxicam PRN, NTG patch Ice, massage 1/4" heel lift Sports Med ref offered - needs a surg boot?

## 2018-01-11 NOTE — Patient Instructions (Signed)
Ice, massage 1/4" heel lift Achilles Tendinitis Rehab Ask your health care provider which exercises are safe for you. Do exercises exactly as told by your health care provider and adjust them as directed. It is normal to feel mild stretching, pulling, tightness, or discomfort as you do these exercises, but you should stop right away if you feel sudden pain or your pain gets worse. Do not begin these exercises until told by your health care provider. Stretching and range of motion exercises These exercises warm up your muscles and joints and improve the movement and flexibility of your ankle. These exercises also help to relieve pain, numbness, and tingling. Exercise A: Standing wall calf stretch, knee straight  1. Stand with your hands against a wall. 2. Extend your __________ leg behind you and bend your front knee slightly. Keep both of your heels on the floor. 3. Point the toes of your back foot slightly inward. 4. Keeping your heels on the floor and your back knee straight, shift your weight toward the wall. Do not allow your back to arch. You should feel a gentle stretch in your calf. 5. Hold this position for seconds. Repeat __________ times. Complete this stretch __________ times per day. Exercise B: Standing wall calf stretch, knee bent 1. Stand with your hands against a wall. 2. Extend your __________ leg behind you, and bend your front knee slightly. Keep both of your heels on the floor. 3. Point the toes of your back foot slightly inward. 4. Keeping your heels on the floor, unlock your back knee so that it is bent. You should feel a gentle stretch deep in your calf. 5. Hold this position for __________ seconds. Repeat __________ times. Complete this stretch __________ times per day. Strengthening exercises These exercises build strength and control of your ankle. Endurance is the ability to use your muscles for a long time, even after they get tired. Exercise C: Plantar flexion with  band  1. Sit on the floor with your __________ leg extended. You may put a pillow under your calf to give your foot more room to move. 2. Loop a rubber exercise band or tube around the ball of your __________ foot. The ball of your foot is on the walking surface, right under your toes. The band or tube should be slightly tense when your foot is relaxed. If the band or tube slips, you can put on your shoe or put a washcloth between the band and your foot to help it stay in place. 3. Slowly point your toes downward, pushing them away from you. 4. Hold this position for __________ seconds. 5. Slowly release the tension in the band or tube, controlling smoothly until your foot is back to the starting position. Repeat __________ times. Complete this exercise __________ times per day. Exercise D: Heel raise with eccentric lower  1. Stand on a step with the balls of your feet. The ball of your foot is on the walking surface, right under your toes. ? Do not put your heels on the step. ? For balance, rest your hands on the wall or on a railing. 2. Rise up onto the balls of your feet. 3. Keeping your heels up, shift all of your weight to your __________ leg and pick up your other leg. 4. Slowly lower your __________ leg so your heel drops below the level of the step. 5. Put down your foot. If told by your health care provider, build up to:  3 sets of 15  repetitions while keeping your knees straight.  3 sets of 15 repetitions while keeping your knees bent as far as told by your health care provider.  Complete this exercise __________ times per day. If this exercise is too easy, try doing it while wearing a backpack with weights in it. Balance exercises These exercises improve or maintain your balance. Balance is important in preventing falls. Exercise E: Single leg stand 1. Without shoes, stand near a railing or in a door frame. Hold on to the railing or door frame as needed. 2. Stand on your  __________ foot. Keep your big toe down on the floor and try to keep your arch lifted. 3. Hold this position for __________ seconds. Repeat __________ times. Complete this exercise __________ times per day. If this exercise is too easy, you can try it with your eyes closed or while standing on a pillow. This information is not intended to replace advice given to you by your health care provider. Make sure you discuss any questions you have with your health care provider. Document Released: 05/12/2005 Document Revised: 06/17/2016 Document Reviewed: 06/17/2015 Elsevier Interactive Patient Education  2018 [employer].  Achilles Tendinitis Achilles tendinitis is inflammation of the tough, cord-like band that attaches the lower leg muscles to the heel bone (Achilles tendon). This is usually caused by overusing the tendon and the ankle joint. Achilles tendinitis usually gets better over time with treatment and caring for yourself at home. It can take weeks or months to heal completely. What are the causes? This condition may be caused by:  A sudden increase in exercise or activity, such as running.  Doing the same exercises or activities (such as jumping) over and over.  Not warming up calf muscles before exercising.  Exercising in shoes that are worn out or not made for exercise.  Having arthritis or a bone growth (spur) on the back of the heel bone. This can rub against the tendon and hurt it.  Age-related wear and tear. Tendons become less flexible with age and more likely to be injured.  What are the signs or symptoms? Common symptoms of this condition include:  Pain in the Achilles tendon or in the back of the leg, just above the heel. The pain usually gets worse with exercise.  Stiffness or soreness in the back of the leg, especially in the morning.  Swelling of the skin over the Achilles tendon.  Thickening of the tendon.  Bone spurs at the bottom of the Achilles tendon, near  the heel.  Trouble standing on tiptoe.  How is this diagnosed? This condition is diagnosed based on your symptoms and a physical exam. You may have tests, including:  X-rays.  MRI.  How is this treated? The goal of treatment is to relieve symptoms and help your injury heal. Treatment may include:  Decreasing or stopping activities that caused the tendinitis. This may mean switching to low-impact exercises like biking or swimming.  Icing the injured area.  Doing physical therapy, including strengthening and stretching exercises.  NSAIDs to help relieve pain and swelling.  Using supportive shoes, wraps, heel lifts, or a walking boot (air cast).  Surgery. This may be done if your symptoms do not improve after 6 months.  Using high-energy shock wave impulses to stimulate the healing process (extracorporeal shock wave therapy). This is rare.  Injection of medicines to help relieve inflammation (corticosteroids). This is rare.  Follow these instructions at home: If you have an air cast:  Wear the  cast as told by your health care provider. Remove it only as told by your health care provider.  Loosen the cast if your toes tingle, become numb, or turn cold and blue. Activity  Gradually return to your normal activities once your health care provider approves. Do not do activities that cause pain. ? Consider doing low-impact exercises, like cycling or swimming.  If you have an air cast, ask your health care provider when it is safe for you to drive.  If physical therapy was prescribed, do exercises as told by your health care provider or physical therapist. Managing pain, stiffness, and swelling  Raise (elevate) your foot above the level of your heart while you are sitting or lying down.  Move your toes often to avoid stiffness and to lessen swelling.  If directed, put ice on the injured area: ? Put ice in a plastic bag. ? Place a towel between your skin and the bag. ? Leave  the ice on for 20 minutes, 2-3 times a day General instructions  If directed, wrap your foot with an elastic bandage or other wrap. This can help keep your tendon from moving too much while it heals. Your health care provider will show you how to wrap your foot correctly.  Wear supportive shoes or heel lifts only as told by your health care provider.  Take over-the-counter and prescription medicines only as told by your health care provider.  Keep all follow-up visits as told by your health care provider. This is important. Contact a health care provider if:  You have symptoms that gets worse.  You have pain that does not get better with medicine.  You develop new, unexplained symptoms.  You develop warmth and swelling in your foot.  You have a fever. Get help right away if:  You have a sudden popping sound or sensation in your Achilles tendon followed by severe pain.  You cannot move your toes or foot.  You cannot put any weight on your foot. Summary  Achilles tendinitis is inflammation of the tough, cord-like band that attaches the lower leg muscles to the heel bone (Achilles tendon).  This condition is usually caused by overusing the tendon and the ankle joint. It can also be caused by arthritis or normal aging.  The most common symptoms of this condition include pain, swelling, or stiffness in the Achilles tendon or in the back of the leg.  This condition is usually treated with rest, NSAIDs, and physical therapy. This information is not intended to replace advice given to you by your health care provider. Make sure you discuss any questions you have with your health care provider. Document Released: 07/21/2005 Document Revised: 08/30/2016 Document Reviewed: 08/30/2016 Elsevier Interactive Patient Education  2017 [employer].

## 2018-01-18 ENCOUNTER — Ambulatory Visit: Payer: Medicare Other | Admitting: Internal Medicine

## 2018-01-20 ENCOUNTER — Telehealth: Payer: Self-pay

## 2018-01-20 NOTE — Telephone Encounter (Signed)
Key: [id number]

## 2018-02-06 ENCOUNTER — Encounter (INDEPENDENT_AMBULATORY_CARE_PROVIDER_SITE_OTHER): Payer: Medicare Other | Admitting: Ophthalmology

## 2018-02-09 ENCOUNTER — Ambulatory Visit (INDEPENDENT_AMBULATORY_CARE_PROVIDER_SITE_OTHER): Payer: Medicare Other | Admitting: Family Medicine

## 2018-02-09 ENCOUNTER — Encounter: Payer: Self-pay | Admitting: Family Medicine

## 2018-02-09 VITALS — BP 122/64 | HR 58 | Temp 97.9°F | Ht 63.0 in | Wt 130.0 lb

## 2018-02-09 DIAGNOSIS — S86012A Strain of left Achilles tendon, initial encounter: Secondary | ICD-10-CM | POA: Diagnosis not present

## 2018-02-09 NOTE — Patient Instructions (Signed)
Please use the boot when you are up and about.  Please try to get a rolling walker and this needs to be used at anytime that you need to walk  Please follow up with me in one week.  Please take tylenol for pain Please use ice over the area.    Achilles Tendon Rupture The Achilles tendon is a cord-like band that connects the muscles of your lower leg (calf) to your heel. An Achilles tendon rupture is an injury that involves a tear in this tendon. This tendon is the most common site of tendon tearing. What are the causes? This condition may be caused by:  Stress from a sudden stretching of the tendon. For example, this may occur when you land from a jump or when your heel drops down into a hole on uneven ground.  A hard, direct hit to the tendon.  Pushing off your foot forcefully, such as when sprinting, jumping, or changing direction while running.  What increases the risk? This condition is more likely to develop in:  Runners.  People who play sports that involve sprinting, running, or jumping.  People who play contact sports.  People with a weak Achilles tendon. Tendons can weaken from aging, repeat injuries, and chronic tendinitis.  Males who are 56-45 years of age, especially those who do not exercise regularly.  What are the signs or symptoms? Symptoms of this condition include:  Hearing a "pop" at the time of injury.  Severe, sudden pain in the back of the ankle.  Swelling and bruising.  Inability to actively point your toes down.  Pain when standing or walking.  A feeling of giving way when you step on the affected side.  How is this diagnosed? This condition is usually diagnosed with a physical exam. During the exam, your health care provider may:  Touch the tendon and the structures around it.  Squeeze your calf to see if your foot moves.  Ask you to point and flex your foot.  Sometimes, tests are done in addition to an exam. Tests may include:  An  ultrasound.  An X-ray.  MRI.  How is this treated? This condition may be treated with:  Ice applied to the area.  Pain medicine.  Rest.  Crutches.  A cast, splint, or other device to keep the ankle from moving (keep it immobilized).  Heel wedges to reduce the stretch on your tendon as it heals.  Surgery. This option may depend on your age and your activity level.  Follow these instructions at home: If you have a splint or brace:  Do not put pressure on any part of the splint until it is fully hardened. This may take several hours.  Wear the splint or brace as told by your health care provider. Remove it only as told by your health care provider.  Loosen the splint or brace if your toes tingle, become numb, or turn cold and blue.  Do not let your splint or brace get wet if it is not waterproof.  Keep the splint or brace clean. If you have a cast:  Do not put pressure on any part of the cast until it is fully hardened. This may take several hours.  Do not stick anything inside the cast to scratch your skin. Doing that increases your risk of infection.  Check the skin around the cast every day. Tell your health care provider about any concerns.  You may put lotion on dry skin around the edges  of the cast. Do not put lotion on the skin underneath the cast.  Do not let your cast get wet if it is not waterproof.  Keep the cast clean. Bathing  Do not take baths, swim, or use a hot tub until your health care provider approves. Ask your health care provider if you can take showers. You may only be allowed to take sponge baths for bathing.  If your cast, splint, or brace is not waterproof, cover it with a watertight covering when you take a bath or a shower. Managing pain, stiffness, and swelling  If directed, apply ice to the injured area. ? Put ice in a plastic bag. ? Place a towel between your skin and the bag. ? Leave the ice on for 20 minutes, 2-3 times a  day.  Move your toes often to avoid stiffness and to lessen swelling.  Raise (elevate) the injured area above the level of your heart while you are sitting or lying down. Do not dangle your leg over a chair, couch, or bed. Driving  Do not drive or operate heavy machinery while taking prescription pain medicine.  Ask your health care provider when it is safe to drive if you have a cast, splint, or brace on a leg or foot that you use for driving. Activity  Return to your normal activities as told by your health care provider. Ask your health care provider what activities are safe for you.  Do exercises only as told by your health care provider. General instructions  Do not use the injured limb to support your body weight until your health care provider says that you can. Use crutches as told by your health care provider.  Do not use any tobacco products, such as cigarettes, chewing tobacco, and e-cigarettes. Tobacco can delay bone healing. If you need help quitting, ask your health care provider.  Take over-the-counter and prescription medicines only as told by your health care provider.  Keep all follow-up visits as told by your health care provider. This is important. How is this prevented?  Warm up and stretch before being active.  Cool down and stretch after being active.  Give your body time to rest between periods of activity.  Make sure to use equipment that fits you.  Be safe and responsible while being active to avoid falls.  Each week, do at least 150 minutes of moderate-intensity exercise, such as brisk walking or water aerobics.  Spread your workouts over the whole week, instead of just working out intensely one or two days of the week.  Make slow, incremental changes in intensity, distance, or time for running or sporting activity.  Maintain physical fitness, including: ? Strength. ? Flexibility. ? Cardiovascular fitness. ? Endurance. Contact a health care  provider if:  Your pain and swelling increase.  Your pain is not controlled with medicines.  You develop new, unexplained symptoms.  Your symptoms get worse.  You cannot move your toes or foot.  You develop warmth and swelling in your foot.  You have an unexplained fever. This information is not intended to replace advice given to you by your health care provider. Make sure you discuss any questions you have with your health care provider. Document Released: 10/11/2005 Document Revised: 06/15/2016 Document Reviewed: 09/03/2015 Elsevier Interactive Patient Education  [employer].

## 2018-02-09 NOTE — Progress Notes (Signed)
Erin [name] - 83 y.o. female MRN [id number]  Date of birth: 02-23-1935  SUBJECTIVE:  Including CC & ROS.  Chief Complaint  Patient presents with  . Achilles heel pain    Erin [name] is a 83 y.o. female that is presenting with left achilles pain. Pain is located in the midbelly of her achilles. Admits to bruising. Denies injury. She stood up and her foot gave out three days ago, denies falling. Pain is acute constant stabbing pain. Worsens when she ambulates. She has been applying Nitro patches and performing home exercises with no change in her pain.  She has been treated by a different doctor for Achilles tendinopathy for the past year.   Review of Systems  Constitutional: Negative for fever.  HENT: Negative for congestion.   Respiratory: Negative for cough.   Cardiovascular: Negative for chest pain.  Gastrointestinal: Negative for abdominal pain.  Musculoskeletal: Positive for gait problem.  Skin: Positive for color change.  Allergic/Immunologic: Negative for immunocompromised state.  Neurological: Positive for weakness.  Hematological: Negative for adenopathy.  Psychiatric/Behavioral: Negative for agitation.    HISTORY: Past Medical, Surgical, Social, and Family History Reviewed & Updated per EMR.   Pertinent Historical Findings include:  Past Medical History:  Diagnosis Date  . Hypertension   . Low back pain   . OA (osteoarthritis)   . Osteopenia     Past Surgical History:  Procedure Laterality Date  . LUMBAR LAMINECTOMY      Allergies  Allergen Reactions  . Barbiturates   . Demerol Other (See Comments)    sick  . Hydrochlorothiazide   . Irbesartan     REACTION: HA  . Losartan Potassium     REACTION: hair loss  . Olmesartan Medoxomil     REACTION: hair loss  . Telmisartan     REACTION: cramps    Family History  Problem Relation Age of Onset  . Hypertension Mother   . Hypertension Other      Social History   Socioeconomic History  . Marital  status: Married    Spouse name: Not on file  . Number of children: Not on file  . Years of education: Not on file  . Highest education level: Not on file  Occupational History  . Not on file  Social Needs  . Financial resource strain: Not on file  . Food insecurity:    Worry: Not on file    Inability: Not on file  . Transportation needs:    Medical: Not on file    Non-medical: Not on file  Tobacco Use  . Smoking status: Former Research scientist (life sciences)  . Smokeless tobacco: Never Used  . Tobacco comment: As a teenager  Substance and Sexual Activity  . Alcohol use: No  . Drug use: Not on file  . Sexual activity: Never  Lifestyle  . Physical activity:    Days per week: Not on file    Minutes per session: Not on file  . Stress: Not on file  Relationships  . Social connections:    Talks on phone: Not on file    Gets together: Not on file    Attends religious service: Not on file    Active member of club or organization: Not on file    Attends meetings of clubs or organizations: Not on file    Relationship status: Not on file  . Intimate partner violence:    Fear of current or ex partner: Not on file    Emotionally abused:  Not on file    Physically abused: Not on file    Forced sexual activity: Not on file  Other Topics Concern  . Not on file  Social History Narrative  . Not on file     PHYSICAL EXAM:  VS: BP 122/64 (BP Location: Left Arm, Patient Position: Sitting, Cuff Size: Normal)   Pulse (!) 58   Temp 97.9 F (36.6 C) (Oral)   Ht 5\' 3"  (1.6 m)   Wt 130 lb (59 kg)   SpO2 97%   BMI 23.03 kg/m  Physical Exam Gen: NAD, alert, cooperative with exam, well-appearing ENT: normal lips, normal nasal mucosa,  Eye: normal EOM, normal conjunctiva and lids CV:  no edema, +2 pedal pulses   Resp: no accessory muscle use, non-labored,  Skin: no rashes, no areas of induration  Neuro: normal tone, normal sensation to touch Psych:  normal insight, alert and oriented MSK:  Left foot:    Ecchymosis is apparent over the posterior calcaneus Tenderness to palpation of the mid belly of the Achilles No plantar flexion with Thompson test Neurovascularly intact  Limited ultrasound: left achilles:  Normal-appearing Achilles at the calcaneus. Disruption of the Achilles roughly 4 cm proximally.  Appears to have retraction to suggest a full thickness Achilles tear.  Summary: Full-thickness Achilles rupture  Ultrasound and interpretation by Clearance Coots, MD              ASSESSMENT & PLAN:   Achilles rupture, left Ruptured Achilles observed on ultrasound.  Will treat conservatively -Placed in cam walker today. -Provided prescription for kneeling rolling walker -Advised to follow-up in 1 week.

## 2018-02-10 DIAGNOSIS — S86012A Strain of left Achilles tendon, initial encounter: Secondary | ICD-10-CM | POA: Insufficient documentation

## 2018-02-10 NOTE — Assessment & Plan Note (Signed)
Ruptured Achilles observed on ultrasound.  Will treat conservatively -Placed in cam walker today. -Provided prescription for kneeling rolling walker -Advised to follow-up in 1 week.

## 2018-02-13 ENCOUNTER — Ambulatory Visit (INDEPENDENT_AMBULATORY_CARE_PROVIDER_SITE_OTHER): Payer: Medicare Other | Admitting: Ophthalmology

## 2018-02-16 ENCOUNTER — Encounter: Payer: Self-pay | Admitting: Family Medicine

## 2018-02-16 ENCOUNTER — Ambulatory Visit (INDEPENDENT_AMBULATORY_CARE_PROVIDER_SITE_OTHER): Payer: Medicare Other | Admitting: Family Medicine

## 2018-02-16 DIAGNOSIS — S86012D Strain of left Achilles tendon, subsequent encounter: Secondary | ICD-10-CM

## 2018-02-16 NOTE — Progress Notes (Signed)
Erin [name] - 83 y.o. female MRN [id number]  Date of birth: 1935-09-18  SUBJECTIVE:  Including CC & ROS.  Chief Complaint  Patient presents with  . Follow-up    Erin [name] is a 83 y.o. female that is here today for left achilles tendon rupture follow up. She has not been wearing the cam walker. She states when she wore the cam walker it caused a burning sensation. She has been keeping off of her feet. Admits to swelling.  She has weakness with plantar flexion.  She has been trying to stay off of her foot as much as she can.   Review of Systems  Constitutional: Negative for fever.  Cardiovascular: Negative for chest pain.  Gastrointestinal: Negative for abdominal pain.  Musculoskeletal: Positive for gait problem.  Hematological: Negative for adenopathy.  Psychiatric/Behavioral: Negative for agitation.    HISTORY: Past Medical, Surgical, Social, and Family History Reviewed & Updated per EMR.   Pertinent Historical Findings include:  Past Medical History:  Diagnosis Date  . Hypertension   . Low back pain   . OA (osteoarthritis)   . Osteopenia     Past Surgical History:  Procedure Laterality Date  . LUMBAR LAMINECTOMY      Allergies  Allergen Reactions  . Barbiturates   . Demerol Other (See Comments)    sick  . Hydrochlorothiazide   . Irbesartan     REACTION: HA  . Losartan Potassium     REACTION: hair loss  . Olmesartan Medoxomil     REACTION: hair loss  . Telmisartan     REACTION: cramps    Family History  Problem Relation Age of Onset  . Hypertension Mother   . Hypertension Other      Social History   Socioeconomic History  . Marital status: Married    Spouse name: Not on file  . Number of children: Not on file  . Years of education: Not on file  . Highest education level: Not on file  Occupational History  . Not on file  Social Needs  . Financial resource strain: Not on file  . Food insecurity:    Worry: Not on file    Inability: Not on  file  . Transportation needs:    Medical: Not on file    Non-medical: Not on file  Tobacco Use  . Smoking status: Former Research scientist (life sciences)  . Smokeless tobacco: Never Used  . Tobacco comment: As a teenager  Substance and Sexual Activity  . Alcohol use: No  . Drug use: Not on file  . Sexual activity: Never  Lifestyle  . Physical activity:    Days per week: Not on file    Minutes per session: Not on file  . Stress: Not on file  Relationships  . Social connections:    Talks on phone: Not on file    Gets together: Not on file    Attends religious service: Not on file    Active member of club or organization: Not on file    Attends meetings of clubs or organizations: Not on file    Relationship status: Not on file  . Intimate partner violence:    Fear of current or ex partner: Not on file    Emotionally abused: Not on file    Physically abused: Not on file    Forced sexual activity: Not on file  Other Topics Concern  . Not on file  Social History Narrative  . Not on file  PHYSICAL EXAM:  VS: BP 138/74 (BP Location: Left Arm, Patient Position: Sitting, Cuff Size: Normal)   Pulse 68   Temp 97.9 F (36.6 C) (Oral)   Ht 5\' 3"  (1.6 m)   SpO2 96%   BMI 23.03 kg/m  Physical Exam Gen: NAD, alert, cooperative with exam, well-appearing ENT: normal lips, normal nasal mucosa,  Eye: normal EOM, normal conjunctiva and lids CV:  no edema, +2 pedal pulses   Resp: no accessory muscle use, non-labored,   Skin: no rashes, no areas of induration  Neuro: normal tone, normal sensation to touch Psych:  normal insight, alert and oriented MSK:  Left ankle: Swelling at the mid substance portion of the Achilles. Thompson test with no plantarflexion. Neurovascularly intact  Limited ultrasound: Left Achilles:  Disruption of the mid substance of the Achilles to suggest a ongoing rupture  Summary: Ruptured Achilles tendon  Ultrasound and interpretation by Clearance Coots,  MD              ASSESSMENT & PLAN:   Achilles rupture, left Has not been wearing the CAM walker.  - crutches today. Reports the kneeling rolling walker is too big for her house  - provided info to get wool felt from [facility] as she reports burning when wearing the CAM walker  - advised to follow up in 2 weeks.

## 2018-02-16 NOTE — Assessment & Plan Note (Signed)
Has not been wearing the CAM walker.  - crutches today. Reports the kneeling rolling walker is too big for her house  - provided info to get wool felt from [facility] as she reports burning when wearing the CAM walker  - advised to follow up in 2 weeks.

## 2018-02-16 NOTE — Patient Instructions (Signed)
Please try to wear the boot Please use the crutches if the rolling walker is too big  Please go to the [facility] and obtain some wool padding  [id number] [place], [place] [address]   Please follow up with me in 2 weeks.

## 2018-03-13 ENCOUNTER — Encounter (INDEPENDENT_AMBULATORY_CARE_PROVIDER_SITE_OTHER): Payer: Medicare Other | Admitting: Ophthalmology

## 2018-03-13 DIAGNOSIS — H35033 Hypertensive retinopathy, bilateral: Secondary | ICD-10-CM | POA: Diagnosis not present

## 2018-03-13 DIAGNOSIS — H348312 Tributary (branch) retinal vein occlusion, right eye, stable: Secondary | ICD-10-CM

## 2018-03-13 DIAGNOSIS — H353121 Nonexudative age-related macular degeneration, left eye, early dry stage: Secondary | ICD-10-CM | POA: Diagnosis not present

## 2018-03-13 DIAGNOSIS — I10 Essential (primary) hypertension: Secondary | ICD-10-CM

## 2018-03-13 DIAGNOSIS — H43813 Vitreous degeneration, bilateral: Secondary | ICD-10-CM | POA: Diagnosis not present

## 2018-03-14 NOTE — Telephone Encounter (Signed)
PA approved though 01/20/19

## 2018-04-11 DIAGNOSIS — S86012A Strain of left Achilles tendon, initial encounter: Secondary | ICD-10-CM | POA: Diagnosis not present

## 2018-04-14 DIAGNOSIS — R269 Unspecified abnormalities of gait and mobility: Secondary | ICD-10-CM | POA: Diagnosis not present

## 2018-04-14 DIAGNOSIS — S86001D Unspecified injury of right Achilles tendon, subsequent encounter: Secondary | ICD-10-CM | POA: Diagnosis not present

## 2018-04-14 DIAGNOSIS — M25572 Pain in left ankle and joints of left foot: Secondary | ICD-10-CM | POA: Diagnosis not present

## 2018-04-17 DIAGNOSIS — R269 Unspecified abnormalities of gait and mobility: Secondary | ICD-10-CM | POA: Diagnosis not present

## 2018-04-17 DIAGNOSIS — M25572 Pain in left ankle and joints of left foot: Secondary | ICD-10-CM | POA: Diagnosis not present

## 2018-04-17 DIAGNOSIS — S86001D Unspecified injury of right Achilles tendon, subsequent encounter: Secondary | ICD-10-CM | POA: Diagnosis not present

## 2018-04-21 DIAGNOSIS — S86001D Unspecified injury of right Achilles tendon, subsequent encounter: Secondary | ICD-10-CM | POA: Diagnosis not present

## 2018-04-21 DIAGNOSIS — R269 Unspecified abnormalities of gait and mobility: Secondary | ICD-10-CM | POA: Diagnosis not present

## 2018-04-21 DIAGNOSIS — M25572 Pain in left ankle and joints of left foot: Secondary | ICD-10-CM | POA: Diagnosis not present

## 2018-04-24 DIAGNOSIS — M170 Bilateral primary osteoarthritis of knee: Secondary | ICD-10-CM | POA: Diagnosis not present

## 2018-04-24 DIAGNOSIS — M1712 Unilateral primary osteoarthritis, left knee: Secondary | ICD-10-CM | POA: Diagnosis not present

## 2018-04-24 DIAGNOSIS — M1711 Unilateral primary osteoarthritis, right knee: Secondary | ICD-10-CM | POA: Diagnosis not present

## 2018-04-25 DIAGNOSIS — S86001D Unspecified injury of right Achilles tendon, subsequent encounter: Secondary | ICD-10-CM | POA: Diagnosis not present

## 2018-04-25 DIAGNOSIS — M25572 Pain in left ankle and joints of left foot: Secondary | ICD-10-CM | POA: Diagnosis not present

## 2018-04-25 DIAGNOSIS — R269 Unspecified abnormalities of gait and mobility: Secondary | ICD-10-CM | POA: Diagnosis not present

## 2018-04-28 DIAGNOSIS — M25572 Pain in left ankle and joints of left foot: Secondary | ICD-10-CM | POA: Diagnosis not present

## 2018-04-28 DIAGNOSIS — R269 Unspecified abnormalities of gait and mobility: Secondary | ICD-10-CM | POA: Diagnosis not present

## 2018-04-28 DIAGNOSIS — S86001D Unspecified injury of right Achilles tendon, subsequent encounter: Secondary | ICD-10-CM | POA: Diagnosis not present

## 2018-05-01 DIAGNOSIS — M1712 Unilateral primary osteoarthritis, left knee: Secondary | ICD-10-CM | POA: Diagnosis not present

## 2018-05-01 DIAGNOSIS — M1711 Unilateral primary osteoarthritis, right knee: Secondary | ICD-10-CM | POA: Diagnosis not present

## 2018-05-01 DIAGNOSIS — M170 Bilateral primary osteoarthritis of knee: Secondary | ICD-10-CM | POA: Diagnosis not present

## 2018-05-02 DIAGNOSIS — R269 Unspecified abnormalities of gait and mobility: Secondary | ICD-10-CM | POA: Diagnosis not present

## 2018-05-02 DIAGNOSIS — M25572 Pain in left ankle and joints of left foot: Secondary | ICD-10-CM | POA: Diagnosis not present

## 2018-05-02 DIAGNOSIS — S86001D Unspecified injury of right Achilles tendon, subsequent encounter: Secondary | ICD-10-CM | POA: Diagnosis not present

## 2018-05-05 DIAGNOSIS — S86001D Unspecified injury of right Achilles tendon, subsequent encounter: Secondary | ICD-10-CM | POA: Diagnosis not present

## 2018-05-05 DIAGNOSIS — M25572 Pain in left ankle and joints of left foot: Secondary | ICD-10-CM | POA: Diagnosis not present

## 2018-05-05 DIAGNOSIS — R269 Unspecified abnormalities of gait and mobility: Secondary | ICD-10-CM | POA: Diagnosis not present

## 2018-05-08 DIAGNOSIS — M1712 Unilateral primary osteoarthritis, left knee: Secondary | ICD-10-CM | POA: Diagnosis not present

## 2018-05-08 DIAGNOSIS — M1711 Unilateral primary osteoarthritis, right knee: Secondary | ICD-10-CM | POA: Insufficient documentation

## 2018-05-09 DIAGNOSIS — R269 Unspecified abnormalities of gait and mobility: Secondary | ICD-10-CM | POA: Diagnosis not present

## 2018-05-09 DIAGNOSIS — M25572 Pain in left ankle and joints of left foot: Secondary | ICD-10-CM | POA: Diagnosis not present

## 2018-05-09 DIAGNOSIS — S86001D Unspecified injury of right Achilles tendon, subsequent encounter: Secondary | ICD-10-CM | POA: Diagnosis not present

## 2018-05-12 DIAGNOSIS — S86001D Unspecified injury of right Achilles tendon, subsequent encounter: Secondary | ICD-10-CM | POA: Diagnosis not present

## 2018-05-12 DIAGNOSIS — M25572 Pain in left ankle and joints of left foot: Secondary | ICD-10-CM | POA: Diagnosis not present

## 2018-05-12 DIAGNOSIS — R269 Unspecified abnormalities of gait and mobility: Secondary | ICD-10-CM | POA: Diagnosis not present

## 2018-05-16 DIAGNOSIS — G5761 Lesion of plantar nerve, right lower limb: Secondary | ICD-10-CM | POA: Diagnosis not present

## 2018-06-15 DIAGNOSIS — H35033 Hypertensive retinopathy, bilateral: Secondary | ICD-10-CM | POA: Diagnosis not present

## 2018-06-15 DIAGNOSIS — H348312 Tributary (branch) retinal vein occlusion, right eye, stable: Secondary | ICD-10-CM | POA: Diagnosis not present

## 2018-06-15 DIAGNOSIS — H353122 Nonexudative age-related macular degeneration, left eye, intermediate dry stage: Secondary | ICD-10-CM | POA: Diagnosis not present

## 2018-06-15 DIAGNOSIS — H40013 Open angle with borderline findings, low risk, bilateral: Secondary | ICD-10-CM | POA: Diagnosis not present

## 2018-06-16 ENCOUNTER — Telehealth: Payer: Self-pay

## 2018-06-16 DIAGNOSIS — R531 Weakness: Secondary | ICD-10-CM

## 2018-06-16 NOTE — Telephone Encounter (Signed)
Please advise  Copied from [place]. Topic: General - Other >> Jun 15, 2018  4:20 PM Sheran Luz wrote: Reason for CRM: Sharyn Lull from [facility] called in regards to the pt needing labs. She states that the pt needs myesthenia gravis lab and to inform her PCP and inquired as to if we could get that scheduled.

## 2018-06-18 NOTE — Telephone Encounter (Signed)
Ok Thx 

## 2018-10-26 ENCOUNTER — Other Ambulatory Visit: Payer: Self-pay | Admitting: Internal Medicine

## 2018-11-17 DIAGNOSIS — H40013 Open angle with borderline findings, low risk, bilateral: Secondary | ICD-10-CM | POA: Diagnosis not present

## 2018-11-17 DIAGNOSIS — H1852 Epithelial (juvenile) corneal dystrophy: Secondary | ICD-10-CM | POA: Diagnosis not present

## 2018-11-17 DIAGNOSIS — H02403 Unspecified ptosis of bilateral eyelids: Secondary | ICD-10-CM | POA: Diagnosis not present

## 2018-11-17 DIAGNOSIS — H04123 Dry eye syndrome of bilateral lacrimal glands: Secondary | ICD-10-CM | POA: Diagnosis not present

## 2019-03-15 ENCOUNTER — Encounter (INDEPENDENT_AMBULATORY_CARE_PROVIDER_SITE_OTHER): Payer: Medicare Other | Admitting: Ophthalmology

## 2019-03-15 ENCOUNTER — Other Ambulatory Visit: Payer: Self-pay

## 2019-03-15 DIAGNOSIS — H43813 Vitreous degeneration, bilateral: Secondary | ICD-10-CM | POA: Diagnosis not present

## 2019-03-15 DIAGNOSIS — I10 Essential (primary) hypertension: Secondary | ICD-10-CM

## 2019-03-15 DIAGNOSIS — H35371 Puckering of macula, right eye: Secondary | ICD-10-CM | POA: Diagnosis not present

## 2019-03-15 DIAGNOSIS — H34831 Tributary (branch) retinal vein occlusion, right eye, with macular edema: Secondary | ICD-10-CM | POA: Diagnosis not present

## 2019-03-15 DIAGNOSIS — H35033 Hypertensive retinopathy, bilateral: Secondary | ICD-10-CM

## 2019-04-06 ENCOUNTER — Ambulatory Visit: Payer: Self-pay

## 2019-04-06 NOTE — Telephone Encounter (Signed)
Spoke with pt to advise that an appt was needed as she has not been seen in over a year. Pt did not want to see another provider and will call back to scheduled once she finds a time he can some in. Unable to do virtual.

## 2019-04-06 NOTE — Telephone Encounter (Signed)
Patient called and says since last night she's had urgency, frequency, and burning with urination. She says her urine is yellow colored, not cloudy. She says no to fever, no to pain, but has pressure and the urge to go all the time. She denies any other symptoms. I called the office and spoke to [place], [facility] who asked to speak to the patient, the call was connected successfully.  Answer Assessment - Initial Assessment Questions 1. SYMPTOM: "What's the main symptom you're concerned about?" (e.g., frequency, incontinence)     Urgency, burning, frequency 2. ONSET: "When did the symptoms start?"     Last night 3. PAIN: "Is there any pain?" If so, ask: "How bad is it?" (Scale: 1-10; mild, moderate, severe)     No 4. CAUSE: "What do you think is causing the symptoms?"     UTI because have had one before 5. OTHER SYMPTOMS: "Do you have any other symptoms?" (e.g., fever, flank pain, blood in urine, pain with urination)     Just have pressure to the lower abdomen 6. PREGNANCY: "Is there any chance you are pregnant?" "When was your last menstrual period?"      No  Protocols used: URINARY [facility]

## 2019-07-12 DIAGNOSIS — H40013 Open angle with borderline findings, low risk, bilateral: Secondary | ICD-10-CM | POA: Diagnosis not present

## 2019-07-12 DIAGNOSIS — H35361 Drusen (degenerative) of macula, right eye: Secondary | ICD-10-CM | POA: Diagnosis not present

## 2019-07-12 DIAGNOSIS — H35033 Hypertensive retinopathy, bilateral: Secondary | ICD-10-CM | POA: Diagnosis not present

## 2019-07-12 DIAGNOSIS — H353122 Nonexudative age-related macular degeneration, left eye, intermediate dry stage: Secondary | ICD-10-CM | POA: Diagnosis not present

## 2019-07-24 ENCOUNTER — Other Ambulatory Visit: Payer: Self-pay | Admitting: Internal Medicine

## 2019-07-24 ENCOUNTER — Ambulatory Visit (INDEPENDENT_AMBULATORY_CARE_PROVIDER_SITE_OTHER): Payer: Medicare Other | Admitting: Internal Medicine

## 2019-07-24 ENCOUNTER — Other Ambulatory Visit (INDEPENDENT_AMBULATORY_CARE_PROVIDER_SITE_OTHER): Payer: Medicare Other

## 2019-07-24 ENCOUNTER — Encounter: Payer: Self-pay | Admitting: Internal Medicine

## 2019-07-24 ENCOUNTER — Other Ambulatory Visit: Payer: Self-pay

## 2019-07-24 VITALS — BP 124/72 | HR 56 | Temp 98.2°F | Ht 63.0 in | Wt 132.0 lb

## 2019-07-24 DIAGNOSIS — I10 Essential (primary) hypertension: Secondary | ICD-10-CM | POA: Diagnosis not present

## 2019-07-24 DIAGNOSIS — E785 Hyperlipidemia, unspecified: Secondary | ICD-10-CM

## 2019-07-24 DIAGNOSIS — M545 Low back pain, unspecified: Secondary | ICD-10-CM

## 2019-07-24 DIAGNOSIS — R600 Localized edema: Secondary | ICD-10-CM

## 2019-07-24 DIAGNOSIS — M79604 Pain in right leg: Secondary | ICD-10-CM

## 2019-07-24 LAB — CBC WITH DIFFERENTIAL/PLATELET
Basophils Absolute: 0.1 10*3/uL (ref 0.0–0.1)
Basophils Relative: 0.6 % (ref 0.0–3.0)
Eosinophils Absolute: 0.2 10*3/uL (ref 0.0–0.7)
Eosinophils Relative: 1.5 % (ref 0.0–5.0)
HCT: 41.8 % (ref 36.0–46.0)
Hemoglobin: 13.8 g/dL (ref 12.0–15.0)
Lymphocytes Relative: 18.5 % (ref 12.0–46.0)
Lymphs Abs: 1.9 10*3/uL (ref 0.7–4.0)
MCHC: 33 g/dL (ref 30.0–36.0)
MCV: 89.9 fl (ref 78.0–100.0)
Monocytes Absolute: 0.8 10*3/uL (ref 0.1–1.0)
Monocytes Relative: 8.1 % (ref 3.0–12.0)
Neutro Abs: 7.4 10*3/uL (ref 1.4–7.7)
Neutrophils Relative %: 71.3 % (ref 43.0–77.0)
Platelets: 271 10*3/uL (ref 150.0–400.0)
RBC: 4.65 Mil/uL (ref 3.87–5.11)
RDW: 14.3 % (ref 11.5–15.5)
WBC: 10.4 10*3/uL (ref 4.0–10.5)

## 2019-07-24 LAB — URINALYSIS, ROUTINE W REFLEX MICROSCOPIC
Bilirubin Urine: NEGATIVE
Ketones, ur: NEGATIVE
Nitrite: NEGATIVE
Specific Gravity, Urine: >=1.03 — AB (ref 1.000–1.030)
Total Protein, Urine: NEGATIVE
Urine Glucose: NEGATIVE
Urobilinogen, UA: 0.2 (ref 0.0–1.0)
pH: 5.5 (ref 5.0–8.0)

## 2019-07-24 LAB — HEPATIC FUNCTION PANEL
ALT: 14 U/L (ref 0–35)
AST: 17 U/L (ref 0–37)
Albumin: 4 g/dL (ref 3.5–5.2)
Alkaline Phosphatase: 70 U/L (ref 39–117)
Bilirubin, Direct: 0.1 mg/dL (ref 0.0–0.3)
Total Bilirubin: 0.6 mg/dL (ref 0.2–1.2)
Total Protein: 6.9 g/dL (ref 6.0–8.3)

## 2019-07-24 LAB — BASIC METABOLIC PANEL
BUN: 17 mg/dL (ref 6–23)
CO2: 29 mEq/L (ref 19–32)
Calcium: 9.3 mg/dL (ref 8.4–10.5)
Chloride: 103 mEq/L (ref 96–112)
Creatinine, Ser: 0.7 mg/dL (ref 0.40–1.20)
GFR: 80.03 mL/min (ref >60.00)
Glucose, Bld: 108 mg/dL — ABNORMAL HIGH (ref 70–99)
Potassium: 4.2 mEq/L (ref 3.5–5.1)
Sodium: 141 mEq/L (ref 135–145)

## 2019-07-24 LAB — TSH: TSH: 2.4 u[IU]/mL (ref 0.35–4.50)

## 2019-07-24 LAB — LIPID PANEL
Cholesterol: 135 mg/dL (ref 0–200)
HDL: 84.4 mg/dL (ref >39.00)
LDL Cholesterol: 44 mg/dL (ref 0–99)
NonHDL: 50.55
Total CHOL/HDL Ratio: 2
Triglycerides: 34 mg/dL (ref 0.0–149.0)
VLDL: 6.8 mg/dL (ref 0.0–40.0)

## 2019-07-24 MED ORDER — TIZANIDINE HCL 4 MG PO TABS: 4.0000 mg | ORAL_TABLET | Freq: Three times a day (TID) | ORAL | 1 refills | Status: DC | PRN

## 2019-07-24 MED ORDER — CARVEDILOL 25 MG PO TABS: 25.0000 mg | ORAL_TABLET | Freq: Two times a day (BID) | ORAL | 3 refills | Status: DC

## 2019-07-24 MED ORDER — IBUPROFEN 800 MG PO TABS: 800.0000 mg | ORAL_TABLET | Freq: Three times a day (TID) | ORAL | 1 refills | Status: DC

## 2019-07-24 MED ORDER — NITROGLYCERIN 0.2 MG/HR TD PT24: MEDICATED_PATCH | TRANSDERMAL | 12 refills | Status: DC

## 2019-07-24 NOTE — Progress Notes (Signed)
Subjective:  Patient ID: Erin [name], female    DOB: [birth year]  Age: 84 y.o. MRN: [id number]  CC: No chief complaint on file.   HPI Erin [name] presents for R hip pain - worse - has an appt w/Dr Ellene Route next week F/u HTN  Outpatient Medications Prior to Visit  Medication Sig Dispense Refill   carvedilol (COREG) 25 MG tablet Take 1 tablet (25 mg total) by mouth 2 (two) times daily with a meal. 180 tablet 3   Cholecalciferol (VITAMIN D3) 1000 UNITS tablet Take 1,000 Units by mouth daily.       furosemide (LASIX) 20 MG tablet Take 1 tablet (20 mg total) by mouth daily as needed. 30 tablet 2   ibuprofen (ADVIL,MOTRIN) 800 MG tablet TAKE ONE TABLET BY MOUTH THREE TIMES DAILY WITH MEALS 60 tablet 5   loratadine (CLARITIN) 10 MG tablet TAKE ONE TABLET BY MOUTH EVERY DAY AS NEEDED FOR ALLERGY 90 tablet 3   meloxicam (MOBIC) 7.5 MG tablet Take 1 tablet by mouth once daily as needed for pain. needs office visit before refills will be given 30 tablet 0   nitroGLYCERIN (NITRODUR - DOSED IN MG/24 HR) 0.2 mg/hr patch 1/2 patch over L Achilles tendon bid 30 patch 12   potassium chloride (KLOR-CON 10) 10 MEQ tablet Take 1 tablet (10 mEq total) by mouth daily. With Lasix 90 tablet 3   tretinoin (RETIN-A) 0.1 % cream Apply topically at bedtime. 45 g 3   No facility-administered medications prior to visit.     ROS: Review of Systems  Constitutional: Negative for activity change, appetite change, chills, fatigue and unexpected weight change.  HENT: Negative for congestion, mouth sores and sinus pressure.   Eyes: Negative for visual disturbance.  Respiratory: Negative for cough and chest tightness.   Gastrointestinal: Negative for abdominal pain and nausea.  Genitourinary: Negative for difficulty urinating, frequency and vaginal pain.  Musculoskeletal: Positive for arthralgias, back pain and gait problem.  Skin: Negative for pallor and rash.  Neurological: Negative for dizziness,  tremors, weakness, numbness and headaches.  Psychiatric/Behavioral: Negative for confusion and sleep disturbance.    Objective:  BP 124/72 (BP Location: Left Arm, Patient Position: Sitting, Cuff Size: Normal)    Pulse (!) 56    Temp 98.2 F (36.8 C) (Oral)    Ht 5\' 3"  (1.6 m)    Wt 132 lb (59.9 kg)    SpO2 95%    BMI 23.38 kg/m   BP Readings from Last 3 Encounters:  07/24/19 124/72  02/16/18 138/74  02/09/18 122/64    Wt Readings from Last 3 Encounters:  07/24/19 132 lb (59.9 kg)  02/09/18 130 lb (59 kg)  01/11/18 134 lb (60.8 kg)    Physical Exam Constitutional:      General: She is not in acute distress.    Appearance: She is well-developed.  HENT:     Head: Normocephalic.     Right Ear: External ear normal.     Left Ear: External ear normal.     Nose: Nose normal.  Eyes:     General:        Right eye: No discharge.        Left eye: No discharge.     Conjunctiva/sclera: Conjunctivae normal.     Pupils: Pupils are equal, round, and reactive to light.  Neck:     Musculoskeletal: Normal range of motion and neck supple.     Thyroid: No thyromegaly.     Vascular:  No JVD.     Trachea: No tracheal deviation.  Cardiovascular:     Rate and Rhythm: Normal rate and regular rhythm.     Heart sounds: Normal heart sounds.  Pulmonary:     Effort: No respiratory distress.     Breath sounds: No stridor. No wheezing.  Abdominal:     General: Bowel sounds are normal. There is no distension.     Palpations: Abdomen is soft. There is no mass.     Tenderness: There is no abdominal tenderness. There is no guarding or rebound.  Musculoskeletal:        General: Tenderness present.  Lymphadenopathy:     Cervical: No cervical adenopathy.  Skin:    Findings: No erythema or rash.  Neurological:     Cranial Nerves: No cranial nerve deficit.     Motor: No abnormal muscle tone.     Coordination: Coordination normal.     Deep Tendon Reflexes: Reflexes normal.  Psychiatric:         Behavior: Behavior normal.        Thought Content: Thought content normal.        Judgment: Judgment normal.   LS, R hip tender  Lab Results  Component Value Date   WBC 7.4 09/22/2016   HGB 13.7 09/22/2016   HCT 41.3 09/22/2016   PLT 265.0 09/22/2016   GLUCOSE 106 (H) 03/24/2017   CHOL 149 09/19/2012   TRIG 18.0 09/19/2012   HDL 91.40 09/19/2012   LDLCALC 54 09/19/2012   ALT 15 09/22/2016   AST 19 09/22/2016   NA 142 03/24/2017   K 4.6 03/24/2017   CL 104 03/24/2017   CREATININE 0.70 03/24/2017   BUN 25 (H) 03/24/2017   CO2 30 03/24/2017   TSH 2.49 09/22/2016    US Venous Img Lower Unilateral Right  Result Date: 08/12/2017 CLINICAL DATA:  Right posterior calf pain and swelling x2 days. EXAM: RIGHT LOWER EXTREMITY VENOUS DOPPLER ULTRASOUND TECHNIQUE: Gray-scale sonography with graded compression, as well as color Doppler and duplex ultrasound were performed to evaluate the lower extremity deep venous systems from the level of the common femoral vein and including the common femoral, femoral, profunda femoral, popliteal and calf veins including the posterior tibial, peroneal and gastrocnemius veins when visible. The superficial great saphenous vein was also interrogated. Spectral Doppler was utilized to evaluate flow at rest and with distal augmentation maneuvers in the common femoral, femoral and popliteal veins. COMPARISON:  None. FINDINGS: Contralateral Common Femoral Vein: Respiratory phasicity is normal and symmetric with the symptomatic side. No evidence of thrombus. Normal compressibility. Common Femoral Vein: No evidence of thrombus. Normal compressibility, respiratory phasicity and response to augmentation. Saphenofemoral Junction: No evidence of thrombus. Normal compressibility and flow on color Doppler imaging. Profunda Femoral Vein: No evidence of thrombus. Normal compressibility and flow on color Doppler imaging. Femoral Vein: No evidence of thrombus. Normal compressibility,  respiratory phasicity and response to augmentation. Popliteal Vein: No evidence of thrombus. Normal compressibility, respiratory phasicity and response to augmentation. Calf Veins: No evidence of thrombus. Normal compressibility and flow on color Doppler imaging. Superficial Great Saphenous Vein: No evidence of thrombus. Normal compressibility. Venous Reflux:  None. Other Findings:  None. IMPRESSION: No evidence of deep venous thrombosis. Electronically Signed   By: Ashley Royalty M.D.   On: 08/12/2017 19:42    Assessment & Plan:   There are no diagnoses linked to this encounter.   No orders of the defined types were placed in this encounter.  Follow-up: No follow-ups on file.  Walker Kehr, MD

## 2019-07-24 NOTE — Assessment & Plan Note (Signed)
No relapse 

## 2019-07-24 NOTE — Assessment & Plan Note (Signed)
Coreg

## 2019-07-24 NOTE — Assessment & Plan Note (Addendum)
R hip pain - worse - has an appt w/Dr Ellene Route next week NTG patch Ibuprofen prn

## 2019-07-24 NOTE — Patient Instructions (Signed)
If you have medicare related insurance (such as traditional Medicare, Blue Cross Medicare, United HealthCare Medicare, or similar), Please make an appointment at the scheduling desk with Jill, the Wellness Health Coach, for your Wellness visit in this office, which is a benefit with your insurance.  

## 2019-07-24 NOTE — Assessment & Plan Note (Signed)
R hip pain - worse - has an appt w/Dr Ellene Route next week Tizanidine prn

## 2019-08-01 DIAGNOSIS — M545 Low back pain: Secondary | ICD-10-CM | POA: Diagnosis not present

## 2019-08-01 DIAGNOSIS — M4180 Other forms of scoliosis, site unspecified: Secondary | ICD-10-CM | POA: Diagnosis not present

## 2019-08-06 DIAGNOSIS — M5116 Intervertebral disc disorders with radiculopathy, lumbar region: Secondary | ICD-10-CM | POA: Diagnosis not present

## 2019-08-06 DIAGNOSIS — M5416 Radiculopathy, lumbar region: Secondary | ICD-10-CM | POA: Diagnosis not present

## 2019-08-27 DIAGNOSIS — M79672 Pain in left foot: Secondary | ICD-10-CM | POA: Insufficient documentation

## 2019-08-27 DIAGNOSIS — M7662 Achilles tendinitis, left leg: Secondary | ICD-10-CM | POA: Diagnosis not present

## 2019-08-27 DIAGNOSIS — M79671 Pain in right foot: Secondary | ICD-10-CM | POA: Diagnosis not present

## 2019-08-27 DIAGNOSIS — M1712 Unilateral primary osteoarthritis, left knee: Secondary | ICD-10-CM | POA: Diagnosis not present

## 2019-08-27 DIAGNOSIS — M1711 Unilateral primary osteoarthritis, right knee: Secondary | ICD-10-CM | POA: Diagnosis not present

## 2019-08-27 DIAGNOSIS — M170 Bilateral primary osteoarthritis of knee: Secondary | ICD-10-CM | POA: Diagnosis not present

## 2019-10-26 DIAGNOSIS — I272 Pulmonary hypertension, unspecified: Secondary | ICD-10-CM

## 2019-10-26 HISTORY — DX: Pulmonary hypertension, unspecified: I27.20

## 2020-01-21 ENCOUNTER — Ambulatory Visit: Payer: Medicare Other | Admitting: Internal Medicine

## 2020-01-24 ENCOUNTER — Other Ambulatory Visit: Payer: Self-pay | Admitting: Internal Medicine

## 2020-01-30 ENCOUNTER — Other Ambulatory Visit: Payer: Self-pay

## 2020-01-30 ENCOUNTER — Encounter: Payer: Self-pay | Admitting: Internal Medicine

## 2020-01-30 ENCOUNTER — Ambulatory Visit (INDEPENDENT_AMBULATORY_CARE_PROVIDER_SITE_OTHER): Payer: Medicare Other | Admitting: Internal Medicine

## 2020-01-30 ENCOUNTER — Ambulatory Visit (INDEPENDENT_AMBULATORY_CARE_PROVIDER_SITE_OTHER): Payer: Medicare Other

## 2020-01-30 DIAGNOSIS — M7989 Other specified soft tissue disorders: Secondary | ICD-10-CM | POA: Diagnosis not present

## 2020-01-30 DIAGNOSIS — R0601 Orthopnea: Secondary | ICD-10-CM

## 2020-01-30 DIAGNOSIS — R600 Localized edema: Secondary | ICD-10-CM | POA: Diagnosis not present

## 2020-01-30 LAB — CBC WITH DIFFERENTIAL/PLATELET
Basophils Absolute: 0.1 10*3/uL (ref 0.0–0.1)
Basophils Relative: 0.8 % (ref 0.0–3.0)
Eosinophils Absolute: 0.2 10*3/uL (ref 0.0–0.7)
Eosinophils Relative: 2.1 % (ref 0.0–5.0)
HCT: 36.7 % (ref 36.0–46.0)
Hemoglobin: 12.2 g/dL (ref 12.0–15.0)
Lymphocytes Relative: 19.3 % (ref 12.0–46.0)
Lymphs Abs: 1.6 10*3/uL (ref 0.7–4.0)
MCHC: 33.3 g/dL (ref 30.0–36.0)
MCV: 88.4 fl (ref 78.0–100.0)
Monocytes Absolute: 0.7 10*3/uL (ref 0.1–1.0)
Monocytes Relative: 8.8 % (ref 3.0–12.0)
Neutro Abs: 5.9 10*3/uL (ref 1.4–7.7)
Neutrophils Relative %: 69 % (ref 43.0–77.0)
Platelets: 246 10*3/uL (ref 150.0–400.0)
RBC: 4.16 Mil/uL (ref 3.87–5.11)
RDW: 15.3 % (ref 11.5–15.5)
WBC: 8.5 10*3/uL (ref 4.0–10.5)

## 2020-01-30 LAB — HEPATIC FUNCTION PANEL
ALT: 16 U/L (ref 0–35)
AST: 19 U/L (ref 0–37)
Albumin: 4.1 g/dL (ref 3.5–5.2)
Alkaline Phosphatase: 68 U/L (ref 39–117)
Bilirubin, Direct: 0.2 mg/dL (ref 0.0–0.3)
Total Bilirubin: 0.8 mg/dL (ref 0.2–1.2)
Total Protein: 6.4 g/dL (ref 6.0–8.3)

## 2020-01-30 LAB — BASIC METABOLIC PANEL
BUN: 20 mg/dL (ref 6–23)
CO2: 32 mEq/L (ref 19–32)
Calcium: 9.2 mg/dL (ref 8.4–10.5)
Chloride: 106 mEq/L (ref 96–112)
Creatinine, Ser: 0.66 mg/dL (ref 0.40–1.20)
GFR: 85.54 mL/min (ref >60.00)
Glucose, Bld: 93 mg/dL (ref 70–99)
Potassium: 3.6 mEq/L (ref 3.5–5.1)
Sodium: 144 mEq/L (ref 135–145)

## 2020-01-30 LAB — TSH: TSH: 2.06 u[IU]/mL (ref 0.35–4.50)

## 2020-01-30 LAB — BRAIN NATRIURETIC PEPTIDE: Pro B Natriuretic peptide (BNP): 475 pg/mL — ABNORMAL HIGH (ref 0.0–100.0)

## 2020-01-30 MED ORDER — CEFUROXIME AXETIL 250 MG PO TABS: 250.0000 mg | ORAL_TABLET | Freq: Two times a day (BID) | ORAL | 1 refills | Status: AC

## 2020-01-30 MED ORDER — LORATADINE 10 MG PO TABS: ORAL_TABLET | ORAL | 3 refills | Status: DC

## 2020-01-30 MED ORDER — CARVEDILOL 25 MG PO TABS: 25.0000 mg | ORAL_TABLET | Freq: Two times a day (BID) | ORAL | 3 refills | Status: DC

## 2020-01-30 MED ORDER — DIAZEPAM 2 MG PO TABS: 2.0000 mg | ORAL_TABLET | Freq: Two times a day (BID) | ORAL | 3 refills | Status: DC

## 2020-01-30 MED ORDER — POTASSIUM CHLORIDE ER 10 MEQ PO TBCR: 10.0000 meq | EXTENDED_RELEASE_TABLET | Freq: Every day | ORAL | 3 refills | Status: DC

## 2020-01-30 MED ORDER — SPIRONOLACTONE 25 MG PO TABS: 50.0000 mg | ORAL_TABLET | Freq: Every day | ORAL | 3 refills | Status: DC

## 2020-01-30 NOTE — Progress Notes (Signed)
Subjective:  Patient ID: Erin [name], female    DOB: 04-08-1935  Age: 85 y.o. MRN: [id number]  CC: No chief complaint on file.   HPI Erin [name] presents for ankle edema 1 week C/o SOB at night  Outpatient Medications Prior to Visit  Medication Sig Dispense Refill  . carvedilol (COREG) 25 MG tablet Take 1 tablet (25 mg total) by mouth 2 (two) times daily with a meal. 180 tablet 3  . Cholecalciferol (VITAMIN D3) 1000 UNITS tablet Take 1,000 Units by mouth daily.      . furosemide (LASIX) 20 MG tablet Take 1 tablet (20 mg total) by mouth daily as needed. 30 tablet 2  . ibuprofen (ADVIL) 800 MG tablet Take 1 tablet (800 mg total) by mouth 3 (three) times daily with meals. 60 tablet 1  . loratadine (CLARITIN) 10 MG tablet TAKE ONE TABLET BY MOUTH EVERY DAY AS NEEDED FOR ALLERGY 90 tablet 3  . nitroGLYCERIN (NITRODUR - DOSED IN MG/24 HR) 0.2 mg/hr patch 1/2 patch over L Achilles tendon bid 30 patch 12  . potassium chloride (KLOR-CON 10) 10 MEQ tablet Take 1 tablet (10 mEq total) by mouth daily. With Lasix 90 tablet 3  . tiZANidine (ZANAFLEX) 4 MG tablet Take 1 tablet (4 mg total) by mouth every 8 (eight) hours as needed for muscle spasms. 30 tablet 1  . tretinoin (RETIN-A) 0.1 % cream Apply topically at bedtime. 45 g 3   No facility-administered medications prior to visit.    ROS: Review of Systems  Constitutional: Negative for activity change, appetite change, chills, fatigue and unexpected weight change.  HENT: Negative for congestion, mouth sores and sinus pressure.   Eyes: Negative for visual disturbance.  Respiratory: Positive for shortness of breath. Negative for cough and chest tightness.   Cardiovascular: Positive for leg swelling.  Gastrointestinal: Negative for abdominal pain and nausea.  Genitourinary: Negative for difficulty urinating, frequency and vaginal pain.  Musculoskeletal: Positive for back pain. Negative for gait problem.  Skin: Negative for pallor and  rash.  Neurological: Negative for dizziness, tremors, weakness, numbness and headaches.  Psychiatric/Behavioral: Negative for confusion, sleep disturbance and suicidal ideas.    Objective:  BP 128/78 (BP Location: Left Arm, Patient Position: Sitting, Cuff Size: Normal)   Pulse (!) 59   Temp 97.7 F (36.5 C) (Oral)   Ht 5\' 3"  (1.6 m)   Wt 128 lb (58.1 kg)   SpO2 97%   BMI 22.67 kg/m   BP Readings from Last 3 Encounters:  01/30/20 128/78  07/24/19 124/72  02/16/18 138/74    Wt Readings from Last 3 Encounters:  01/30/20 128 lb (58.1 kg)  07/24/19 132 lb (59.9 kg)  02/09/18 130 lb (59 kg)    Physical Exam Constitutional:      General: She is not in acute distress.    Appearance: She is well-developed.  HENT:     Head: Normocephalic.     Right Ear: External ear normal.     Left Ear: External ear normal.     Nose: Nose normal.  Eyes:     General:        Right eye: No discharge.        Left eye: No discharge.     Conjunctiva/sclera: Conjunctivae normal.     Pupils: Pupils are equal, round, and reactive to light.  Neck:     Thyroid: No thyromegaly.     Vascular: No JVD.     Trachea: No tracheal deviation.  Cardiovascular:     Rate and Rhythm: Normal rate and regular rhythm.     Heart sounds: Normal heart sounds.  Pulmonary:     Effort: No respiratory distress.     Breath sounds: No stridor. No wheezing.  Abdominal:     General: Bowel sounds are normal. There is no distension.     Palpations: Abdomen is soft. There is no mass.     Tenderness: There is no abdominal tenderness. There is no guarding or rebound.  Musculoskeletal:        General: No tenderness.     Cervical back: Normal range of motion and neck supple.  Lymphadenopathy:     Cervical: No cervical adenopathy.  Skin:    Findings: No erythema or rash.  Neurological:     Cranial Nerves: No cranial nerve deficit.     Motor: No abnormal muscle tone.     Coordination: Coordination normal.     Deep Tendon  Reflexes: Reflexes normal.  Psychiatric:        Behavior: Behavior normal.        Thought Content: Thought content normal.        Judgment: Judgment normal.   no edema  Lab Results  Component Value Date   WBC 10.4 07/24/2019   HGB 13.8 07/24/2019   HCT 41.8 07/24/2019   PLT 271.0 07/24/2019   GLUCOSE 108 (H) 07/24/2019   CHOL 135 07/24/2019   TRIG 34.0 07/24/2019   HDL 84.40 07/24/2019   LDLCALC 44 07/24/2019   ALT 14 07/24/2019   AST 17 07/24/2019   NA 141 07/24/2019   K 4.2 07/24/2019   CL 103 07/24/2019   CREATININE 0.70 07/24/2019   BUN 17 07/24/2019   CO2 29 07/24/2019   TSH 2.40 07/24/2019    US Venous Img Lower Unilateral Right  Result Date: 08/12/2017 CLINICAL DATA:  Right posterior calf pain and swelling x2 days. EXAM: RIGHT LOWER EXTREMITY VENOUS DOPPLER ULTRASOUND TECHNIQUE: Gray-scale sonography with graded compression, as well as color Doppler and duplex ultrasound were performed to evaluate the lower extremity deep venous systems from the level of the common femoral vein and including the common femoral, femoral, profunda femoral, popliteal and calf veins including the posterior tibial, peroneal and gastrocnemius veins when visible. The superficial great saphenous vein was also interrogated. Spectral Doppler was utilized to evaluate flow at rest and with distal augmentation maneuvers in the common femoral, femoral and popliteal veins. COMPARISON:  None. FINDINGS: Contralateral Common Femoral Vein: Respiratory phasicity is normal and symmetric with the symptomatic side. No evidence of thrombus. Normal compressibility. Common Femoral Vein: No evidence of thrombus. Normal compressibility, respiratory phasicity and response to augmentation. Saphenofemoral Junction: No evidence of thrombus. Normal compressibility and flow on color Doppler imaging. Profunda Femoral Vein: No evidence of thrombus. Normal compressibility and flow on color Doppler imaging. Femoral Vein: No  evidence of thrombus. Normal compressibility, respiratory phasicity and response to augmentation. Popliteal Vein: No evidence of thrombus. Normal compressibility, respiratory phasicity and response to augmentation. Calf Veins: No evidence of thrombus. Normal compressibility and flow on color Doppler imaging. Superficial Great Saphenous Vein: No evidence of thrombus. Normal compressibility. Venous Reflux:  None. Other Findings:  None. IMPRESSION: No evidence of deep venous thrombosis. Electronically Signed   By: Ashley Royalty M.D.   On: 08/12/2017 19:42    Assessment & Plan:   There are no diagnoses linked to this encounter.   No orders of the defined types were placed in this encounter.  Follow-up: No follow-ups on file.  Walker Kehr, MD

## 2020-01-30 NOTE — Addendum Note (Signed)
Addended by: Aviva Signs M on: 01/30/2020 11:46 PM   Modules accepted: Orders

## 2020-01-30 NOTE — Addendum Note (Signed)
Addended by: Trenda Moots on: [id number] 11:28 AM   Modules accepted: Orders

## 2020-01-30 NOTE — Assessment & Plan Note (Signed)
New CXR, labs 2D ECHO Re-start Spironolactone

## 2020-01-30 NOTE — Assessment & Plan Note (Signed)
Worse CXR, labs 2D ECHO Re-start Spironolactone

## 2020-02-01 ENCOUNTER — Telehealth: Payer: Self-pay | Admitting: Internal Medicine

## 2020-02-01 NOTE — Telephone Encounter (Signed)
   Patient requesting call back to discuss results- imaging

## 2020-02-04 NOTE — Telephone Encounter (Signed)
Pt.notified

## 2020-02-12 ENCOUNTER — Telehealth (HOSPITAL_COMMUNITY): Payer: Self-pay | Admitting: Internal Medicine

## 2020-02-12 NOTE — Telephone Encounter (Signed)
Patient cancelled echocardiogram due to her having the sorethroat.  I asked to reschedule and patient declined to do so at this time. Order will be removed from the WQ.

## 2020-02-14 ENCOUNTER — Other Ambulatory Visit (HOSPITAL_COMMUNITY): Payer: Medicare Other

## 2020-02-18 ENCOUNTER — Ambulatory Visit: Payer: Medicare Other | Admitting: Internal Medicine

## 2020-03-17 ENCOUNTER — Encounter (INDEPENDENT_AMBULATORY_CARE_PROVIDER_SITE_OTHER): Payer: Medicare Other | Admitting: Ophthalmology

## 2020-03-17 DIAGNOSIS — M7062 Trochanteric bursitis, left hip: Secondary | ICD-10-CM | POA: Diagnosis not present

## 2020-03-17 DIAGNOSIS — M25562 Pain in left knee: Secondary | ICD-10-CM | POA: Diagnosis not present

## 2020-03-17 DIAGNOSIS — M545 Low back pain: Secondary | ICD-10-CM | POA: Diagnosis not present

## 2020-03-17 DIAGNOSIS — M25552 Pain in left hip: Secondary | ICD-10-CM | POA: Diagnosis not present

## 2020-03-19 DIAGNOSIS — M7062 Trochanteric bursitis, left hip: Secondary | ICD-10-CM | POA: Diagnosis not present

## 2020-03-19 DIAGNOSIS — M25552 Pain in left hip: Secondary | ICD-10-CM | POA: Diagnosis not present

## 2020-03-19 DIAGNOSIS — M25562 Pain in left knee: Secondary | ICD-10-CM | POA: Diagnosis not present

## 2020-03-19 DIAGNOSIS — M545 Low back pain: Secondary | ICD-10-CM | POA: Diagnosis not present

## 2020-03-26 DIAGNOSIS — M1612 Unilateral primary osteoarthritis, left hip: Secondary | ICD-10-CM | POA: Diagnosis not present

## 2020-05-08 ENCOUNTER — Other Ambulatory Visit: Payer: Self-pay

## 2020-05-08 ENCOUNTER — Encounter (INDEPENDENT_AMBULATORY_CARE_PROVIDER_SITE_OTHER): Payer: Medicare Other | Admitting: Ophthalmology

## 2020-05-08 DIAGNOSIS — H43813 Vitreous degeneration, bilateral: Secondary | ICD-10-CM | POA: Diagnosis not present

## 2020-05-08 DIAGNOSIS — H35033 Hypertensive retinopathy, bilateral: Secondary | ICD-10-CM | POA: Diagnosis not present

## 2020-05-08 DIAGNOSIS — I10 Essential (primary) hypertension: Secondary | ICD-10-CM

## 2020-05-08 DIAGNOSIS — H348312 Tributary (branch) retinal vein occlusion, right eye, stable: Secondary | ICD-10-CM | POA: Diagnosis not present

## 2020-05-08 DIAGNOSIS — H353121 Nonexudative age-related macular degeneration, left eye, early dry stage: Secondary | ICD-10-CM

## 2020-05-26 ENCOUNTER — Telehealth: Payer: Self-pay

## 2020-05-26 NOTE — Telephone Encounter (Signed)
New message    Per patient C/o palpation and trouble breathing patient voiced she will not be going to the emergency room voiced only wants to see Dr. Alain Marion aware Dr.Plotnikov does not have an opening slot for today.   The patient is aware the call will be transfer over to the [facility] triage nurse for assessment.   Call / spoke with Cecille Rubin

## 2020-05-26 NOTE — Telephone Encounter (Signed)
[facility] transfered call back to office stating patient refused ED. Spoke to DR Plotnikov who recommended [facility] in [facility]. Called patient and she expressed understanding.

## 2020-06-18 ENCOUNTER — Telehealth: Payer: Self-pay

## 2020-06-18 NOTE — Telephone Encounter (Signed)
Ok with me 

## 2020-06-18 NOTE — Telephone Encounter (Signed)
Ok for transfer? 

## 2020-06-18 NOTE — Telephone Encounter (Signed)
Patient transfer request.  Patient prefers something closer to her home.   Pt is requesting to transfer FROM: Dr. Alain Marion Pt is requesting to transfer TO: Dr. Anitra Lauth Reason for requested transfer: Location   Best contact number: [phone]  Thank you

## 2020-06-19 NOTE — Telephone Encounter (Signed)
Please assist with scheduling, thanks.

## 2020-06-19 NOTE — Telephone Encounter (Signed)
OK w/me Thx 

## 2020-06-20 NOTE — Telephone Encounter (Signed)
Patient scheduled on 9/8

## 2020-07-02 ENCOUNTER — Ambulatory Visit: Payer: Medicare Other | Admitting: Family Medicine

## 2020-07-02 ENCOUNTER — Encounter: Payer: Self-pay | Admitting: Family Medicine

## 2020-07-02 ENCOUNTER — Other Ambulatory Visit: Payer: Self-pay

## 2020-07-02 ENCOUNTER — Ambulatory Visit (INDEPENDENT_AMBULATORY_CARE_PROVIDER_SITE_OTHER): Payer: Medicare Other | Admitting: Family Medicine

## 2020-07-02 VITALS — BP 120/55 | HR 58 | Temp 98.3°F | Resp 16 | Ht 61.0 in | Wt 131.6 lb

## 2020-07-02 DIAGNOSIS — R011 Cardiac murmur, unspecified: Secondary | ICD-10-CM

## 2020-07-02 DIAGNOSIS — I10 Essential (primary) hypertension: Secondary | ICD-10-CM | POA: Diagnosis not present

## 2020-07-02 DIAGNOSIS — I499 Cardiac arrhythmia, unspecified: Secondary | ICD-10-CM

## 2020-07-02 LAB — COMPREHENSIVE METABOLIC PANEL
ALT: 12 U/L (ref 0–35)
AST: 15 U/L (ref 0–37)
Albumin: 4.3 g/dL (ref 3.5–5.2)
Alkaline Phosphatase: 74 U/L (ref 39–117)
BUN: 23 mg/dL (ref 6–23)
CO2: 31 mEq/L (ref 19–32)
Calcium: 9.2 mg/dL (ref 8.4–10.5)
Chloride: 103 mEq/L (ref 96–112)
Creatinine, Ser: 0.63 mg/dL (ref 0.40–1.20)
GFR: 90.17 mL/min (ref >60.00)
Glucose, Bld: 94 mg/dL (ref 70–99)
Potassium: 4.5 mEq/L (ref 3.5–5.1)
Sodium: 142 mEq/L (ref 135–145)
Total Bilirubin: 0.8 mg/dL (ref 0.2–1.2)
Total Protein: 6.5 g/dL (ref 6.0–8.3)

## 2020-07-02 LAB — CBC WITH DIFFERENTIAL/PLATELET
Basophils Absolute: 0.1 10*3/uL (ref 0.0–0.1)
Basophils Relative: 0.9 % (ref 0.0–3.0)
Eosinophils Absolute: 0.2 10*3/uL (ref 0.0–0.7)
Eosinophils Relative: 2.3 % (ref 0.0–5.0)
HCT: 40.7 % (ref 36.0–46.0)
Hemoglobin: 13.4 g/dL (ref 12.0–15.0)
Lymphocytes Relative: 20.6 % (ref 12.0–46.0)
Lymphs Abs: 1.8 10*3/uL (ref 0.7–4.0)
MCHC: 32.9 g/dL (ref 30.0–36.0)
MCV: 90 fl (ref 78.0–100.0)
Monocytes Absolute: 0.6 10*3/uL (ref 0.1–1.0)
Monocytes Relative: 7.5 % (ref 3.0–12.0)
Neutro Abs: 5.9 10*3/uL (ref 1.4–7.7)
Neutrophils Relative %: 68.7 % (ref 43.0–77.0)
Platelets: 228 10*3/uL (ref 150.0–400.0)
RBC: 4.52 Mil/uL (ref 3.87–5.11)
RDW: 15.3 % (ref 11.5–15.5)
WBC: 8.5 10*3/uL (ref 4.0–10.5)

## 2020-07-02 LAB — LIPID PANEL
Cholesterol: 139 mg/dL (ref 0–200)
HDL: 80.3 mg/dL (ref >39.00)
LDL Cholesterol: 53 mg/dL (ref 0–99)
NonHDL: 59.1
Total CHOL/HDL Ratio: 2
Triglycerides: 30 mg/dL (ref 0.0–149.0)
VLDL: 6 mg/dL (ref 0.0–40.0)

## 2020-07-02 LAB — MAGNESIUM: Magnesium: 1.9 mg/dL (ref 1.5–2.5)

## 2020-07-02 LAB — TSH: TSH: 2.37 u[IU]/mL (ref 0.35–4.50)

## 2020-07-02 MED ORDER — CARVEDILOL 25 MG PO TABS
25.0000 mg | ORAL_TABLET | Freq: Every day | ORAL | 3 refills | Status: DC
Start: 2020-07-02 — End: 2020-07-02

## 2020-07-02 MED ORDER — CARVEDILOL 25 MG PO TABS
25.0000 mg | ORAL_TABLET | Freq: Every day | ORAL | 3 refills | Status: DC
Start: 2020-07-02 — End: 2022-06-07

## 2020-07-02 NOTE — Progress Notes (Signed)
Office Note 07/02/2020  CC:  Chief Complaint  Patient presents with  . Establish Care    Transfer of care from Dr.Plotnikov, would like to discuss shaking of right hand    HPI:  Erin [name] is a 85 y.o. White female who is here to transfer care and discuss  Patient's most recent primary MD: Dr. Alain Marion with [facility]. Old records in EPIC/[facility] EMR were reviewed prior to or during today's visit.  She does not exercise, is afraid it will flare back pain.   She takes 1/2 of 20mg  lasix daily for bilat LE swelling, R>L.  Takes tizanadine prn LL cramps, doesn't help much.  Diazepam: takes for insomnia a few times a week avg and it helps and has no adverse side effects.  Takes ibuprofen 800 mg prn low back pain.  Past Medical History:  Diagnosis Date  . Achilles tendon rupture    left, no surgery required  . Hypertension   . Low back pain   . Lower extremity edema    lasix prn  . OA (osteoarthritis)   . Osteopenia     Past Surgical History:  Procedure Laterality Date  . LUMBAR LAMINECTOMY  2004    Family History  Problem Relation Age of Onset  . Hypertension Mother   . Early death Father   . Cancer Father   . Hypertension Other     Social History   Socioeconomic History  . Marital status: Married    Spouse name: Not on file  . Number of children: Not on file  . Years of education: Not on file  . Highest education level: Not on file  Occupational History  . Not on file  Tobacco Use  . Smoking status: Former Research scientist (life sciences)  . Smokeless tobacco: Never Used  . Tobacco comment: As a teenager  Vaping Use  . Vaping Use: Never used  Substance and Sexual Activity  . Alcohol use: No  . Drug use: Never  . Sexual activity: Never  Other Topics Concern  . Not on file  Social History Narrative   Married, 6 children, about 15 GC.  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   No tob.   No  alc.   Social Determinants of Health   Financial Resource Strain:   . Difficulty of Paying Living Expenses: Not on file  Food Insecurity:   . Worried About Charity fundraiser in the Last Year: Not on file  . Ran Out of Food in the Last Year: Not on file  Transportation Needs:   . Lack of Transportation (Medical): Not on file  . Lack of Transportation (Non-Medical): Not on file  Physical Activity:   . Days of Exercise per Week: Not on file  . Minutes of Exercise per Session: Not on file  Stress:   . Feeling of Stress : Not on file  Social Connections:   . Frequency of Communication with Friends and Family: Not on file  . Frequency of Social Gatherings with Friends and Family: Not on file  . Attends Religious Services: Not on file  . Active Member of Clubs or Organizations: Not on file  . Attends Archivist Meetings: Not on file  . Marital Status: Not on file  Intimate Partner Violence:   . Fear of Current or Ex-Partner: Not on file  . Emotionally Abused: Not on file  .  Physically Abused: Not on file  . Sexually Abused: Not on file    Outpatient Encounter Medications as of 07/02/2020  Medication Sig  . carvedilol (COREG) 25 MG tablet Take 1 tablet (25 mg total) by mouth daily.  . Cholecalciferol (VITAMIN D3) 1000 UNITS tablet Take 1,000 Units by mouth daily.    . diazepam (VALIUM) 2 MG tablet Take 1 tablet (2 mg total) by mouth 2 (two) times daily.  . furosemide (LASIX) 20 MG tablet Take 1 tablet (20 mg total) by mouth daily as needed. (Patient taking differently: Take 10 mg by mouth daily as needed. )  . loratadine (CLARITIN) 10 MG tablet TAKE ONE TABLET BY MOUTH EVERY DAY AS NEEDED FOR ALLERGY  . Multiple Vitamins-Minerals (PRESERVISION AREDS PO) Take by mouth daily.  . Multiple Vitamins-Minerals (ZINC PO) Take by mouth daily.  . nitroGLYCERIN (NITRODUR - DOSED IN MG/24 HR) 0.2 mg/hr patch 1/2 patch over L Achilles tendon bid  . potassium chloride (KLOR-CON 10) 10  MEQ tablet Take 1 tablet (10 mEq total) by mouth daily. With Lasix  . [DISCONTINUED] carvedilol (COREG) 25 MG tablet Take 1 tablet (25 mg total) by mouth 2 (two) times daily with a meal. (Patient taking differently: Take 25 mg by mouth daily. )  . ibuprofen (ADVIL) 800 MG tablet Take 1 tablet (800 mg total) by mouth 3 (three) times daily with meals. (Patient not taking: Reported on 07/02/2020)  . tretinoin (RETIN-A) 0.1 % cream Apply topically at bedtime. (Patient not taking: Reported on 07/02/2020)  . [DISCONTINUED] spironolactone (ALDACTONE) 25 MG tablet Take 2 tablets (50 mg total) by mouth daily. (Patient not taking: Reported on 07/02/2020)  . [DISCONTINUED] tiZANidine (ZANAFLEX) 4 MG tablet Take 1 tablet (4 mg total) by mouth every 8 (eight) hours as needed for muscle spasms. (Patient not taking: Reported on 07/02/2020)   No facility-administered encounter medications on file as of 07/02/2020.    Allergies  Allergen Reactions  . Barbiturates   . Demerol Other (See Comments)    sick  . Hydrochlorothiazide   . Irbesartan     REACTION: HA  . Losartan Potassium     REACTION: hair loss  . Olmesartan Medoxomil     REACTION: hair loss  . Procaine   . Telmisartan     REACTION: cramps    ROS Review of Systems  Constitutional: Negative for appetite change, chills, fatigue and fever.  HENT: Negative for congestion, dental problem, ear pain and sore throat.   Eyes: Negative for discharge, redness and visual disturbance.  Respiratory: Negative for cough, chest tightness, shortness of breath and wheezing.   Cardiovascular: Negative for chest pain, palpitations and leg swelling.  Gastrointestinal: Negative for abdominal pain, blood in stool, diarrhea, nausea and vomiting.  Genitourinary: Negative for difficulty urinating, dysuria, flank pain, frequency, hematuria and urgency.  Musculoskeletal: Negative for arthralgias, back pain, joint swelling, myalgias and neck stiffness.  Skin: Negative for  pallor and rash.  Neurological: Positive for tremors (right hand). Negative for dizziness, speech difficulty, weakness and headaches.  Hematological: Negative for adenopathy. Does not bruise/bleed easily.  Psychiatric/Behavioral: Negative for confusion, dysphoric mood and sleep disturbance. The patient is not nervous/anxious.     PE; Blood pressure (!) 120/55, pulse (!) 58, temperature 98.3 F (36.8 C), temperature source Temporal, resp. rate 16, height 5\' 1"  (1.549 m), weight 131 lb 9.6 oz (59.7 kg), SpO2 97 %. Gen: Alert, well appearing.  Patient is oriented to person, place, time, and situation. AFFECT: pleasant, lucid thought and  speech. LPF:XTKW: no injection, icteris, swelling, or exudate.  EOMI, PERRLA. Mouth: lips without lesion/swelling.  Oral mucosa pink and moist. Oropharynx without erythema, exudate, or swelling.  Neck - No masses or thyromegaly or limitation in range of motion. No bruits. CV: some irregularity, 3/6 blowing murmur heard over entire precordium, best at LUSB and radiates into L infraclavicular area, S1 and S2 are distinct, no diastolic murmur, no rub/gallop. ABD: soft, NT/ND EXT: no clubbing or cyanosis.  no edema.   Pertinent labs:  Lab Results  Component Value Date   TSH 2.06 01/30/2020   Lab Results  Component Value Date   WBC 8.5 01/30/2020   HGB 12.2 01/30/2020   HCT 36.7 01/30/2020   MCV 88.4 01/30/2020   PLT 246.0 01/30/2020   Lab Results  Component Value Date   CREATININE 0.66 01/30/2020   BUN 20 01/30/2020   NA 144 01/30/2020   K 3.6 01/30/2020   CL 106 01/30/2020   CO2 32 01/30/2020   Lab Results  Component Value Date   ALT 16 01/30/2020   AST 19 01/30/2020   ALKPHOS 68 01/30/2020   BILITOT 0.8 01/30/2020   Lab Results  Component Value Date   CHOL 135 07/24/2019   Lab Results  Component Value Date   HDL 84.40 07/24/2019   Lab Results  Component Value Date   LDLCALC 44 07/24/2019   Lab Results  Component Value Date    TRIG 34.0 07/24/2019   Lab Results  Component Value Date   CHOLHDL 2 07/24/2019   12 lead EKG today: NSR, rate 60, isolated TWI in V1, no ischemic changes, no ectopy, normal intervals and duration, no hypertrophy.  Essentially normal EKG. No change compared to EKG 11/30/2013  ASSESSMENT AND PLAN:   No problem-specific Assessment & Plan notes found for this encounter.  Transfer pt:  1) Systolic murmur: EKG today normal. Echo ordered. Asymptomatic.  2) HTN: The current medical regimen is effective;  continue present plan and medications. Took aldactone off med list b/c it caused hair loss---she was switched to coreg and this works and she tolerates it. Lytes/cr today as well as hepatic panel, TSH, Mag level (hx of hypokalemia and pt on lasix daily), FLP, CBC. Continue/refilled coreg 25mg  which she takes ONCE A DAY.  3) L hand tremor: not addressed today. Will address at future visit.  An After Visit Summary was printed and given to the patient.  Return in about 2 weeks (around 07/16/2020) for f/u murmur/echo + address L hand tremor. Needs AWV pretty soon.  Signed:  Crissie Sickles, MD           07/02/2020

## 2020-07-03 ENCOUNTER — Ambulatory Visit (HOSPITAL_COMMUNITY): Payer: Medicare Other | Attending: Cardiology

## 2020-07-03 DIAGNOSIS — R011 Cardiac murmur, unspecified: Secondary | ICD-10-CM

## 2020-07-03 HISTORY — DX: Cardiac murmur, unspecified: R01.1

## 2020-07-03 HISTORY — PX: TRANSTHORACIC ECHOCARDIOGRAM: SHX275

## 2020-07-03 LAB — ECHOCARDIOGRAM COMPLETE
Area-P 1/2: 3.38 cm2
MV M vel: 5.4 m/s
MV Peak grad: 116.5 mmHg
Radius: 0.9 cm
S' Lateral: 2.7 cm

## 2020-07-04 NOTE — Progress Notes (Signed)
Pt aware of lab  results and had Echo done yesterday

## 2020-07-06 ENCOUNTER — Encounter: Payer: Self-pay | Admitting: Family Medicine

## 2020-07-09 ENCOUNTER — Telehealth: Payer: Self-pay | Admitting: Family Medicine

## 2020-07-09 NOTE — Telephone Encounter (Signed)
Patient states she had an EKG after her last visit with Dr. Anitra Lauth. She would like a call with results.

## 2020-07-09 NOTE — Telephone Encounter (Signed)
Patient called inquiring about echocardiogram results. Please follow up with patient

## 2020-07-10 ENCOUNTER — Other Ambulatory Visit: Payer: Self-pay | Admitting: Family Medicine

## 2020-07-10 DIAGNOSIS — I071 Rheumatic tricuspid insufficiency: Secondary | ICD-10-CM

## 2020-07-10 DIAGNOSIS — R931 Abnormal findings on diagnostic imaging of heart and coronary circulation: Secondary | ICD-10-CM

## 2020-07-10 DIAGNOSIS — I2721 Secondary pulmonary arterial hypertension: Secondary | ICD-10-CM

## 2020-07-10 DIAGNOSIS — R0600 Dyspnea, unspecified: Secondary | ICD-10-CM

## 2020-07-10 DIAGNOSIS — I270 Primary pulmonary hypertension: Secondary | ICD-10-CM

## 2020-07-10 DIAGNOSIS — R0609 Other forms of dyspnea: Secondary | ICD-10-CM

## 2020-07-10 DIAGNOSIS — I340 Nonrheumatic mitral (valve) insufficiency: Secondary | ICD-10-CM

## 2020-07-10 DIAGNOSIS — I5189 Other ill-defined heart diseases: Secondary | ICD-10-CM

## 2020-07-10 NOTE — Progress Notes (Signed)
Discussed results of echocardiogram with pt today. She does have some dyspnea with climbing a couple of flights of stairs but otherwise asymptomatic. I ordered referral to cardiologist for further evaluation--she requested Dr. Pernell Dupre.

## 2020-07-11 NOTE — Telephone Encounter (Signed)
I've discussed this with pt.

## 2020-07-14 DIAGNOSIS — H40013 Open angle with borderline findings, low risk, bilateral: Secondary | ICD-10-CM | POA: Diagnosis not present

## 2020-07-14 DIAGNOSIS — H3562 Retinal hemorrhage, left eye: Secondary | ICD-10-CM | POA: Diagnosis not present

## 2020-07-14 DIAGNOSIS — H353122 Nonexudative age-related macular degeneration, left eye, intermediate dry stage: Secondary | ICD-10-CM | POA: Diagnosis not present

## 2020-07-14 DIAGNOSIS — H35361 Drusen (degenerative) of macula, right eye: Secondary | ICD-10-CM | POA: Diagnosis not present

## 2020-07-17 ENCOUNTER — Other Ambulatory Visit: Payer: Self-pay

## 2020-07-17 ENCOUNTER — Encounter: Payer: Self-pay | Admitting: Family Medicine

## 2020-07-17 ENCOUNTER — Ambulatory Visit (INDEPENDENT_AMBULATORY_CARE_PROVIDER_SITE_OTHER): Payer: Medicare Other | Admitting: Family Medicine

## 2020-07-17 VITALS — BP 119/69 | HR 55 | Temp 98.0°F | Resp 16 | Ht 61.0 in | Wt 133.2 lb

## 2020-07-17 DIAGNOSIS — R251 Tremor, unspecified: Secondary | ICD-10-CM | POA: Diagnosis not present

## 2020-07-17 DIAGNOSIS — I340 Nonrheumatic mitral (valve) insufficiency: Secondary | ICD-10-CM

## 2020-07-17 DIAGNOSIS — G4762 Sleep related leg cramps: Secondary | ICD-10-CM

## 2020-07-17 MED ORDER — TIZANIDINE HCL 4 MG PO TABS: 4.0000 mg | ORAL_TABLET | Freq: Every day | ORAL | 6 refills | Status: DC

## 2020-07-17 NOTE — Progress Notes (Signed)
OFFICE VISIT  07/17/2020  CC:  Chief Complaint  Patient presents with  . Follow-up    2 week, Not Fasting   HPI:    Patient is a 85 y.o. Caucasian female who presents for 2 wk f/u L hand tremor and further discuss recently dx'd severe mitral valve regurgitation. She has appt with Dr. Tamala Julian 08/27/20 for further evaluation of this.  Onset about 5 mo ago, mild R arm/hand tremor intermittently.  Progressing gradually to constant and a bit more on the moderate intensity level.  Writing is more difficult. Denies feeling any weakness in R arm or hand.  She can do work with the arm/hand and hold eating utensils/cups fine.  Feels it mainly in R wrist and hand.  No tremor anywhere else. Has never had a tremor before this.  No shuffling.  No postural instability.   No FH of tremor.  She does not drink any alcohol. No HAs, no vision changes.  No hearing changes.  Of note, has long hx of recurrent nocturnal muscle spasms and a 4mg  tizanadine qhs has been quite helpful, asks for RF today.  Past Medical History:  Diagnosis Date  . Achilles tendon rupture    left, no surgery required  . Hypertension   . Low back pain   . Lower extremity edema    lasix prn  . OA (osteoarthritis)   . Osteopenia   . Systolic murmur 17/61/6073   severe mitral regurge, mod/severe tricuspic regurg    Past Surgical History:  Procedure Laterality Date  . APPENDECTOMY     85 yrs old  . LUMBAR LAMINECTOMY  2004  . TRANSTHORACIC ECHOCARDIOGRAM  07/03/2020   EF 60-65%, grd II DD, severe pulm art HTN, severe mitral valve regurg, mod/sev tricuspic valve regurg.    Outpatient Medications Prior to Visit  Medication Sig Dispense Refill  . carvedilol (COREG) 25 MG tablet Take 1 tablet (25 mg total) by mouth daily. 90 tablet 3  . Cholecalciferol (VITAMIN D3) 1000 UNITS tablet Take 1,000 Units by mouth daily.      . diazepam (VALIUM) 2 MG tablet Take 1 tablet (2 mg total) by mouth 2 (two) times daily. 60 tablet 3  .  furosemide (LASIX) 20 MG tablet Take 1 tablet (20 mg total) by mouth daily as needed. (Patient taking differently: Take 10 mg by mouth daily as needed. ) 30 tablet 2  . loratadine (CLARITIN) 10 MG tablet TAKE ONE TABLET BY MOUTH EVERY DAY AS NEEDED FOR ALLERGY 90 tablet 3  . Multiple Vitamins-Minerals (PRESERVISION AREDS PO) Take by mouth daily.    . Multiple Vitamins-Minerals (ZINC PO) Take by mouth daily.    . nitroGLYCERIN (NITRODUR - DOSED IN MG/24 HR) 0.2 mg/hr patch 1/2 patch over L Achilles tendon bid 30 patch 12  . potassium chloride (KLOR-CON 10) 10 MEQ tablet Take 1 tablet (10 mEq total) by mouth daily. With Lasix 90 tablet 3  . ibuprofen (ADVIL) 800 MG tablet Take 1 tablet (800 mg total) by mouth 3 (three) times daily with meals. (Patient not taking: Reported on 07/02/2020) 60 tablet 1  . tretinoin (RETIN-A) 0.1 % cream Apply topically at bedtime. (Patient not taking: Reported on 07/02/2020) 45 g 3   No facility-administered medications prior to visit.    Allergies  Allergen Reactions  . Barbiturates   . Demerol Other (See Comments)    sick  . Hydrochlorothiazide   . Irbesartan     REACTION: HA  . Losartan Potassium  REACTION: hair loss  . Olmesartan Medoxomil     REACTION: hair loss  . Procaine   . Telmisartan     REACTION: cramps    ROS As per HPI  PE: Vitals with BMI 07/17/2020 07/02/2020 01/30/2020  Height 5\' 1"  5\' 1"  5\' 3"   Weight 133 lbs 4 oz 131 lbs 10 oz 128 lbs  BMI 25.19 54.09 81.19  Systolic 147 829 562  Diastolic 69 55 78  Pulse 55 58 59   Gen: Alert, well appearing.  Patient is oriented to person, place, time, and situation. AFFECT: pleasant, lucid thought and speech. Face:normal expression range. ZHY:QMVH: no injection, icteris, swelling, or exudate.  EOMI, PERRLA. Mouth: lips without lesion/swelling.  Oral mucosa pink and moist. Oropharynx without erythema, exudate, or swelling.  Neck: no bruits. CV: RRR, 4/6 harsh systolic murmur, no diastolic  murmur.  No r/g. Chest is clear, no wheezing or rales. Normal symmetric air entry throughout both lung fields. No chest wall deformities or tenderness. EXT: no clubbing or cyanosis.  no edema.  Neuro: CN 2-12 intact bilaterally, strength 5/5 in proximal and distal upper extremities and lower extremities bilaterally.  No sensory deficits.  No tremor at all on either arm or hand when resting on lap against gravity, or with FNF.  No disdiadochokinesis.  No ataxia.  Upper extremity and lower extremity DTRs symmetric.  No pronator drift. No postural instability or shuffling gait.  No cogwheel rigidity.   LABS:  Lab Results  Component Value Date   TSH 2.37 07/02/2020   Lab Results  Component Value Date   WBC 8.5 07/02/2020   HGB 13.4 07/02/2020   HCT 40.7 07/02/2020   MCV 90.0 07/02/2020   PLT 228.0 07/02/2020   Lab Results  Component Value Date   CREATININE 0.63 07/02/2020   BUN 23 07/02/2020   NA 142 07/02/2020   K 4.5 07/02/2020   CL 103 07/02/2020   CO2 31 07/02/2020   Lab Results  Component Value Date   ALT 12 07/02/2020   AST 15 07/02/2020   ALKPHOS 74 07/02/2020   BILITOT 0.8 07/02/2020   Lab Results  Component Value Date   CHOL 139 07/02/2020   Lab Results  Component Value Date   HDL 80.30 07/02/2020   Lab Results  Component Value Date   LDLCALC 53 07/02/2020   Lab Results  Component Value Date   TRIG 30.0 07/02/2020   Lab Results  Component Value Date   CHOLHDL 2 07/02/2020    IMPRESSION AND PLAN:  1) R arm tremor x 5 mo, not noted today. No signs concerning for parkinsonism but obviously unilateral tremor can be a beginning sign of this.  Doubt essential tremor. No indication for imaging---entire neuro exam normal. Will hold off on neuro referral.  Watchful waiting approach. Signs/symptoms to call or return for were reviewed and pt expressed understanding.  2) Severe Mitral regurg; asymptomatic.  Has cardiology eval arranged for early Nov with Dr.  Tamala Julian.  3) Chronic recurrent nocturnal leg cramps: I think it is ok to continue nightly tizanidine so I sent in 4mg  tabs, 1 qhs, #30, RF x 6.  An After Visit Summary was printed and given to the patient.  FOLLOW UP: Return in about 6 months (around 01/14/2021) for f/u tremor/RCI.  Signed:  Crissie Sickles, MD           07/17/2020

## 2020-07-25 DIAGNOSIS — M25562 Pain in left knee: Secondary | ICD-10-CM | POA: Diagnosis not present

## 2020-07-25 DIAGNOSIS — M25561 Pain in right knee: Secondary | ICD-10-CM | POA: Diagnosis not present

## 2020-07-25 DIAGNOSIS — M25572 Pain in left ankle and joints of left foot: Secondary | ICD-10-CM | POA: Diagnosis not present

## 2020-08-04 DIAGNOSIS — M170 Bilateral primary osteoarthritis of knee: Secondary | ICD-10-CM | POA: Diagnosis not present

## 2020-08-11 DIAGNOSIS — M170 Bilateral primary osteoarthritis of knee: Secondary | ICD-10-CM | POA: Diagnosis not present

## 2020-08-12 ENCOUNTER — Encounter: Payer: Self-pay | Admitting: Family Medicine

## 2020-08-13 DIAGNOSIS — M170 Bilateral primary osteoarthritis of knee: Secondary | ICD-10-CM | POA: Diagnosis not present

## 2020-08-18 DIAGNOSIS — H40013 Open angle with borderline findings, low risk, bilateral: Secondary | ICD-10-CM | POA: Diagnosis not present

## 2020-08-18 DIAGNOSIS — M170 Bilateral primary osteoarthritis of knee: Secondary | ICD-10-CM | POA: Diagnosis not present

## 2020-08-26 ENCOUNTER — Encounter: Payer: Self-pay | Admitting: Family Medicine

## 2020-08-26 NOTE — H&P (View-Only) (Signed)
Cardiology Office Note:    Date:  08/27/2020   ID:  Erin [name], DOB 12/28/1934, MRN [id number]  PCP:  Tammi Sou, MD  Cardiologist:  No primary care provider on file.   Referring MD: Tammi Sou, MD   Chief Complaint  Patient presents with  . Congestive Heart Failure  . Cardiac Valve Problem    History of Present Illness:    Erin [name] is a 85 y.o. female with a hx of referred for cardiology consultation due to murmur and echocardiogram demonstrating severe MR.  Developed shortness of breath, orthopnea, and improved after diuretic therapy.  It was recommended that she undergo 2D Doppler echocardiography but she did not follow-up with Dr. Alain Marion concerning the recommendation.  She now sees Dr. Shawnie Dapper who encouraged her to have the echo done.  Echo demonstrates a flail P2 segment of the mitral valve and severe mitral regurgitation.  Since initially having congestion, she seems to have compensated and is doing better.  She does notice exertional fatigue.  She is unable to lie on her back because she gets short of breath.  She denies chest pain and has no history of CAD.  She has a twin sister who lives in [place].  The sister has severe mitral regurgitation and has been spoken to by cardiologist concerning mitral valve repair and or MitraClip.  She has no significant risk factors for vascular disease other than history of hypertension, age, but is not diabetic and does not smoke.   Says she was diagnosed with mitral valve disease in her 70s.  Had a heart catheterization at that time which would have been 50 years ago.  She does not remember anything about that.  She does not feel that a diagnosis of heart disease was made at that time.    Past Medical History:  Diagnosis Date  . Achilles tendon rupture    left, no surgery required  . Hypertension   . Hypertensive retinopathy of both eyes    Dr. Herbert Deaner  . Low back pain   . Lower extremity edema      lasix prn  . OA (osteoarthritis)   . Osteopenia   . Systolic murmur 67/09/4579   severe mitral regurge, mod/severe tricuspic regurg    Past Surgical History:  Procedure Laterality Date  . APPENDECTOMY     85 yrs old  . LUMBAR LAMINECTOMY  2004  . TRANSTHORACIC ECHOCARDIOGRAM  07/03/2020   EF 60-65%, grd II DD, severe pulm art HTN, severe mitral valve regurg, mod/sev tricuspic valve regurg.    Current Medications: Current Meds  Medication Sig  . acetaminophen (TYLENOL 8 HOUR) 650 MG CR tablet Take 650 mg by mouth every 8 (eight) hours as needed for pain.  . carvedilol (COREG) 25 MG tablet Take 1 tablet (25 mg total) by mouth daily.  . Cholecalciferol (VITAMIN D3) 1000 UNITS tablet Take 1,000 Units by mouth daily.    . diazepam (VALIUM) 2 MG tablet Take 1 tablet (2 mg total) by mouth 2 (two) times daily.  . furosemide (LASIX) 20 MG tablet Take 1 tablet (20 mg total) by mouth daily as needed. (Patient taking differently: Take 10 mg by mouth daily as needed. )  . loratadine (CLARITIN) 10 MG tablet TAKE ONE TABLET BY MOUTH EVERY DAY AS NEEDED FOR ALLERGY  . Multiple Vitamins-Minerals (PRESERVISION AREDS PO) Take by mouth daily.  . Multiple Vitamins-Minerals (ZINC PO) Take by mouth daily.  . nitroGLYCERIN (NITRODUR - DOSED IN MG/24  HR) 0.2 mg/hr patch 1/2 patch over L Achilles tendon bid  . potassium chloride (KLOR-CON) 10 MEQ tablet Take 10 mEq by mouth as needed. With lasix  . tiZANidine (ZANAFLEX) 4 MG tablet Take 1 tablet (4 mg total) by mouth at bedtime.  . tretinoin (RETIN-A) 0.1 % cream Apply topically at bedtime.  . [DISCONTINUED] ibuprofen (ADVIL) 800 MG tablet Take 1 tablet (800 mg total) by mouth 3 (three) times daily with meals.     Allergies:   Barbiturates, Demerol, Hydrochlorothiazide, Irbesartan, Losartan potassium, Olmesartan medoxomil, Procaine, and Telmisartan   Social History   Socioeconomic History  . Marital status: Married    Spouse name: Not on file  .  Number of children: Not on file  . Years of education: Not on file  . Highest education level: Not on file  Occupational History  . Not on file  Tobacco Use  . Smoking status: Former Research scientist (life sciences)  . Smokeless tobacco: Never Used  . Tobacco comment: As a teenager  Vaping Use  . Vaping Use: Never used  Substance and Sexual Activity  . Alcohol use: No  . Drug use: Never  . Sexual activity: Never  Other Topics Concern  . Not on file  Social History Narrative   Married, 6 children, about 15 GC.  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   No tob.   No alc.   Social Determinants of Health   Financial Resource Strain:   . Difficulty of Paying Living Expenses: Not on file  Food Insecurity:   . Worried About Charity fundraiser in the Last Year: Not on file  . Ran Out of Food in the Last Year: Not on file  Transportation Needs:   . Lack of Transportation (Medical): Not on file  . Lack of Transportation (Non-Medical): Not on file  Physical Activity:   . Days of Exercise per Week: Not on file  . Minutes of Exercise per Session: Not on file  Stress:   . Feeling of Stress : Not on file  Social Connections:   . Frequency of Communication with Friends and Family: Not on file  . Frequency of Social Gatherings with Friends and Family: Not on file  . Attends Religious Services: Not on file  . Active Member of Clubs or Organizations: Not on file  . Attends Archivist Meetings: Not on file  . Marital Status: Not on file     Family History: The patient's family history includes Cancer in her father; Early death in her father; Hypertension in her mother and another family member.  ROS:   Please see the history of present illness.    Osteoarthritis is her major limiting medical problem all other systems reviewed and are negative.  EKGs/Labs/Other Studies Reviewed:    The following studies were reviewed today:  2D  Doppler ECHOCARDIOGRAM 07/03/2020: IMPRESSIONS    1. Left ventricular ejection fraction, by estimation, is 60 to 65%. The  left ventricle has normal function. The left ventricle has no regional  wall motion abnormalities. There is mild left ventricular hypertrophy.  Left ventricular diastolic parameters Normal LV size. are consistent with Grade II diastolic dysfunction (pseudonormalization).  2. Right ventricular systolic function is normal. The right ventricular  size is normal. There is severely elevated pulmonary artery systolic  pressure. The estimated right ventricular systolic pressure is 38.1 mmHg.  3. Left atrial size  was severely dilated.  4. Right atrial size was moderately dilated.  5. The mitral valve is abnormal. There appears to be partial flail of the  P2 segment of the posterior mitral leaflet with possibly a ruptured chord.  Severe mitral valve regurgitation, anteriorly-directed. No evidence of  mitral stenosis.  6. Tricuspid valve regurgitation is moderate to severe.  7. The aortic valve is tricuspid. Aortic valve regurgitation is trivial.  No aortic stenosis is present.  8. The inferior vena cava is normal in size with <50% respiratory  variability, suggesting right atrial pressure of 8 mmHg.   EKG:  EKG 07/02/2020 shows LAA, prominent QRS voltage, and otherwise unremarkable.  Recent Labs: 01/30/2020: Pro B Natriuretic peptide (BNP) 475.0 07/02/2020: ALT 12; BUN 23; Creatinine, Ser 0.63; Hemoglobin 13.4; Magnesium 1.9; Platelets 228.0; Potassium 4.5; Sodium 142; TSH 2.37  Recent Lipid Panel    Component Value Date/Time   CHOL 139 07/02/2020 1057   TRIG 30.0 07/02/2020 1057   TRIG 33 09/21/2006 1115   HDL 80.30 07/02/2020 1057   CHOLHDL 2 07/02/2020 1057   VLDL 6.0 07/02/2020 1057   LDLCALC 53 07/02/2020 1057    Physical Exam:    VS:  BP 140/72   Pulse 76   Ht 5\' 1"  (1.549 m)   Wt 130 lb 3.2 oz (59.1 kg)   SpO2 97%   BMI 24.60 kg/m     Wt Readings  from Last 3 Encounters:  08/27/20 130 lb 3.2 oz (59.1 kg)  07/17/20 133 lb 4 oz (60.4 kg)  07/02/20 131 lb 9.6 oz (59.7 kg)     GEN: Elderly and appears her stated age. No acute distress HEENT: Normal NECK: No JVD. LYMPHATICS: No lymphadenopathy CARDIAC: 3/6 to 4/6 holosystolic murmur of mitral regurgitation along left sternal border, apex, left axilla, back, and towards the base of her heart.  RRR without diastolic murmur, gallop, or edema. VASCULAR:  Normal Pulses. No bruits. RESPIRATORY:  Clear to auscultation without rales, wheezing or rhonchi  ABDOMEN: Soft, non-tender, non-distended, No pulsatile mass, MUSCULOSKELETAL: No deformity  SKIN: Warm and dry NEUROLOGIC:  Alert and oriented x 3 PSYCHIATRIC:  Normal affect   ASSESSMENT:    1. Severe mitral regurgitation   2. Severe pulmonary hypertension ([facility])   3. Essential hypertension   4. Osteoarthritis of knee, unspecified laterality, unspecified osteoarthritis type   5. Educated about COVID-19 virus infection    PLAN:    In order of problems listed above:  1. Severe mitral regurgitation related to flail mitral leaflet.  Transesophageal echo, left and right heart cath with coronary angiography will be performed expediently.  She will then be referred to the structural mitral valve clinic as well as Dr. Darylene Price: for consideration of mitral valve therapy. 2. This will be evaluated with left heart cath.  Echo suggested PA systolic pressure around 65 mmHg. 3. She has elevated systolic blood pressure and afterload reduction would be helpful.  We will likely add ARB or ACE inhibitor. 4. Did not discuss 5. She has been vaccinated and does not have COVID-19 disease.   The patient was counseled to undergo left heart catheterization, coronary angiography, and possible percutaneous coronary intervention with stent implantation. The procedural risks and benefits were discussed in detail. The risks discussed included death, stroke,  myocardial infarction, life-threatening bleeding, limb ischemia, kidney injury, allergy, and possible emergency cardiac surgery. The risk of these significant complications were estimated to occur less than 1% of the time. After discussion, the patient has agreed  to proceed.   Described transesophageal echocardiography, the need for sedation, the risk of esophageal injury/perforation, bleeding, among other complications.  Patient understands and accepts these risks.  Medication Adjustments/Labs and Tests Ordered: Current medicines are reviewed at length with the patient today.  Concerns regarding medicines are outlined above.  No orders of the defined types were placed in this encounter.  No orders of the defined types were placed in this encounter.   There are no Patient Instructions on file for this visit.   Signed, Sinclair Grooms, MD  08/27/2020 3:03 PM    [place]

## 2020-08-26 NOTE — Progress Notes (Signed)
Cardiology Office Note:    Date:  08/27/2020   ID:  Erin [name], DOB 28-Aug-1935, MRN [id number]  PCP:  Tammi Sou, MD  Cardiologist:  No primary care provider on file.   Referring MD: Tammi Sou, MD   Chief Complaint  Patient presents with  . Congestive Heart Failure  . Cardiac Valve Problem    History of Present Illness:    Erin [name] is a 85 y.o. female with a hx of referred for cardiology consultation due to murmur and echocardiogram demonstrating severe MR.  Developed shortness of breath, orthopnea, and improved after diuretic therapy.  It was recommended that she undergo 2D Doppler echocardiography but she did not follow-up with Dr. Alain Marion concerning the recommendation.  She now sees Dr. Shawnie Dapper who encouraged her to have the echo done.  Echo demonstrates a flail P2 segment of the mitral valve and severe mitral regurgitation.  Since initially having congestion, she seems to have compensated and is doing better.  She does notice exertional fatigue.  She is unable to lie on her back because she gets short of breath.  She denies chest pain and has no history of CAD.  She has a twin sister who lives in [place].  The sister has severe mitral regurgitation and has been spoken to by cardiologist concerning mitral valve repair and or MitraClip.  She has no significant risk factors for vascular disease other than history of hypertension, age, but is not diabetic and does not smoke.   Says she was diagnosed with mitral valve disease in her 58s.  Had a heart catheterization at that time which would have been 50 years ago.  She does not remember anything about that.  She does not feel that a diagnosis of heart disease was made at that time.    Past Medical History:  Diagnosis Date  . Achilles tendon rupture    left, no surgery required  . Hypertension   . Hypertensive retinopathy of both eyes    Dr. Herbert Deaner  . Low back pain   . Lower extremity edema      lasix prn  . OA (osteoarthritis)   . Osteopenia   . Systolic murmur 63/33/5456   severe mitral regurge, mod/severe tricuspic regurg    Past Surgical History:  Procedure Laterality Date  . APPENDECTOMY     85 yrs old  . LUMBAR LAMINECTOMY  2004  . TRANSTHORACIC ECHOCARDIOGRAM  07/03/2020   EF 60-65%, grd II DD, severe pulm art HTN, severe mitral valve regurg, mod/sev tricuspic valve regurg.    Current Medications: Current Meds  Medication Sig  . acetaminophen (TYLENOL 8 HOUR) 650 MG CR tablet Take 650 mg by mouth every 8 (eight) hours as needed for pain.  . carvedilol (COREG) 25 MG tablet Take 1 tablet (25 mg total) by mouth daily.  . Cholecalciferol (VITAMIN D3) 1000 UNITS tablet Take 1,000 Units by mouth daily.    . diazepam (VALIUM) 2 MG tablet Take 1 tablet (2 mg total) by mouth 2 (two) times daily.  . furosemide (LASIX) 20 MG tablet Take 1 tablet (20 mg total) by mouth daily as needed. (Patient taking differently: Take 10 mg by mouth daily as needed. )  . loratadine (CLARITIN) 10 MG tablet TAKE ONE TABLET BY MOUTH EVERY DAY AS NEEDED FOR ALLERGY  . Multiple Vitamins-Minerals (PRESERVISION AREDS PO) Take by mouth daily.  . Multiple Vitamins-Minerals (ZINC PO) Take by mouth daily.  . nitroGLYCERIN (NITRODUR - DOSED IN MG/24  HR) 0.2 mg/hr patch 1/2 patch over L Achilles tendon bid  . potassium chloride (KLOR-CON) 10 MEQ tablet Take 10 mEq by mouth as needed. With lasix  . tiZANidine (ZANAFLEX) 4 MG tablet Take 1 tablet (4 mg total) by mouth at bedtime.  . tretinoin (RETIN-A) 0.1 % cream Apply topically at bedtime.  . [DISCONTINUED] ibuprofen (ADVIL) 800 MG tablet Take 1 tablet (800 mg total) by mouth 3 (three) times daily with meals.     Allergies:   Barbiturates, Demerol, Hydrochlorothiazide, Irbesartan, Losartan potassium, Olmesartan medoxomil, Procaine, and Telmisartan   Social History   Socioeconomic History  . Marital status: Married    Spouse name: Not on file  .  Number of children: Not on file  . Years of education: Not on file  . Highest education level: Not on file  Occupational History  . Not on file  Tobacco Use  . Smoking status: Former Research scientist (life sciences)  . Smokeless tobacco: Never Used  . Tobacco comment: As a teenager  Vaping Use  . Vaping Use: Never used  Substance and Sexual Activity  . Alcohol use: No  . Drug use: Never  . Sexual activity: Never  Other Topics Concern  . Not on file  Social History Narrative   Married, 6 children, about 15 GC.  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   No tob.   No alc.   Social Determinants of Health   Financial Resource Strain:   . Difficulty of Paying Living Expenses: Not on file  Food Insecurity:   . Worried About Charity fundraiser in the Last Year: Not on file  . Ran Out of Food in the Last Year: Not on file  Transportation Needs:   . Lack of Transportation (Medical): Not on file  . Lack of Transportation (Non-Medical): Not on file  Physical Activity:   . Days of Exercise per Week: Not on file  . Minutes of Exercise per Session: Not on file  Stress:   . Feeling of Stress : Not on file  Social Connections:   . Frequency of Communication with Friends and Family: Not on file  . Frequency of Social Gatherings with Friends and Family: Not on file  . Attends Religious Services: Not on file  . Active Member of Clubs or Organizations: Not on file  . Attends Archivist Meetings: Not on file  . Marital Status: Not on file     Family History: The patient's family history includes Cancer in her father; Early death in her father; Hypertension in her mother and another family member.  ROS:   Please see the history of present illness.    Osteoarthritis is her major limiting medical problem all other systems reviewed and are negative.  EKGs/Labs/Other Studies Reviewed:    The following studies were reviewed today:  2D  Doppler ECHOCARDIOGRAM 07/03/2020: IMPRESSIONS    1. Left ventricular ejection fraction, by estimation, is 60 to 65%. The  left ventricle has normal function. The left ventricle has no regional  wall motion abnormalities. There is mild left ventricular hypertrophy.  Left ventricular diastolic parameters Normal LV size. are consistent with Grade II diastolic dysfunction (pseudonormalization).  2. Right ventricular systolic function is normal. The right ventricular  size is normal. There is severely elevated pulmonary artery systolic  pressure. The estimated right ventricular systolic pressure is 38.4 mmHg.  3. Left atrial size  was severely dilated.  4. Right atrial size was moderately dilated.  5. The mitral valve is abnormal. There appears to be partial flail of the  P2 segment of the posterior mitral leaflet with possibly a ruptured chord.  Severe mitral valve regurgitation, anteriorly-directed. No evidence of  mitral stenosis.  6. Tricuspid valve regurgitation is moderate to severe.  7. The aortic valve is tricuspid. Aortic valve regurgitation is trivial.  No aortic stenosis is present.  8. The inferior vena cava is normal in size with <50% respiratory  variability, suggesting right atrial pressure of 8 mmHg.   EKG:  EKG 07/02/2020 shows LAA, prominent QRS voltage, and otherwise unremarkable.  Recent Labs: 01/30/2020: Pro B Natriuretic peptide (BNP) 475.0 07/02/2020: ALT 12; BUN 23; Creatinine, Ser 0.63; Hemoglobin 13.4; Magnesium 1.9; Platelets 228.0; Potassium 4.5; Sodium 142; TSH 2.37  Recent Lipid Panel    Component Value Date/Time   CHOL 139 07/02/2020 1057   TRIG 30.0 07/02/2020 1057   TRIG 33 09/21/2006 1115   HDL 80.30 07/02/2020 1057   CHOLHDL 2 07/02/2020 1057   VLDL 6.0 07/02/2020 1057   LDLCALC 53 07/02/2020 1057    Physical Exam:    VS:  BP 140/72   Pulse 76   Ht 5\' 1"  (1.549 m)   Wt 130 lb 3.2 oz (59.1 kg)   SpO2 97%   BMI 24.60 kg/m     Wt Readings  from Last 3 Encounters:  08/27/20 130 lb 3.2 oz (59.1 kg)  07/17/20 133 lb 4 oz (60.4 kg)  07/02/20 131 lb 9.6 oz (59.7 kg)     GEN: Elderly and appears her stated age. No acute distress HEENT: Normal NECK: No JVD. LYMPHATICS: No lymphadenopathy CARDIAC: 3/6 to 4/6 holosystolic murmur of mitral regurgitation along left sternal border, apex, left axilla, back, and towards the base of her heart.  RRR without diastolic murmur, gallop, or edema. VASCULAR:  Normal Pulses. No bruits. RESPIRATORY:  Clear to auscultation without rales, wheezing or rhonchi  ABDOMEN: Soft, non-tender, non-distended, No pulsatile mass, MUSCULOSKELETAL: No deformity  SKIN: Warm and dry NEUROLOGIC:  Alert and oriented x 3 PSYCHIATRIC:  Normal affect   ASSESSMENT:    1. Severe mitral regurgitation   2. Severe pulmonary hypertension ([place])   3. Essential hypertension   4. Osteoarthritis of knee, unspecified laterality, unspecified osteoarthritis type   5. Educated about COVID-19 virus infection    PLAN:    In order of problems listed above:  1. Severe mitral regurgitation related to flail mitral leaflet.  Transesophageal echo, left and right heart cath with coronary angiography will be performed expediently.  She will then be referred to the structural mitral valve clinic as well as Dr. Darylene Price: for consideration of mitral valve therapy. 2. This will be evaluated with left heart cath.  Echo suggested PA systolic pressure around 65 mmHg. 3. She has elevated systolic blood pressure and afterload reduction would be helpful.  We will likely add ARB or ACE inhibitor. 4. Did not discuss 5. She has been vaccinated and does not have COVID-19 disease.   The patient was counseled to undergo left heart catheterization, coronary angiography, and possible percutaneous coronary intervention with stent implantation. The procedural risks and benefits were discussed in detail. The risks discussed included death, stroke,  myocardial infarction, life-threatening bleeding, limb ischemia, kidney injury, allergy, and possible emergency cardiac surgery. The risk of these significant complications were estimated to occur less than 1% of the time. After discussion, the patient has agreed  to proceed.   Described transesophageal echocardiography, the need for sedation, the risk of esophageal injury/perforation, bleeding, among other complications.  Patient understands and accepts these risks.  Medication Adjustments/Labs and Tests Ordered: Current medicines are reviewed at length with the patient today.  Concerns regarding medicines are outlined above.  No orders of the defined types were placed in this encounter.  No orders of the defined types were placed in this encounter.   There are no Patient Instructions on file for this visit.   Signed, Sinclair Grooms, MD  08/27/2020 3:03 PM    [place]

## 2020-08-27 ENCOUNTER — Encounter: Payer: Self-pay | Admitting: Interventional Cardiology

## 2020-08-27 ENCOUNTER — Other Ambulatory Visit: Payer: Self-pay

## 2020-08-27 ENCOUNTER — Ambulatory Visit (INDEPENDENT_AMBULATORY_CARE_PROVIDER_SITE_OTHER): Payer: Medicare Other | Admitting: Interventional Cardiology

## 2020-08-27 VITALS — BP 140/72 | HR 76 | Ht 61.0 in | Wt 130.2 lb

## 2020-08-27 DIAGNOSIS — I340 Nonrheumatic mitral (valve) insufficiency: Secondary | ICD-10-CM | POA: Diagnosis not present

## 2020-08-27 DIAGNOSIS — M1710 Unilateral primary osteoarthritis, unspecified knee: Secondary | ICD-10-CM

## 2020-08-27 DIAGNOSIS — M179 Osteoarthritis of knee, unspecified: Secondary | ICD-10-CM

## 2020-08-27 DIAGNOSIS — I272 Pulmonary hypertension, unspecified: Secondary | ICD-10-CM

## 2020-08-27 DIAGNOSIS — I10 Essential (primary) hypertension: Secondary | ICD-10-CM

## 2020-08-27 DIAGNOSIS — Z7189 Other specified counseling: Secondary | ICD-10-CM

## 2020-08-27 DIAGNOSIS — M25572 Pain in left ankle and joints of left foot: Secondary | ICD-10-CM | POA: Diagnosis not present

## 2020-08-27 NOTE — Patient Instructions (Addendum)
Medication Instructions:  Your physician recommends that you continue on your current medications as directed. Please refer to the Current Medication list given to you today.  *If you need a refill on your cardiac medications before your next appointment, please call your pharmacy*   Lab Work: BMET and CBC today  If you have labs (blood work) drawn today and your tests are completely normal, you will receive your results only by: Marland Kitchen MyChart Message (if you have MyChart) OR . A paper copy in the mail If you have any lab test that is abnormal or we need to change your treatment, we will call you to review the results.   Testing/Procedures: Your physician has requested that you have a TEE. During a TEE, sound waves are used to create images of your heart. It provides your doctor with information about the size and shape of your heart and how well your heart's chambers and valves are working. In this test, a transducer is attached to the end of a flexible tube that's guided down your throat and into your esophagus (the tube leading from you mouth to your stomach) to get a more detailed image of your heart. You are not awake for the procedure. Please see the instruction sheet given to you today. For further information please visit [email or web address].   Your physician has requested that you have a cardiac catheterization. Cardiac catheterization is used to diagnose and/or treat various heart conditions. Doctors may recommend this procedure for a number of different reasons. The most common reason is to evaluate chest pain. Chest pain can be a symptom of coronary artery disease (CAD), and cardiac catheterization can show whether plaque is narrowing or blocking your heart's arteries. This procedure is also used to evaluate the valves, as well as measure the blood flow and oxygen levels in different parts of your heart. For further information please visit [email or web address]. Please follow instruction  sheet, as given.    Follow-Up: At [facility], you and your health needs are our priority.  As part of our continuing mission to provide you with exceptional heart care, we have created designated Provider Care Teams.  These Care Teams include your primary Cardiologist (physician) and Advanced Practice Providers (APPs -  Physician Assistants and Nurse Practitioners) who all work together to provide you with the care you need, when you need it.  We recommend signing up for the patient portal called "MyChart".  Sign up information is provided on this After Visit Summary.  MyChart is used to connect with patients for Virtual Visits (Telemedicine).  Patients are able to view lab/test results, encounter notes, upcoming appointments, etc.  Non-urgent messages can be sent to your provider as well.   To learn more about what you can do with MyChart, go to [email or web address].    Your next appointment:   2-3 week(s)  The format for your next appointment:   In Person  Provider:   You may see Dr. Daneen Schick or one of the following Advanced Practice Providers on your designated Care Team:    Truitt Merle, NP  Cecilie Kicks, NP  Kathyrn Drown, NP    Other Instructions  Due to recent COVID-19 restrictions implemented by our local and state authorities and in an effort to keep both patients and staff as safe as possible, our hospital system requires COVID-19 testing prior to certain scheduled hospital procedures.  Please go to [place]. [place], [place] [address] on Saturday, September 13, 2020 at 12PM  .  This is a drive up testing site.  You will not need to exit your vehicle. You must agree to self-quarantine from the time of your testing until the procedure date on 09/16/2020.  This should included staying home with ONLY the people you live with.  Avoid take-out, grocery store shopping or leaving the house for any non-emergent reason.  Failure to have your COVID-19 test done on the  date and time you have been scheduled will result in cancellation of your procedure.  Please call our office at [phone] if you have any questions.   You are scheduled for a TEE on Tuesday, September 16, 2020 with Dr. Harrell Gave.  Please arrive at the [facility] (Main Entrance A) at [facility]: [address] [place], [place] [address] at 9:30 am.  DIET: Nothing to eat or drink after midnight except a sip of water with medications (see medication instructions below)  Medication Instructions: Hold Furosemide and Potassium the morning of your procedure.   Labs: You will have labs drawn today.  You must have a responsible person to drive you home and stay in the waiting area during your procedure. Failure to do so could result in cancellation.  Bring your insurance cards.  *Special Note: Every effort is made to have your procedure done on time. Occasionally there are emergencies that occur at the hospital that may cause delays. Please be patient if a delay does occur.      [facility] OFFICE [place], [place] [address] Dept: [phone] Loc: [place]  08/27/2020  You are scheduled for a Cardiac Catheterization on Tuesday, November 23 with Dr. Daneen Schick.  1. Please arrive at the [facility] (Main Entrance A) at [facility]: [address] [place], [place] [address] at 9:30 AM (This time is two hours before your procedure to ensure your preparation). Free valet parking service is available.   Special note: Every effort is made to have your procedure done on time. Please understand that emergencies sometimes delay scheduled procedures.  2. Diet: Do not eat solid foods after midnight.  The patient may have clear liquids until 5am upon the day of the procedure.  3. Labs: You will have labs drawn today.  4. Medication instructions in preparation  for your procedure:   Contrast Allergy: No  Hold your Furosemide and Potassium the morning of your procedure.  On the morning of your procedure, take an Aspirin and any morning medicines NOT listed above.  You may use sips of water.  5. Plan for one night stay--bring personal belongings. 6. Bring a current list of your medications and current insurance cards. 7. You MUST have a responsible person to drive you home. 8. Someone MUST be with you the first 24 hours after you arrive home or your discharge will be delayed. 9. Please wear clothes that are easy to get on and off and wear slip-on shoes.  Thank you for allowing [place] to care for you!   -- [facility]

## 2020-08-28 LAB — BASIC METABOLIC PANEL
BUN/Creatinine Ratio: 26 (ref 12–28)
BUN: 18 mg/dL (ref 8–27)
CO2: 28 mmol/L (ref 20–29)
Calcium: 9.2 mg/dL (ref 8.7–10.3)
Chloride: 108 mmol/L — ABNORMAL HIGH (ref 96–106)
Creatinine, Ser: 0.7 mg/dL (ref 0.57–1.00)
GFR calc Af Amer: 93 mL/min/{1.73_m2} (ref >59)
GFR calc non Af Amer: 80 mL/min/{1.73_m2} (ref >59)
Glucose: 120 mg/dL — ABNORMAL HIGH (ref 65–99)
Potassium: 4.7 mmol/L (ref 3.5–5.2)
Sodium: 148 mmol/L — ABNORMAL HIGH (ref 134–144)

## 2020-08-28 LAB — CBC
Hematocrit: 42.8 % (ref 34.0–46.6)
Hemoglobin: 13.6 g/dL (ref 11.1–15.9)
MCH: 28.5 pg (ref 26.6–33.0)
MCHC: 31.8 g/dL (ref 31.5–35.7)
MCV: 90 fL (ref 79–97)
Platelets: 243 10*3/uL (ref 150–450)
RBC: 4.77 x10E6/uL (ref 3.77–5.28)
RDW: 13.5 % (ref 11.7–15.4)
WBC: 7.6 10*3/uL (ref 3.4–10.8)

## 2020-08-29 ENCOUNTER — Encounter: Payer: Self-pay | Admitting: Family Medicine

## 2020-09-01 DIAGNOSIS — M7672 Peroneal tendinitis, left leg: Secondary | ICD-10-CM | POA: Diagnosis not present

## 2020-09-04 DIAGNOSIS — M7672 Peroneal tendinitis, left leg: Secondary | ICD-10-CM | POA: Diagnosis not present

## 2020-09-10 ENCOUNTER — Telehealth: Payer: Self-pay | Admitting: Interventional Cardiology

## 2020-09-10 DIAGNOSIS — I340 Nonrheumatic mitral (valve) insufficiency: Secondary | ICD-10-CM

## 2020-09-10 NOTE — Telephone Encounter (Signed)
Called and spoke to the patient. She is wanting to know why she needs to follow up with Erin Drown, NP on 12/10 after her procedures on 11/23. Made patient aware that we typically arrange post cath f/u after those procedures. Made patient aware that it looks like the plan is for her to be set up with the valve clinic to discuss options for fixing her MV. Made patient aware that I will forward to Dr. Tamala Julian and his RN to see if he would like her to just be seen in the valve clinic/Dr. Roxy [name] after her procedures or if he would like for her to keep her appt with Erin [name] on 12/10. Patient verbalized understanding and understands that she will be called back once we hear back. Patient appreciated the call.

## 2020-09-10 NOTE — Telephone Encounter (Signed)
Thanks Lauren!  Attempted to contact patient to make her aware but there was no answer and VM was full.

## 2020-09-10 NOTE — Telephone Encounter (Signed)
Patient stated that she wants to see Dr. Tamala Julian and not a PA or NP.  She said "I am having a procedure done, and I don't want no nurse to see me." Scheduled to see Glenford Peers on 10/03/20. Told patient that Dr. Tamala Julian did not have any current availability. Wanted to check with a nurse. Please call

## 2020-09-10 NOTE — Telephone Encounter (Signed)
If the cath goes well and no local complications, it would be reasonable for next appointment to be with Mitral Valve Clinic / Dr. Roxy Manns. 12/10 appointment only if questions or complications. I won't need to see until after valve clinic and w/u or possibly after valve repair.

## 2020-09-11 NOTE — Telephone Encounter (Signed)
Spoke with pt and made her aware that I am cancelling her 12/10 appt and Lauren, structural coordinator, will be in contact to get her scheduled with Dr. Burt Knack after the testing is done.  Pt appreciative for call.

## 2020-09-13 ENCOUNTER — Other Ambulatory Visit (HOSPITAL_COMMUNITY)
Admission: RE | Admit: 2020-09-13 | Discharge: 2020-09-13 | Disposition: A | Discharge status: Home / Self Care | Payer: Medicare Other | Source: Ambulatory Visit | Attending: Cardiology | Admitting: Cardiology

## 2020-09-13 DIAGNOSIS — Z20822 Contact with and (suspected) exposure to covid-19: Secondary | ICD-10-CM | POA: Diagnosis not present

## 2020-09-13 DIAGNOSIS — Z01812 Encounter for preprocedural laboratory examination: Secondary | ICD-10-CM | POA: Diagnosis not present

## 2020-09-13 LAB — SARS CORONAVIRUS 2 (TAT 6-24 HRS): SARS Coronavirus 2: NEGATIVE

## 2020-09-15 ENCOUNTER — Telehealth: Payer: Self-pay | Admitting: *Deleted

## 2020-09-15 NOTE — Telephone Encounter (Signed)
Pt contacted pre-catheterization scheduled at [facility] for: Tuesday September 16, 2020 12 Noon Verified arrival time and place: [facility]) at: 9:30 AM/TEE 10:30 AM   Nothing to eat or drink after midnight prior to procedures, may have sips of water to take medications   Hold: Lasix-KCl-AM of procedure  Except hold medications AM meds can be  taken pre-cath with sips of water including: ASA 81 mg   Confirmed patient has responsible adult to drive home post procedure and be with patient first 24 hours after arriving home: yes  You are allowed ONE visitor in the waiting room during the time you are at the hospital for your procedure. Both you and your visitor must wear a mask once you enter the hospital.       COVID-19 Pre-Screening Questions:  . In the past 14 days have you had any symptoms concerning for COVID-19 infection (fever, chills, cough, or new shortness of breath)? no . In the past 14 days have you been around anyone with known Covid 19? no   Reviewed procedure/mask/visitor instructions, COVID-19 questions reviewed with patient.

## 2020-09-16 ENCOUNTER — Encounter (HOSPITAL_COMMUNITY)
Admission: RE | Disposition: A | Discharge status: Home / Self Care | Payer: Self-pay | Source: Home / Self Care | Attending: Cardiology

## 2020-09-16 ENCOUNTER — Ambulatory Visit (HOSPITAL_COMMUNITY): Payer: Medicare Other | Admitting: Certified Registered Nurse Anesthetist

## 2020-09-16 ENCOUNTER — Encounter (HOSPITAL_COMMUNITY): Payer: Self-pay | Admitting: Cardiology

## 2020-09-16 ENCOUNTER — Ambulatory Visit (HOSPITAL_COMMUNITY)
Admission: RE | Admit: 2020-09-16 | Discharge: 2020-09-16 | Disposition: A | Discharge status: Home / Self Care | Payer: Medicare Other | Attending: Cardiology | Admitting: Cardiology

## 2020-09-16 ENCOUNTER — Ambulatory Visit (HOSPITAL_BASED_OUTPATIENT_CLINIC_OR_DEPARTMENT_OTHER): Payer: Medicare Other

## 2020-09-16 DIAGNOSIS — I639 Cerebral infarction, unspecified: Secondary | ICD-10-CM | POA: Diagnosis not present

## 2020-09-16 DIAGNOSIS — I361 Nonrheumatic tricuspid (valve) insufficiency: Secondary | ICD-10-CM | POA: Diagnosis not present

## 2020-09-16 DIAGNOSIS — Z87891 Personal history of nicotine dependence: Secondary | ICD-10-CM | POA: Insufficient documentation

## 2020-09-16 DIAGNOSIS — Z8673 Personal history of transient ischemic attack (TIA), and cerebral infarction without residual deficits: Secondary | ICD-10-CM | POA: Diagnosis present

## 2020-09-16 DIAGNOSIS — Z885 Allergy status to narcotic agent status: Secondary | ICD-10-CM | POA: Diagnosis not present

## 2020-09-16 DIAGNOSIS — I081 Rheumatic disorders of both mitral and tricuspid valves: Secondary | ICD-10-CM | POA: Insufficient documentation

## 2020-09-16 DIAGNOSIS — I272 Pulmonary hypertension, unspecified: Secondary | ICD-10-CM | POA: Diagnosis not present

## 2020-09-16 DIAGNOSIS — I509 Heart failure, unspecified: Secondary | ICD-10-CM | POA: Diagnosis not present

## 2020-09-16 DIAGNOSIS — Z888 Allergy status to other drugs, medicaments and biological substances status: Secondary | ICD-10-CM | POA: Insufficient documentation

## 2020-09-16 DIAGNOSIS — I10 Essential (primary) hypertension: Secondary | ICD-10-CM | POA: Diagnosis present

## 2020-09-16 DIAGNOSIS — I088 Other rheumatic multiple valve diseases: Secondary | ICD-10-CM | POA: Diagnosis not present

## 2020-09-16 DIAGNOSIS — G2581 Restless legs syndrome: Secondary | ICD-10-CM | POA: Diagnosis not present

## 2020-09-16 DIAGNOSIS — I110 Hypertensive heart disease with heart failure: Secondary | ICD-10-CM | POA: Diagnosis not present

## 2020-09-16 DIAGNOSIS — Z79899 Other long term (current) drug therapy: Secondary | ICD-10-CM | POA: Insufficient documentation

## 2020-09-16 DIAGNOSIS — I340 Nonrheumatic mitral (valve) insufficiency: Secondary | ICD-10-CM

## 2020-09-16 HISTORY — PX: RIGHT/LEFT HEART CATH AND CORONARY ANGIOGRAPHY: CATH118266

## 2020-09-16 HISTORY — PX: TEE WITHOUT CARDIOVERSION: SHX5443

## 2020-09-16 LAB — POCT I-STAT EG7
Acid-Base Excess: 2 mmol/L (ref 0.0–2.0)
Bicarbonate: 28.1 mmol/L — ABNORMAL HIGH (ref 20.0–28.0)
Calcium, Ion: 1.22 mmol/L (ref 1.15–1.40)
HCT: 40 % (ref 36.0–46.0)
Hemoglobin: 13.6 g/dL (ref 12.0–15.0)
O2 Saturation: 77 %
Potassium: 3.8 mmol/L (ref 3.5–5.1)
Sodium: 144 mmol/L (ref 135–145)
TCO2: 30 mmol/L (ref 22–32)
pCO2, Ven: 50.3 mmHg (ref 44.0–60.0)
pH, Ven: 7.356 (ref 7.250–7.430)
pO2, Ven: 44 mmHg (ref 32.0–45.0)

## 2020-09-16 LAB — POCT I-STAT 7, (LYTES, BLD GAS, ICA,H+H)
Acid-Base Excess: 2 mmol/L (ref 0.0–2.0)
Bicarbonate: 27.2 mmol/L (ref 20.0–28.0)
Calcium, Ion: 1.19 mmol/L (ref 1.15–1.40)
HCT: 38 % (ref 36.0–46.0)
Hemoglobin: 12.9 g/dL (ref 12.0–15.0)
O2 Saturation: 99 %
Potassium: 3.7 mmol/L (ref 3.5–5.1)
Sodium: 141 mmol/L (ref 135–145)
TCO2: 29 mmol/L (ref 22–32)
pCO2 arterial: 44.7 mmHg (ref 32.0–48.0)
pH, Arterial: 7.393 (ref 7.350–7.450)
pO2, Arterial: 146 mmHg — ABNORMAL HIGH (ref 83.0–108.0)

## 2020-09-16 LAB — ECHO TEE
MV M vel: 5.5 m/s
MV Peak grad: 121 mmHg
Radius: 1.1 cm

## 2020-09-16 SURGERY — ECHOCARDIOGRAM, TRANSESOPHAGEAL
Anesthesia: Monitor Anesthesia Care

## 2020-09-16 SURGERY — RIGHT/LEFT HEART CATH AND CORONARY ANGIOGRAPHY
Anesthesia: LOCAL

## 2020-09-16 MED ORDER — IOHEXOL 350 MG/ML SOLN
INTRAVENOUS | Status: DC | PRN
  Administered 2020-09-16: 55 mL

## 2020-09-16 MED ORDER — SODIUM CHLORIDE 0.9 % IV SOLN: 250.0000 mL | INTRAVENOUS | Status: DC | PRN

## 2020-09-16 MED ORDER — PROPOFOL 10 MG/ML IV BOLUS
INTRAVENOUS | Status: DC | PRN
  Administered 2020-09-16 (×2): 20 mg via INTRAVENOUS
  Administered 2020-09-16 (×2): 10 mg via INTRAVENOUS

## 2020-09-16 MED ORDER — PROPOFOL 500 MG/50ML IV EMUL
INTRAVENOUS | Status: DC | PRN
  Administered 2020-09-16: 75 ug/kg/min via INTRAVENOUS

## 2020-09-16 MED ORDER — HEPARIN SODIUM (PORCINE) 1000 UNIT/ML IJ SOLN
INTRAMUSCULAR | Status: DC | PRN
  Administered 2020-09-16: 3000 [IU] via INTRAVENOUS

## 2020-09-16 MED ORDER — LIDOCAINE HCL (PF) 1 % IJ SOLN
INTRAMUSCULAR | Status: AC
  Filled 2020-09-16: qty 30

## 2020-09-16 MED ORDER — MIDAZOLAM HCL 2 MG/2ML IJ SOLN
INTRAMUSCULAR | Status: DC | PRN
  Administered 2020-09-16: 1 mg via INTRAVENOUS

## 2020-09-16 MED ORDER — VERAPAMIL HCL 2.5 MG/ML IV SOLN
INTRAVENOUS | Status: AC
  Filled 2020-09-16: qty 2

## 2020-09-16 MED ORDER — FENTANYL CITRATE (PF) 100 MCG/2ML IJ SOLN
INTRAMUSCULAR | Status: AC
  Filled 2020-09-16: qty 2

## 2020-09-16 MED ORDER — SODIUM CHLORIDE 0.9 % WEIGHT BASED INFUSION: 3.0000 mL/kg/h | INTRAVENOUS | Status: AC

## 2020-09-16 MED ORDER — SODIUM CHLORIDE 0.9 % IV SOLN: INTRAVENOUS | Status: DC

## 2020-09-16 MED ORDER — SODIUM CHLORIDE 0.9 % WEIGHT BASED INFUSION: 1.0000 mL/kg/h | INTRAVENOUS | Status: DC

## 2020-09-16 MED ORDER — HEPARIN SODIUM (PORCINE) 1000 UNIT/ML IJ SOLN
INTRAMUSCULAR | Status: AC
  Filled 2020-09-16: qty 1

## 2020-09-16 MED ORDER — HEPARIN (PORCINE) IN NACL 1000-0.9 UT/500ML-% IV SOLN
INTRAVENOUS | Status: DC | PRN
  Administered 2020-09-16 (×2): 500 mL

## 2020-09-16 MED ORDER — VERAPAMIL HCL 2.5 MG/ML IV SOLN
INTRAVENOUS | Status: DC | PRN
  Administered 2020-09-16: 10 mL via INTRA_ARTERIAL

## 2020-09-16 MED ORDER — MIDAZOLAM HCL 2 MG/2ML IJ SOLN
INTRAMUSCULAR | Status: AC
  Filled 2020-09-16: qty 2

## 2020-09-16 MED ORDER — HEPARIN (PORCINE) IN NACL 1000-0.9 UT/500ML-% IV SOLN
INTRAVENOUS | Status: AC
  Filled 2020-09-16: qty 500

## 2020-09-16 MED ORDER — LIDOCAINE HCL (PF) 1 % IJ SOLN
INTRAMUSCULAR | Status: DC | PRN
  Administered 2020-09-16: 4 mL

## 2020-09-16 MED ORDER — ASPIRIN 81 MG PO CHEW: 81.0000 mg | CHEWABLE_TABLET | ORAL | Status: DC

## 2020-09-16 MED ORDER — SODIUM CHLORIDE 0.9% FLUSH: 3.0000 mL | INTRAVENOUS | Status: DC | PRN

## 2020-09-16 MED ORDER — LOPERAMIDE HCL 2 MG PO CAPS
2.0000 mg | ORAL_CAPSULE | ORAL | Status: DC | PRN
  Administered 2020-09-16: 2 mg via ORAL
  Filled 2020-09-16 (×2): qty 1

## 2020-09-16 MED ORDER — SODIUM CHLORIDE 0.9% FLUSH: 3.0000 mL | Freq: Two times a day (BID) | INTRAVENOUS | Status: DC

## 2020-09-16 SURGICAL SUPPLY — 12 items
CATH 5FR JL3.5 JR4 ANG PIG MP (CATHETERS) ×1 IMPLANT
CATH SWAN GANZ 7F STRAIGHT (CATHETERS) ×1 IMPLANT
DEVICE RAD COMP TR BAND LRG (VASCULAR PRODUCTS) ×1 IMPLANT
GLIDESHEATH SLEND A-KIT 6F 22G (SHEATH) ×1 IMPLANT
GLIDESHEATH SLENDER 7FR .021G (SHEATH) ×1 IMPLANT
GUIDEWIRE INQWIRE 1.5J.035X260 (WIRE) IMPLANT
INQWIRE 1.5J .035X260CM (WIRE) ×2
KIT HEART LEFT (KITS) ×2 IMPLANT
PACK CARDIAC CATHETERIZATION (CUSTOM PROCEDURE TRAY) ×2 IMPLANT
TRANSDUCER W/STOPCOCK (MISCELLANEOUS) ×2 IMPLANT
TUBING CIL FLEX 10 FLL-RA (TUBING) ×2 IMPLANT
WIRE HI TORQ VERSACORE-J 145CM (WIRE) ×1 IMPLANT

## 2020-09-16 NOTE — CV Procedure (Signed)
   Normal left main, LAD, circumflex, and RCA.  Normal LVEDP 15 mmHg  Mean pulmonary artery pressure 29 mmHg with phasic pressure of 49 over 14 mmHg.  Pulmonary vascular resistance 2.4 Woods units.  Moderate pulmonary hypertension, WHO group 2 etiology.  Pulmonary wedge pressure (mean) 17 mmHg with V wave 41 mmHg  Earlier today, TEE demonstrated severe P2 leaflet flail with severe mitral regurgitation.  Plan referral to mitral valve structural clinic/Dr. Burt Knack for consideration of MitraClip versus open surgical repair.

## 2020-09-16 NOTE — CV Procedure (Signed)
    TRANSESOPHAGEAL ECHOCARDIOGRAM   NAME:  Erin [name]   MRN: [id number] DOB:  [birth year]   ADMIT DATE: 09/16/2020  INDICATIONS: Mitral regurgitation  PROCEDURE:   Informed consent was obtained prior to the procedure. The risks, benefits and alternatives for the procedure were discussed and the patient comprehended these risks.  Risks include, but are not limited to, cough, sore throat, vomiting, nausea, somnolence, esophageal and stomach trauma or perforation, bleeding, low blood pressure, aspiration, pneumonia, infection, trauma to the teeth and death.    Procedural time out performed. The oropharynx was anesthetized with topical 1% cetacaine.    Patient received monitored anesthesia care under the supervision of Dr. Ermalene Postin. Patient received a total of 175 mg propofol during the procedure.  The transesophageal probe was inserted in the esophagus and stomach without difficulty and multiple views were obtained.    COMPLICATIONS:    There were no immediate complications.  FINDINGS:  LEFT VENTRICLE: EF = 60-65%. No regional wall motion abnormalities.  RIGHT VENTRICLE: Normal size and function.   LEFT ATRIUM: No thrombus/mass.  LEFT ATRIAL APPENDAGE: No thrombus/mass.   RIGHT ATRIUM: No thrombus/mass.  AORTIC VALVE:  Trileaflet. Trivial regurgitation. No vegetation.  MITRAL VALVE:    No vegetation. There is partial flail of P2 with chordae attached. There is severe eccentric mitral valve regurgitation directed anteriorly.   TRICUSPID VALVE: Normal structure. At least moderate regurgitation. No vegetation. Angles difficult to assess TR velocity.  PULMONIC VALVE: Grossly normal structure. Trivial regurgitation. No apparent vegetation.  INTERATRIAL SEPTUM: Possible small ASD seen by color Doppler in bicaval views, but not seen well in other views.Marland Kitchen  PERICARDIUM: Trivial effusion noted.  DESCENDING AORTA: Moderate diffuse plaque seen   CONCLUSION: Severe eccentric mitral  valve regurgitation with P2 segment flail with mobile chordae.   Buford Dresser, MD, PhD [facility]  [address], [place], [place] [address] [phone]   10:52 AM

## 2020-09-16 NOTE — Transfer of Care (Signed)
Immediate Anesthesia Transfer of Care Note  Patient: Erin [name]  Procedure(s) Performed: TRANSESOPHAGEAL ECHOCARDIOGRAM (TEE) (N/A )  Patient Location: Endoscopy Unit  Anesthesia Type:MAC  Level of Consciousness: awake, alert  and patient cooperative  Airway & Oxygen Therapy: Patient Spontanous Breathing  Post-op Assessment: Report given to RN and Post -op Vital signs reviewed and stable  Post vital signs: Reviewed and stable  Last Vitals:  Vitals Value Taken Time  BP 132/54 09/16/20 1105  Temp 36.4 C 09/16/20 1105  Pulse 64 09/16/20 1105  Resp 18 09/16/20 1105  SpO2 97 % 09/16/20 1105  Vitals shown include unvalidated device data.  Last Pain:  Vitals:   09/16/20 1105  TempSrc: Temporal  PainSc: 0-No pain         Complications: No complications documented.

## 2020-09-16 NOTE — Anesthesia Preprocedure Evaluation (Signed)
Anesthesia Evaluation  Patient identified by MRN, date of birth, ID band Patient awake    Reviewed: Allergy & Precautions, NPO status , Patient's Chart, lab work & pertinent test results, reviewed documented beta blocker date and time   History of Anesthesia Complications Negative for: history of anesthetic complications  Airway Mallampati: I  TM Distance: >3 FB Neck ROM: Full    Dental  (+) Dental Advisory Given, Teeth Intact   Pulmonary former smoker,  Covid-19 Nucleic Acid Test Results Lab Results      Component                Value               Date                      SARSCOV2NAA              NEGATIVE            09/13/2020              breath sounds clear to auscultation       Cardiovascular hypertension, Pt. on medications and Pt. on home beta blockers (-) angina(-) Past MI and (-) CHF + Valvular Problems/Murmurs MR  Rhythm:Regular + Systolic murmurs 1. Left ventricular ejection fraction, by estimation, is 60 to 65%. The  left ventricle has normal function. The left ventricle has no regional  wall motion abnormalities. There is mild left ventricular hypertrophy.  Left ventricular diastolic parameters  are consistent with Grade II diastolic dysfunction (pseudonormalization).  2. Right ventricular systolic function is normal. The right ventricular  size is normal. There is severely elevated pulmonary artery systolic  pressure. The estimated right ventricular systolic pressure is 00.9 mmHg.  3. Left atrial size was severely dilated.  4. Right atrial size was moderately dilated.  5. The mitral valve is abnormal. There appears to be partial flail of the  P2 segment of the posterior mitral leaflet with possibly a ruptured chord.  Severe mitral valve regurgitation, anteriorly-directed. No evidence of  mitral stenosis.  6. Tricuspid valve regurgitation is moderate to severe.  7. The aortic valve is tricuspid. Aortic valve  regurgitation is trivial.  No aortic stenosis is present.  8. The inferior vena cava is normal in size with <50% respiratory  variability, suggesting right atrial pressure of 8 mmHg.    Neuro/Psych CVA negative psych ROS   GI/Hepatic negative GI ROS, Neg liver ROS,   Endo/Other  negative endocrine ROS  Renal/GU negative Renal ROSLab Results      Component                Value               Date                      CREATININE               0.70                08/27/2020                Musculoskeletal  (+) Arthritis ,   Abdominal   Peds  Hematology negative hematology ROS (+) Lab Results      Component                Value  Date                      WBC                      7.6                 08/27/2020                HGB                      13.6                08/27/2020                HCT                      42.8                08/27/2020                MCV                      90                  08/27/2020                PLT                      243                 08/27/2020              Anesthesia Other Findings   Reproductive/Obstetrics                             Anesthesia Physical Anesthesia Plan  ASA: II  Anesthesia Plan: MAC   Post-op Pain Management:    Induction: Intravenous  PONV Risk Score and Plan: 2 and Propofol infusion and Treatment may vary due to age or medical condition  Airway Management Planned: Nasal Cannula  Additional Equipment: None  Intra-op Plan:   Post-operative Plan:   Informed Consent: I have reviewed the patients History and Physical, chart, labs and discussed the procedure including the risks, benefits and alternatives for the proposed anesthesia with the patient or authorized representative who has indicated his/her understanding and acceptance.     Dental advisory given  Plan Discussed with: CRNA  Anesthesia Plan Comments:         Anesthesia Quick Evaluation

## 2020-09-16 NOTE — Progress Notes (Signed)
  Echocardiogram Echocardiogram Transesophageal has been performed.  Erin [name] 09/16/2020, 11:29 AM

## 2020-09-16 NOTE — Progress Notes (Signed)
Patient was given discharge instructions. She verbalized understanding. 

## 2020-09-16 NOTE — Telephone Encounter (Signed)
Pt scheduled for Structural Heart evaluation on 11/29 at 2:40 PM with Dr Burt Knack. Appointment information given to the patient in the cath lab recovery area.

## 2020-09-16 NOTE — Interval H&P Note (Signed)
Cath Lab Visit (complete for each Cath Lab visit)  Clinical Evaluation Leading to the Procedure:   ACS: No.  Non-ACS:    Anginal Classification: CCS II  Anti-ischemic medical therapy: No Therapy  Non-Invasive Test Results: No non-invasive testing performed  Prior CABG: No previous CABG      History and Physical Interval Note:  09/16/2020 12:27 PM  Erin [name]  has presented today for surgery, with the diagnosis of mitral reg.  The various methods of treatment have been discussed with the patient and family. After consideration of risks, benefits and other options for treatment, the patient has consented to  Procedure(s): RIGHT/LEFT HEART CATH AND CORONARY ANGIOGRAPHY (N/A) as a surgical intervention.  The patient's history has been reviewed, patient examined, no change in status, stable for surgery.  I have reviewed the patient's chart and labs.  Questions were answered to the patient's satisfaction.     Belva Crome III

## 2020-09-16 NOTE — Interval H&P Note (Signed)
History and Physical Interval Note:  09/16/2020 9:50 AM  Erin [name]  has presented today for surgery, with the diagnosis of SEVERE MITRAL REGURGITATION.  The various methods of treatment have been discussed with the patient and family. After consideration of risks, benefits and other options for treatment, the patient has consented to  Procedure(s): TRANSESOPHAGEAL ECHOCARDIOGRAM (TEE) (N/A) as a surgical intervention.  The patient's history has been reviewed, patient examined, no change in status, stable for surgery.  I have reviewed the patient's chart and labs.  Questions were answered to the patient's satisfaction.     Erin [name]

## 2020-09-16 NOTE — Discharge Instructions (Signed)
Drink plenty of fluid for 48 hours and keep wrist elevated at heart level for 24 hours  Radial Site Care   This sheet gives you information about how to care for yourself after your procedure. Your health care provider may also give you more specific instructions. If you have problems or questions, contact your health care provider. What can I expect after the procedure? After the procedure, it is common to have:  Bruising and tenderness at the catheter insertion area. Follow these instructions at home: Medicines  Take over-the-counter and prescription medicines only as told by your health care provider. Insertion site care 1. Follow instructions from your health care provider about how to take care of your insertion site. Make sure you: ? Wash your hands with soap and water before you change your bandage (dressing). If soap and water are not available, use hand sanitizer. ? remove your dressing as told by your health care provider. In 24 hours 2. Check your insertion site every day for signs of infection. Check for: ? Redness, swelling, or pain. ? Fluid or blood. ? Pus or a bad smell. ? Warmth. 3. Do not take baths, swim, or use a hot tub until your health care provider approves. 4. You may shower 24-48 hours after the procedure, or as directed by your health care provider. ? Remove the dressing and gently wash the site with plain soap and water. ? Pat the area dry with a clean towel. ? Do not rub the site. That could cause bleeding. 5. Do not apply powder or lotion to the site. Activity   1. For 24 hours after the procedure, or as directed by your health care provider: ? Do not flex or bend the affected arm. ? Do not push or pull heavy objects with the affected arm. ? Do not drive yourself home from the hospital or clinic. You may drive 24 hours after the procedure unless your health care provider tells you not to. ? Do not operate machinery or power tools. 2. Do not lift  anything that is heavier than 10 lb (4.5 kg), or the limit that you are told, until your health care provider says that it is safe. For 4 days 3. Ask your health care provider when it is okay to: ? Return to work or school. ? Resume usual physical activities or sports. ? Resume sexual activity. General instructions  If the catheter site starts to bleed, raise your arm and put firm pressure on the site. If the bleeding does not stop, get help right away. This is a medical emergency.  If you went home on the same day as your procedure, a responsible adult should be with you for the first 24 hours after you arrive home.  Keep all follow-up visits as told by your health care provider. This is important. Contact a health care provider if:  You have a fever.  You have redness, swelling, or yellow drainage around your insertion site. Get help right away if:  You have unusual pain at the radial site.  The catheter insertion area swells very fast.  The insertion area is bleeding, and the bleeding does not stop when you hold steady pressure on the area.  Your arm or hand becomes pale, cool, tingly, or numb. These symptoms may represent a serious problem that is an emergency. Do not wait to see if the symptoms will go away. Get medical help right away. Call your local emergency services (911 in the [place] Do not   drive yourself to the hospital. Summary  After the procedure, it is common to have bruising and tenderness at the site.  Follow instructions from your health care provider about how to take care of your radial site wound. Check the wound every day for signs of infection.  Do not lift anything that is heavier than 10 lb (4.5 kg), or the limit that you are told, until your health care provider says that it is safe. This information is not intended to replace advice given to you by your health care provider. Make sure you discuss any questions you have with your health care  provider. Document Revised: 11/16/2017 Document Reviewed: 11/16/2017 Elsevier Patient Education  2020 [employer]  

## 2020-09-17 ENCOUNTER — Encounter (HOSPITAL_COMMUNITY): Payer: Self-pay | Admitting: Interventional Cardiology

## 2020-09-22 ENCOUNTER — Ambulatory Visit (INDEPENDENT_AMBULATORY_CARE_PROVIDER_SITE_OTHER): Payer: Medicare Other | Admitting: Cardiovascular Disease

## 2020-09-22 ENCOUNTER — Other Ambulatory Visit: Payer: Self-pay

## 2020-09-22 ENCOUNTER — Encounter (HOSPITAL_COMMUNITY): Payer: Self-pay | Admitting: Cardiology

## 2020-09-22 VITALS — BP 150/80 | HR 68 | Ht 61.0 in | Wt 129.8 lb

## 2020-09-22 DIAGNOSIS — I5032 Chronic diastolic (congestive) heart failure: Secondary | ICD-10-CM

## 2020-09-22 DIAGNOSIS — I340 Nonrheumatic mitral (valve) insufficiency: Secondary | ICD-10-CM

## 2020-09-22 DIAGNOSIS — M25531 Pain in right wrist: Secondary | ICD-10-CM | POA: Diagnosis not present

## 2020-09-22 MED ORDER — FUROSEMIDE 20 MG PO TABS: 20.0000 mg | ORAL_TABLET | ORAL | 0 refills | Status: DC

## 2020-09-22 NOTE — Progress Notes (Signed)
Cardiology Office Note:    Date:  09/22/2020   ID:  Erin [name], DOB 21-Nov-1934, MRN [id number]  PCP:  Erin Sou, MD  [facility] Cardiologist:  No primary care provider on file.  [facility] Electrophysiologist:  None   Referring MD: Erin Sou, MD   Chief Complaint  Patient presents with  . Mitral Regurgitation   History of Present Illness:    Erin [name] is a 85 y.o. female referred by Dr Erin [name] for evaluation of severe mitral regurgitation.   The patent is here alone today. The patient first noticed shortness of breath about 6-8 months ago. She was seen by Dr Erin [name] who ordered a CXR, echo, and other studies. She was started on a diuretic and began to feel better. She had orthopnea, PND, edema, and progressive dyspnea at that time.   The patient has been aware of a heart murmur since her evaluation in April of this year. She does not have a longstanding heart murmur.  The patient denies any past history of shortness of breath until its onset in April of this year.  She currently is taking furosemide on an as-needed basis.  She no longer has orthopnea or PND.  She continues to have exertional dyspnea if walking up stairs or at a brisk pace.  She is able to walk on level ground with no symptoms.  She has not had any further leg swelling or presyncope.  She complains of occasional leg cramping.  Past Medical History:  Diagnosis Date  . Achilles tendon rupture    left, no surgery required  . Hypertension   . Hypertensive retinopathy of both eyes    Dr. Herbert Deaner  . Low back pain   . Lower extremity edema    lasix prn  . OA (osteoarthritis)   . Osteopenia   . Severe mitral regurgitation    2021 transthoracic echo and TEE->cardiology following  . Severe pulmonary hypertension ([place]) 2021   transth echo; moderate by TEE 08/2020  . Systolic murmur 63/10/6008   severe mitral regurge, mod/severe tricuspic regurg    Past Surgical History:  Procedure  Laterality Date  . APPENDECTOMY     85 yrs old  . CHOLECYSTECTOMY    . LUMBAR LAMINECTOMY  2004  . RIGHT/LEFT HEART CATH AND CORONARY ANGIOGRAPHY N/A 09/16/2020   No CAD. Procedure: RIGHT/LEFT HEART CATH AND CORONARY ANGIOGRAPHY;  Surgeon: Belva Crome, MD;  Location: [facility];  Service: Cardiovascular;  Laterality: N/A;  . TEE WITHOUT CARDIOVERSION N/A 09/16/2020   Procedure: TRANSESOPHAGEAL ECHOCARDIOGRAM (TEE);  Surgeon: Buford Dresser, MD;  Location: [facility];  Service: Cardiovascular;  Laterality: N/A;  . TRANSTHORACIC ECHOCARDIOGRAM  07/03/2020   TTE EF 60-65%, grd II DD, severe pulm art HTN, severe mitral valve regurg, mod/sev tricuspic valve regurg. Conf on TEE 08/2020    Current Medications: Current Meds  Medication Sig  . acetaminophen (TYLENOL 8 HOUR) 650 MG CR tablet Take 650 mg by mouth every 8 (eight) hours as needed for pain.  . Ascorbic Acid (VITAMIN C) 1000 MG tablet Take 1,000 mg by mouth daily. As needed  . carvedilol (COREG) 25 MG tablet Take 1 tablet (25 mg total) by mouth daily.  . Cholecalciferol (VITAMIN D3) 1000 UNITS tablet Take 1,000 Units by mouth daily. As needed  . diazepam (VALIUM) 2 MG tablet Take 2 mg by mouth as needed for anxiety.  . furosemide (LASIX) 20 MG tablet Take 1 tablet (20 mg total) by mouth  daily as needed.  . loratadine (CLARITIN) 10 MG tablet TAKE ONE TABLET BY MOUTH EVERY DAY AS NEEDED FOR ALLERGY  . melatonin 5 MG TABS Take 5 mg by mouth at bedtime. As needed  . Multiple Vitamins-Minerals (PRESERVISION AREDS PO) Take 1 tablet by mouth in the morning and at bedtime.   . Multiple Vitamins-Minerals (ZINC PO) Take 50 mg by mouth daily. As needed  . nitroGLYCERIN (NITRODUR - DOSED IN MG/24 HR) 0.2 mg/hr patch 1/2 patch over L Achilles tendon bid  . potassium chloride (KLOR-CON) 10 MEQ tablet Take 10 mEq by mouth as needed. With lasix   . tiZANidine (ZANAFLEX) 4 MG capsule Take 4 mg by mouth as needed for muscle spasms.    Marland Kitchen tretinoin (RETIN-A) 0.1 % cream Apply topically at bedtime.     Allergies:   Barbiturates, Demerol, Hydrochlorothiazide, Irbesartan, Losartan potassium, Olmesartan medoxomil, Procaine, and Telmisartan   Social History   Socioeconomic History  . Marital status: Married    Spouse name: Not on file  . Number of children: Not on file  . Years of education: Not on file  . Highest education level: Not on file  Occupational History  . Not on file  Tobacco Use  . Smoking status: Former Research scientist (life sciences)  . Smokeless tobacco: Never Used  . Tobacco comment: As a teenager  Vaping Use  . Vaping Use: Never used  Substance and Sexual Activity  . Alcohol use: No  . Drug use: Never  . Sexual activity: Never  Other Topics Concern  . Not on file  Social History Narrative   Married, 6 children, about 15 GC.  [address] [place]   Former Network engineer at [employer].  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   No tob.   No alc.   Social Determinants of Health   Financial Resource Strain:   . Difficulty of Paying Living Expenses: Not on file  Food Insecurity:   . Worried About Charity fundraiser in the Last Year: Not on file  . Ran Out of Food in the Last Year: Not on file  Transportation Needs:   . Lack of Transportation (Medical): Not on file  . Lack of Transportation (Non-Medical): Not on file  Physical Activity:   . Days of Exercise per Week: Not on file  . Minutes of Exercise per Session: Not on file  Stress:   . Feeling of Stress : Not on file  Social Connections:   . Frequency of Communication with Friends and Family: Not on file  . Frequency of Social Gatherings with Friends and Family: Not on file  . Attends Religious Services: Not on file  . Active Member of Clubs or Organizations: Not on file  . Attends Archivist Meetings: Not on file  . Marital Status: Not on file     Family History: The patient's family history includes Cancer in her father; Early death in  her father; Hypertension in her mother and another family member.  ROS:   Please see the history of present illness.    Positive for bilateral knee pain and back pain. All other systems reviewed and are negative.  EKGs/Labs/Other Studies Reviewed:    The following studies were reviewed today: Cardiac Catheterization: Conclusion   Normal coronary arteries with right dominant anatomy.  Mild pulmonary hypertension with mean pulmonary artery pressure 29 mmHg.  Pulmonary capillary wedge mean 17 mmHg and left ventricular end-diastolic pressure 17 mmHg.  Pulmonary  vascular resistance 2.39 [place] units.  WHO group 2 etiology.  Severe mitral regurgitation documented by TEE and confirmed by hemodynamic recordings with V waves spiking to 41 mmHg.  Left ventricular systolic function is poorly assessed by the current study.  RECOMMENDATIONS:   Referred to structural heart team for consideration of MitraClip versus surgical repair.  Upcoming appointment with Dr. Burt Knack 08/22/2020.  Echo 07/03/2020: IMPRESSIONS    1. Left ventricular ejection fraction, by estimation, is 60 to 65%. The  left ventricle has normal function. The left ventricle has no regional  wall motion abnormalities. There is mild left ventricular hypertrophy.  Left ventricular diastolic parameters  are consistent with Grade II diastolic dysfunction (pseudonormalization).  2. Right ventricular systolic function is normal. The right ventricular  size is normal. There is severely elevated pulmonary artery systolic  pressure. The estimated right ventricular systolic pressure is 04.5 mmHg.  3. Left atrial size was severely dilated.  4. Right atrial size was moderately dilated.  5. The mitral valve is abnormal. There appears to be partial flail of the  P2 segment of the posterior mitral leaflet with possibly a ruptured chord.  Severe mitral valve regurgitation, anteriorly-directed. No evidence of  mitral stenosis.  6.  Tricuspid valve regurgitation is moderate to severe.  7. The aortic valve is tricuspid. Aortic valve regurgitation is trivial.  No aortic stenosis is present.  8. The inferior vena cava is normal in size with <50% respiratory  variability, suggesting right atrial pressure of 8 mmHg.   TEE 09/16/2020: IMPRESSIONS    1. Left ventricular ejection fraction, by estimation, is 60 to 65%. The  left ventricle has normal function.  2. Right ventricular systolic function is normal. The right ventricular  size is normal.  3. No left atrial/left atrial appendage thrombus was detected.  4. There is partial flail of P2 with chordae attached. There is severe  eccentric mitral valve regurgitation directed anteriorly. . The mitral  valve is abnormal. Severe mitral valve regurgitation. No evidence of  mitral stenosis.  5. Tricuspid valve regurgitation is moderate to severe.  6. The aortic valve is tricuspid. Aortic valve regurgitation is trivial.  No aortic stenosis is present.  7. There is Moderate (Grade III) plaque involving the descending aorta.  8. Possible shunting.   Conclusion(s)/Recommendation(s): Severe eccentric mitral valve  regurgitation with P2 segment flail with mobile chordae.   EKG:  EKG is not ordered today.    Recent Labs: 01/30/2020: Pro B Natriuretic peptide (BNP) 475.0 07/02/2020: ALT 12; Magnesium 1.9; TSH 2.37 08/27/2020: BUN 18; Creatinine, Ser 0.70; Platelets 243 09/16/2020: Hemoglobin 12.9; Potassium 3.7; Sodium 141  Recent Lipid Panel    Component Value Date/Time   CHOL 139 07/02/2020 1057   TRIG 30.0 07/02/2020 1057   TRIG 33 09/21/2006 1115   HDL 80.30 07/02/2020 1057   CHOLHDL 2 07/02/2020 1057   VLDL 6.0 07/02/2020 1057   LDLCALC 53 07/02/2020 1057     Risk Assessment/Calculations:     Physical Exam:    VS:  BP (!) 150/80   Pulse 68   Ht 5\' 1"  (1.549 m)   Wt 129 lb 12.8 oz (58.9 kg)   SpO2 96%   BMI 24.53 kg/m     Wt Readings from Last 3  Encounters:  09/22/20 129 lb 12.8 oz (58.9 kg)  09/16/20 123 lb (55.8 kg)  08/27/20 130 lb 3.2 oz (59.1 kg)    GEN:  Well nourished, well developed in no acute distress HEENT: Normal NECK: No  JVD; No carotid bruits LYMPHATICS: No lymphadenopathy CARDIAC: RRR, 3/6 holosystolic murmur at the apex/l;eft sternal border RESPIRATORY:  Clear to auscultation without rales, wheezing or rhonchi  ABDOMEN: Soft, non-tender, non-distended MUSCULOSKELETAL:  No edema; No deformity  SKIN: Warm and dry NEUROLOGIC:  Alert and oriented x 3 PSYCHIATRIC:  Normal affect   STS Risk Calculator: Mitral Valve Repair: Risk of Mortality: 3.596% Renal Failure: 1.103% Permanent Stroke: 4.179% Prolonged Ventilation: 7.198% DSW Infection: 0.031% Reoperation: 3.045% Morbidity or Mortality: 11.912% Short Length of Stay: 31.670% Long Length of Stay: 5.620%  Mitral Valve Replacement: Risk of Mortality: 5.750% Renal Failure: 1.861% Permanent Stroke: 2.622% Prolonged Ventilation: 12.096% DSW Infection: 0.059% Reoperation: 5.795% Morbidity or Mortality: 19.655% Short Length of Stay: 18.426% Long Length of Stay: 10.116%  ASSESSMENT:    1. Severe mitral regurgitation   2. Chronic diastolic heart failure (HCC)    PLAN:    In order of problems listed above:  1. The patient has severe primary mitral regurgitation and New York Heart Association functional class II symptoms of chronic dyspnea/diastolic heart failure.  I have personally reviewed her echo study and TEE, both of which demonstrate severe eccentric mitral regurgitation secondary to flail segment of P2 with ruptured primary chordae tendinae.  We discussed the natural history of severe symptomatic mitral regurgitation in the context of the patient's comorbid medical conditions.  She is a functionally independent 85 year old woman with a relative paucity of medical problems.  She has limitation from osteoarthritis of the knees and back,  mild pulmonary hypertension, and essential hypertension that is well controlled on antihypertensive medical therapy.  We discussed treatment options of medical therapy, surgical intervention likely with minimally invasive mitral valve repair, and transcatheter edge-to-edge mitral valve repair with MitraClip.  I reviewed the MitraClip procedure with her in detail and discussed specific risks including vascular injury, infection, stroke, myocardial infarction, device embolization, single leaflet device attachment, valve injury, hemopericardium, need for emergency surgery, and death.  She understands these risks occur at low incidence with a risk of serious complication approximately 2%.  The patient will be referred for formal cardiac surgical consultation with Dr. Roxy Manns.  We will discuss her case as a multidisciplinary team to determine her best treatment option.  I suspect she might be a reasonable candidate for either minimally invasive mitral valve surgery or transcatheter edge-to-edge mitral valve repair.  In the meantime, with reviewing her intracardiac hemodynamics and large V waves at catheterization, she will increase her furosemide to 20 mg 3 days/week and potassium supplement also to 3 days weekly.  No other changes are made in her medical regimen today.  Shared Decision Making/Informed Consent      Medication Adjustments/Labs and Tests Ordered: Current medicines are reviewed at length with the patient today.  Concerns regarding medicines are outlined above.  No orders of the defined types were placed in this encounter.  No orders of the defined types were placed in this encounter.   There are no Patient Instructions on file for this visit.   Signed, Sherren Mocha, MD  09/22/2020 3:15 PM    [place]

## 2020-09-22 NOTE — Patient Instructions (Signed)
Medication Instructions:  1) INCREASE LASIX to 20 mg on Mondays, Wednesdays, and Fridays *If you need a refill on your cardiac medications before your next appointment, please call your pharmacy*  Testing/Procedures: Dr. Burt Knack recommends you have an ultrasound of your wrist.  Follow-Up: You will be called to arrange an appointment with Dr. Roxy Manns.

## 2020-09-23 ENCOUNTER — Ambulatory Visit (HOSPITAL_COMMUNITY)
Admission: RE | Admit: 2020-09-23 | Discharge: 2020-09-23 | Disposition: A | Discharge status: Home / Self Care | Payer: Medicare Other | Source: Ambulatory Visit | Attending: Internal Medicine | Admitting: Internal Medicine

## 2020-09-23 ENCOUNTER — Other Ambulatory Visit: Payer: Self-pay | Admitting: Cardiovascular Disease

## 2020-09-23 DIAGNOSIS — M25531 Pain in right wrist: Secondary | ICD-10-CM | POA: Insufficient documentation

## 2020-09-23 DIAGNOSIS — M79631 Pain in right forearm: Secondary | ICD-10-CM

## 2020-09-23 NOTE — Anesthesia Postprocedure Evaluation (Signed)
Anesthesia Post Note  Patient: Erin [name]  Procedure(s) Performed: TRANSESOPHAGEAL ECHOCARDIOGRAM (TEE) (N/A )     Patient location during evaluation: Endoscopy Anesthesia Type: MAC Level of consciousness: awake and alert Pain management: pain level controlled Vital Signs Assessment: post-procedure vital signs reviewed and stable Respiratory status: spontaneous breathing, nonlabored ventilation, respiratory function stable and patient connected to nasal cannula oxygen Cardiovascular status: stable Postop Assessment: no apparent nausea or vomiting Anesthetic complications: no   No complications documented.  Last Vitals:  Vitals:   09/16/20 1505 09/16/20 1530  BP: (!) 172/99 (!) 150/105  Pulse: 71 61  Resp:    Temp:    SpO2: 97% 96%    Last Pain:  Vitals:   09/16/20 1338  TempSrc:   PainSc: 0-No pain                 Elvis Laufer

## 2020-09-30 ENCOUNTER — Telehealth: Payer: Self-pay

## 2020-09-30 NOTE — Telephone Encounter (Signed)
-----   Message from Sherren Mocha, MD sent at 09/28/2020  9:02 AM EST ----- Reviewed. No pseudoaneurysm.

## 2020-09-30 NOTE — Telephone Encounter (Signed)
I spoke with the pt and she complains of worsening SOB since her evaluation with Dr Burt Knack. Appointment with Dr Roxy Manns has been moved up to 10/02/20 at 3:00 PM. Pt was grateful for assistance in getting an earlier appointment.

## 2020-09-30 NOTE — Telephone Encounter (Signed)
Reviewed results with patient who verbalized understanding.    The patient reports worsening SOB and would now not like to wait until February for intervention if possible.  She understands she will be called to arrange a sooner appointment with Dr. Roxy Manns if it becomes available.  She will seek emergent medical attention if breathing becomes distressed. She was grateful for assistance.

## 2020-10-02 ENCOUNTER — Other Ambulatory Visit: Payer: Self-pay | Admitting: Thoracic Surgery (Cardiothoracic Vascular Surgery)

## 2020-10-02 ENCOUNTER — Encounter: Payer: Self-pay | Admitting: Thoracic Surgery (Cardiothoracic Vascular Surgery)

## 2020-10-02 ENCOUNTER — Other Ambulatory Visit: Payer: Self-pay

## 2020-10-02 ENCOUNTER — Institutional Professional Consult (permissible substitution) (INDEPENDENT_AMBULATORY_CARE_PROVIDER_SITE_OTHER): Payer: Medicare Other | Admitting: Thoracic Surgery (Cardiothoracic Vascular Surgery)

## 2020-10-02 VITALS — BP 137/83 | HR 61 | Resp 20 | Ht 61.0 in | Wt 127.0 lb

## 2020-10-02 DIAGNOSIS — I340 Nonrheumatic mitral (valve) insufficiency: Secondary | ICD-10-CM

## 2020-10-02 NOTE — Patient Instructions (Addendum)
Continue all previous medications without any changes at this time  Schedule CT angiogram as soon as convenient  Please consider getting vaccinated for COVID19

## 2020-10-02 NOTE — Progress Notes (Signed)
HEART AND [place] SURGERY CONSULTATION REPORT  Referring Provider is Belva Crome, MD PCP is McGowen, Adrian Blackwater, MD  Chief Complaint  Patient presents with  . Mitral Regurgitation    TEE/Cath 09/16/20  . Consult    Initial surgical consult    HPI:  Patient is an 85 year old female with history of chronic diastolic congestive heart failure, hypertension and osteoarthritis who has been referred for surgical consultation to discuss treatment options for management of recently discovered mitral valve prolapse with severe symptomatic mitral regurgitation.  Patient has longstanding history of hypertension but is otherwise remained reasonably healthy and functionally independent until recently.  Patient states that last spring she first began to experience worsening symptoms of exertional shortness of breath and orthopnea.  She was started on an oral diuretic by her primary care physician and noted some degree of symptomatic improvement.  However, over the following 6 months she experienced progressive symptoms of exertional shortness of breath.  Transthoracic echocardiogram was performed July 03, 2020 revealing normal left ventricular systolic function with ejection fraction estimated 60 to 65%.  There was mitral valve prolapse with what appeared to be partially flail segment of the middle scallop of the posterior leaflet causing severe mitral regurgitation.  She was referred for formal cardiology evaluation and initially seen by Dr. Tamala Julian August 27, 2020.  TEE and diagnostic cardiac catheterization were performed September 16, 2020.  TEE confirmed the presence of myxomatous degenerative disease with partially flail segment involving the middle scallop (P2) of the posterior leaflet and severe mitral regurgitation.  Left ventricular systolic function was reported to be normal with ejection fraction estimated 60 to 65%.  Right  ventricular size and systolic function was reportedly normal.  There was felt to be moderate to severe tricuspid regurgitation.  Left and right heart catheterization was notable for the presence of normal coronary artery anatomy with no significant coronary artery disease.  There was mild pulmonary hypertension with large V waves on wedge tracing consistent with severe mitral regurgitation.  Patient was referred to the multidisciplinary heart valve clinic and has been evaluated previously by Dr. Burt Knack.  Surgical consultation was requested.  Patient is married and lives locally in [place] with her husband.  They have numerous adult children, several of whom live close by.  The patient has been retired for many years but reports that she remains functionally independent and reasonably active.  She does complain that she has really had to slow down considerably since last April.  She does still drive an automobile and 10 to ordinary chores around the house.  She complains that she gets short of breath with moderate level activity.  Going up a flight of stairs causes her to breathe quite hard.  She also has orthopnea and frequently has to get up in the middle the night and go sit on the couch and able to fall back to sleep.  She has had some mild lower extremity edema that has been improved on diuretic therapy.  She reports some tightness across her chest with exertion.  She has not had dizzy spells or syncope.  She has occasional palpitations.  Her physical mobility is slightly limited due to significant arthritis in her left knee.  She also has some issues with chronic low back pain, but overall she remains reasonably mobile and walks without much limitation.  The patient denies any recent respiratory illness, fever, or productive cough.  She denies any known exposure to  persons with COVID-19 infection.  The patient has not been vaccinated against the COVID-19 virus.  Past Medical History:  Diagnosis Date  .  Achilles tendon rupture    left, no surgery required  . Hypertension   . Hypertensive retinopathy of both eyes    Dr. Herbert Deaner  . Low back pain   . Lower extremity edema    lasix prn  . OA (osteoarthritis)   . Osteopenia   . Severe mitral regurgitation    2021 transthoracic echo and TEE->cardiology following  . Severe pulmonary hypertension ([place]) 2021   transth echo; moderate by TEE 08/2020  . Systolic murmur 44/10/270   severe mitral regurge, mod/severe tricuspic regurg    Past Surgical History:  Procedure Laterality Date  . APPENDECTOMY     85 yrs old  . CHOLECYSTECTOMY    . LUMBAR LAMINECTOMY  2004  . RIGHT/LEFT HEART CATH AND CORONARY ANGIOGRAPHY N/A 09/16/2020   No CAD. Procedure: RIGHT/LEFT HEART CATH AND CORONARY ANGIOGRAPHY;  Surgeon: Belva Crome, MD;  Location: [facility];  Service: Cardiovascular;  Laterality: N/A;  . TEE WITHOUT CARDIOVERSION N/A 09/16/2020   Procedure: TRANSESOPHAGEAL ECHOCARDIOGRAM (TEE);  Surgeon: Buford Dresser, MD;  Location: [facility];  Service: Cardiovascular;  Laterality: N/A;  . TRANSTHORACIC ECHOCARDIOGRAM  07/03/2020   TTE EF 60-65%, grd II DD, severe pulm art HTN, severe mitral valve regurg, mod/sev tricuspic valve regurg. Conf on TEE 08/2020    Family History  Problem Relation Age of Onset  . Hypertension Mother   . Early death Father   . Cancer Father   . Hypertension Other     Social History   Socioeconomic History  . Marital status: Married    Spouse name: Not on file  . Number of children: Not on file  . Years of education: Not on file  . Highest education level: Not on file  Occupational History  . Not on file  Tobacco Use  . Smoking status: Former Research scientist (life sciences)  . Smokeless tobacco: Never Used  . Tobacco comment: As a teenager  Vaping Use  . Vaping Use: Never used  Substance and Sexual Activity  . Alcohol use: No  . Drug use: Never  . Sexual activity: Never  Other Topics Concern  . Not on file   Social History Narrative   Married, 6 children, about 15 GC.  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   No tob.   No alc.   Social Determinants of Health   Financial Resource Strain: Not on file  Food Insecurity: Not on file  Transportation Needs: Not on file  Physical Activity: Not on file  Stress: Not on file  Social Connections: Not on file  Intimate Partner Violence: Not on file    Current Outpatient Medications  Medication Sig Dispense Refill  . acetaminophen (TYLENOL) 650 MG CR tablet Take 650 mg by mouth every 8 (eight) hours as needed for pain.    . Ascorbic Acid (VITAMIN C) 1000 MG tablet Take 1,000 mg by mouth daily. As needed    . carvedilol (COREG) 25 MG tablet Take 1 tablet (25 mg total) by mouth daily. 90 tablet 3  . Cholecalciferol (VITAMIN D3) 1000 UNITS tablet Take 1,000 Units by mouth daily. As needed    . diazepam (VALIUM) 2 MG tablet Take 2 mg by mouth as needed for anxiety.    . furosemide (  LASIX) 20 MG tablet Take 1 tablet (20 mg total) by mouth every Monday, Wednesday, and Friday. 36 tablet 0  . loratadine (CLARITIN) 10 MG tablet TAKE ONE TABLET BY MOUTH EVERY DAY AS NEEDED FOR ALLERGY 90 tablet 3  . melatonin 5 MG TABS Take 5 mg by mouth at bedtime. As needed    . Multiple Vitamins-Minerals (PRESERVISION AREDS PO) Take 1 tablet by mouth in the morning and at bedtime.     . Multiple Vitamins-Minerals (ZINC PO) Take 50 mg by mouth daily. As needed    . nitroGLYCERIN (NITRODUR - DOSED IN MG/24 HR) 0.2 mg/hr patch 1/2 patch over L Achilles tendon bid 30 patch 12  . potassium chloride (KLOR-CON) 10 MEQ tablet Take 10 mEq by mouth as needed. With lasix     . tiZANidine (ZANAFLEX) 4 MG capsule Take 4 mg by mouth as needed for muscle spasms.    Marland Kitchen tretinoin (RETIN-A) 0.1 % cream Apply topically at bedtime. 45 g 3   No current facility-administered medications for this visit.    Allergies   Allergen Reactions  . Barbiturates Other (See Comments)    unknown  . Demerol Other (See Comments)    sick  . Hydrochlorothiazide Other (See Comments)    unknown  . Irbesartan     REACTION: HA  . Losartan Potassium     REACTION: hair loss  . Olmesartan Medoxomil     REACTION: hair loss  . Procaine Other (See Comments)    unknown  . Telmisartan     REACTION: cramps      Review of Systems:   General:  normal appetite, normal energy, no weight gain, no weight loss, no fever  Cardiac:  + chest pain with exertion, + chest pain at rest, +SOB with exertion, occasional resting SOB, + PND, + orthopnea, + palpitations, no arrhythmia, no atrial fibrillation, occasional mild LE edema, no dizzy spells, no syncope  Respiratory:  + shortness of breath, no home oxygen, no productive cough, + chronic dry cough, no bronchitis, no wheezing, no hemoptysis, no asthma, no pain with inspiration or cough, no sleep apnea, no CPAP at night  GI:   + some difficulty swallowing, no reflux, no frequent heartburn, no hiatal hernia, no abdominal pain, no constipation, no diarrhea, no hematochezia, no hematemesis, no melena  GU:   no dysuria,  no frequency, + recent urinary tract infection, no hematuria, no kidney stones, no kidney disease  Vascular:  no pain suggestive of claudication, no pain in feet, + occasional leg cramps, no varicose veins, no DVT, no non-healing foot ulcer  Neuro:   no stroke, no TIA's, no seizures, no headaches, no temporary blindness one eye,  no slurred speech, no peripheral neuropathy, no chronic pain, no instability of gait, no memory/cognitive dysfunction  Musculoskeletal: + arthritis particularly left knee and lower back, + joint swelling, no myalgias, mild difficulty walking, normal mobility   Skin:   no rash, no itching, no skin infections, no pressure sores or ulcerations  Psych:   no anxiety, no depression, no nervousness, no unusual recent stress  Eyes:   no blurry vision, no  floaters, no recent vision changes, + wears glasses or contacts  ENT:   + hearing loss, no loose or painful teeth, no dentures, last saw dentist within the past year  Hematologic:  no easy bruising, no abnormal bleeding, no clotting disorder, no frequent epistaxis  Endocrine:  no diabetes, does not check CBG's at home  Physical Exam:   BP 137/83 (BP Location: Right Arm, Patient Position: Sitting)   Pulse 61   Resp 20   Ht 5\' 1"  (1.549 m)   Wt 127 lb (57.6 kg)   SpO2 94% Comment: RA with mask on  BMI 24.00 kg/m   General:  Elderly,  well-appearing  HEENT:  Unremarkable   Neck:   no JVD, no bruits, no adenopathy   Chest:   clear to auscultation, symmetrical breath sounds, no wheezes, no rhonchi   CV:   RRR, grade III/VI holosystolic murmur heard best at apex,  no diastolic murmur  Abdomen:  soft, non-tender, no masses   Extremities:  warm, well-perfused, pulses slightly diminished but palpable, no LE edema  Rectal/GU  Deferred  Neuro:   Grossly non-focal and symmetrical throughout  Skin:   Clean and dry, frail and thin, no rashes, no breakdown   Diagnostic Tests:   ECHOCARDIOGRAM REPORT       Patient Name:  Erin [name] [facility] Date of Exam: 07/03/2020  Medical Rec #: [id number]    Height:    61.0 in  Accession #:  [id number]   Weight:    131.6 lb  Date of Birth: 01-11-1935    BSA:     1.581 m  Patient Age:  74 years    BP:      120/55 mmHg  Patient Gender: F        HR:      67 bpm.  Exam Location: [address]   Procedure: 2D Echo, Cardiac Doppler and Color Doppler   Indications:  R01.1 Murmur    History:    Patient has no prior history of Echocardiogram  examinations.         Stroke, Signs/Symptoms:Chest Pain; Risk  Factors:Hypertension.         Irregular cardiac rhythm. Lower extremity edema. Fatigue.  Low         back pain.    Sonographer:  Diamond Nickel RCS   Referring Phys: [place]    1. Left ventricular ejection fraction, by estimation, is 60 to 65%. The  left ventricle has normal function. The left ventricle has no regional  wall motion abnormalities. There is mild left ventricular hypertrophy.  Left ventricular diastolic parameters  are consistent with Grade II diastolic dysfunction (pseudonormalization).  2. Right ventricular systolic function is normal. The right ventricular  size is normal. There is severely elevated pulmonary artery systolic  pressure. The estimated right ventricular systolic pressure is 06.2 mmHg.  3. Left atrial size was severely dilated.  4. Right atrial size was moderately dilated.  5. The mitral valve is abnormal. There appears to be partial flail of the  P2 segment of the posterior mitral leaflet with possibly a ruptured chord.  Severe mitral valve regurgitation, anteriorly-directed. No evidence of  mitral stenosis.  6. Tricuspid valve regurgitation is moderate to severe.  7. The aortic valve is tricuspid. Aortic valve regurgitation is trivial.  No aortic stenosis is present.  8. The inferior vena cava is normal in size with <50% respiratory  variability, suggesting right atrial pressure of 8 mmHg.   FINDINGS  Left Ventricle: Left ventricular ejection fraction, by estimation, is 60  to 65%. The left ventricle has normal function. The left ventricle has no  regional wall motion abnormalities. The left ventricular internal cavity  size was normal in size. There is  mild left ventricular hypertrophy. Left ventricular diastolic parameters  are consistent with Grade II  diastolic dysfunction (pseudonormalization).   Right Ventricle: The right ventricular size is normal. No increase in  right ventricular wall thickness. Right ventricular systolic function is  normal. There is severely elevated pulmonary artery systolic pressure. The  tricuspid regurgitant velocity is   3.62 m/s, and with an assumed right atrial pressure of 8 mmHg, the  estimated right ventricular systolic pressure is 09.3 mmHg.   Left Atrium: Left atrial size was severely dilated.   Right Atrium: Right atrial size was moderately dilated.   Pericardium: Trivial pericardial effusion is present.   Mitral Valve: The mitral valve is abnormal. Severe mitral valve  regurgitation. No evidence of mitral valve stenosis.   Tricuspid Valve: The tricuspid valve is normal in structure. Tricuspid  valve regurgitation is moderate to severe.   Aortic Valve: The aortic valve is tricuspid. Aortic valve regurgitation is  trivial. No aortic stenosis is present.   Pulmonic Valve: The pulmonic valve was normal in structure. Pulmonic valve  regurgitation is trivial.   Aorta: The aortic root is normal in size and structure.   Venous: The inferior vena cava is normal in size with less than 50%  respiratory variability, suggesting right atrial pressure of 8 mmHg.   IAS/Shunts: No atrial level shunt detected by color flow Doppler.     LEFT VENTRICLE  PLAX 2D  LVIDd:     4.50 cm Diastology  LVIDs:     2.70 cm LV e' medial:  5.51 cm/s  LV PW:     1.20 cm LV E/e' medial: 27.6  LV IVS:    1.30 cm LV e' lateral:  5.83 cm/s  LVOT diam:   1.95 cm LV E/e' lateral: 26.1  LV SV:     49  LV SV Index:  31  LVOT Area:   2.99 cm     RIGHT VENTRICLE  RV Basal diam: 2.70 cm  RV S prime:   10.05 cm/s  TAPSE (M-mode): 2.7 cm  RVSP:      60.4 mmHg   LEFT ATRIUM       Index    RIGHT ATRIUM      Index  LA diam:    4.30 cm 2.72 cm/m RA Pressure: 8.00 mmHg  LA Vol (A2C):  72.7 ml 45.98 ml/m RA Area:   18.80 cm  LA Vol (A4C):  97.3 ml 61.54 ml/m RA Volume:  47.10 ml 29.79 ml/m  LA Biplane Vol: 85.1 ml 53.83 ml/m  AORTIC VALVE  LVOT Vmax:  74.43 cm/s  LVOT Vmean: 46.267 cm/s  LVOT VTI:  0.165 m    AORTA  Ao Root diam: 2.70 cm    MITRAL VALVE         TRICUSPID VALVE  MV Area (PHT): 3.38 cm   TR Peak grad:  52.4 mmHg  MV Decel Time: 224 msec   TR Vmax:    362.00 cm/s  MR Peak grad:  116.5 mmHg Estimated RAP: 8.00 mmHg  MR Mean grad:  74.3 mmHg  RVSP:      60.4 mmHg  MR Vmax:     539.67 cm/s  MR Vmean:    408.7 cm/s SHUNTS  MR PISA:     5.09 cm  Systemic VTI: 0.17 m  MR PISA Eff ROA: 36 mm   Systemic Diam: 1.95 cm  MR PISA Radius: 0.90 cm  MV E velocity: 152.33 cm/s  MV A velocity: 73.87 cm/s  MV E/A ratio: 2.06   Loralie Champagne MD  Electronically signed by Loralie Champagne MD  Signature Date/Time: 07/03/2020/2:43:38 PM       TRANSESOPHOGEAL ECHO REPORT       Patient Name:  JOYDAN GRETZINGER [facility] Date of Exam: 09/16/2020  Medical Rec #: [id number]    Height:    61.0 in  Accession #:  [id number]   Weight:    123.0 lb  Date of Birth: May 26, 1935    BSA:     1.536 m  Patient Age:  78 years    BP:      132/76 mmHg  Patient Gender: F        HR:      58 bpm.  Exam Location: Inpatient   Procedure: Transesophageal Echo, Cardiac Doppler and Color Doppler   Indications:   Mitral regurgitation    History:     Patient has prior history of Echocardiogram examinations,  most          recent 07/03/2020. Risk Factors:Hypertension and Former  Smoker.    Sonographer:   Clayton Lefort RDCS (AE)  Referring Phys: [id number] [place]  Diagnosing Phys: Buford Dresser MD   PROCEDURE: After discussion of the risks and benefits of a TEE, an  informed consent was obtained from the patient. The transesophogeal probe  was passed without difficulty through the esophogus of the patient. Local  oropharyngeal anesthetic was provided  with Cetacaine. Sedation performed by different physician. The patient was  monitored while under deep sedation. Anesthestetic sedation was provided  intravenously by  Anesthesiology: 174.67mg  of Propofol. Image quality was  adequate. The patient developed  no complications during the procedure.   IMPRESSIONS    1. Left ventricular ejection fraction, by estimation, is 60 to 65%. The  left ventricle has normal function.  2. Right ventricular systolic function is normal. The right ventricular  size is normal.  3. No left atrial/left atrial appendage thrombus was detected.  4. There is partial flail of P2 with chordae attached. There is severe  eccentric mitral valve regurgitation directed anteriorly. . The mitral  valve is abnormal. Severe mitral valve regurgitation. No evidence of  mitral stenosis.  5. Tricuspid valve regurgitation is moderate to severe.  6. The aortic valve is tricuspid. Aortic valve regurgitation is trivial.  No aortic stenosis is present.  7. There is Moderate (Grade III) plaque involving the descending aorta.  8. Possible shunting.   Conclusion(s)/Recommendation(s): Severe eccentric mitral valve  regurgitation with P2 segment flail with mobile chordae.   FINDINGS  Left Ventricle: Left ventricular ejection fraction, by estimation, is 60  to 65%. The left ventricle has normal function. The left ventricular  internal cavity size was normal in size.   Right Ventricle: The right ventricular size is normal. No increase in  right ventricular wall thickness. Right ventricular systolic function is  normal.   Left Atrium: Left atrial size was not assessed. No left atrial/left atrial  appendage thrombus was detected.   Right Atrium: Right atrial size was not assessed.   Pericardium: Trivial pericardial effusion is present.   Mitral Valve: There is partial flail of P2 with chordae attached. There is  severe eccentric mitral valve regurgitation directed anteriorly. The  mitral valve is abnormal. Severe mitral valve regurgitation. No evidence  of mitral valve stenosis. There is no  evidence of mitral valve vegetation.    Tricuspid Valve: The tricuspid valve is normal in structure. Tricuspid  valve regurgitation is moderate to severe. No evidence of tricuspid  stenosis. There is no evidence of tricuspid valve vegetation.   Aortic Valve: The aortic  valve is tricuspid. Aortic valve regurgitation is  trivial. No aortic stenosis is present.   Pulmonic Valve: The pulmonic valve was grossly normal. Pulmonic valve  regurgitation is trivial. No evidence of pulmonic stenosis. There is no  evidence of pulmonic valve vegetation.   Aorta: The aortic root and ascending aorta are structurally normal, with  no evidence of dilitation. There is moderate (Grade III) plaque involving  the descending aorta.   IAS/Shunts: Possible shunting.     MR Peak grad:  121.0 mmHg  MR Mean grad:  73.5 mmHg  MR Vmax:     550.00 cm/s  MR Vmean:    400.5 cm/s  MR PISA:     7.60 cm  MR PISA Eff ROA: 53 mm  MR PISA Radius: 1.10 cm   Buford Dresser MD  Electronically signed by Buford Dresser MD  Signature Date/Time: 09/16/2020/3:47:58 PM      RIGHT/LEFT HEART CATH AND CORONARY ANGIOGRAPHY    Conclusion   Normal coronary arteries with right dominant anatomy.  Mild pulmonary hypertension with mean pulmonary artery pressure 29 mmHg.  Pulmonary capillary wedge mean 17 mmHg and left ventricular end-diastolic pressure 17 mmHg.  Pulmonary vascular resistance 2.39 Woods units.  WHO group 2 etiology.  Severe mitral regurgitation documented by TEE and confirmed by hemodynamic recordings with V waves spiking to 41 mmHg.  Left ventricular systolic function is poorly assessed by the current study.  RECOMMENDATIONS:   Referred to structural heart team for consideration of MitraClip versus surgical repair.  Upcoming appointment with Dr. Burt Knack 08/22/2020.   Recommendations  Antiplatelet/Anticoag Recommend Aspirin 81mg  daily for moderate CAD.   Surgeon Notes    09/16/2020 1:41 PM CV  Procedure signed by Belva Crome, MD    09/16/2020 10:57 AM CV Procedure signed by Buford Dresser, MD    Indications  Severe mitral regurgitation [I34.0 (ICD-10-CM)]   Procedural Details  Technical Details The right radial area was sterilely prepped and draped. Intravenous sedation with Versed and fentanyl was administered. 1% Xylocaine was infiltrated to achieve local analgesia. Using real-time vascular ultrasound, a double wall stick with an angiocath was utilized to obtain intra-arterial access. A VUS image was saved for the permanent record.The modified Seldinger technique was used to place a 2F " Slender" sheath in the right radial artery. Weight based heparin was administered. Coronary angiography was done using 5 F catheters. Right coronary angiography was performed with a JR4. Left ventricular hemodymic recordings and angiography was done using the JR 4 catheter and hand injection. Left coronary angiography was performed with a JL 3.5 cm.  Right heart catheterization was performed by exchanging a previously placed antecubital IV angio-cath for a 5 French Slender sheath. 1% Xylocaine was used to locally nesthetize the area around the IV site. The IV catheter was wired using an .018 guidewire. The modified Seldinger technique was used to place the 5 [place] sheath. Double glove technique was used to enhance sterility. After sheath insertion, right heart cath was performed using a 5 French balloon tipped catheter and fluoroscopic guidance. Pressures were recorded in each chamber and in the pulmonary capillary wedge position.. The main pulmonary artery O2 saturation was sampled.   Hemostasis was achieved using a pneumatic band.  During this procedure the patient is administered a total of Versed 1 mg and Fentanyl 0 mcg to achieve and maintain moderate conscious sedation.  The patient's heart rate, blood pressure, and oxygen saturation are monitored continuously during the procedure. The  period of conscious sedation is  37 minutes, of which I was present face-to-face 100% of this time. Estimated blood loss <50 mL.   During this procedure medications were administered to achieve and maintain moderate conscious sedation while the patient's heart rate, blood pressure, and oxygen saturation were continuously monitored and I was present face-to-face 100% of this time.   Medications (Filter: Administrations occurring from 1220 to 1342 on 09/16/20)  Heparin (Porcine) in NaCl 1000-0.9 UT/500ML-% SOLN (mL) Total volume:  1,000 mL  Date/Time Rate/Dose/Volume Action   09/16/20 1240 500 mL Given   1240 500 mL Given    midazolam (VERSED) injection (mg) Total dose:  1 mg  Date/Time Rate/Dose/Volume Action   09/16/20 1251 1 mg Given    lidocaine (PF) (XYLOCAINE) 1 % injection (mL) Total volume:  4 mL  Date/Time Rate/Dose/Volume Action   09/16/20 1252 4 mL Given    Radial Cocktail/Verapamil only (mL) Total volume:  10 mL  Date/Time Rate/Dose/Volume Action   09/16/20 1257 10 mL Given    heparin sodium (porcine) injection (Units) Total dose:  3,000 Units  Date/Time Rate/Dose/Volume Action   09/16/20 1311 3,000 Units Given    iohexol (OMNIPAQUE) 350 MG/ML injection (mL) Total volume:  55 mL  Date/Time Rate/Dose/Volume Action   09/16/20 1327 55 mL Given     Sedation Time  Sedation Time Physician-1: 32 minutes 38 seconds   Contrast  Medication Name Total Dose  iohexol (OMNIPAQUE) 350 MG/ML injection 55 mL    Radiation/Fluoro  Fluoro time: 4.7 (min) DAP: 6.5 (Gycm2) Cumulative Air Kerma: 117.2 (mGy)   Coronary Findings   Diagnostic Dominance: Right  No diagnostic findings have been documented.  Intervention   No interventions have been documented.  Right Heart  Right Heart Pressures Hemodynamic findings consistent with mild pulmonary hypertension. LV EDP is normal.   Wall Motion  Resting                Left Heart  Left Ventricle  The left ventricular size is normal. The left ventricular systolic function is normal. LV end diastolic pressure is normal. The left ventricular ejection fraction is 55-65% by visual estimate. No regional wall motion abnormalities.   Coronary Diagrams   Diagnostic Dominance: Right    Intervention    Implants    No implant documentation for this case.    Syngo Images  Show images for CARDIAC CATHETERIZATION  Images on Long Term Storage  Show images for [place], [place] to Procedure Log  Procedure Log     Hemo Data  Flowsheet Row Most Recent Value  Fick Cardiac Output 5.03 L/min  Fick Cardiac Output Index 3.27 (L/min)/BSA  Thermal Cardiac Output 3.82 L/min  Thermal Cardiac Output Index 2.48 (L/min)/BSA  RA A Wave 9 mmHg  RA V Wave 8 mmHg  RA Mean 6 mmHg  RV Systolic Pressure 49 mmHg  RV Diastolic Pressure 1 mmHg  RV EDP 10 mmHg  PA Systolic Pressure 49 mmHg  PA Diastolic Pressure 14 mmHg  PA Mean 29 mmHg  PW A Wave 20 mmHg  PW V Wave 41 mmHg  PW Mean 17 mmHg  AO Systolic Pressure 449 mmHg  AO Diastolic Pressure 73 mmHg  AO Mean 675 mmHg  LV Systolic Pressure 916 mmHg  LV Diastolic Pressure 9 mmHg  LV EDP 15 mmHg  AOp Systolic Pressure 384 mmHg  AOp Diastolic Pressure 73 mmHg  AOp Mean Pressure 665 mmHg  LVp Systolic Pressure 993 mmHg  LVp Diastolic Pressure 12 mmHg  LVp EDP  Pressure 17 mmHg  TPVR Index 11.69 HRUI  TSVR Index 42.72 HRUI  PVR SVR Ratio 0.12  TPVR/TSVR Ratio 0.27     Impression:  Patient has mitral valve prolapse with stage D severe symptomatic primary mitral regurgitation.  She describes progressive symptoms of exertional shortness of breath, orthopnea, PND, and intermittent lower extremity edema consistent with chronic diastolic congestive heart failure, New York Heart Association functional class III.  She has done a little bit better on oral diuretic therapy but nonetheless remains significantly symptomatic.  I have  personally reviewed the patient's recent transthoracic and transesophageal echocardiograms as well as her diagnostic cardiac catheterization.  Echocardiograms document the presence of myxomatous degenerative disease of the mitral valve.  Both leaflets are mildly thickened.  There is severe prolapse involving middle scallop (P2) of the posterior leaflet with obvious ruptured primary chordae tendinae.  There is type II dysfunction with severe mitral regurgitation.  The jet of regurgitation is eccentric.  The left atrium is dilated although precise measurements were not obtained.  I disagree with the interpretation of the patient's TEE and feel that there is significant left ventricular hypertrophy and possibly some mild systolic dysfunction as well masked by the presence of increased ejection fraction in the setting of severe mitral regurgitation.  Right ventricular size and systolic function appears normal.  There is probably moderate central tricuspid regurgitation.  The tricuspid annulus diameter was not measured.  The aortic valve is trileaflet.  There is trivial aortic insufficiency.  No other significant abnormalities are noted.  Diagnostic catheterization is notable for the absence of any significant coronary artery disease.  Right heart pressures were only mildly elevated but there were large V waves on wedge tracing consistent with severe mitral regurgitation.  I agree the patient would benefit from mitral valve repair.  Risks associated with conventional surgery would be somewhat increased because of the patient's advanced age.  Although I suspect the patient would likely survive surgery I am concerned that her recovery might be somewhat slow regardless of whether or not her surgery was performed via conventional median sternotomy or using minimally invasive approach.  She may be at somewhat increased risk for stroke and/or postoperative confusion or delirium.   Plan:  The patient was counseled at  length regarding her diagnosis of severe primary mitral regurgitation.  We reviewed the results of their diagnostic tests including images from the most recent echocardiogram.  We discussed the natural history of mitral regurgitation as well as alternative treatment strategies.  We discussed the impact of her age, current state of health, and all significant comorbid medical problems on clinical decision making.  Alternative approaches such as conventional surgical mitral valve repair or replacement with or without minimally-invasive techniques, percutaneous edge-to-edge Mitraclip repair, and continued medical therapy without intervention were compared and contrasted at length.  The risks associated with conventional surgery were discussed in detail, as were expectations for post-operative convalescence, and why I am somewhat reluctant to consider this patient a candidate for conventional surgery.  Issues specific to Mitraclip repair were discussed including questions about long term freedom from persistent or recurrent mitral regurgitation, the potential for device migration or embolization, and other technical complications related to the procedure itself.  Long-term prognosis with medical therapy was discussed. This discussion was placed in the context of the patient's own specific clinical presentation and past medical history.   At this point I am concerned that the patient does not completely understand the differences between conventional surgery and transcatheter edge-to-edge  repair from the standpoint of relative risks and benefits and expectations for her postoperative convalescence.  She seems to have come to the office with preconceived ideas about both.  As the next step the patient will undergo CT angiography to evaluate the feasibility of peripheral cannulation for surgery.  I have asked the patient to return to our office once CT angiography has been completed with at least 1 family member present  to further discuss treatment options at length.  All of her questions have been addressed.    I spent in excess of 90 minutes during the conduct of this office consultation and >50% of this time involved direct face-to-face encounter with the patient for counseling and/or coordination of their care.      Valentina Gu. Roxy Manns, MD 10/02/2020 1:45 PM

## 2020-10-03 ENCOUNTER — Telehealth: Payer: Self-pay | Admitting: *Deleted

## 2020-10-03 ENCOUNTER — Ambulatory Visit: Payer: Medicare Other | Admitting: Cardiology

## 2020-10-03 ENCOUNTER — Telehealth: Payer: Self-pay | Admitting: Cardiovascular Disease

## 2020-10-03 NOTE — Telephone Encounter (Signed)
Erin [name] was seen in surgical consultation by Dr. Roxy Manns yesterday. Today, she contacted the office stating she is having some chest pain despite taking her "water pill". Advised pt to contact Dr. Antionette Char office, pt provided with office phone number. Pt verbalized understanding.

## 2020-10-03 NOTE — Telephone Encounter (Signed)
Spoke with the patient regarding her chest pressure. She states that it has been going on intermittently for the past couple of weeks but more chest pressure during the past few days. She saw Dr. Roxy Manns yesterday for her evaluation of her mitral valve repair. She states Dr. Roxy Manns was going to give her something for the pain but must have forgotten because she never got anything. She reports SOB on exertion which she states is normal for her. Recent ECHO and Cardiac cath. Spoke with Dr. Tamala Julian regarding patient who recommended patient seek care from PCP as he does not think this is cardiac related, perhaps anxiety related instead.   Informed patient of Dr. Thompson Caul recommendations. She verbalized understanding and will call back with any new concerns.

## 2020-10-03 NOTE — Telephone Encounter (Signed)
Pt c/o of Chest Pain: 1. Are you having CP right now? Yes  2. Are you experiencing any other symptoms (ex. SOB, nausea, vomiting, sweating)? SOB 3. How long have you been experiencing CP? About 4 days 4. Is your CP continuous or coming and going? Coming and going  5. Have you taken Nitroglycerin? No

## 2020-10-15 ENCOUNTER — Telehealth: Payer: Self-pay

## 2020-10-15 NOTE — Telephone Encounter (Signed)
  [facility]   MULTIDISCIPLINARY HEART VALVE TEAM  The pt contacted [facility] yesterday to cancel 12/29 CT scans because she has decided that she would like to undergo MitraClip.  I contacted the pt to discuss the plan and answer any additional questions.  After meeting with Dr Roxy Manns and discussing her options the pt has decided that she does not want to pursue cardiac surgery and would like to proceed with MitraClip to treat her mitral insufficiency.  The pt's symptoms are stable from a cardiac standpoint but she is interested in having a back injection with Dr Ellene Route and she would like to know if she is okay to proceed.  I advised the pt that I will speak with Dr Burt Knack to see if she can proceed.  At this time I have cancelled the pt's 12/29 CT scans and will make Dr Burt Knack and Dr Roxy Manns aware of the pt's plan.  I did speak with her about potential dates for MitraClip (1/13 and 2/10) and she will call me back when she decides on a date.

## 2020-10-16 NOTE — Telephone Encounter (Signed)
I discussed pt's case with Dr Burt Knack and the pt is okay to proceed with evaluation for back injection with Dr Ellene Route.  At this time I will cancel the follow-up appointment scheduled with Dr Roxy Manns on 1/10 and I have scheduled the pt to see Dr Burt Knack on 1/24 to continue MitraClip discussion.  The pt would tentatively like to plan for MitraClip on 2/10.

## 2020-10-22 ENCOUNTER — Other Ambulatory Visit: Payer: Medicare Other

## 2020-10-22 DIAGNOSIS — M25562 Pain in left knee: Secondary | ICD-10-CM | POA: Diagnosis not present

## 2020-10-22 DIAGNOSIS — M25561 Pain in right knee: Secondary | ICD-10-CM | POA: Diagnosis not present

## 2020-10-27 NOTE — Progress Notes (Signed)
Erin [name] DOB 02/04/1935  This looks like a clippable valve. The fossa looks approachable for transseptal puncture in both the SAXB and Bicaval views. LA dimensions are large enough for device steering and straddle. The posterior leaflet is prolapse with a flail causing an anteriorly directed MR jet. It is central. The posterior leaflet measures over 1 cm in the 122 LVOT grasping view. I would like to verify both MVA and gradient in the case prior to start.  Based on this information, I'd recommend starting with an XTW and assessing for gradient.

## 2020-10-29 ENCOUNTER — Other Ambulatory Visit: Payer: Self-pay

## 2020-10-29 DIAGNOSIS — I340 Nonrheumatic mitral (valve) insufficiency: Secondary | ICD-10-CM

## 2020-10-31 ENCOUNTER — Other Ambulatory Visit: Payer: Self-pay

## 2020-10-31 ENCOUNTER — Ambulatory Visit (INDEPENDENT_AMBULATORY_CARE_PROVIDER_SITE_OTHER): Payer: Medicare Other | Admitting: Cardiovascular Disease

## 2020-10-31 ENCOUNTER — Encounter: Payer: Self-pay | Admitting: Cardiovascular Disease

## 2020-10-31 VITALS — BP 130/70 | HR 59 | Ht 63.0 in | Wt 126.0 lb

## 2020-10-31 DIAGNOSIS — I340 Nonrheumatic mitral (valve) insufficiency: Secondary | ICD-10-CM | POA: Diagnosis not present

## 2020-10-31 NOTE — H&P (View-Only) (Signed)
Cardiology Office Note:    Date:  10/31/2020   ID:  [name], DOB [birth year], MRN [id number]  PCP:  Tammi Sou, MD  [facility] Cardiologist:  No primary care provider on file.  [facility] Electrophysiologist:  None   Referring MD: Tammi Sou, MD   Chief Complaint  Patient presents with  . Shortness of Breath    History of Present Illness:    Erin [name] is a 86 y.o. female with a hx of mitral valve prolapse with severe symptomatic mitral regurgitation, presenting for follow-up evaluation.  The patient has a longstanding history of mitral valve prolapse but has developed a partially flail segment of the middle scallop of the posterior leaflet with progressive mitral regurgitation.  She has been symptomatic with worsening shortness of breath and orthopnea.  She underwent transesophageal echo in November 2021 demonstrating myxomatous degeneration of the mitral valve with a partially flail P2 segment of the posterior leaflet and severe mitral regurgitation.  LVEF has been 60 to 65% and RV size and function have been normal.  The patient is also demonstrated to have moderately severe tricuspid regurgitation.  Right and left heart catheterization was notable for normal coronary anatomy with no significant CAD.  She was found to have a large V waves on her pulmonary capillary wedge tracing and mild pulmonary hypertension, suggestive of severe mitral regurgitation.  She underwent formal cardiac surgical evaluation by Dr. Roxy Manns.  While she is felt to have a repairable valve, her surgical risk is increased because of advanced age and she is felt to be a good candidate for a minimally invasive approach with transcatheter edge-to-edge mitral valve repair.  The patient is here alone today. She reports worsening shortness of breath over the past 4 weeks. She is now short of breath with taking a shower. She also reports the presence of orthopnea and PND most nights. States that she  generally moves to a recliner in the middle of the night. She has an occasional chest pressure in the middle of her chest, unrelated to physical exertion. No lightheadedness or syncope. No palpitations.   Past Medical History:  Diagnosis Date  . Achilles tendon rupture    left, no surgery required  . Hypertension   . Hypertensive retinopathy of both eyes    Dr. Herbert Deaner  . Low back pain   . Lower extremity edema    lasix prn  . OA (osteoarthritis)   . Osteopenia   . Severe mitral regurgitation    2021 transthoracic echo and TEE->cardiology following  . Severe pulmonary hypertension ([place]) 2021   transth echo; moderate by TEE 08/2020  . Systolic murmur 0000000   severe mitral regurge, mod/severe tricuspic regurg    Past Surgical History:  Procedure Laterality Date  . APPENDECTOMY     86 yrs old  . CHOLECYSTECTOMY    . LUMBAR LAMINECTOMY  2004  . RIGHT/LEFT HEART CATH AND CORONARY ANGIOGRAPHY N/A 09/16/2020   No CAD. Procedure: RIGHT/LEFT HEART CATH AND CORONARY ANGIOGRAPHY;  Surgeon: Belva Crome, MD;  Location: [facility];  Service: Cardiovascular;  Laterality: N/A;  . TEE WITHOUT CARDIOVERSION N/A 09/16/2020   Procedure: TRANSESOPHAGEAL ECHOCARDIOGRAM (TEE);  Surgeon: Buford Dresser, MD;  Location: [facility];  Service: Cardiovascular;  Laterality: N/A;  . TRANSTHORACIC ECHOCARDIOGRAM  07/03/2020   TTE EF 60-65%, grd II DD, severe pulm art HTN, severe mitral valve regurg, mod/sev tricuspic valve regurg. Conf on TEE 08/2020    Current Medications: Current  Meds  Medication Sig  . Ascorbic Acid (VITAMIN C PO) Take 1 tablet by mouth daily.  . carvedilol (COREG) 25 MG tablet Take 1 tablet (25 mg total) by mouth daily.  . diazepam (VALIUM) 2 MG tablet Take 2 mg by mouth daily as needed for anxiety.  . furosemide (LASIX) 20 MG tablet Take 1 tablet (20 mg total) by mouth every Monday, Wednesday, and Friday.  . loratadine (CLARITIN) 10 MG tablet TAKE ONE TABLET  BY MOUTH EVERY DAY AS NEEDED FOR ALLERGY  . melatonin 5 MG TABS Take 5 mg by mouth at bedtime.  . Multiple Vitamin (MULTIVITAMIN WITH MINERALS) TABS tablet Take 1 tablet by mouth daily.  . Multiple Vitamins-Minerals (PRESERVISION AREDS PO) Take 1 tablet by mouth in the morning and at bedtime.   . Multiple Vitamins-Minerals (ZINC PO) Take 1 tablet by mouth daily.  . nitroGLYCERIN (NITRODUR - DOSED IN MG/24 HR) 0.2 mg/hr patch 1/2 patch over L Achilles tendon bid  . potassium chloride (KLOR-CON) 10 MEQ tablet Take 10 mEq by mouth daily as needed (when taking lasix).  Marland Kitchen tretinoin (RETIN-A) 0.1 % cream Apply topically at bedtime.  Marland Kitchen VITAMIN D PO Take 3 capsules by mouth 2 (two) times daily.  Marland Kitchen VITAMIN E PO Take 1 capsule by mouth daily.     Allergies:   Barbiturates, Demerol, Hydrochlorothiazide, Irbesartan, Losartan potassium, Olmesartan medoxomil, Procaine, and Telmisartan   Social History   Socioeconomic History  . Marital status: Married    Spouse name: Not on file  . Number of children: Not on file  . Years of education: Not on file  . Highest education level: Not on file  Occupational History  . Not on file  Tobacco Use  . Smoking status: Former Research scientist (life sciences)  . Smokeless tobacco: Never Used  . Tobacco comment: As a teenager  Vaping Use  . Vaping Use: Never used  Substance and Sexual Activity  . Alcohol use: No  . Drug use: Never  . Sexual activity: Never  Other Topics Concern  . Not on file  Social History Narrative   Married, 6 children, about 15 GC.  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   No tob.   No alc.   Social Determinants of Health   Financial Resource Strain: Not on file  Food Insecurity: Not on file  Transportation Needs: Not on file  Physical Activity: Not on file  Stress: Not on file  Social Connections: Not on file     Family History: The patient's family history includes Cancer in her  father; Early death in her father; Hypertension in her mother and another family member.  ROS:   Please see the history of present illness.    All other systems reviewed and are negative.  EKGs/Labs/Other Studies Reviewed:    The following studies were reviewed today: Cardiac Catheterization 09/16/2020: Conclusion   Normal coronary arteries with right dominant anatomy.  Mild pulmonary hypertension with mean pulmonary artery pressure 29 mmHg.  Pulmonary capillary wedge mean 17 mmHg and left ventricular end-diastolic pressure 17 mmHg.  Pulmonary vascular resistance 2.39 Woods units.  WHO group 2 etiology.  Severe mitral regurgitation documented by TEE and confirmed by hemodynamic recordings with V waves spiking to 41 mmHg.  Left ventricular systolic function is poorly assessed by the current study.  RECOMMENDATIONS:   Referred to structural heart team for consideration of MitraClip versus  surgical repair.  Upcoming appointment with Dr. Burt Knack 08/22/2020.  TEE 09/16/2020: IMPRESSIONS    1. Left ventricular ejection fraction, by estimation, is 60 to 65%. The  left ventricle has normal function.  2. Right ventricular systolic function is normal. The right ventricular  size is normal.  3. No left atrial/left atrial appendage thrombus was detected.  4. There is partial flail of P2 with chordae attached. There is severe  eccentric mitral valve regurgitation directed anteriorly. . The mitral  valve is abnormal. Severe mitral valve regurgitation. No evidence of  mitral stenosis.  5. Tricuspid valve regurgitation is moderate to severe.  6. The aortic valve is tricuspid. Aortic valve regurgitation is trivial.  No aortic stenosis is present.  7. There is Moderate (Grade III) plaque involving the descending aorta.  8. Possible shunting.   Conclusion(s)/Recommendation(s): Severe eccentric mitral valve  regurgitation with P2 segment flail with mobile chordae.   EKG:  EKG is  not ordered today.    Recent Labs: 01/30/2020: Pro B Natriuretic peptide (BNP) 475.0 07/02/2020: ALT 12; Magnesium 1.9; TSH 2.37 08/27/2020: BUN 18; Creatinine, Ser 0.70; Platelets 243 09/16/2020: Hemoglobin 12.9; Potassium 3.7; Sodium 141  Recent Lipid Panel    Component Value Date/Time   CHOL 139 07/02/2020 1057   TRIG 30.0 07/02/2020 1057   TRIG 33 09/21/2006 1115   HDL 80.30 07/02/2020 1057   CHOLHDL 2 07/02/2020 1057   VLDL 6.0 07/02/2020 1057   LDLCALC 53 07/02/2020 1057     Risk Assessment/Calculations:       Physical Exam:    VS:  BP 130/70   Pulse (!) 59   Ht 5\' 3"  (1.6 m)   Wt 126 lb (57.2 kg)   SpO2 97%   BMI 22.32 kg/m     Wt Readings from Last 3 Encounters:  10/31/20 126 lb (57.2 kg)  10/02/20 127 lb (57.6 kg)  09/22/20 129 lb 12.8 oz (58.9 kg)     GEN:  Well nourished, well developed elderly woman in no acute distress HEENT: Normal NECK: No JVD; No carotid bruits LYMPHATICS: No lymphadenopathy CARDIAC: RRR, 3/6 loud holosystolic murmur at the apex RESPIRATORY:  Clear to auscultation without rales, wheezing or rhonchi  ABDOMEN: Soft, non-tender, non-distended MUSCULOSKELETAL:  No edema; No deformity  SKIN: Warm and dry NEUROLOGIC:  Alert and oriented x 3 PSYCHIATRIC:  Normal affect   ASSESSMENT:    1. Severe mitral insufficiency    PLAN:    In order of problems listed above:  1. The patient has severe, stage D nonrheumatic mitral regurgitation with New York Heart Association functional class III symptoms of chronic diastolic heart failure (progressive exertional dyspnea, orthopnea, and PND).  I have personally reviewed her echo, cardiac catheterization, and TEE images.  She has severe eccentric MR with evidence of a P2 flail secondary to ruptured chordae tendonae.  Mitral valve repair is clinically indicated to prevent progressive symptoms of heart failure and improve quality of life.  We discussed pros and cons of surgical and transcatheter mitral  valve repair.  She is in agreement that she would like to pursue transcatheter mitral valve edge-to-edge repair.  She understands that the procedure is performed under general anesthesia with transesophageal echo guidance through a right transfemoral percutaneous approach.  She understands that transseptal puncture is performed.  I reviewed potential risks, including bleeding, infection, stroke, myocardial infarction, arrhythmia, device embolization, single leaflet device attachment, cardiac perforation, tamponade, hemopericardium, emergency cardiac surgery, and death.  She understands that the risk of severe  complication occurs at a frequency of 1 to 2%.  All of her questions are answered.  She would like to proceed at the next available time.  The patient is scheduled next week.  Even with the current COVID-19 pandemic restrictions around scheduling of procedures that require an overnight stay, I think it is appropriate to proceed in this patient with progressive symptoms of heart failure currently with class III limitation.  Medication Adjustments/Labs and Tests Ordered: Current medicines are reviewed at length with the patient today.  Concerns regarding medicines are outlined above.  No orders of the defined types were placed in this encounter.  No orders of the defined types were placed in this encounter.   There are no Patient Instructions on file for this visit.   Signed, Sherren Mocha, MD  10/31/2020 2:42 PM    [facility]

## 2020-10-31 NOTE — Progress Notes (Signed)
Cardiology Office Note:    Date:  10/31/2020   ID:  Erin [name], DOB October 13, 1935, MRN [id number]  PCP:  Tammi Sou, MD  [facility] Cardiologist:  No primary care provider on file.  [facility] Electrophysiologist:  None   Referring MD: Tammi Sou, MD   Chief Complaint  Patient presents with  . Shortness of Breath    History of Present Illness:    Erin [name] is a 86 y.o. female with a hx of mitral valve prolapse with severe symptomatic mitral regurgitation, presenting for follow-up evaluation.  The patient has a longstanding history of mitral valve prolapse but has developed a partially flail segment of the middle scallop of the posterior leaflet with progressive mitral regurgitation.  She has been symptomatic with worsening shortness of breath and orthopnea.  She underwent transesophageal echo in November 2021 demonstrating myxomatous degeneration of the mitral valve with a partially flail P2 segment of the posterior leaflet and severe mitral regurgitation.  LVEF has been 60 to 65% and RV size and function have been normal.  The patient is also demonstrated to have moderately severe tricuspid regurgitation.  Right and left heart catheterization was notable for normal coronary anatomy with no significant CAD.  She was found to have a large V waves on her pulmonary capillary wedge tracing and mild pulmonary hypertension, suggestive of severe mitral regurgitation.  She underwent formal cardiac surgical evaluation by Dr. Roxy Manns.  While she is felt to have a repairable valve, her surgical risk is increased because of advanced age and she is felt to be a good candidate for a minimally invasive approach with transcatheter edge-to-edge mitral valve repair.  The patient is here alone today. She reports worsening shortness of breath over the past 4 weeks. She is now short of breath with taking a shower. She also reports the presence of orthopnea and PND most nights. States that she  generally moves to a recliner in the middle of the night. She has an occasional chest pressure in the middle of her chest, unrelated to physical exertion. No lightheadedness or syncope. No palpitations.   Past Medical History:  Diagnosis Date  . Achilles tendon rupture    left, no surgery required  . Hypertension   . Hypertensive retinopathy of both eyes    Dr. Herbert Deaner  . Low back pain   . Lower extremity edema    lasix prn  . OA (osteoarthritis)   . Osteopenia   . Severe mitral regurgitation    2021 transthoracic echo and TEE->cardiology following  . Severe pulmonary hypertension ([place]) 2021   transth echo; moderate by TEE 08/2020  . Systolic murmur 0000000   severe mitral regurge, mod/severe tricuspic regurg    Past Surgical History:  Procedure Laterality Date  . APPENDECTOMY     86 yrs old  . CHOLECYSTECTOMY    . LUMBAR LAMINECTOMY  2004  . RIGHT/LEFT HEART CATH AND CORONARY ANGIOGRAPHY N/A 09/16/2020   No CAD. Procedure: RIGHT/LEFT HEART CATH AND CORONARY ANGIOGRAPHY;  Surgeon: Belva Crome, MD;  Location: [facility];  Service: Cardiovascular;  Laterality: N/A;  . TEE WITHOUT CARDIOVERSION N/A 09/16/2020   Procedure: TRANSESOPHAGEAL ECHOCARDIOGRAM (TEE);  Surgeon: Buford Dresser, MD;  Location: [facility] ENDOSCOPY;  Service: Cardiovascular;  Laterality: N/A;  . TRANSTHORACIC ECHOCARDIOGRAM  07/03/2020   TTE EF 60-65%, grd II DD, severe pulm art HTN, severe mitral valve regurg, mod/sev tricuspic valve regurg. Conf on TEE 08/2020    Current Medications: Current  Meds  Medication Sig  . Ascorbic Acid (VITAMIN C PO) Take 1 tablet by mouth daily.  . carvedilol (COREG) 25 MG tablet Take 1 tablet (25 mg total) by mouth daily.  . diazepam (VALIUM) 2 MG tablet Take 2 mg by mouth daily as needed for anxiety.  . furosemide (LASIX) 20 MG tablet Take 1 tablet (20 mg total) by mouth every Monday, Wednesday, and Friday.  . loratadine (CLARITIN) 10 MG tablet TAKE ONE TABLET  BY MOUTH EVERY DAY AS NEEDED FOR ALLERGY  . melatonin 5 MG TABS Take 5 mg by mouth at bedtime.  . Multiple Vitamin (MULTIVITAMIN WITH MINERALS) TABS tablet Take 1 tablet by mouth daily.  . Multiple Vitamins-Minerals (PRESERVISION AREDS PO) Take 1 tablet by mouth in the morning and at bedtime.   . Multiple Vitamins-Minerals (ZINC PO) Take 1 tablet by mouth daily.  . nitroGLYCERIN (NITRODUR - DOSED IN MG/24 HR) 0.2 mg/hr patch 1/2 patch over L Achilles tendon bid  . potassium chloride (KLOR-CON) 10 MEQ tablet Take 10 mEq by mouth daily as needed (when taking lasix).  Marland Kitchen tretinoin (RETIN-A) 0.1 % cream Apply topically at bedtime.  Marland Kitchen VITAMIN D PO Take 3 capsules by mouth 2 (two) times daily.  Marland Kitchen VITAMIN E PO Take 1 capsule by mouth daily.     Allergies:   Barbiturates, Demerol, Hydrochlorothiazide, Irbesartan, Losartan potassium, Olmesartan medoxomil, Procaine, and Telmisartan   Social History   Socioeconomic History  . Marital status: Married    Spouse name: Not on file  . Number of children: Not on file  . Years of education: Not on file  . Highest education level: Not on file  Occupational History  . Not on file  Tobacco Use  . Smoking status: Former Research scientist (life sciences)  . Smokeless tobacco: Never Used  . Tobacco comment: As a teenager  Vaping Use  . Vaping Use: Never used  Substance and Sexual Activity  . Alcohol use: No  . Drug use: Never  . Sexual activity: Never  Other Topics Concern  . Not on file  Social History Narrative   Married, 6 children, about 15 GC.  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   No tob.   No alc.   Social Determinants of Health   Financial Resource Strain: Not on file  Food Insecurity: Not on file  Transportation Needs: Not on file  Physical Activity: Not on file  Stress: Not on file  Social Connections: Not on file     Family History: The patient's family history includes Cancer in her  father; Early death in her father; Hypertension in her mother and another family member.  ROS:   Please see the history of present illness.    All other systems reviewed and are negative.  EKGs/Labs/Other Studies Reviewed:    The following studies were reviewed today: Cardiac Catheterization 09/16/2020: Conclusion   Normal coronary arteries with right dominant anatomy.  Mild pulmonary hypertension with mean pulmonary artery pressure 29 mmHg.  Pulmonary capillary wedge mean 17 mmHg and left ventricular end-diastolic pressure 17 mmHg.  Pulmonary vascular resistance 2.39 Woods units.  WHO group 2 etiology.  Severe mitral regurgitation documented by TEE and confirmed by hemodynamic recordings with V waves spiking to 41 mmHg.  Left ventricular systolic function is poorly assessed by the current study.  RECOMMENDATIONS:   Referred to structural heart team for consideration of MitraClip versus  surgical repair.  Upcoming appointment with Dr. Burt Knack 08/22/2020.  TEE 09/16/2020: IMPRESSIONS    1. Left ventricular ejection fraction, by estimation, is 60 to 65%. The  left ventricle has normal function.  2. Right ventricular systolic function is normal. The right ventricular  size is normal.  3. No left atrial/left atrial appendage thrombus was detected.  4. There is partial flail of P2 with chordae attached. There is severe  eccentric mitral valve regurgitation directed anteriorly. . The mitral  valve is abnormal. Severe mitral valve regurgitation. No evidence of  mitral stenosis.  5. Tricuspid valve regurgitation is moderate to severe.  6. The aortic valve is tricuspid. Aortic valve regurgitation is trivial.  No aortic stenosis is present.  7. There is Moderate (Grade III) plaque involving the descending aorta.  8. Possible shunting.   Conclusion(s)/Recommendation(s): Severe eccentric mitral valve  regurgitation with P2 segment flail with mobile chordae.   EKG:  EKG is  not ordered today.    Recent Labs: 01/30/2020: Pro B Natriuretic peptide (BNP) 475.0 07/02/2020: ALT 12; Magnesium 1.9; TSH 2.37 08/27/2020: BUN 18; Creatinine, Ser 0.70; Platelets 243 09/16/2020: Hemoglobin 12.9; Potassium 3.7; Sodium 141  Recent Lipid Panel    Component Value Date/Time   CHOL 139 07/02/2020 1057   TRIG 30.0 07/02/2020 1057   TRIG 33 09/21/2006 1115   HDL 80.30 07/02/2020 1057   CHOLHDL 2 07/02/2020 1057   VLDL 6.0 07/02/2020 1057   LDLCALC 53 07/02/2020 1057     Risk Assessment/Calculations:       Physical Exam:    VS:  BP 130/70   Pulse (!) 59   Ht 5\' 3"  (1.6 m)   Wt 126 lb (57.2 kg)   SpO2 97%   BMI 22.32 kg/m     Wt Readings from Last 3 Encounters:  10/31/20 126 lb (57.2 kg)  10/02/20 127 lb (57.6 kg)  09/22/20 129 lb 12.8 oz (58.9 kg)     GEN:  Well nourished, well developed elderly woman in no acute distress HEENT: Normal NECK: No JVD; No carotid bruits LYMPHATICS: No lymphadenopathy CARDIAC: RRR, 3/6 loud holosystolic murmur at the apex RESPIRATORY:  Clear to auscultation without rales, wheezing or rhonchi  ABDOMEN: Soft, non-tender, non-distended MUSCULOSKELETAL:  No edema; No deformity  SKIN: Warm and dry NEUROLOGIC:  Alert and oriented x 3 PSYCHIATRIC:  Normal affect   ASSESSMENT:    1. Severe mitral insufficiency    PLAN:    In order of problems listed above:  1. The patient has severe, stage D nonrheumatic mitral regurgitation with New York Heart Association functional class III symptoms of chronic diastolic heart failure (progressive exertional dyspnea, orthopnea, and PND).  I have personally reviewed her echo, cardiac catheterization, and TEE images.  She has severe eccentric MR with evidence of a P2 flail secondary to ruptured chordae tendonae.  Mitral valve repair is clinically indicated to prevent progressive symptoms of heart failure and improve quality of life.  We discussed pros and cons of surgical and transcatheter mitral  valve repair.  She is in agreement that she would like to pursue transcatheter mitral valve edge-to-edge repair.  She understands that the procedure is performed under general anesthesia with transesophageal echo guidance through a right transfemoral percutaneous approach.  She understands that transseptal puncture is performed.  I reviewed potential risks, including bleeding, infection, stroke, myocardial infarction, arrhythmia, device embolization, single leaflet device attachment, cardiac perforation, tamponade, hemopericardium, emergency cardiac surgery, and death.  She understands that the risk of severe  complication occurs at a frequency of 1 to 2%.  All of her questions are answered.  She would like to proceed at the next available time.  The patient is scheduled next week.  Even with the current COVID-19 pandemic restrictions around scheduling of procedures that require an overnight stay, I think it is appropriate to proceed in this patient with progressive symptoms of heart failure currently with class III limitation.  Medication Adjustments/Labs and Tests Ordered: Current medicines are reviewed at length with the patient today.  Concerns regarding medicines are outlined above.  No orders of the defined types were placed in this encounter.  No orders of the defined types were placed in this encounter.   There are no Patient Instructions on file for this visit.   Signed, Sherren Mocha, MD  10/31/2020 2:42 PM    [facility]

## 2020-11-03 ENCOUNTER — Encounter: Payer: Medicare Other | Admitting: Thoracic Surgery (Cardiothoracic Vascular Surgery)

## 2020-11-03 ENCOUNTER — Ambulatory Visit: Payer: Medicare Other | Admitting: Thoracic Surgery (Cardiothoracic Vascular Surgery)

## 2020-11-03 ENCOUNTER — Telehealth: Payer: Self-pay | Admitting: Cardiovascular Disease

## 2020-11-03 NOTE — Progress Notes (Signed)
[place] [place], [place] [address] Phone: [phone] Fax: [phone]      Your procedure is scheduled on 11/06/2020.  Report to [facility] Main Entrance "A" at 05:30 A.M., and check in at the Admitting office.  Call this number if you have problems the morning of surgery:  [phone]  Call [phone] if you have any questions prior to your surgery date Monday-Friday 8am-4pm    Remember:  Do not eat or drink after midnight the night before your surgery     Take these medicines the morning of surgery with A SIP OF WATER  carvedilol (COREG) diazepam (VALIUM) - if needed loratadine (CLARITIN)- if needed  As of today, STOP taking any Aspirin (unless otherwise instructed by your surgeon) Aleve, Naproxen, Ibuprofen, Motrin, Advil, Goody's, BC's, all herbal medications, fish oil, and all vitamins.                      Do not wear jewelry, make up, or nail polish            Do not wear lotions, powders, perfumes/colognes, or deodorant.            Do not shave 48 hours prior to surgery.              Do not bring valuables to the hospital.            [facility] is not responsible for any belongings or valuables.  Do NOT Smoke (Tobacco/Vaping) or drink Alcohol 24 hours prior to your procedure If you use a CPAP at night, you may bring all equipment for your overnight stay.   Contacts, glasses, dentures or bridgework may not be worn into surgery.      For patients admitted to the hospital, discharge time will be determined by your treatment team.   Patients discharged the day of surgery will not be allowed to drive home, and someone needs to stay with them for 24 hours.    Special instructions:   [facility]- Preparing For Surgery  Before surgery, you can play an important role. Because skin is not sterile, your skin needs to be as free of germs as possible. You can reduce the number of germs on  your skin by washing with CHG (chlorahexidine gluconate) Soap before surgery.  CHG is an antiseptic cleaner which kills germs and bonds with the skin to continue killing germs even after washing.    Oral Hygiene is also important to reduce your risk of infection.  Remember - BRUSH YOUR TEETH THE MORNING OF SURGERY WITH YOUR REGULAR TOOTHPASTE  Please do not use if you have an allergy to CHG or antibacterial soaps. If your skin becomes reddened/irritated stop using the CHG.  Do not shave (including legs and underarms) for at least 48 hours prior to first CHG shower. It is OK to shave your face.  Please follow these instructions carefully.   1. Shower the NIGHT BEFORE SURGERY and the MORNING OF SURGERY with CHG Soap.   2. If you chose to wash your hair, wash your hair first as usual with your normal shampoo.  3. After you shampoo, rinse your hair and body thoroughly to remove the shampoo.  4. Use CHG as you would any other liquid soap. You can apply CHG directly to the skin and wash gently with a scrungie or a clean washcloth.   5. Apply the CHG Soap to  your body ONLY FROM THE NECK DOWN.  Do not use on open wounds or open sores. Avoid contact with your eyes, ears, mouth and genitals (private parts). Wash Face and genitals (private parts)  with your normal soap.   6. Wash thoroughly, paying special attention to the area where your surgery will be performed.  7. Thoroughly rinse your body with warm water from the neck down.  8. DO NOT shower/wash with your normal soap after using and rinsing off the CHG Soap.  9. Pat yourself dry with a CLEAN TOWEL.  10. Wear CLEAN PAJAMAS to bed the night before surgery  11. Place CLEAN SHEETS on your bed the night of your first shower and DO NOT SLEEP WITH PETS.   Day of Surgery: Wear Clean/Comfortable clothing the morning of surgery Do not apply any deodorants/lotions.   Remember to brush your teeth WITH YOUR REGULAR TOOTHPASTE.   Please read over  the following fact sheets that you were given.

## 2020-11-03 NOTE — Telephone Encounter (Signed)
Patient had some questions about her upcoming procedure Thursday. Please call

## 2020-11-03 NOTE — Telephone Encounter (Signed)
The pt called because she saw that she is also booked for a TEE on Thursday and she wanted to know why this was being performed again.  I advised the pt that MitraClip is performed under general anesthesia with TEE imaging throughout the entire procedure.  TEE is used for guidance with clip placement. The pt was advised to call with any additional questions or concerns.

## 2020-11-04 ENCOUNTER — Encounter (HOSPITAL_COMMUNITY): Payer: Self-pay

## 2020-11-04 ENCOUNTER — Ambulatory Visit: Payer: Medicare Other | Attending: Family Medicine | Admitting: Physical Therapy

## 2020-11-04 ENCOUNTER — Other Ambulatory Visit: Payer: Self-pay

## 2020-11-04 ENCOUNTER — Encounter: Payer: Self-pay | Admitting: Physical Therapy

## 2020-11-04 ENCOUNTER — Other Ambulatory Visit: Payer: Self-pay | Admitting: Physician Assistant

## 2020-11-04 ENCOUNTER — Ambulatory Visit (HOSPITAL_COMMUNITY)
Admission: RE | Admit: 2020-11-04 | Discharge: 2020-11-04 | Disposition: A | Discharge status: Home / Self Care | Payer: Medicare Other | Source: Ambulatory Visit | Attending: Cardiovascular Disease | Admitting: Cardiovascular Disease

## 2020-11-04 ENCOUNTER — Encounter (HOSPITAL_COMMUNITY)
Admission: RE | Admit: 2020-11-04 | Discharge: 2020-11-04 | Disposition: A | Discharge status: Home / Self Care | Payer: Medicare Other | Source: Ambulatory Visit | Attending: Cardiovascular Disease | Admitting: Cardiovascular Disease

## 2020-11-04 ENCOUNTER — Telehealth: Payer: Self-pay | Admitting: Cardiovascular Disease

## 2020-11-04 ENCOUNTER — Other Ambulatory Visit (HOSPITAL_COMMUNITY)
Admission: RE | Admit: 2020-11-04 | Discharge: 2020-11-04 | Disposition: A | Discharge status: Home / Self Care | Payer: Medicare Other | Source: Ambulatory Visit | Attending: Cardiovascular Disease | Admitting: Cardiovascular Disease

## 2020-11-04 DIAGNOSIS — I340 Nonrheumatic mitral (valve) insufficiency: Secondary | ICD-10-CM

## 2020-11-04 DIAGNOSIS — I511 Rupture of chordae tendineae, not elsewhere classified: Secondary | ICD-10-CM | POA: Diagnosis not present

## 2020-11-04 DIAGNOSIS — I493 Ventricular premature depolarization: Secondary | ICD-10-CM | POA: Insufficient documentation

## 2020-11-04 DIAGNOSIS — Z006 Encounter for examination for normal comparison and control in clinical research program: Secondary | ICD-10-CM | POA: Diagnosis not present

## 2020-11-04 DIAGNOSIS — Z20822 Contact with and (suspected) exposure to covid-19: Secondary | ICD-10-CM | POA: Insufficient documentation

## 2020-11-04 DIAGNOSIS — R2689 Other abnormalities of gait and mobility: Secondary | ICD-10-CM | POA: Insufficient documentation

## 2020-11-04 DIAGNOSIS — N3090 Cystitis, unspecified without hematuria: Secondary | ICD-10-CM | POA: Diagnosis not present

## 2020-11-04 DIAGNOSIS — I5032 Chronic diastolic (congestive) heart failure: Secondary | ICD-10-CM | POA: Diagnosis not present

## 2020-11-04 DIAGNOSIS — I272 Pulmonary hypertension, unspecified: Secondary | ICD-10-CM | POA: Diagnosis not present

## 2020-11-04 DIAGNOSIS — I110 Hypertensive heart disease with heart failure: Secondary | ICD-10-CM | POA: Diagnosis not present

## 2020-11-04 DIAGNOSIS — Z01818 Encounter for other preprocedural examination: Secondary | ICD-10-CM | POA: Insufficient documentation

## 2020-11-04 DIAGNOSIS — E669 Obesity, unspecified: Secondary | ICD-10-CM | POA: Diagnosis not present

## 2020-11-04 DIAGNOSIS — Z884 Allergy status to anesthetic agent status: Secondary | ICD-10-CM | POA: Diagnosis not present

## 2020-11-04 DIAGNOSIS — M109 Gout, unspecified: Secondary | ICD-10-CM | POA: Diagnosis not present

## 2020-11-04 DIAGNOSIS — H35033 Hypertensive retinopathy, bilateral: Secondary | ICD-10-CM | POA: Diagnosis not present

## 2020-11-04 HISTORY — DX: Dyspnea, unspecified: R06.00

## 2020-11-04 LAB — URINALYSIS, ROUTINE W REFLEX MICROSCOPIC
Bilirubin Urine: NEGATIVE
Glucose, UA: NEGATIVE mg/dL
Hgb urine dipstick: NEGATIVE
Ketones, ur: NEGATIVE mg/dL
Nitrite: POSITIVE — AB
Protein, ur: NEGATIVE mg/dL
Specific Gravity, Urine: 1.018 (ref 1.005–1.030)
WBC, UA: >50 WBC/hpf — ABNORMAL HIGH (ref 0–5)
pH: 5 (ref 5.0–8.0)

## 2020-11-04 LAB — CBC
HCT: 41.2 % (ref 36.0–46.0)
Hemoglobin: 13.7 g/dL (ref 12.0–15.0)
MCH: 30.1 pg (ref 26.0–34.0)
MCHC: 33.3 g/dL (ref 30.0–36.0)
MCV: 90.5 fL (ref 80.0–100.0)
Platelets: 220 10*3/uL (ref 150–400)
RBC: 4.55 MIL/uL (ref 3.87–5.11)
RDW: 15.2 % (ref 11.5–15.5)
WBC: 9.8 10*3/uL (ref 4.0–10.5)
nRBC: 0 % (ref 0.0–0.2)

## 2020-11-04 LAB — SURGICAL PCR SCREEN
MRSA, PCR: NEGATIVE
Staphylococcus aureus: POSITIVE — AB

## 2020-11-04 LAB — SARS CORONAVIRUS 2 (TAT 6-24 HRS): SARS Coronavirus 2: NEGATIVE

## 2020-11-04 LAB — BLOOD GAS, ARTERIAL
Acid-Base Excess: 1.4 mmol/L (ref 0.0–2.0)
Bicarbonate: 25.4 mmol/L (ref 20.0–28.0)
Drawn by: 58793
FIO2: 21
O2 Saturation: 97.9 %
Patient temperature: 37
pCO2 arterial: 39 mmHg (ref 32.0–48.0)
pH, Arterial: 7.429 (ref 7.350–7.450)
pO2, Arterial: 107 mmHg (ref 83.0–108.0)

## 2020-11-04 LAB — COMPREHENSIVE METABOLIC PANEL
ALT: 18 U/L (ref 0–44)
AST: 17 U/L (ref 15–41)
Albumin: 3.9 g/dL (ref 3.5–5.0)
Alkaline Phosphatase: 66 U/L (ref 38–126)
Anion gap: 11 (ref 5–15)
BUN: 21 mg/dL (ref 8–23)
CO2: 23 mmol/L (ref 22–32)
Calcium: 9.5 mg/dL (ref 8.9–10.3)
Chloride: 105 mmol/L (ref 98–111)
Creatinine, Ser: 0.63 mg/dL (ref 0.44–1.00)
GFR, Estimated: >60 mL/min (ref >60)
Glucose, Bld: 102 mg/dL — ABNORMAL HIGH (ref 70–99)
Potassium: 4.4 mmol/L (ref 3.5–5.1)
Sodium: 139 mmol/L (ref 135–145)
Total Bilirubin: 1 mg/dL (ref 0.3–1.2)
Total Protein: 6.6 g/dL (ref 6.5–8.1)

## 2020-11-04 LAB — TYPE AND SCREEN
ABO/RH(D): O POS
Antibody Screen: NEGATIVE

## 2020-11-04 LAB — PROTIME-INR
INR: 1 (ref 0.8–1.2)
Prothrombin Time: 12.8 seconds (ref 11.4–15.2)

## 2020-11-04 MED ORDER — CIPROFLOXACIN HCL 500 MG PO TABS: 500.0000 mg | ORAL_TABLET | Freq: Two times a day (BID) | ORAL | 0 refills | Status: DC

## 2020-11-04 NOTE — Telephone Encounter (Signed)
Patient is returning phone call. Please call back 

## 2020-11-04 NOTE — Progress Notes (Signed)
Messaged Theodosia Quay, RN of abnormal UA.

## 2020-11-04 NOTE — Telephone Encounter (Signed)
Called in Abx for UTI

## 2020-11-04 NOTE — Therapy (Signed)
[place], [place], [address] Phone: [phone]   Fax:  [phone]  Physical Therapy Pre-MitraClip Evaluation  Patient Details  Name: Erin [name] MRN: [id number] Date of Birth: [birth year] Referring Provider (PT): Sherren Mocha, MD   Encounter Date: 11/04/2020   PT End of Session - 11/04/20 1302    Visit Number 1    Number of Visits 1    Authorization Type MCR    PT Start Time 1215    PT Stop Time 1245    PT Time Calculation (min) 30 min    Equipment Utilized During Treatment Gait belt    Activity Tolerance Patient tolerated treatment well    Behavior During Therapy [facility] for tasks assessed/performed           Past Medical History:  Diagnosis Date  . Achilles tendon rupture    left, no surgery required  . Hypertension   . Hypertensive retinopathy of both eyes    Dr. Herbert Deaner  . Low back pain   . Lower extremity edema    lasix prn  . OA (osteoarthritis)   . Osteopenia   . Severe mitral regurgitation    2021 transthoracic echo and TEE->cardiology following  . Severe pulmonary hypertension ([place]) 2021   transth echo; moderate by TEE 08/2020  . Systolic murmur 76/28/3151   severe mitral regurge, mod/severe tricuspic regurg    Past Surgical History:  Procedure Laterality Date  . APPENDECTOMY     86 yrs old  . CHOLECYSTECTOMY    . LUMBAR LAMINECTOMY  2004  . RIGHT/LEFT HEART CATH AND CORONARY ANGIOGRAPHY N/A 09/16/2020   No CAD. Procedure: RIGHT/LEFT HEART CATH AND CORONARY ANGIOGRAPHY;  Surgeon: Belva Crome, MD;  Location: [place] [facility];  Service: Cardiovascular;  Laterality: N/A;  . TEE WITHOUT CARDIOVERSION N/A 09/16/2020   Procedure: TRANSESOPHAGEAL ECHOCARDIOGRAM (TEE);  Surgeon: Buford Dresser, MD;  Location: [facility] ENDOSCOPY;  Service: Cardiovascular;  Laterality: N/A;  . TRANSTHORACIC ECHOCARDIOGRAM  07/03/2020   TTE EF 60-65%, grd II DD, severe pulm art HTN, severe  mitral valve regurg, mod/sev tricuspic valve regurg. Conf on TEE 08/2020    There were no vitals filed for this visit.    Subjective Assessment - 11/04/20 1226    Subjective Patient reports heart problem leading to shortness of breath and fatigue that limits her activity level. Patient also notes chronic left ankle and knee pain with activities such as walking and persistent back pain with walking and standing.    Pertinent History History of mitral valve prolapse with severe symptomatic mitral regurgitation, chronic diastolic congestive heart failure, hypertension and osteoarthritis; past history of lumbar surgery in 2004, left ankle injury 2019    Limitations Walking;House hold activities;Standing    How long can you sit comfortably? No limitation    How long can you stand comfortably? 5 minutes    How long can you walk comfortably? Less than a block    Patient Stated Goals Get heart better    Currently in Pain? No/denies              [facility] PT Assessment - 11/04/20 0001      Assessment   Medical Diagnosis Severe Mitral Valve Insufficiency    Referring Provider (PT) Sherren Mocha, MD    Onset Date/Surgical Date --   worsening over past 3-4 months   Hand Dominance Right    Next MD Visit 11/07/2019    Prior Therapy None  Precautions   Precautions None      Restrictions   Weight Bearing Restrictions No      Balance Screen   Has the patient fallen in the past 6 months No    Has the patient had a decrease in activity level because of a fear of falling?  No    Is the patient reluctant to leave their home because of a fear of falling?  No      Home Environment   Living Environment Private residence    Living Arrangements Spouse/significant other    Type of [place] to enter    Entrance Stairs-Number of Steps 8    Entrance Stairs-Rails Left    Home Layout Multi-level    Alternate Level Stairs-Number of Steps 5    Alternate Level Stairs-Rails  Left      Prior Function   Level of Independence Independent    Vocation Retired    Leisure None reported      Cognition   Overall Cognitive Status Within Functional Limits for tasks assessed      Observation/Other Assessments   Observations Patient appears in no apparent distress    Focus on Therapeutic Outcomes (FOTO)  NA      Sensation   Light Touch Appears Intact      Coordination   Gross Motor Movements are Fluid and Coordinated Yes      Posture/Postural Control   Posture Comments Patient exhibits rounded shoulder posture, bilateral genu varus at knees      ROM / Strength   AROM / PROM / Strength Strength;AROM      AROM   Overall AROM Comments ROM grossly WFL throughout BUE and BLE      Strength   Overall Strength Comments Strength grossly 4+/5 MMT throughout BUE and BLE    Strength Assessment Site Hand    Right/Left hand Right;Left    Right Hand Grip (lbs) 36    Left Hand Grip (lbs) 32      Transfers   Transfers Independent with all Transfers      Ambulation/Gait   Ambulation/Gait Yes    Ambulation/Gait Assistance 7: Independent    Ambulation Distance (Feet) 440 Feet    Gait Comments Patient exhibits antalgic gait on left side, genu varus of knees, wide BOS            [facility] Pre-Surgical Assessment - 11/04/20 0001    5 Meter Walk Test- trial 1 5 sec    5 Meter Walk Test- trial 2 5 sec.     5 Meter Walk Test- trial 3 5 sec.    5 meter walk test average 5 sec    4 Stage Balance Test tolerated for:  3 sec.    4 Stage Balance Test Position 3    Sit To Stand Test- trial 1 17 sec.    ADL/IADL Independent with: Bathing;Dressing;Meal prep;Finances    ADL/IADL Needs Assistance with: [employer] Leaver work    ADL/IADL Fraility Index Vulnerable    6 Minute Walk- Baseline yes    BP (mmHg) 144/105    HR (bpm) 60    02 Sat (%RA) 97 %    Modified Borg Scale for Dyspnea 0.5- Very, very slight shortness of breath    Perceived Rate of Exertion (Borg) 6-    6 Minute Walk Post  Test yes    BP (mmHg) 163/87    HR (bpm) 75    02 Sat (%RA) 99 %  Modified Borg Scale for Dyspnea 4- somewhat severe    Perceived Rate of Exertion (Borg) 13- Somewhat hard    Aerobic Endurance Distance Walked 440    Endurance additional comments patient reported most limitation due to left ankle/knee discmfort and weakness                    Objective measurements completed on examination: See above findings.               PT Education - 11/04/20 1301    Education Details Exam findings, energy conservation    Person(s) Educated Patient    Methods Explanation    Comprehension Verbalized understanding                       Plan - 11/04/20 1302    Clinical Impression Statement See below:    Personal Factors and Comorbidities Age;Fitness;Time since onset of injury/illness/exacerbation;Comorbidity 3+;Past/Current Experience    Comorbidities History of mitral valve prolapse with severe symptomatic mitral regurgitation, chronic diastolic congestive heart failure, hypertension and osteoarthritis; past history of lumbar surgery in 2004, left ankle injury 2019    Examination-Activity Limitations Locomotion Level;Squat;Stairs;Stand    Examination-Participation Restrictions Community Activity;Shop;Yard Work    Stability/Clinical Decision Making Stable/Uncomplicated    Clinical Decision Making Low    Rehab Potential Good    PT Frequency One time visit    PT Treatment/Interventions ADLs/Self Care Home Management;Gait training;Stair training;Therapeutic activities;Therapeutic exercise;Balance training;Neuromuscular re-education;Patient/family education;Energy conservation    PT Next Visit Plan NA    PT Home Exercise Plan NA    Consulted and Agree with Plan of Care Patient           Clinical Impression Statement: Pt is a 86 yo female presenting to OP PT for evaluation prior to possible MitraClip surgery due to Severe Mitral Valve Insufficiency. Pt reports  onset of shortness of breath and fatigue approximately worsening 3-4 months ago. Symptoms are limiting household and community activity level. Pt presents with good ROM and strength, moderate balance and is not at high fall risk 4 stage balance test, good walking speed and poor aerobic endurance per 6 minute walk test. Pt ambulated 185 feet in 1:55 before requesting a seated rest beak lasting 1:10. At time of rest, patient's HR was 91 bpm and O2 was 98 on room air. Pt reported 4/10 shortness of breath on modified scale for dyspnea. Pt able to resume after rest and ambulate an additional 255 feet in 2:00 before requesting a seated rest beak until end of 6 minute test. Pt ambulated a total of 440 feet in 6 minute walk. Shortness of breath and RPE increased with 6 minute walk test. Based on the Short Physical Performance Battery, patient has a frailty rating of 8/12 with </= 5/12 considered frail.   Patient demonstrates the following deficits and impairments:  Abnormal gait,Difficulty walking,Decreased activity tolerance,Pain,Decreased endurance,Postural dysfunction,Decreased strength,Decreased balance,Cardiopulmonary status limiting activity  Visit Diagnosis: Other abnormalities of gait and mobility     Problem List Patient Active Problem List   Diagnosis Date Noted  . Nonrheumatic mitral valve regurgitation   . Orthopnea 01/30/2020  . Pain in left foot 08/27/2019  . Osteoarthritis of right knee 05/08/2018  . Osteoarthritis of left knee 05/08/2018  . Achilles rupture, left 02/10/2018  . Leg cramps 03/24/2017  . Achilles tendinitis 09/22/2016  . Fatigue 09/22/2016  . Skin infection 10/13/2015  . Paresthesia of both feet 10/09/2015  . RLS (restless legs syndrome) 10/09/2015  .  URI, acute 11/30/2013  . Chest pain 11/30/2013  . CVA (cerebral vascular accident) ([place]) 09/19/2012  . Hematuria, gross 10/27/2011  . Obesity 09/09/2011  . Contusion of knee, left 01/12/2011  . Cystitis 05/21/2010   . NECK PAIN 05/21/2010  . Edema 02/12/2009  . WEIGHT GAIN 02/12/2009  . CRAMPS,LEG 11/11/2008  . BOILS, RECURRENT 02/01/2008  . HEARING IMPAIRMENT 01/25/2008  . RASH AND OTHER NONSPECIFIC SKIN ERUPTION 11/10/2007  . Essential hypertension 10/21/2007  . OSTEOARTHRITIS 10/21/2007  . LOW BACK PAIN 10/21/2007  . OSTEOPENIA 10/21/2007  . Gout 10/20/2007  . LEG PAIN 10/20/2007    Hilda Blades, PT, DPT, LAT, ATC 11/04/20  1:12 PM Phone: [phone] Fax: [facility] [address] [place], [place], [address] Phone: [phone]   Fax:  [phone]  Name: Erin [name] MRN: [id number] Date of Birth: 1935-03-04

## 2020-11-04 NOTE — Progress Notes (Addendum)
PCP - Shawnie Dapper Cardiologist - cooper   Chest x-ray - 11/04/20 EKG - 11/04/20 Stress Test -  na ECHO - 09/16/20 Cardiac Cath - 09/16/20  Sleep Study - na   Blood Thinner Instructions: na Aspirin Instructions: na    COVID TEST- 11/04/20   Anesthesia review: cardiac hx,  Patient denies shortness of breath, fever, cough and chest pain at PAT appointment   All instructions explained to the patient, with a verbal understanding of the material. Patient agrees to go over the instructions while at home for a better understanding. Patient also instructed to self quarantine after being tested for COVID-19. The opportunity to ask questions was provided.

## 2020-11-05 MED ORDER — TRANEXAMIC ACID (OHS) BOLUS VIA INFUSION
15.0000 mg/kg | INTRAVENOUS | Status: DC
  Filled 2020-11-05 (×2): qty 846

## 2020-11-05 MED ORDER — SODIUM CHLORIDE 0.9 % IV SOLN
INTRAVENOUS | Status: DC
  Filled 2020-11-05 (×2): qty 30

## 2020-11-05 MED ORDER — EPINEPHRINE HCL 5 MG/250ML IV SOLN IN NS
0.0000 ug/min | INTRAVENOUS | Status: DC
  Filled 2020-11-05 (×2): qty 250

## 2020-11-05 MED ORDER — TRANEXAMIC ACID (OHS) PUMP PRIME SOLUTION
2.0000 mg/kg | INTRAVENOUS | Status: DC
  Filled 2020-11-05 (×2): qty 1.13

## 2020-11-05 MED ORDER — MILRINONE LACTATE IN DEXTROSE 20-5 MG/100ML-% IV SOLN
0.3000 ug/kg/min | INTRAVENOUS | Status: DC
  Filled 2020-11-05 (×2): qty 100

## 2020-11-05 MED ORDER — TRANEXAMIC ACID 1000 MG/10ML IV SOLN
1.5000 mg/kg/h | INTRAVENOUS | Status: DC
  Filled 2020-11-05 (×2): qty 25

## 2020-11-05 MED ORDER — SODIUM CHLORIDE 0.9 % IV SOLN
1.5000 g | INTRAVENOUS | Status: AC
  Administered 2020-11-06: 1.5 g via INTRAVENOUS
  Filled 2020-11-05 (×2): qty 1.5

## 2020-11-05 MED ORDER — VANCOMYCIN HCL 1250 MG/250ML IV SOLN
1250.0000 mg | INTRAVENOUS | Status: DC
  Filled 2020-11-05 (×2): qty 250

## 2020-11-05 MED ORDER — DEXMEDETOMIDINE HCL IN NACL 400 MCG/100ML IV SOLN
0.1000 ug/kg/h | INTRAVENOUS | Status: DC
  Filled 2020-11-05 (×2): qty 100

## 2020-11-05 MED ORDER — NITROGLYCERIN IN D5W 200-5 MCG/ML-% IV SOLN
2.0000 ug/min | INTRAVENOUS | Status: DC
  Filled 2020-11-05: qty 250

## 2020-11-05 MED ORDER — NOREPINEPHRINE 4 MG/250ML-% IV SOLN
0.0000 ug/min | INTRAVENOUS | Status: DC
  Filled 2020-11-05: qty 250

## 2020-11-05 MED ORDER — POTASSIUM CHLORIDE 2 MEQ/ML IV SOLN
80.0000 meq | INTRAVENOUS | Status: DC
  Filled 2020-11-05 (×2): qty 40

## 2020-11-05 MED ORDER — INSULIN REGULAR(HUMAN) IN NACL 100-0.9 UT/100ML-% IV SOLN
INTRAVENOUS | Status: DC
  Filled 2020-11-05: qty 100

## 2020-11-05 MED ORDER — MANNITOL 20 % IV SOLN
Freq: Once | INTRAVENOUS | Status: DC
  Filled 2020-11-05 (×2): qty 13

## 2020-11-05 MED ORDER — PHENYLEPHRINE HCL-NACL 20-0.9 MG/250ML-% IV SOLN
30.0000 ug/min | INTRAVENOUS | Status: AC
  Administered 2020-11-06: 50 ug/min via INTRAVENOUS
  Filled 2020-11-05 (×2): qty 250

## 2020-11-05 MED ORDER — SODIUM CHLORIDE 0.9 % IV SOLN
750.0000 mg | INTRAVENOUS | Status: DC
  Filled 2020-11-05 (×2): qty 750

## 2020-11-05 MED ORDER — PLASMA-LYTE 148 IV SOLN
INTRAVENOUS | Status: DC
  Filled 2020-11-05 (×2): qty 2.5

## 2020-11-05 NOTE — Anesthesia Preprocedure Evaluation (Addendum)
Anesthesia Evaluation  Patient identified by MRN, date of birth, ID band Patient awake    Reviewed: Allergy & Precautions, NPO status , Patient's Chart, lab work & pertinent test results  Airway Mallampati: II  TM Distance: >3 FB Neck ROM: Full    Dental  (+) Teeth Intact, Dental Advisory Given   Pulmonary    breath sounds clear to auscultation       Cardiovascular hypertension, Pt. on home beta blockers  Rhythm:Regular Rate:Normal     Neuro/Psych CVA negative psych ROS   GI/Hepatic negative GI ROS, Neg liver ROS,   Endo/Other  negative endocrine ROS  Renal/GU negative Renal ROS     Musculoskeletal  (+) Arthritis ,   Abdominal Normal abdominal exam  (+)   Peds  Hematology negative hematology ROS (+)   Anesthesia Other Findings   Reproductive/Obstetrics                           Anesthesia Physical Anesthesia Plan  ASA: IV  Anesthesia Plan: General   Post-op Pain Management:    Induction: Intravenous  PONV Risk Score and Plan: 3 and Ondansetron, Dexamethasone and Treatment may vary due to age or medical condition  Airway Management Planned: Oral ETT  Additional Equipment: Arterial line  Intra-op Plan:   Post-operative Plan: Extubation in OR  Informed Consent: I have reviewed the patients History and Physical, chart, labs and discussed the procedure including the risks, benefits and alternatives for the proposed anesthesia with the patient or authorized representative who has indicated his/her understanding and acceptance.       Plan Discussed with: CRNA  Anesthesia Plan Comments: (PAT note written 11/05/2020 by Myra Gianotti, PA-C.  EKG: normal sinus rhythm, occasional PVC noted, unifocal.  Echo: 1. Left ventricular ejection fraction, by estimation, is 60 to 65%. The  left ventricle has normal function.  2. Right ventricular systolic function is normal. The right  ventricular  size is normal.  3. No left atrial/left atrial appendage thrombus was detected.  4. There is partial flail of P2 with chordae attached. There is severe  eccentric mitral valve regurgitation directed anteriorly. . The mitral  valve is abnormal. Severe mitral valve regurgitation. No evidence of  mitral stenosis.  5. Tricuspid valve regurgitation is moderate to severe.  6. The aortic valve is tricuspid. Aortic valve regurgitation is trivial.  No aortic stenosis is present.  7. There is Moderate (Grade III) plaque involving the descending aorta.  8. Possible shunting.   )      Anesthesia Quick Evaluation

## 2020-11-05 NOTE — Progress Notes (Signed)
Anesthesia Chart Review:  Case: [id number] Date/Time: 11/06/20 0730   Procedures:      MITRAL VALVE REPAIR (N/A )     TRANSESOPHAGEAL ECHOCARDIOGRAM (TEE) (N/A )   Anesthesia type: General   Pre-op diagnosis: Severe Mitral Insufficiency   Location: [facility] CATH [facility] 6 / [place] INVASIVE CV LAB   Providers: Sherren Mocha, MD      DISCUSSION: Patient is an 86 year old female scheduled for the above procedure.  History includes never smoker, murmur/severe MR, pulmonary hypertension (RVSP 60.4 mmHg 07/03/20 echo; mean PAP 29 mmHg 09/16/20 RHC), HTN, hypertensive retinopathy, exertional dyspnea, lower extremity edema, back surgery (L3-4 discectomy 05/15/02).  11/04/2020 preprocedure COVID-19 test negative.  Anesthesia team to evaluate on the day of surgery.   VS: BP (!) 151/73   Pulse (!) 58   Temp 36.5 C (Oral)   Resp 17   Ht 5\' 3"  (1.6 m)   Wt 56.4 kg   SpO2 100%   BMI 22.02 kg/m    PROVIDERS: McGowen, Adrian Blackwater, MD is PCP  Daneen Schick, MD is cardiologist Sherren Mocha, MD is structural heart cardiologist   LABS: Preoperative labs noted.  Cardiology called in antibiotics for abnormal UA/UTI (all labs ordered are listed, but only abnormal results are displayed)  Labs Reviewed  SURGICAL PCR SCREEN - Abnormal; Notable for the following components:      Result Value   Staphylococcus aureus POSITIVE (*)    All other components within normal limits  COMPREHENSIVE METABOLIC PANEL - Abnormal; Notable for the following components:   Glucose, Bld 102 (*)    All other components within normal limits  URINALYSIS, ROUTINE W REFLEX MICROSCOPIC - Abnormal; Notable for the following components:   APPearance HAZY (*)    Nitrite POSITIVE (*)    Leukocytes,Ua LARGE (*)    WBC, UA >50 (*)    Bacteria, UA MANY (*)    All other components within normal limits  BLOOD GAS, ARTERIAL - Abnormal; Notable for the following components:   Allens test (pass/fail) BRACHIAL ARTERY (*)    All other components  within normal limits  CBC  PROTIME-INR  TYPE AND SCREEN     IMAGES: CXR 11/04/20: FINDINGS: Moderate cardiomegaly is unchanged. No focal airspace disease or pulmonary edema. No pleural effusion or pneumothorax. IMPRESSION: Unchanged moderate cardiomegaly without pulmonary edema.   EKG: 11/04/2020: Sinus rhythm with occasional Premature ventricular complexes Possible Left atrial enlargement Borderline ECG No significant change since last tracing Confirmed by Oswaldo Milian [phone]) on 11/04/2020 9:07:23 PM   CV: RHC/LHC 09/16/20:  Normal coronary arteries with right dominant anatomy.  Mild pulmonary hypertension with mean pulmonary artery pressure 29 mmHg.  Pulmonary capillary wedge mean 17 mmHg and left ventricular end-diastolic pressure 17 mmHg.  Pulmonary vascular resistance 2.39 Woods units.  WHO group 2 etiology.  Severe mitral regurgitation documented by TEE and confirmed by hemodynamic recordings with V waves spiking to 41 mmHg.  Left ventricular systolic function is poorly assessed by the current study.   TEE 09/16/20: IMPRESSIONS  1. Left ventricular ejection fraction, by estimation, is 60 to 65%. The  left ventricle has normal function.  2. Right ventricular systolic function is normal. The right ventricular  size is normal.  3. No left atrial/left atrial appendage thrombus was detected.  4. There is partial flail of P2 with chordae attached. There is severe  eccentric mitral valve regurgitation directed anteriorly. . The mitral  valve is abnormal. Severe mitral valve regurgitation. No evidence of  mitral  stenosis.  5. Tricuspid valve regurgitation is moderate to severe.  6. The aortic valve is tricuspid. Aortic valve regurgitation is trivial.  No aortic stenosis is present.  7. There is Moderate (Grade III) plaque involving the descending aorta.  8. Possible shunting.  - Conclusion(s)/Recommendation(s): Severe eccentric mitral valve   regurgitation with P2 segment flail with mobile chordae.    TTE 07/03/20: IMPRESSIONS  1. Left ventricular ejection fraction, by estimation, is 60 to 65%. The  left ventricle has normal function. The left ventricle has no regional  wall motion abnormalities. There is mild left ventricular hypertrophy.  Left ventricular diastolic parameters  are consistent with Grade II diastolic dysfunction (pseudonormalization).  2. Right ventricular systolic function is normal. The right ventricular  size is normal. There is severely elevated pulmonary artery systolic  pressure. The estimated right ventricular systolic pressure is [id number] mmHg.  3. Left atrial size was severely dilated.  4. Right atrial size was moderately dilated.  5. The mitral valve is abnormal. There appears to be partial flail of the  P2 segment of the posterior mitral leaflet with possibly a ruptured chord.  Severe mitral valve regurgitation, anteriorly-directed. No evidence of  mitral stenosis.  6. Tricuspid valve regurgitation is moderate to severe.  7. The aortic valve is tricuspid. Aortic valve regurgitation is trivial.  No aortic stenosis is present.  8. The inferior vena cava is normal in size with <50% respiratory  variability, suggesting right atrial pressure of 8 mmHg.    Carotid US 09/22/12: Impressions: Essentially normal carotid arteries, bilaterally.   Patent vertebral arteries with antegrade flow.   Past Medical History:  Diagnosis Date  . Achilles tendon rupture    left, no surgery required  . Dyspnea    on exertion  . Hypertension   . Hypertensive retinopathy of both eyes    Dr. Herbert Deaner  . Low back pain   . Lower extremity edema    lasix prn  . OA (osteoarthritis)   . Osteopenia   . Severe mitral regurgitation    2021 transthoracic echo and TEE->cardiology following  . Severe pulmonary hypertension ([place]) 2021   transth echo; moderate by TEE 08/2020  . Systolic murmur 0000000   severe  mitral regurge, mod/severe tricuspic regurg    Past Surgical History:  Procedure Laterality Date  . APPENDECTOMY     86 yrs old  . CHOLECYSTECTOMY    . LUMBAR LAMINECTOMY  2004  . RIGHT/LEFT HEART CATH AND CORONARY ANGIOGRAPHY N/A 09/16/2020   No CAD. Procedure: RIGHT/LEFT HEART CATH AND CORONARY ANGIOGRAPHY;  Surgeon: Belva Crome, MD;  Location: [place] CV LAB;  Service: Cardiovascular;  Laterality: N/A;  . TEE WITHOUT CARDIOVERSION N/A 09/16/2020   Procedure: TRANSESOPHAGEAL ECHOCARDIOGRAM (TEE);  Surgeon: Buford Dresser, MD;  Location[facility] ENDOSCOPY;  Service: Cardiovascular;  Laterality: N/A;  . TONSILLECTOMY    . TRANSTHORACIC ECHOCARDIOGRAM  07/03/2020   TTE EF 60-65%, grd II DD, severe pulm art HTN, severe mitral valve regurg, mod/sev tricuspic valve regurg. Conf on TEE 08/2020    MEDICATIONS: . Ascorbic Acid (VITAMIN C PO)  . carvedilol (COREG) 25 MG tablet  . ciprofloxacin (CIPRO) 500 MG tablet  . diazepam (VALIUM) 2 MG tablet  . furosemide (LASIX) 20 MG tablet  . loratadine (CLARITIN) 10 MG tablet  . melatonin 5 MG TABS  . Multiple Vitamin (MULTIVITAMIN WITH MINERALS) TABS tablet  . Multiple Vitamins-Minerals (PRESERVISION AREDS PO)  . Multiple Vitamins-Minerals (ZINC PO)  . nitroGLYCERIN (NITRODUR - DOSED IN MG/24  HR) 0.2 mg/hr patch  . potassium chloride (KLOR-CON) 10 MEQ tablet  . tretinoin (RETIN-A) 0.1 % cream  . VITAMIN D PO  . VITAMIN E PO   No current facility-administered medications for this encounter.    Myra Gianotti, PA-C Surgical Short Stay/Anesthesiology [facility] Phone [phone] [facility] Phone [phone] 11/05/2020 9:25 AM

## 2020-11-06 ENCOUNTER — Other Ambulatory Visit: Payer: Self-pay | Admitting: Physician Assistant

## 2020-11-06 ENCOUNTER — Encounter (HOSPITAL_COMMUNITY): Payer: Self-pay | Admitting: Cardiovascular Disease

## 2020-11-06 ENCOUNTER — Other Ambulatory Visit: Payer: Self-pay

## 2020-11-06 ENCOUNTER — Inpatient Hospital Stay (HOSPITAL_COMMUNITY): Payer: Medicare Other | Admitting: Vascular Surgery

## 2020-11-06 ENCOUNTER — Inpatient Hospital Stay (HOSPITAL_COMMUNITY): Payer: Medicare Other

## 2020-11-06 ENCOUNTER — Inpatient Hospital Stay (HOSPITAL_COMMUNITY)
Admission: RE | Admit: 2020-11-06 | Discharge: 2020-11-07 | DRG: 266 | Disposition: A | Discharge status: Home / Self Care | Payer: Medicare Other | Attending: Cardiovascular Disease | Admitting: Cardiovascular Disease

## 2020-11-06 ENCOUNTER — Encounter (HOSPITAL_COMMUNITY)
Admission: RE | Disposition: A | Discharge status: Home / Self Care | Payer: Medicare Other | Source: Home / Self Care | Attending: Cardiovascular Disease

## 2020-11-06 ENCOUNTER — Inpatient Hospital Stay (HOSPITAL_COMMUNITY): Payer: Medicare Other | Admitting: Certified Registered"

## 2020-11-06 DIAGNOSIS — Z6822 Body mass index (BMI) 22.0-22.9, adult: Secondary | ICD-10-CM | POA: Diagnosis not present

## 2020-11-06 DIAGNOSIS — Z884 Allergy status to anesthetic agent status: Secondary | ICD-10-CM | POA: Diagnosis not present

## 2020-11-06 DIAGNOSIS — M8580 Other specified disorders of bone density and structure, unspecified site: Secondary | ICD-10-CM | POA: Diagnosis present

## 2020-11-06 DIAGNOSIS — M109 Gout, unspecified: Secondary | ICD-10-CM | POA: Diagnosis present

## 2020-11-06 DIAGNOSIS — E669 Obesity, unspecified: Secondary | ICD-10-CM | POA: Diagnosis present

## 2020-11-06 DIAGNOSIS — I5032 Chronic diastolic (congestive) heart failure: Secondary | ICD-10-CM | POA: Diagnosis present

## 2020-11-06 DIAGNOSIS — Z888 Allergy status to other drugs, medicaments and biological substances status: Secondary | ICD-10-CM | POA: Diagnosis not present

## 2020-11-06 DIAGNOSIS — Z006 Encounter for examination for normal comparison and control in clinical research program: Secondary | ICD-10-CM

## 2020-11-06 DIAGNOSIS — I340 Nonrheumatic mitral (valve) insufficiency: Secondary | ICD-10-CM

## 2020-11-06 DIAGNOSIS — Z9889 Other specified postprocedural states: Secondary | ICD-10-CM

## 2020-11-06 DIAGNOSIS — Z95818 Presence of other cardiac implants and grafts: Secondary | ICD-10-CM

## 2020-11-06 DIAGNOSIS — I110 Hypertensive heart disease with heart failure: Secondary | ICD-10-CM | POA: Diagnosis present

## 2020-11-06 DIAGNOSIS — Z885 Allergy status to narcotic agent status: Secondary | ICD-10-CM | POA: Diagnosis not present

## 2020-11-06 DIAGNOSIS — Z8673 Personal history of transient ischemic attack (TIA), and cerebral infarction without residual deficits: Secondary | ICD-10-CM

## 2020-11-06 DIAGNOSIS — I511 Rupture of chordae tendineae, not elsewhere classified: Secondary | ICD-10-CM | POA: Diagnosis present

## 2020-11-06 DIAGNOSIS — Z20822 Contact with and (suspected) exposure to covid-19: Secondary | ICD-10-CM | POA: Diagnosis present

## 2020-11-06 DIAGNOSIS — N3090 Cystitis, unspecified without hematuria: Secondary | ICD-10-CM | POA: Diagnosis present

## 2020-11-06 DIAGNOSIS — H35033 Hypertensive retinopathy, bilateral: Secondary | ICD-10-CM | POA: Diagnosis present

## 2020-11-06 DIAGNOSIS — I10 Essential (primary) hypertension: Secondary | ICD-10-CM | POA: Diagnosis present

## 2020-11-06 DIAGNOSIS — I272 Pulmonary hypertension, unspecified: Secondary | ICD-10-CM | POA: Diagnosis present

## 2020-11-06 DIAGNOSIS — I639 Cerebral infarction, unspecified: Secondary | ICD-10-CM | POA: Diagnosis not present

## 2020-11-06 DIAGNOSIS — Z8249 Family history of ischemic heart disease and other diseases of the circulatory system: Secondary | ICD-10-CM | POA: Diagnosis not present

## 2020-11-06 DIAGNOSIS — I700 Atherosclerosis of aorta: Secondary | ICD-10-CM | POA: Diagnosis present

## 2020-11-06 DIAGNOSIS — Z87891 Personal history of nicotine dependence: Secondary | ICD-10-CM | POA: Diagnosis not present

## 2020-11-06 DIAGNOSIS — Z954 Presence of other heart-valve replacement: Secondary | ICD-10-CM | POA: Diagnosis not present

## 2020-11-06 HISTORY — DX: Other specified postprocedural states: Z98.890

## 2020-11-06 HISTORY — PX: MITRAL VALVE REPAIR: CATH118311

## 2020-11-06 HISTORY — PX: TEE WITHOUT CARDIOVERSION: SHX5443

## 2020-11-06 LAB — ECHO TEE
MV M vel: 4.34 m/s
MV Peak grad: 75.3 mmHg
Radius: 0.95 cm

## 2020-11-06 LAB — ABO/RH: ABO/RH(D): O POS

## 2020-11-06 SURGERY — MITRAL VALVE REPAIR
Anesthesia: General

## 2020-11-06 MED ORDER — DEXAMETHASONE SODIUM PHOSPHATE 10 MG/ML IJ SOLN
INTRAMUSCULAR | Status: DC | PRN
  Administered 2020-11-06: 5 mg via INTRAVENOUS

## 2020-11-06 MED ORDER — LIDOCAINE 2% (20 MG/ML) 5 ML SYRINGE
INTRAMUSCULAR | Status: DC | PRN
  Administered 2020-11-06: 60 mg via INTRAVENOUS

## 2020-11-06 MED ORDER — PHENYLEPHRINE HCL (PRESSORS) 10 MG/ML IV SOLN
INTRAVENOUS | Status: DC | PRN
  Administered 2020-11-06: 120 ug via INTRAVENOUS

## 2020-11-06 MED ORDER — MUPIROCIN 2 % EX OINT
1.0000 "application " | TOPICAL_OINTMENT | Freq: Two times a day (BID) | CUTANEOUS | Status: DC
  Administered 2020-11-06 – 2020-11-07 (×3): 1 via NASAL
  Filled 2020-11-06: qty 22

## 2020-11-06 MED ORDER — CIPROFLOXACIN HCL 500 MG PO TABS
500.0000 mg | ORAL_TABLET | Freq: Two times a day (BID) | ORAL | Status: DC
  Administered 2020-11-06 – 2020-11-07 (×3): 500 mg via ORAL
  Filled 2020-11-06 (×3): qty 1

## 2020-11-06 MED ORDER — HEPARIN (PORCINE) IN NACL 1000-0.9 UT/500ML-% IV SOLN
INTRAVENOUS | Status: AC
  Filled 2020-11-06: qty 500

## 2020-11-06 MED ORDER — VANCOMYCIN HCL 1250 MG/250ML IV SOLN: 1250.0000 mg | INTRAVENOUS | Status: DC

## 2020-11-06 MED ORDER — HYDRALAZINE HCL 20 MG/ML IJ SOLN: 10.0000 mg | Freq: Once | INTRAMUSCULAR | Status: DC

## 2020-11-06 MED ORDER — SODIUM CHLORIDE 0.9% FLUSH: 3.0000 mL | INTRAVENOUS | Status: DC | PRN

## 2020-11-06 MED ORDER — POTASSIUM CHLORIDE ER 10 MEQ PO TBCR
10.0000 meq | EXTENDED_RELEASE_TABLET | Freq: Every day | ORAL | Status: DC | PRN
  Filled 2020-11-06: qty 1

## 2020-11-06 MED ORDER — HEPARIN SODIUM (PORCINE) 1000 UNIT/ML IJ SOLN
INTRAMUSCULAR | Status: DC | PRN
Start: 2020-11-06 — End: 2020-11-06
  Administered 2020-11-06: 2000 [IU] via INTRAVENOUS
  Administered 2020-11-06: 6000 [IU] via INTRAVENOUS
  Administered 2020-11-06: 3000 [IU] via INTRAVENOUS

## 2020-11-06 MED ORDER — CLOPIDOGREL BISULFATE 75 MG PO TABS
75.0000 mg | ORAL_TABLET | Freq: Every day | ORAL | Status: DC
  Administered 2020-11-07: 75 mg via ORAL
  Filled 2020-11-06: qty 1

## 2020-11-06 MED ORDER — CHLORHEXIDINE GLUCONATE 4 % EX LIQD: 60.0000 mL | Freq: Once | CUTANEOUS | Status: DC

## 2020-11-06 MED ORDER — SODIUM CHLORIDE 0.9 % IV SOLN: 250.0000 mL | INTRAVENOUS | Status: DC | PRN

## 2020-11-06 MED ORDER — MIDAZOLAM HCL 5 MG/5ML IJ SOLN
INTRAMUSCULAR | Status: DC | PRN
  Administered 2020-11-06: 1 mg via INTRAVENOUS

## 2020-11-06 MED ORDER — CHLORHEXIDINE GLUCONATE 4 % EX LIQD: 30.0000 mL | CUTANEOUS | Status: DC

## 2020-11-06 MED ORDER — ONDANSETRON HCL 4 MG/2ML IJ SOLN: 4.0000 mg | Freq: Four times a day (QID) | INTRAMUSCULAR | Status: DC | PRN

## 2020-11-06 MED ORDER — CHLORHEXIDINE GLUCONATE CLOTH 2 % EX PADS: 6.0000 | MEDICATED_PAD | Freq: Every day | CUTANEOUS | Status: DC

## 2020-11-06 MED ORDER — EPHEDRINE SULFATE-NACL 50-0.9 MG/10ML-% IV SOSY
PREFILLED_SYRINGE | INTRAVENOUS | Status: DC | PRN
  Administered 2020-11-06: 5 mg via INTRAVENOUS

## 2020-11-06 MED ORDER — DIAZEPAM 2 MG PO TABS: 2.0000 mg | ORAL_TABLET | Freq: Every day | ORAL | Status: DC | PRN

## 2020-11-06 MED ORDER — MELATONIN 5 MG PO TABS
5.0000 mg | ORAL_TABLET | Freq: Every day | ORAL | Status: DC
  Administered 2020-11-06: 5 mg via ORAL
  Filled 2020-11-06: qty 1

## 2020-11-06 MED ORDER — CHLORHEXIDINE GLUCONATE 0.12 % MT SOLN
15.0000 mL | Freq: Once | OROMUCOSAL | Status: AC
  Administered 2020-11-06: 15 mL via OROMUCOSAL
  Filled 2020-11-06: qty 15

## 2020-11-06 MED ORDER — CARVEDILOL 25 MG PO TABS
25.0000 mg | ORAL_TABLET | Freq: Every day | ORAL | Status: DC
  Administered 2020-11-07: 25 mg via ORAL
  Filled 2020-11-06: qty 1

## 2020-11-06 MED ORDER — PROTAMINE SULFATE 10 MG/ML IV SOLN
INTRAVENOUS | Status: DC | PRN
  Administered 2020-11-06: 30 mg via INTRAVENOUS

## 2020-11-06 MED ORDER — LACTATED RINGERS IV SOLN: INTRAVENOUS | Status: DC | PRN

## 2020-11-06 MED ORDER — PROPOFOL 10 MG/ML IV BOLUS
INTRAVENOUS | Status: DC | PRN
  Administered 2020-11-06: 130 mg via INTRAVENOUS

## 2020-11-06 MED ORDER — SODIUM CHLORIDE 0.9 % IV SOLN: INTRAVENOUS | Status: DC

## 2020-11-06 MED ORDER — ROCURONIUM BROMIDE 10 MG/ML (PF) SYRINGE
PREFILLED_SYRINGE | INTRAVENOUS | Status: DC | PRN
  Administered 2020-11-06: 60 mg via INTRAVENOUS

## 2020-11-06 MED ORDER — LABETALOL HCL 5 MG/ML IV SOLN
20.0000 mg | Freq: Once | INTRAVENOUS | Status: AC
  Administered 2020-11-06: 20 mg via INTRAVENOUS

## 2020-11-06 MED ORDER — SUGAMMADEX SODIUM 200 MG/2ML IV SOLN
INTRAVENOUS | Status: DC | PRN
  Administered 2020-11-06: 100 mg via INTRAVENOUS

## 2020-11-06 MED ORDER — HEPARIN (PORCINE) IN NACL 2000-0.9 UNIT/L-% IV SOLN
INTRAVENOUS | Status: AC
  Filled 2020-11-06: qty 1000

## 2020-11-06 MED ORDER — FUROSEMIDE 20 MG PO TABS
20.0000 mg | ORAL_TABLET | ORAL | Status: DC
  Filled 2020-11-06: qty 1

## 2020-11-06 MED ORDER — FENTANYL CITRATE (PF) 100 MCG/2ML IJ SOLN
INTRAMUSCULAR | Status: DC | PRN
  Administered 2020-11-06: 100 ug via INTRAVENOUS

## 2020-11-06 MED ORDER — HEPARIN (PORCINE) IN NACL 2000-0.9 UNIT/L-% IV SOLN
INTRAVENOUS | Status: AC
  Filled 2020-11-06: qty 2000

## 2020-11-06 MED ORDER — LIDOCAINE HCL (PF) 1 % IJ SOLN
INTRAMUSCULAR | Status: DC | PRN
  Administered 2020-11-06: 8 mL

## 2020-11-06 MED ORDER — HEPARIN (PORCINE) IN NACL 2000-0.9 UNIT/L-% IV SOLN
INTRAVENOUS | Status: DC | PRN
  Administered 2020-11-06 (×3): 1000 mL

## 2020-11-06 MED ORDER — ASPIRIN 81 MG PO CHEW
CHEWABLE_TABLET | ORAL | Status: AC
  Filled 2020-11-06: qty 1

## 2020-11-06 MED ORDER — SODIUM CHLORIDE 0.9 % IV SOLN: 1.5000 g | INTRAVENOUS | Status: DC

## 2020-11-06 MED ORDER — ONDANSETRON HCL 4 MG/2ML IJ SOLN
INTRAMUSCULAR | Status: DC | PRN
  Administered 2020-11-06: 4 mg via INTRAVENOUS

## 2020-11-06 MED ORDER — ACETAMINOPHEN 325 MG PO TABS: 650.0000 mg | ORAL_TABLET | ORAL | Status: DC | PRN

## 2020-11-06 MED ORDER — SODIUM CHLORIDE 0.9% FLUSH
3.0000 mL | Freq: Two times a day (BID) | INTRAVENOUS | Status: DC
  Administered 2020-11-06 – 2020-11-07 (×2): 3 mL via INTRAVENOUS

## 2020-11-06 MED ORDER — ASPIRIN 81 MG PO CHEW
81.0000 mg | CHEWABLE_TABLET | Freq: Every day | ORAL | Status: DC
  Administered 2020-11-06 – 2020-11-07 (×2): 81 mg via ORAL
  Filled 2020-11-06: qty 1

## 2020-11-06 MED ORDER — LABETALOL HCL 5 MG/ML IV SOLN
INTRAVENOUS | Status: AC
  Filled 2020-11-06: qty 4

## 2020-11-06 MED ORDER — HEPARIN (PORCINE) IN NACL 1000-0.9 UT/500ML-% IV SOLN
INTRAVENOUS | Status: DC | PRN
  Administered 2020-11-06: 500 mL

## 2020-11-06 SURGICAL SUPPLY — 17 items
CATH MITRA STEERABLE GUIDE (CATHETERS) ×1 IMPLANT
CLIP MITRA G4 DELIVERY SYS XTW (Clip) ×1 IMPLANT
CLOSURE PERCLOSE PROSTYLE (VASCULAR PRODUCTS) ×2 IMPLANT
GUIDEWIRE SAFE TJ AMPLATZ EXST (WIRE) ×1 IMPLANT
KIT DILATOR VASC 18G NDL (KITS) ×1 IMPLANT
KIT HEART LEFT (KITS) ×4 IMPLANT
KIT MICROPUNCTURE NIT STIFF (SHEATH) ×1 IMPLANT
KIT VERSACROSS LRG ACCESS (CATHETERS) ×1 IMPLANT
PACK CARDIAC CATHETERIZATION (CUSTOM PROCEDURE TRAY) ×2 IMPLANT
SHEATH PINNACLE 8F 10CM (SHEATH) ×1 IMPLANT
SHEATH PROBE COVER 6X72 (BAG) ×2 IMPLANT
STOPCOCK MORSE 400PSI 3WAY (MISCELLANEOUS) ×12 IMPLANT
SYSTEM MITRACLIP G4 (SYSTAGENIX WOUND MANAGEMENT) ×1 IMPLANT
TRANSDUCER W/STOPCOCK (MISCELLANEOUS) ×2 IMPLANT
TUBING ART PRESS 72  MALE/FEM (TUBING) ×2
TUBING ART PRESS 72 MALE/FEM (TUBING) ×1 IMPLANT
WIRE EMERALD 3MM-J .035X150CM (WIRE) ×1 IMPLANT

## 2020-11-06 NOTE — Anesthesia Procedure Notes (Signed)
Arterial Line Insertion Start/End1/13/2022 7:15 AM, 11/06/2020 7:25 AM Performed by: Moshe Salisbury, CRNA, CRNA  Patient location: Pre-op. Preanesthetic checklist: patient identified, IV checked, site marked, risks and benefits discussed, surgical consent, monitors and equipment checked, pre-op evaluation, timeout performed and anesthesia consent Lidocaine 1% used for infiltration Catheter size: 20 G Hand hygiene performed , maximum sterile barriers used  and Seldinger technique used  Attempts: 1 Procedure performed without using ultrasound guided technique. Following insertion, dressing applied and Biopatch. Post procedure assessment: normal  Patient tolerated the procedure well with no immediate complications.

## 2020-11-06 NOTE — Discharge Summary (Addendum)
[place] VALVE TEAM  Discharge Summary    Patient ID: [name] MRN: [id number]; DOB: 11-Aug-1935  Admit date: 11/06/2020 Discharge date: 11/07/2020  Primary Care Provider: Tammi Sou, MD  Primary Cardiologist:  Dr. Tamala Julian   Discharge Diagnoses    Principal Problem:   S/P mitral valve clip implantation Active Problems:   Gout   Essential hypertension   Cystitis   Obesity   History of CVA (cerebrovascular accident)   Non-rheumatic mitral regurgitation   Severe pulmonary hypertension (HCC)   Hypertension   Allergies Allergies  Allergen Reactions  . Barbiturates Nausea And Vomiting  . Demerol Other (See Comments)    sick  . Hydrochlorothiazide Other (See Comments)    unknown  . Irbesartan     HA  . Losartan Potassium     hair loss  . Olmesartan Medoxomil     hair loss  . Procaine Other (See Comments)    unknown  . Telmisartan     cramps    Diagnostic Studies/Procedures    TEER 11/06/20 MITRAL VALVE REPAIR  Conclusion  Successful transcatheter edge-to-edge repair of the mitral valve with a MitraClip XTW device, placed A2/P2, reducing MR from 4+ at baseline to 1-2+ post-procedure  Recommendations  Antiplatelet/Anticoag Recommend uninterrupted dual antiplatelet therapy with Aspirin 81mg  daily and Clopidogrel 75mg  daily. 3 months DAPT with ASA and plavix    _____________  Echo 11/07/2020: complete but pending formal read at the time of discharge   History of Present Illness     Erin [name] is a 86 y.o. female with a history of HTN, OA, chronic diastolic CHF, mitral valve prolapse with severe MR who presented to [facility] on 11/06/20 for planned TEER.  Patient has longstanding history of hypertension but is otherwise remained reasonably healthy and functionally independent until recently.  Patient states that last spring she first began to experience worsening symptoms of exertional shortness of breath and  orthopnea.  She was started on an oral diuretic by her primary care physician and noted some degree of symptomatic improvement.  However, over the following 6 months she experienced progressive symptoms of exertional shortness of breath. Transthoracic echocardiogram was performed July 03, 2020 revealing normal left ventricular systolic function with ejection fraction estimated 60 to 65%.  There was mitral valve prolapse with what appeared to be partially flail segment of the middle scallop of the posterior leaflet causing severe mitral regurgitation.  She was referred for formal cardiology evaluation and initially seen by Dr. Tamala Julian 08/27/2020. TEE and diagnostic cardiac catheterization were performed September 16, 2020.  TEE confirmed the presence of myxomatous degenerative disease with partially flail segment involving the middle scallop (P2) of the posterior leaflet and severe mitral regurgitation. Left ventricular systolic function was reported to be normal with ejection fraction estimated 60 to 65%.  Right ventricular size and systolic function was reportedly normal.  There was felt to be moderate to severe tricuspid regurgitation.  Left and right heart catheterization was notable for the presence of normal coronary artery anatomy with no significant coronary artery disease.  There was mild pulmonary hypertension with large V waves on wedge tracing consistent with severe mitral regurgitation.  Patient was referred to the multidisciplinary heart valve clinic and has been evaluated previously by Dr. Burt Knack.  The patient has been evaluated by the multidisciplinary valve team and felt to have severe, symptomatic mitral regurigation and to be a suitable candidate for TEER, which was set up for 11/06/20.  Hospital Course     Consultants: none  Mitral valve prolapse with severe MR: s/p successful transcatheter edge-to-edge repair of the mitral valve with a MitraClip XTW device, placed A2/P2, reducing MR from  4+ at baseline to 1-2+ post-procedure. Post op echo completed but pending formal read at discharge. She was started on aspirin and plavix, which will be continued x 6 months followed by aspirin alone, indefinitely. Plan for discharge home today with close follow up in the office next week.   _____________  Discharge Vitals Blood pressure 138/65, pulse 66, temperature 98.6 F (37 C), temperature source Oral, resp. rate 17, height 5\' 3"  (1.6 m), weight 56.2 kg, SpO2 94 %.  Filed Weights   11/06/20 0602  Weight: 56.2 kg    Labs & Radiologic Studies    CBC Recent Labs    11/04/20 1420 11/07/20 0208  WBC 9.8 10.4  HGB 13.7 12.4  HCT 41.2 39.4  MCV 90.5 91.4  PLT 220 194   Basic Metabolic Panel Recent Labs    11/04/20 1420 11/07/20 0208  NA 139 141  K 4.4 4.3  CL 105 106  CO2 23 26  GLUCOSE 102* 114*  BUN 21 16  CREATININE 0.63 0.74  CALCIUM 9.5 8.7*   Liver Function Tests Recent Labs    11/04/20 1420  AST 17  ALT 18  ALKPHOS 66  BILITOT 1.0  PROT 6.6  ALBUMIN 3.9   No results for input(s): LIPASE, AMYLASE in the last 72 hours. Cardiac Enzymes No results for input(s): CKTOTAL, CKMB, CKMBINDEX, TROPONINI in the last 72 hours. BNP Invalid input(s): POCBNP D-Dimer No results for input(s): DDIMER in the last 72 hours. Hemoglobin A1C No results for input(s): HGBA1C in the last 72 hours. Fasting Lipid Panel No results for input(s): CHOL, HDL, LDLCALC, TRIG, CHOLHDL, LDLDIRECT in the last 72 hours. Thyroid Function Tests No results for input(s): TSH, T4TOTAL, T3FREE, THYROIDAB in the last 72 hours.  Invalid input(s): FREET3 _____________  DG Chest 2 View  Result Date: 11/04/2020 CLINICAL DATA:  Mitral insufficiency EXAM: CHEST - 2 VIEW COMPARISON:  01/30/2020 FINDINGS: Moderate cardiomegaly is unchanged. No focal airspace disease or pulmonary edema. No pleural effusion or pneumothorax. IMPRESSION: Unchanged moderate cardiomegaly without pulmonary edema.  Electronically Signed   By: Ulyses Jarred M.D.   On: 11/04/2020 21:24   CARDIAC CATHETERIZATION  Result Date: 11/06/2020 Successful transcatheter edge-to-edge repair of the mitral valve with a MitraClip XTW device, placed A2/P2, reducing MR from 4+ at baseline to 1-2+ post-procedure  ECHO TEE  Result Date: 11/06/2020    TRANSESOPHOGEAL ECHO REPORT   Patient Name:   Erin [name] [facility] Date of Exam: 11/06/2020 Medical Rec #:  [id number]       Height:       63.0 in Accession #:    [id number]      Weight:       124.0 lb Date of Birth:  01/06/1935        BSA:          1.578 m Patient Age:    18 years        BP:           146/67 mmHg Patient Gender: F               HR:           57 bpm. Exam Location:  Inpatient Procedure: Transesophageal Echo, Color Doppler, Cardiac Doppler and 3D Echo Indications:     I34.0 Nonrheumatic mitral (  valve) insufficiency  History:         Patient has prior history of Echocardiogram examinations, most                  recent 09/16/2020. Pulmonary HTN; Risk Factors:Hypertension.  Sonographer:     Raquel Sarna Senior RDCS Referring Phys:  [place] Diagnosing Phys: Ena Dawley MD  Sonographer Comments: MitraClip Procedure with 1 clip PROCEDURE: The transesophogeal probe was passed without difficulty through the esophogus of the patient. Sedation performed by different physician. The patient's vital signs; including heart rate, blood pressure, and oxygen saturation; remained stable throughout the procedure. The patient developed no complications during the procedure. IMPRESSIONS  1. MitraClip Procedure: Baseline mitral valve was non-rheumatic, with flail P2 and ruptured chordae, severe 4+ mitral regurgitation with eccentric anteriorly directed jet with significant flow reversal in bilateral pulmonary veins. MR radius: 1.1 cm, MR  regurgitatant volume 110 ml, ERO: 0.65 cm2. MVA 5.2 cm2. A XTW MitraClip was successfully placed at A2-P2 location with improvement of mitral regurgitation from  4+ to 1-2+ with centrally located jet. Post clip placement pulmonary vein flow reversal has resolved and there was forward flow in both left and right pulmonary veins. Mean transmitral gradient increased from 1 to 5 mmHg. There was no effusion prior or post procedure. Post sheath removal there was only left to right flow across iatrogenic ASD.  2. Left ventricular ejection fraction, by estimation, is 55 to 60%. The left ventricle has normal function. The left ventricle has no regional wall motion abnormalities. The left ventricular internal cavity size was mildly dilated. Left ventricular diastolic function could not be evaluated.  3. Right ventricular systolic function is normal. The right ventricular size is normal.  4. Left atrial size was severely dilated. No left atrial/left atrial appendage thrombus was detected.  5. Right atrial size was moderately dilated.  6. The mitral valve is degenerative. Severe mitral valve regurgitation. No evidence of mitral stenosis. The mean mitral valve gradient is 1.0 mmHg.  7. Tricuspid valve regurgitation is moderate.  8. The aortic valve is normal in structure. Aortic valve regurgitation is trivial. No aortic stenosis is present.  9. There is mild (Grade II) plaque. 10. The inferior vena cava is normal in size with greater than 50% respiratory variability, suggesting right atrial pressure of 3 mmHg. Conclusion(s)/Recommendation(s): Normal biventricular function without evidence of hemodynamically significant valvular heart disease. FINDINGS  Left Ventricle: Left ventricular ejection fraction, by estimation, is 55 to 60%. The left ventricle has normal function. The left ventricle has no regional wall motion abnormalities. The left ventricular internal cavity size was mildly dilated. There is  no left ventricular hypertrophy. Left ventricular diastolic function could not be evaluated. Right Ventricle: The right ventricular size is normal. No increase in right ventricular wall  thickness. Right ventricular systolic function is normal. Left Atrium: Left atrial size was severely dilated. No left atrial/left atrial appendage thrombus was detected. Right Atrium: Right atrial size was moderately dilated. Pericardium: Trivial pericardial effusion is present. Mitral Valve: The mitral valve is degenerative in appearance. Severe mitral valve regurgitation. No evidence of mitral valve stenosis. MV peak gradient, 7.5 mmHg. The mean mitral valve gradient is 1.0 mmHg with average heart rate of 53 bpm. Tricuspid Valve: The tricuspid valve is normal in structure. Tricuspid valve regurgitation is moderate . No evidence of tricuspid stenosis. Aortic Valve: The aortic valve is normal in structure. Aortic valve regurgitation is trivial. No aortic stenosis is present. Pulmonic Valve: The pulmonic valve  was normal in structure. Pulmonic valve regurgitation is not visualized. No evidence of pulmonic stenosis. Aorta: The aortic root is normal in size and structure. There is mild (Grade II) plaque. Venous: The inferior vena cava is normal in size with greater than 50% respiratory variability, suggesting right atrial pressure of 3 mmHg. IAS/Shunts: No atrial level shunt detected by color flow Doppler.  MITRAL VALVE MV Peak grad: 7.5 mmHg MV Mean grad: 1.0 mmHg MV Vmax:      1.37 m/s MV Vmean:     80.6 cm/s MR Peak grad:    75.3 mmHg MR Mean grad:    52.0 mmHg MR Vmax:         434.00 cm/s MR Vmean:        341.0 cm/s MR PISA:         5.67 cm MR PISA Eff ROA: 48 mm MR PISA Radius:  0.95 cm Ena Dawley MD Electronically signed by Ena Dawley MD Signature Date/Time: 11/06/2020/11:14:32 AM    Final    Disposition   Pt is being discharged home today in good condition.  Follow-up Plans & Appointments     Follow-up Information    Eileen Stanford, PA-C. Go on 11/13/2020.   Specialties: Cardiology, Radiology Why: @3 :30 pm, please arrive at least 10 minuters early Contact information: [place] [address] [phone]              Discharge Instructions    Amb Referral to Cardiac Rehabilitation   Complete by: As directed    Diagnosis: Valve Repair   Valve: Mitral Comment - mitra clip   After initial evaluation and assessments completed: Virtual Based Care may be provided alone or in conjunction with Phase 2 Cardiac Rehab based on patient barriers.: Yes      Discharge Medications   Allergies as of 11/07/2020      Reactions   Barbiturates Nausea And Vomiting   Demerol Other (See Comments)   sick   Hydrochlorothiazide Other (See Comments)   unknown   Irbesartan    HA   Losartan Potassium    hair loss   Olmesartan Medoxomil    hair loss   Procaine Other (See Comments)   unknown   Telmisartan    cramps      Medication List    STOP taking these medications   ciprofloxacin 500 MG tablet Commonly known as: Cipro     TAKE these medications   aspirin 81 MG chewable tablet Chew 1 tablet (81 mg total) by mouth daily.   carvedilol 25 MG tablet Commonly known as: COREG Take 1 tablet (25 mg total) by mouth daily.   clopidogrel 75 MG tablet Commonly known as: PLAVIX Take 1 tablet (75 mg total) by mouth daily with breakfast.   diazepam 2 MG tablet Commonly known as: VALIUM Take 2 mg by mouth daily as needed for anxiety.   furosemide 20 MG tablet Commonly known as: LASIX Take 1 tablet (20 mg total) by mouth every Monday, Wednesday, and Friday.   loratadine 10 MG tablet Commonly known as: CLARITIN TAKE ONE TABLET BY MOUTH EVERY DAY AS NEEDED FOR ALLERGY   melatonin 5 MG Tabs Take 5 mg by mouth at bedtime.   multivitamin with minerals Tabs tablet Take 1 tablet by mouth daily.   nitroGLYCERIN 0.2 mg/hr patch Commonly known as: NITRODUR - Dosed in mg/24 hr 1/2 patch over L Achilles tendon bid   potassium chloride 10 MEQ tablet Commonly known  as: KLOR-CON Take 10 mEq by mouth daily as needed (when taking lasix).    PRESERVISION AREDS PO Take 1 tablet by mouth in the morning and at bedtime.   tretinoin 0.1 % cream Commonly known as: RETIN-A Apply topically at bedtime.   VITAMIN C PO Take 1 tablet by mouth daily.   VITAMIN D PO Take 3 capsules by mouth 2 (two) times daily.   VITAMIN E PO Take 1 capsule by mouth daily.   ZINC PO Take 1 tablet by mouth daily.         Outstanding Labs/Studies   none  Duration of Discharge Encounter   Greater than 30 minutes including physician time.  SignedAngelena Form, PA-C 11/07/2020, 10:25 AM [phone]  Patient seen, examined. Available data reviewed. Agree with findings, assessment, and plan as outlined by Nell Range, PA-C.  The patient is independently interviewed and examined.  She is an alert, oriented, elderly woman in no distress.  Lung fields are clear, heart is regular rate and rhythm with a 2/6 holosystolic murmur at the apex, abdomen is soft and nontender, right groin site is clear with no ecchymosis or hematoma.  I personally reviewed the patient's echo images which demonstrate appropriate position of the MitraClip device with 1-2+ residual eccentric MR.  There is marked improvement in her degree of mitral regurgitation.  She understands the need to take dual antiplatelet therapy for 3 months, then to continue aspirin 81 mg indefinitely.  We discussed SBE prophylaxis and will call in a prescription as needed.  Her follow-up is arranged as above.  Postprocedural restrictions reviewed with the patient and her husband who is at the bedside today.  Sherren Mocha, M.D. 11/07/2020 11:38 AM

## 2020-11-06 NOTE — Discharge Instructions (Signed)
Home Care Following Your MitraClip Procedure      If you have any questions or concerns you can call the structural heart office at [phone] during normal business hours 8am-4pm. If you have an urgent need after hours or on the weekend, please call [phone] to talk to the on call provider for general cardiology. If you have an emergency that requires immediate attention, please call 911.   Groin Site Care Refer to this sheet in the next few weeks. These instructions provide you with information on caring for yourself after your procedure. Your caregiver may also give you more specific instructions. Your treatment has been planned according to current medical practices, but problems sometimes occur. Call your caregiver if you have any problems or questions after your procedure. HOME CARE INSTRUCTIONS  You may shower 24 hours after the procedure. Remove the bandage (dressing) and gently wash the site with plain soap and water. Gently pat the site dry.   Do not apply powder or lotion to the site.   Do not sit in a bathtub, swimming pool, or whirlpool for 5 to 7 days.   No bending, squatting, or lifting anything over 10 pounds (4.5 kg) as directed by your caregiver.   Inspect the site at least twice daily.   Do not drive home if you are discharged the same day of the procedure. Have someone else drive you.   You may drive 72 hours after the procedure unless otherwise instructed by your caregiver.  What to expect:  Any bruising will usually fade within 1 to 2 weeks.   Blood that collects in the tissue (hematoma) may be painful to the touch. It should usually decrease in size and tenderness within 1 to 2 weeks.  SEEK IMMEDIATE MEDICAL CARE IF:  You have unusual pain at the groin site or down the affected leg.   You have redness, warmth, swelling, or pain at the groin site.   You have drainage (other than a small amount of blood on the dressing).   You have chills.   You have a  fever or persistent symptoms for more than 72 hours.   You have a fever and your symptoms suddenly get worse.   Your leg becomes pale, cool, tingly, or numb.   You have bleeding from the site. Hold pressure on the site until it subsides.    After MitraClip Checklist  Check  Test Description   Follow up appointment in 1-2 weeks  Most of our patients will see our structural heart physician assistant, Katie Alvie Speltz, or your primary cardiologist within 1-2 weeks. Your incision site will be checked and you will be cleared to resume all normal activities if you are doing well.     1 month echo and follow up  You will have an echo to check on your heart valve clip and be seen back in the office by Katie Bronislaus Verdell PA-C.   Follow up with your primary cardiologist You will need to be seen by your primary cardiologist in the following 3-6 months after your 1 month appointment in the valve clinic. Often times your Plavix or Aspirin will be discontinued during this time, but this is decided on a case by case basis.    1 year echo and follow up You will have another echo to check on your heart valve after one year and be seen back in the office by Katie Airelle Everding. This your last structural heart visit.   Bacterial endocarditis prophylaxis  You will   have to take antibiotics for the rest of your life before all dental procedures (even dental cleanings) to protect your heart valve from potential infection. Antibiotics are also required before some surgeries. Please check with your cardiologist before scheduling any surgeries. Also, please make sure to tell us if you have a penicillin allergy as you will require an alternative antibiotic.    ______________  Your Implant Identification Card Following your procedure, you will receive an Implant Identification Card, which your doctor will fill out and which you must carry with you at all times. Show your Implant Identification Card if you report to an emergency room.  This card identifies you as a patient who has had a MitraClip device implanted. If you require a magnetic resonance imaging (MRI) scan, tell your doctor or MRI technician that you have a MitraClip device implanted. Test results indicate that patients with the MitraClip device can safely undergo MRI scans under certain conditions described on the card.  

## 2020-11-06 NOTE — Progress Notes (Signed)
LOCATION: left RADIAL  DEFLATED PER PROTOCOL: a-line removed  TIME BAND OFF/DRESSING APPLIED:  2x2 gauze with tegaderm placed  SITE UPON ARRIVAL: LEVEL 0  SITE AFTER BAND REMOVAL: LEVEL 0  CIRCULATION SENSATION AND MOVEMENT: Yes, pulse 2+_  COMMENTS: manual pressure held for approximately 10 minutes after radial sheath removed

## 2020-11-06 NOTE — Plan of Care (Signed)
Initiation of Care Plan Problem: Education: Goal: Knowledge of General Education information will improve Description: Including pain rating scale, medication(s)/side effects and non-pharmacologic comfort measures Outcome: Progressing   Problem: Health Behavior/Discharge Planning: Goal: Ability to manage health-related needs will improve Outcome: Progressing   Problem: Clinical Measurements: Goal: Ability to maintain clinical measurements within normal limits will improve Outcome: Progressing Goal: Will remain free from infection Outcome: Progressing Goal: Diagnostic test results will improve Outcome: Progressing Goal: Respiratory complications will improve Outcome: Progressing Goal: Cardiovascular complication will be avoided Outcome: Progressing   Problem: Activity: Goal: Risk for activity intolerance will decrease Outcome: Progressing   Problem: Nutrition: Goal: Adequate nutrition will be maintained Outcome: Progressing   Problem: Coping: Goal: Level of anxiety will decrease Outcome: Progressing   Problem: Elimination: Goal: Will not experience complications related to bowel motility Outcome: Progressing Goal: Will not experience complications related to urinary retention Outcome: Progressing   Problem: Pain Managment: Goal: General experience of comfort will improve Outcome: Progressing   Problem: Safety: Goal: Ability to remain free from injury will improve Outcome: Progressing   Problem: Skin Integrity: Goal: Risk for impaired skin integrity will decrease Outcome: Progressing   Problem: Cardiac: Goal: Ability to achieve and maintain adequate cardiopulmonary perfusion will improve Outcome: Progressing

## 2020-11-06 NOTE — Interval H&P Note (Signed)
History and Physical Interval Note:  11/06/2020 7:40 AM  Erin [name]  has presented today for surgery, with the diagnosis of Severe Mitral Insufficiency.  The various methods of treatment have been discussed with the patient and family. After consideration of risks, benefits and other options for treatment, the patient has consented to  Procedure(s): MITRAL VALVE REPAIR (N/A) TRANSESOPHAGEAL ECHOCARDIOGRAM (TEE) (N/A) as a surgical intervention.  The patient's history has been reviewed, patient examined, no change in status, stable for surgery.  I have reviewed the patient's chart and labs.  Questions were answered to the patient's satisfaction.     Sherren Mocha

## 2020-11-06 NOTE — Anesthesia Postprocedure Evaluation (Signed)
Anesthesia Post Note  Patient: Erin [name]  Procedure(s) Performed: MITRAL VALVE REPAIR (N/A ) TRANSESOPHAGEAL ECHOCARDIOGRAM (TEE) (N/A )     Patient location during evaluation: PACU Anesthesia Type: General Level of consciousness: awake and alert Pain management: pain level controlled Vital Signs Assessment: post-procedure vital signs reviewed and stable Respiratory status: spontaneous breathing, nonlabored ventilation, respiratory function stable and patient connected to nasal cannula oxygen Cardiovascular status: blood pressure returned to baseline and stable Postop Assessment: no apparent nausea or vomiting Anesthetic complications: no   No complications documented.  Last Vitals:  Vitals:   11/06/20 1230 11/06/20 1245  BP:    Pulse: 69 68  Resp: 16 19  Temp:    SpO2: 98% 97%    Last Pain:  Vitals:   11/06/20 1203  TempSrc: Oral  PainSc: 0-No pain                 Effie Berkshire

## 2020-11-06 NOTE — Anesthesia Procedure Notes (Signed)
Procedure Name: Intubation Date/Time: 11/06/2020 8:01 AM Performed by: Moshe Salisbury, CRNA Pre-anesthesia Checklist: Patient identified, Emergency Drugs available, Suction available and Patient being monitored Patient Re-evaluated:Patient Re-evaluated prior to induction Oxygen Delivery Method: Circle System Utilized Preoxygenation: Pre-oxygenation with 100% oxygen Induction Type: IV induction Ventilation: Mask ventilation without difficulty Laryngoscope Size: Mac and 3 Grade View: Grade III Tube type: Oral Tube size: 7.0 mm Number of attempts: 1 Airway Equipment and Method: Stylet Placement Confirmation: ETT inserted through vocal cords under direct vision,  positive ETCO2 and breath sounds checked- equal and bilateral Secured at: 20 cm Tube secured with: Tape Dental Injury: Teeth and Oropharynx as per pre-operative assessment

## 2020-11-06 NOTE — Progress Notes (Signed)
Echocardiogram Echocardiogram Transesophageal has been performed.  Oneal Deputy Maksymilian Mabey 11/06/2020, 10:47 AM

## 2020-11-06 NOTE — Transfer of Care (Signed)
Immediate Anesthesia Transfer of Care Note  Patient: Erin [name]  Procedure(s) Performed: MITRAL VALVE REPAIR (N/A ) TRANSESOPHAGEAL ECHOCARDIOGRAM (TEE) (N/A )  Patient Location: Cath Lab  Anesthesia Type:General  Level of Consciousness: drowsy and patient cooperative  Airway & Oxygen Therapy: Patient Spontanous Breathing and Patient connected to nasal cannula oxygen  Post-op Assessment: Report given to RN, Post -op Vital signs reviewed and stable and Patient moving all extremities  Post vital signs: Reviewed and stable  Last Vitals:  Vitals Value Taken Time  BP 173/68 11/06/20 1034  Temp    Pulse 70 11/06/20 1036  Resp 17 11/06/20 1036  SpO2 96 % 11/06/20 1036  Vitals shown include unvalidated device data.  Last Pain:  Vitals:   11/06/20 0611  TempSrc:   PainSc: 0-No pain         Complications: No complications documented.

## 2020-11-07 ENCOUNTER — Telehealth: Payer: Self-pay | Admitting: Cardiovascular Disease

## 2020-11-07 ENCOUNTER — Encounter (HOSPITAL_COMMUNITY): Payer: Self-pay | Admitting: Cardiovascular Disease

## 2020-11-07 ENCOUNTER — Inpatient Hospital Stay (HOSPITAL_COMMUNITY): Payer: Medicare Other

## 2020-11-07 DIAGNOSIS — Z006 Encounter for examination for normal comparison and control in clinical research program: Secondary | ICD-10-CM | POA: Diagnosis not present

## 2020-11-07 DIAGNOSIS — Z9889 Other specified postprocedural states: Secondary | ICD-10-CM

## 2020-11-07 DIAGNOSIS — I511 Rupture of chordae tendineae, not elsewhere classified: Secondary | ICD-10-CM | POA: Diagnosis not present

## 2020-11-07 DIAGNOSIS — I340 Nonrheumatic mitral (valve) insufficiency: Secondary | ICD-10-CM

## 2020-11-07 DIAGNOSIS — Z954 Presence of other heart-valve replacement: Secondary | ICD-10-CM

## 2020-11-07 DIAGNOSIS — I5032 Chronic diastolic (congestive) heart failure: Secondary | ICD-10-CM | POA: Diagnosis not present

## 2020-11-07 DIAGNOSIS — Z95818 Presence of other cardiac implants and grafts: Secondary | ICD-10-CM

## 2020-11-07 LAB — BASIC METABOLIC PANEL
Anion gap: 9 (ref 5–15)
BUN: 16 mg/dL (ref 8–23)
CO2: 26 mmol/L (ref 22–32)
Calcium: 8.7 mg/dL — ABNORMAL LOW (ref 8.9–10.3)
Chloride: 106 mmol/L (ref 98–111)
Creatinine, Ser: 0.74 mg/dL (ref 0.44–1.00)
GFR, Estimated: >60 mL/min (ref >60)
Glucose, Bld: 114 mg/dL — ABNORMAL HIGH (ref 70–99)
Potassium: 4.3 mmol/L (ref 3.5–5.1)
Sodium: 141 mmol/L (ref 135–145)

## 2020-11-07 LAB — ECHOCARDIOGRAM COMPLETE
Area-P 1/2: 1.61 cm2
Height: 63 in
MV M vel: 5.3 m/s
MV Peak grad: 112.4 mmHg
MV VTI: 1.22 cm2
S' Lateral: 3.2 cm
Weight: 1984 oz

## 2020-11-07 LAB — CBC
HCT: 39.4 % (ref 36.0–46.0)
Hemoglobin: 12.4 g/dL (ref 12.0–15.0)
MCH: 28.8 pg (ref 26.0–34.0)
MCHC: 31.5 g/dL (ref 30.0–36.0)
MCV: 91.4 fL (ref 80.0–100.0)
Platelets: 201 10*3/uL (ref 150–400)
RBC: 4.31 MIL/uL (ref 3.87–5.11)
RDW: 14.9 % (ref 11.5–15.5)
WBC: 10.4 10*3/uL (ref 4.0–10.5)
nRBC: 0 % (ref 0.0–0.2)

## 2020-11-07 LAB — POCT ACTIVATED CLOTTING TIME
Activated Clotting Time: 273 seconds
Activated Clotting Time: 297 seconds

## 2020-11-07 MED ORDER — ASPIRIN 81 MG PO CHEW: 81.0000 mg | CHEWABLE_TABLET | Freq: Every day | ORAL | Status: DC

## 2020-11-07 MED ORDER — CLOPIDOGREL BISULFATE 75 MG PO TABS: 75.0000 mg | ORAL_TABLET | Freq: Every day | ORAL | 1 refills | Status: DC

## 2020-11-07 NOTE — Telephone Encounter (Signed)
Confirmed with patient only her uvula is swollen and not her throat. Encouraged her to keep ice in her mouth and take some Tylenol if she can.  She will call back if symptoms progress or 911 if her throat starts to swell. She was grateful for call and agrees with plan.

## 2020-11-07 NOTE — Progress Notes (Signed)
Pt discharged home with husband. Reviewed AVS with both husband and Pt.  Pt taken to front of hospital to meet her husband.

## 2020-11-07 NOTE — Plan of Care (Signed)

## 2020-11-07 NOTE — Telephone Encounter (Signed)
Patient calling to speak with [facility]. She states her uvula is red and swollen. She states she just got out of hospital and it did not start until she left to go home. She states she also recently had a TEE.

## 2020-11-07 NOTE — Plan of Care (Signed)

## 2020-11-07 NOTE — Telephone Encounter (Signed)
Likely from TEE probe. Just supportive care with tyelnol and close monitoring recommended

## 2020-11-07 NOTE — Progress Notes (Signed)
  Echocardiogram 2D Echocardiogram has been performed.  Erin [name] 11/07/2020, 11:17 AM

## 2020-11-07 NOTE — Progress Notes (Signed)
CARDIAC REHAB PHASE I   PRE:  Rate/Rhythm: 65 SR  BP:  Supine: 160/65  Sitting:  Standing:    SaO2: 94%RA  MODE:  Ambulation: 240 ft   POST:  Rate/Rhythm: 94 SR  BP:  Supine: 190/84, 169/87  Sitting:   Standing:    SaO2: 96%RA 0840-0930 Pt walked 240 ft on RA with hand held asst. Tolerated well. No DOE noted. Pt encouraged to watch sodium and heart healthy diet given. Discussed CRP 2 [place] and pt will consider. Her husband stated he did program before.Graylon Good, RN BSN  11/07/2020 9:26 AM

## 2020-11-10 ENCOUNTER — Telehealth: Payer: Self-pay

## 2020-11-10 NOTE — Telephone Encounter (Signed)
Patient contacted regarding discharge from [facility] on 11/07/2020.  Patient understands to follow up with provider Nell Range PA-C on 11/13/2020 at 3:30 PM at [facility] location. Patient understands discharge instructions? yes Patient understands medications and regiment? yes Patient understands to bring all medications to this visit? yes

## 2020-11-13 ENCOUNTER — Ambulatory Visit: Payer: Medicare Other | Admitting: Physician Assistant

## 2020-11-13 ENCOUNTER — Telehealth: Payer: Self-pay | Admitting: *Deleted

## 2020-11-13 NOTE — Telephone Encounter (Signed)
Called patient to move up her appointment time today. She wants to cancel altogether today because her husband is sick and she may be taking him to the hospital or PCP.  The patient is feeling well.  I offered her virtual but she prefers to come into the office.  Cancelled TOC visit for today.

## 2020-11-13 NOTE — Telephone Encounter (Signed)
I spoke to the pt. She is feeling quite well with improved SOB and groin site healing well s/p Clip last week. Her husband is sick with a sore throat and cold like symptoms. They are both unvaccinated for covid 19. They will plan to get him tested and call his PCP. I will see her in the office on 2/9 for echo and follow  up. If she needs to be seen sooner she will call. I answered all her post operative questions today.

## 2020-11-17 ENCOUNTER — Other Ambulatory Visit: Payer: Self-pay

## 2020-11-17 ENCOUNTER — Ambulatory Visit: Payer: Medicare Other | Admitting: Cardiovascular Disease

## 2020-11-17 DIAGNOSIS — I10 Essential (primary) hypertension: Secondary | ICD-10-CM

## 2020-11-17 DIAGNOSIS — Z20822 Contact with and (suspected) exposure to covid-19: Secondary | ICD-10-CM

## 2020-11-18 ENCOUNTER — Telehealth (HOSPITAL_COMMUNITY): Payer: Self-pay

## 2020-11-18 ENCOUNTER — Telehealth: Payer: Self-pay

## 2020-11-18 NOTE — Telephone Encounter (Signed)
Called to discuss with patient about COVID-19 symptoms and the use of one of the available treatments for those with mild to moderate Covid symptoms and at a high risk of hospitalization.  Pt appears to qualify for outpatient treatment due to co-morbid conditions and/or a member of an at-risk group in accordance with the FDA Emergency Use Authorization.    Symptom onset: 11/16/20 Vaccinated: No Booster? No Immunocompromised? No Qualifiers: Heart disease  Unable to reach pt - Reached pt.  Pt. States she has not had a COVID 19 test, "but everyone around me has COVID. I don't think I need any further treatment." Instructed to contact her PCP as needed. Marcello Moores

## 2020-11-18 NOTE — Telephone Encounter (Signed)
Pt insurance is active and benefits verified through Medicare a/b Co-pay 0, DED $233/$11.09 met, out of pocket 0/0 met, co-insurance 20%. no pre-authorization required. Passport, 11/17/2020@12 :22pm, REF# [id number]  2ndary insurance is active and benefits verified through [facility]. Co-pay 0, DED 0/0 met, out of pocket 0/0 met, co-insurance 0%. No pre-authorization required.

## 2020-12-02 NOTE — Progress Notes (Signed)
HEART [facility] [employer]                                     Cardiology Office Note:    Date:  12/04/2020   ID:  Erin [name], DOB [birth year], MRN [id number]  PCP:  Tammi Sou, MD  [facility] Cardiologist:  Dr. Rinaldo Cloud [facility] Electrophysiologist:  None   Referring MD: Tammi Sou, MD   1 month s/p MitraClip  History of Present Illness:    Erin [name] is a 86 y.o. female with a hx of HTN, OA, chronic diastolic CHF, mitral valve prolapse with severe MR s/p MitraClip (11/06/20) who presents to clinic for follow up.   Patient has longstanding history of hypertension but is otherwise remained reasonably healthy and functionally independent until recently. Patient states that last spring she first began to experience worsening symptoms of exertional shortness of breath and orthopnea. She was started on an oral diuretic by her primary care physician and noted some degree of symptomatic improvement. However, over the following 6 months she experienced progressive symptoms of exertional shortness of breath. Transthoracic echocardiogram was performed July 03, 2020 revealing normal left ventricular systolic function with ejection fraction estimated 60 to 65%. There was mitral valve prolapse with what appeared to be partially flail segment of the middle scallop of the posterior leaflet causing severe mitral regurgitation. She was referred for formal cardiology evaluation and initially seen by Dr. Tamala Julian 08/27/2020. TEE and diagnostic cardiac catheterization were performed September 16, 2020. TEE confirmed the presence of myxomatous degenerative disease with partially flail segment involving the middle scallop (P2) of the posterior leaflet and severe mitral regurgitation.Left ventricular systolic function was reported to be normal with ejection fraction estimated 60 to 65%. Right ventricular size and systolic function was  reportedly normal. There was felt to be moderate to severe tricuspid regurgitation. Left and right heart catheterization was notable for the presence of normal coronary artery anatomy with no significant coronary artery disease. There was mild pulmonary hypertension with large V waves on wedge tracing consistent with severe mitral regurgitation. Patient was referred to the multidisciplinary heart valve clinic and has been evaluated previously by Dr. Burt Knack.  She was evaluated by the multidisciplinary valve team and underwent successful transcatheter edge-to-edge repair of the mitral valve with a MitraClip XTW device, placed A2/P2, reducing MR from 4+ at baseline to 1-2+ post-procedure. Post op echo showed mild to moderate eccentric MR with mean gradient of 4 mm hg. She was started on aspirin and plavix, which will be continued x 3 months followed by aspirin alone, indefinitely.   Today she presents to clinic for follow up. Has been ill with a flu-like illness. Was not tested for flu or covid. No CP but has been having some SOB. No LE edema, orthopnea or PND. No dizziness or syncope. No blood in stool or urine. No palpitations.   Past Medical History:  Diagnosis Date  . Achilles tendon rupture    left, no surgery required  . Hypertension   . Hypertensive retinopathy of both eyes    Dr. Herbert Deaner  . Low back pain   . Lower extremity edema    lasix prn  . OA (osteoarthritis)   . Osteopenia   . S/P mitral valve clip implantation 11/06/2020   s/p TEER with one XTW MitraClip on A2P2 by Dr. Burt Knack.  DAPT w/ASA and plavix x 3 mo post procedure recommended  . Severe mitral regurgitation    2021 transthoracic echo and TEE->cardiology following.  Mitral valve clip 10/2020.  f/u echo 10/28/20 mild/mod MVR and tricusp regurg  . Severe pulmonary hypertension ([place]) 2021   transth echo; moderate by TEE 08/2020  . Systolic murmur 24/40/1027   severe mitral regurge, mod/severe tricuspic regurg    Past  Surgical History:  Procedure Laterality Date  . APPENDECTOMY     86 yrs old  . CHOLECYSTECTOMY    . LUMBAR LAMINECTOMY  2004  . MITRAL VALVE REPAIR N/A 11/06/2020   Procedure: MITRAL VALVE REPAIR;  Surgeon: Sherren Mocha, MD;  Location: [place] [facility];  Service: Cardiovascular;  Laterality: N/A;  . RIGHT/LEFT HEART CATH AND CORONARY ANGIOGRAPHY N/A 09/16/2020   No CAD. Procedure: RIGHT/LEFT HEART CATH AND CORONARY ANGIOGRAPHY;  Surgeon: Belva Crome, MD;  Location: [place] [facility];  Service: Cardiovascular;  Laterality: N/A;  . TEE WITHOUT CARDIOVERSION N/A 09/16/2020   Procedure: TRANSESOPHAGEAL ECHOCARDIOGRAM (TEE);  Surgeon: Buford Dresser, MD;  Location: [facility] ENDOSCOPY;  Service: Cardiovascular;  Laterality: N/A;  . TEE WITHOUT CARDIOVERSION N/A 11/06/2020   Procedure: TRANSESOPHAGEAL ECHOCARDIOGRAM (TEE);  Surgeon: Sherren Mocha, MD;  Location: [place] [facility];  Service: Cardiovascular;  Laterality: N/A;  . TONSILLECTOMY    . TRANSTHORACIC ECHOCARDIOGRAM  07/03/2020; 10/28/20   06/2020 TTE EF 60-65%, grd II DD, severe pulm art HTN, severe mitral valve regurg, mod/sev tricuspic valve regurg. Conf on TEE 08/2020.  10/28/20 (s/p MV clip procedure) mild-mod MVR, EF 55-60%    Current Medications: Current Meds  Medication Sig  . amoxicillin (AMOXIL) 500 MG tablet Take 4 tablets (2,000 mg) one hour prior to all dental visits.  . Ascorbic Acid (VITAMIN C PO) Take 1 tablet by mouth daily.  Marland Kitchen aspirin 81 MG chewable tablet Chew 1 tablet (81 mg total) by mouth daily.  . carvedilol (COREG) 25 MG tablet Take 1 tablet (25 mg total) by mouth daily.  . clopidogrel (PLAVIX) 75 MG tablet Take 1 tablet (75 mg total) by mouth daily with breakfast.  . diazepam (VALIUM) 2 MG tablet Take 2 mg by mouth daily as needed for anxiety.  . furosemide (LASIX) 20 MG tablet Take 1 tablet (20 mg total) by mouth every Monday, Wednesday, and Friday.  . loratadine (CLARITIN) 10 MG tablet TAKE ONE TABLET BY  MOUTH EVERY DAY AS NEEDED FOR ALLERGY  . melatonin 5 MG TABS Take 5 mg by mouth at bedtime.  . Multiple Vitamin (MULTIVITAMIN WITH MINERALS) TABS tablet Take 1 tablet by mouth daily.  . Multiple Vitamins-Minerals (PRESERVISION AREDS PO) Take 1 tablet by mouth in the morning and at bedtime.   . Multiple Vitamins-Minerals (ZINC PO) Take 1 tablet by mouth daily.  . nitroGLYCERIN (NITRODUR - DOSED IN MG/24 HR) 0.2 mg/hr patch 1/2 patch over L Achilles tendon bid  . potassium chloride (KLOR-CON) 10 MEQ tablet Take 10 mEq by mouth daily as needed (when taking lasix).  Marland Kitchen VITAMIN D PO Take 3 capsules by mouth 2 (two) times daily.  Marland Kitchen VITAMIN E PO Take 1 capsule by mouth daily.     Allergies:   Barbiturates, Demerol, Hydrochlorothiazide, Irbesartan, Losartan potassium, Olmesartan medoxomil, Procaine, and Telmisartan   Social History   Socioeconomic History  . Marital status: Married    Spouse name: Not on file  . Number of children: Not on file  . Years of education: Not on file  . Highest  education level: Not on file  Occupational History  . Not on file  Tobacco Use  . Smoking status: Never Smoker  . Smokeless tobacco: Never Used  Vaping Use  . Vaping Use: Never used  Substance and Sexual Activity  . Alcohol use: No  . Drug use: Never  . Sexual activity: Not Currently  Other Topics Concern  . Not on file  Social History Narrative   Married, 6 children, about 15 GC.  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   No tob.   No alc.   Social Determinants of Health   Financial Resource Strain: Not on file  Food Insecurity: Not on file  Transportation Needs: Not on file  Physical Activity: Not on file  Stress: Not on file  Social Connections: Not on file     Family History: The patient's family history includes Cancer in her father; Early death in her father; Hypertension in her mother and another family  member.  ROS:   Please see the history of present illness.    All other systems reviewed and are negative.  EKGs/Labs/Other Studies Reviewed:    The following studies were reviewed today:  TEER 11/06/20 MITRAL VALVE REPAIR  Conclusion Successful transcatheter edge-to-edge repair of the mitral valve with a MitraClip XTW device, placed A2/P2, reducing MR from 4+ at baseline to 1-2+ post-procedure  Recommendations  Antiplatelet/Anticoag Recommend uninterrupted dual antiplatelet therapy with Aspirin 81mg  daily and Clopidogrel 75mg  daily. 3 months DAPT with ASA and plavix    _____________   Echo 11/07/2020: IMPRESSIONS  1. S/P XTW MitraClip at A2-P2 location with mild to moderate eccentric  mitral regurgitation, without pulmonary vein reversal. The mitral valve  has been repaired/replaced. Mild to moderate mitral valve regurgitation.  The mean mitral valve gradient is 4.0  mmHg with average heart rate of 49 bpm.  2. Left ventricular ejection fraction, by estimation, is 55 to 60%. The  left ventricle has normal function. The left ventricle has no regional  wall motion abnormalities. Left ventricular diastolic parameters are  indeterminate.  3. Right ventricular systolic function is normal. The right ventricular  size is normal. There is mildly elevated pulmonary artery systolic  pressure.  4. Left atrial size was severely dilated.  5. Right atrial size was moderately dilated.  6. Tricuspid valve regurgitation is moderate.  7. The aortic valve is normal in structure. Aortic valve regurgitation is  trivial.  8. The inferior vena cava is normal in size with greater than 50%  respiratory variability, suggesting right atrial pressure of 3 mmHg.  9. Evidence of atrial level shunting detected by color flow Doppler.   Comparison(s): A prior study was performed on 11/06/20. No significant  change from prior study (procedural TEE).    _________________  Echo  12/03/2020 IMPRESSIONS  1. Left ventricular ejection fraction, by estimation, is 60 to 65%. The  left ventricle has normal function. The left ventricle has no regional  wall motion abnormalities. Left ventricular diastolic function could not  be evaluated.  2. Right ventricular systolic function is normal. The right ventricular  size is normal. There is moderately elevated pulmonary artery systolic  pressure. The estimated right ventricular systolic pressure is 41.9 mmHg.  3. Left atrial size was severely dilated.  4. Residual iatrogenic ASD from trans-septal puncture is seen. There is a  small secundum atrial septal defect with exclusively left to right  shunting across the atrial septum.  5. Residual mitral insufficiency originates primarily lateral to the  Mitraclip, which is located A2-P2. The jet is oriented anteriorly and  towards the septum. The mitral valve has been repaired/replaced. Mild to  moderate mitral valve regurgitation. Mild  mitral stenosis. The mean mitral valve gradient is 4.0 mmHg with average  heart rate of 66 bpm.  6. Tricuspid valve regurgitation is moderate to severe.  7. The aortic valve is tricuspid. Aortic valve regurgitation is trivial.  Mild aortic valve sclerosis is present, with no evidence of aortic valve  stenosis.  8. The inferior vena cava is normal in size with <50% respiratory  variability, suggesting right atrial pressure of 8 mmHg.  9. Right atrial size was mildly dilated.   Comparison(s): No significant change from prior study. Prior images  reviewed side by side.   EKG:  EKG is NOT ordered today.    Recent Labs: 01/30/2020: Pro B Natriuretic peptide (BNP) 475.0 07/02/2020: Magnesium 1.9; TSH 2.37 11/04/2020: ALT 18 11/07/2020: BUN 16; Creatinine, Ser 0.74; Hemoglobin 12.4; Platelets 201; Potassium 4.3; Sodium 141  Recent Lipid Panel    Component Value Date/Time   CHOL 139 07/02/2020 1057   TRIG 30.0 07/02/2020 1057   TRIG 33  09/21/2006 1115   HDL 80.30 07/02/2020 1057   CHOLHDL 2 07/02/2020 1057   VLDL 6.0 07/02/2020 1057   LDLCALC 53 07/02/2020 1057     Risk Assessment/Calculations:       Physical Exam:    VS:  BP 140/80   Pulse 62   Ht 5\' 4"  (1.626 m)   Wt 120 lb 9.6 oz (54.7 kg)   SpO2 98%   BMI 20.70 kg/m     Wt Readings from Last 3 Encounters:  12/03/20 120 lb 9.6 oz (54.7 kg)  11/06/20 124 lb (56.2 kg)  11/04/20 124 lb 4.8 oz (56.4 kg)     GEN: Well nourished, well developed in no acute distress HEENT: Normal NECK: No JVD; No carotid bruits LYMPHATICS: No lymphadenopathy CARDIAC: RRR, 2/6 holosystolic murmur at apex. No rubs, gallops RESPIRATORY:  Clear to auscultation without rales, wheezing or rhonchi  ABDOMEN: Soft, non-tender, non-distended MUSCULOSKELETAL:  No edema; No deformity  SKIN: Warm and dry NEUROLOGIC:  Alert and oriented x 3 PSYCHIATRIC:  Normal affect   ASSESSMENT:    1. S/P mitral valve clip implantation   2. Non-rheumatic mitral regurgitation    PLAN:    In order of problems listed above:  Mitral valve prolapse with severe MR s/p TEER: doing okay 1 month out from TEER. Echo today shows EF 60%, mild-mod residual MR with mean gradient of 59mm hg and moderate to severe TR. She has NYHA class II symptoms but also recovering from a flu like illness which could have easily been Covid (pt unvaccinated) so difficult to tease out clinical improvement.. She is currently on aspirin and plavix. Plavix will be discontinued after 3 months (01/2021). SBE prophylaxis discussed; I have RX'd amoxicillin. I will see her back in 1 year with an echo.     Medication Adjustments/Labs and Tests Ordered: Current medicines are reviewed at length with the patient today.  Concerns regarding medicines are outlined above.  Orders Placed This Encounter  Procedures  . ECHOCARDIOGRAM COMPLETE   Meds ordered this encounter  Medications  . amoxicillin (AMOXIL) 500 MG tablet    Sig: Take 4  tablets (2,000 mg) one hour prior to all dental visits.    Dispense:  8 tablet  Refill:  11    Patient Instructions  Medication Instructions:  1) You may STOP PLAVIX 02/04/2021 *If you need a refill on your cardiac medications before your next appointment, please call your pharmacy*   Follow-Up: You are scheduled with Dr. Tamala Julian on 5/10 at 2:40PM.  You are scheduled for your 1 year echo and office visit on 11/04/2021. Please arrive at 12:45PM.    Signed, [name], PA-C  12/04/2020 3:25 PM    [place]

## 2020-12-03 ENCOUNTER — Other Ambulatory Visit: Payer: Self-pay

## 2020-12-03 ENCOUNTER — Ambulatory Visit (INDEPENDENT_AMBULATORY_CARE_PROVIDER_SITE_OTHER): Payer: Medicare Other | Admitting: Physician Assistant

## 2020-12-03 ENCOUNTER — Ambulatory Visit (HOSPITAL_COMMUNITY): Payer: Medicare Other | Attending: Cardiovascular Disease

## 2020-12-03 ENCOUNTER — Encounter: Payer: Self-pay | Admitting: Physician Assistant

## 2020-12-03 VITALS — BP 140/80 | HR 62 | Ht 64.0 in | Wt 120.6 lb

## 2020-12-03 DIAGNOSIS — Z9889 Other specified postprocedural states: Secondary | ICD-10-CM

## 2020-12-03 DIAGNOSIS — Z95818 Presence of other cardiac implants and grafts: Secondary | ICD-10-CM | POA: Insufficient documentation

## 2020-12-03 DIAGNOSIS — Z954 Presence of other heart-valve replacement: Secondary | ICD-10-CM | POA: Diagnosis not present

## 2020-12-03 DIAGNOSIS — I340 Nonrheumatic mitral (valve) insufficiency: Secondary | ICD-10-CM | POA: Diagnosis not present

## 2020-12-03 LAB — ECHOCARDIOGRAM COMPLETE
Area-P 1/2: 1.67 cm2
MV M vel: 7.02 m/s
MV Peak grad: 197.1 mmHg
MV VTI: 1.02 cm2
P 1/2 time: 134 msec
S' Lateral: 3 cm

## 2020-12-03 NOTE — Patient Instructions (Addendum)
Medication Instructions:  1) You may STOP PLAVIX 02/04/2021 2) Your provider discussed the importance of taking an antibiotic prior to all dental visits to prevent damage to the heart valves from infection. You were given a prescription for AMOXIL 2,000mg  to take one hour prior to any dental appointment.  *If you need a refill on your cardiac medications before your next appointment, please call your pharmacy*   Follow-Up: You are scheduled with Dr. Tamala Julian on 5/10 at 2:40PM.  You are scheduled for your 1 year echo and office visit on 11/04/2021. Please arrive at 12:45PM.

## 2020-12-04 MED ORDER — AMOXICILLIN 500 MG PO TABS: ORAL_TABLET | ORAL | 11 refills | Status: DC

## 2020-12-10 NOTE — Telephone Encounter (Signed)
Called pt to see if she wanted to schedule for cardiac rehab, pt declined the cardiac rehab program. Closed referral.

## 2020-12-15 DIAGNOSIS — M25562 Pain in left knee: Secondary | ICD-10-CM | POA: Diagnosis not present

## 2020-12-18 ENCOUNTER — Telehealth: Payer: Self-pay | Admitting: Family Medicine

## 2020-12-18 ENCOUNTER — Telehealth: Payer: Self-pay | Admitting: *Deleted

## 2020-12-18 NOTE — Telephone Encounter (Signed)
Agree with Katie's recommendations

## 2020-12-18 NOTE — Telephone Encounter (Signed)
Erin [name] had cardiac catheterization 09/16/2020 that showed normal coronary arteries.  She underwent MitraClip procedure on 11/06/2020 and was discharged on aspirin and  Plavix.  Echocardiogram obtained on 12/03/2020 showed EF 60 to 65%, mild to moderate MR, moderate to severe TR.  Will need to check with our structural heart team to see how early after mitral clip procedure can the patient start holding aspirin and Plavix for other surgery.

## 2020-12-18 NOTE — Telephone Encounter (Signed)
   [facility] Pre-operative Risk Assessment    HEARTCARE STAFF: - Please ensure there is not already an duplicate clearance open for this procedure. - Under Visit Info/Reason for Call, type in Other and utilize the format Clearance MM/DD/YY or Clearance TBD. Do not use dashes or single digits. - If request is for dental extraction, please clarify the # of teeth to be extracted.  Request for surgical clearance:  1. What type of surgery is being performed? LEFT TOTAL KNEE REPLACEMENT    2. When is this surgery scheduled? TBD   3. What type of clearance is required (medical clearance vs. Pharmacy clearance to hold med vs. Both)? MEDICAL  4. Are there any medications that need to be held prior to surgery and how long? PLAVIX AND ASA   5. Practice name and name of physician performing surgery? [facility]; DR. Christia Reading [facility]URPHY   6. What is the office phone number? [phone]   7.   What is the office fax number? [place].   Anesthesia type (None, local, MAC, general) ? CHOICE   Julaine Hua 12/18/2020, 10:32 AM  _________________________________________________________________   (provider comments below)

## 2020-12-18 NOTE — Telephone Encounter (Signed)
Attempted to call the patient however she did not pick up the phone.  No way for me to leave a voicemail either.  We will try again later.

## 2020-12-18 NOTE — Telephone Encounter (Signed)
Received faxed risk assessment form from [facility] for patient's upcoming surgery. Placed in Dr. Idelle Leech inbox in front office to be signed.

## 2020-12-18 NOTE — Telephone Encounter (Signed)
We do 3 months of aspirin and plavix. If she cannot wait that long due to knee pain, it would be okay to temporarily hold antiplateltets at least 6 weeks out from mitraclip.

## 2020-12-18 NOTE — Telephone Encounter (Signed)
Form still on my desk. Patient will need appointment for surgical clearance. Tried contacting patient regarding this but no answer. Phone just continued to ring. Please assist with scheduling, thanks.

## 2020-12-19 ENCOUNTER — Telehealth: Payer: Self-pay | Admitting: Interventional Cardiology

## 2020-12-19 NOTE — Telephone Encounter (Signed)
Wrong encounter. 

## 2020-12-19 NOTE — Telephone Encounter (Signed)
Will send to Pre Op pool as it looked like Pre Op tried to reach the pt .

## 2020-12-19 NOTE — Telephone Encounter (Signed)
Follow up:     Patient returning a call back from yesterday.

## 2020-12-19 NOTE — Telephone Encounter (Signed)
   Primary Cardiologist: Sinclair Grooms, MD /Dr. Burt Knack, valve specialist  Chart reviewed as part of pre-operative protocol coverage. Patient was contacted 12/19/2020 in reference to pre-operative risk assessment for pending surgery as outlined below.  Erin [name] was last seen on 12/04/2019 by [name], PA.  Since that day, Erin [name] has done well without any chest pain or shortness of breath.  Recent cardiac catheterization in November 2021 showed normal coronary arteries.  She recently underwent MitraClip surgery on 11/06/2020.    Per recommendation by our structural heart team, ideally, patient should continue on aspirin and Plavix for 3 months before dropping the Plavix and continue aspirin monotherapy after that.  However, if absolutely needed, patient can temporarily hold aspirin and Plavix for 7 days after 6 weeks in order to do the surgery and restart both antiplatelet agents after the surgery (plavix will still be dropped after 3 month in this case).  Talking with the patient, she is willing to wait until after April 13th to do the surgery.  Since holding the to antiplatelet therapies will take 7 days, earliest surgery date should be April 20th.  Once she has completed the 24-month course of Plavix, she can permanently discontinue Plavix and only restart aspirin after the surgery.  With the above recommendation in mind, I will defer to the surgeon and patient to decide on the timing of the procedure.  Therefore, based on ACC/AHA guidelines, the patient would be at acceptable risk for the planned procedure without further cardiovascular testing.   The patient was advised that if she develops new symptoms prior to surgery to contact our office to arrange for a follow-up visit, and she verbalized understanding.  I will route this recommendation to the requesting party via Epic fax function and remove from pre-op pool. Please call with questions.  [place], [place] 12/19/2020, 8:53  AM

## 2020-12-19 NOTE — Telephone Encounter (Signed)
Noted  

## 2020-12-19 NOTE — Telephone Encounter (Signed)
Patient states surgery has been postponed until April. Scheduled surgical clearance appt for April 11th.

## 2020-12-23 DIAGNOSIS — R310 Gross hematuria: Secondary | ICD-10-CM

## 2020-12-23 HISTORY — DX: Gross hematuria: R31.0

## 2020-12-24 DIAGNOSIS — M4180 Other forms of scoliosis, site unspecified: Secondary | ICD-10-CM | POA: Diagnosis not present

## 2020-12-26 ENCOUNTER — Telehealth: Payer: Self-pay

## 2020-12-26 NOTE — Telephone Encounter (Signed)
   [facility] Pre-operative Risk Assessment    HEARTCARE STAFF: - Please ensure there is not already an duplicate clearance open for this procedure. - Under Visit Info/Reason for Call, type in Other and utilize the format Clearance MM/DD/YY or Clearance TBD. Do not use dashes or single digits. - If request is for dental extraction, please clarify the # of teeth to be extracted.  Request for surgical clearance:  1. What type of surgery is being performed? L3-4 TRANSLAMINAR EPIDURAL STEROID INJECTION   2. When is this surgery scheduled? TBD   3. What type of clearance is required (medical clearance vs. Pharmacy clearance to hold med vs. Both)? PHARAMCY  4. Are there any medications that need to be held prior to surgery and how long? PLAVIX AND ASA NEEDS INSTRUCTIONS  5. Practice name and name of physician performing surgery? [facility]    6. What is the office phone number? [phone]   7.   What is the office fax number? [place]: JESSICA   8.   Anesthesia type (None, local, MAC, general) ? NONE LISTED   Jacinta Shoe 12/26/2020, 4:43 PM  _________________________________________________________________   (provider comments below)

## 2020-12-26 NOTE — Telephone Encounter (Signed)
   Primary Cardiologist: Sinclair Grooms, MD / Dr. Sherren Mocha  Chart reviewed as part of pre-operative protocol coverage. Mrs.  PORSCHIA WILLBANKS had cardiac catheterization 09/16/2020 that showed normal coronary arteries. She underwent MitraClipprocedure on 1/13/2022and was discharged on aspirin andPlavix. Echocardiogram obtained on 12/03/2020 showed EF 60 to 65%, mild to moderate MR, moderate to severe TR. Typical recommendation for 3 months of Aspirin and Plavix post mitral clip.  However, per Dr. Burt Knack who performed her mitraclip "If the patient is having severe problems with back pain and needs to move forward with this, it would be acceptable to go ahead and interrupt her aspirin and clopidogrel 5 days before the procedure if needed.  She should resume these medications following epidural steroid injection when safe."  I will route this recommendation to the requesting party via Epic fax function and remove from pre-op pool.  Please call with questions.  Loel Dubonnet, NP 12/26/2020, 5:12 PM

## 2020-12-26 NOTE — Telephone Encounter (Signed)
If the patient is having severe problems with back pain and needs to move forward with this, it would be acceptable to go ahead and interrupt her aspirin and clopidogrel 5 days before the procedure if needed.  She should resume these medications following epidural steroid injection when safe.

## 2020-12-26 NOTE — Telephone Encounter (Signed)
   Primary Cardiologist: Sinclair Grooms, MD / Dr. Sherren Mocha / [name], PA  Chart reviewed as part of pre-operative protocol coverage.Mrs.  Erin [name] had cardiac catheterization 09/16/2020 that showed normal coronary arteries.  She underwent MitraClip procedure on 11/06/2020 and was discharged on aspirin and  Plavix.  Echocardiogram obtained on 12/03/2020 showed EF 60 to 65%, mild to moderate MR, moderate to severe TR. Typical recommendation for 3 months of Aspirin and Plavix post mitral clip. Per previous documentation from separate clearance request, if necessary can temporarily hold Aspirin and/or Plavix.   Will route to valve team regarding recommendations for holding Aspirin and Plavix prior to steroid injection.     Loel Dubonnet, NP 12/26/2020, 4:50 PM

## 2020-12-29 ENCOUNTER — Encounter: Payer: Self-pay | Admitting: Family Medicine

## 2021-01-13 ENCOUNTER — Telehealth: Payer: Self-pay | Admitting: Family Medicine

## 2021-01-13 ENCOUNTER — Other Ambulatory Visit: Payer: Self-pay

## 2021-01-13 NOTE — Telephone Encounter (Signed)
Attempted to schedule AWV. Unable to LVM.  Will try at later time.  

## 2021-01-14 ENCOUNTER — Encounter: Payer: Self-pay | Admitting: Family Medicine

## 2021-01-14 ENCOUNTER — Ambulatory Visit (INDEPENDENT_AMBULATORY_CARE_PROVIDER_SITE_OTHER): Payer: Medicare Other | Admitting: Family Medicine

## 2021-01-14 ENCOUNTER — Other Ambulatory Visit: Payer: Self-pay

## 2021-01-14 ENCOUNTER — Telehealth: Payer: Self-pay

## 2021-01-14 VITALS — BP 129/73 | HR 59 | Temp 97.6°F | Resp 16 | Ht 64.0 in | Wt 124.0 lb

## 2021-01-14 DIAGNOSIS — R310 Gross hematuria: Secondary | ICD-10-CM | POA: Diagnosis not present

## 2021-01-14 DIAGNOSIS — I340 Nonrheumatic mitral (valve) insufficiency: Secondary | ICD-10-CM | POA: Diagnosis not present

## 2021-01-14 DIAGNOSIS — R251 Tremor, unspecified: Secondary | ICD-10-CM

## 2021-01-14 DIAGNOSIS — R040 Epistaxis: Secondary | ICD-10-CM | POA: Diagnosis not present

## 2021-01-14 LAB — CBC
HCT: 42.2 % (ref 36.0–46.0)
Hemoglobin: 14 g/dL (ref 12.0–15.0)
MCHC: 33.1 g/dL (ref 30.0–36.0)
MCV: 88.9 fl (ref 78.0–100.0)
Platelets: 251 10*3/uL (ref 150.0–400.0)
RBC: 4.75 Mil/uL (ref 3.87–5.11)
RDW: 16.1 % — ABNORMAL HIGH (ref 11.5–15.5)
WBC: 6.6 10*3/uL (ref 4.0–10.5)

## 2021-01-14 LAB — POCT URINALYSIS DIPSTICK
Bilirubin, UA: NEGATIVE
Glucose, UA: NEGATIVE
Ketones, UA: NEGATIVE
Nitrite, UA: POSITIVE
Protein, UA: POSITIVE — AB
Spec Grav, UA: >=1.03 — AB (ref 1.010–1.025)
Urobilinogen, UA: 0.2 E.U./dL
pH, UA: 5 (ref 5.0–8.0)

## 2021-01-14 MED ORDER — SULFAMETHOXAZOLE-TRIMETHOPRIM 800-160 MG PO TABS: 1.0000 | ORAL_TABLET | Freq: Two times a day (BID) | ORAL | 0 refills | Status: AC

## 2021-01-14 NOTE — Telephone Encounter (Signed)
Spoke with patient regarding results/recommendations. Lab visit scheduled for 4/6, future UA dipstick order entered. Pt was advised urine sample would have to be completed in our office.

## 2021-01-14 NOTE — Progress Notes (Signed)
OFFICE VISIT  01/14/2021  CC:  Chief Complaint  Patient presents with  . Follow-up    RCI, 6 mo. Not fasting    HPI:    Patient is a 86 y.o. Caucasian female who presents for 6 mo f/u R hand tremor, HTN, and hx of mitral valve regurgitation.  Also has new problem: "blood in urine". A/P as of last visit: "1) R arm tremor x 5 mo, not noted today. No signs concerning for parkinsonism but obviously unilateral tremor can be a beginning sign of this.  Doubt essential tremor. No indication for imaging---entire neuro exam normal. Will hold off on neuro referral.  Watchful waiting approach. Signs/symptoms to call or return for were reviewed and pt expressed understanding.  2) Severe Mitral regurg; asymptomatic.  Has cardiology eval arranged for early Nov with Dr. Tamala Julian.  3) Chronic recurrent nocturnal leg cramps: I think it is ok to continue nightly tizanidine so I sent in 4mg  tabs, 1 qhs, #30, RF x 6."  INTERIM HX: Onset of blood in urine 2 nights ago.  No dysuria or urinary urgency or frequency. Reports hx of same "when I had kidney stones years ago", painless hematuria, ended up getting ESWL.   She got mitral valve clip implantation 11/06/20 and will be on ASA and plavix for 3 full months (ends approx 02/04/21), after which time she'll be on ASA alone indefinitely. She had card f/u 12/03/20, no changes/no new testing ordered other than echo that day (see [place] section below), apparently was hard to tell clinical improvement b/c pt had recently had flu/covid-like illness.  F/u 1 yr planned. Has had some L nostril nosebleeds so she stopped taking ASA, kept taking plavix.   Nosebleeds resolved with stopping ASA.  She tried restarting it again last week and immediately got nosebleed. Hx of L nasal turbinate surgery for nasal congestion---remote past.   She has signif secondary pulm HTN as a result of her longstanding severe MV regurg.  She rarely takes lasix for her LE swelling  anymore. Feels like R hand still shaking" all the time when I'm not doing housework with it".  Has chronic L knee pain d/t severe osteoarthritis, is planning with orthopedist (Dr. Percell Miller).    Past Medical History:  Diagnosis Date  . Achilles tendon rupture    left, no surgery required  . Hypertension   . Hypertensive retinopathy of both eyes    Dr. Herbert Deaner  . Low back pain    spondylosis, listhesis, +scoliosis at L/S jxn  . Lower extremity edema    lasix prn  . OA (osteoarthritis)   . Osteopenia   . S/P mitral valve clip implantation 11/06/2020   s/p TEER with one XTW MitraClip on A2P2 by Dr. Burt Knack.  DAPT w/ASA and plavix x 3 mo post procedure recommended  . Severe mitral regurgitation    2021 transthoracic echo and TEE->cardiology following.  Mitral valve clip 10/2020.  f/u echo 10/28/20 mild/mod MVR and tricusp regurg  . Severe pulmonary hypertension ([place]) 2021   transth echo; moderate by TEE 08/2020  . Systolic murmur 56/21/3086   severe mitral regurge, mod/severe tricuspic regurg    Past Surgical History:  Procedure Laterality Date  . APPENDECTOMY     86 yrs old  . CHOLECYSTECTOMY    . LUMBAR LAMINECTOMY  2004  . MITRAL VALVE REPAIR N/A 11/06/2020   Procedure: MITRAL VALVE REPAIR;  Surgeon: Sherren Mocha, MD;  Location: [facility];  Service: Cardiovascular;  Laterality: N/A;  .  RIGHT/LEFT HEART CATH AND CORONARY ANGIOGRAPHY N/A 09/16/2020   No CAD. Procedure: RIGHT/LEFT HEART CATH AND CORONARY ANGIOGRAPHY;  Surgeon: Belva Crome, MD;  Location: [facility];  Service: Cardiovascular;  Laterality: N/A;  . TEE WITHOUT CARDIOVERSION N/A 09/16/2020   Procedure: TRANSESOPHAGEAL ECHOCARDIOGRAM (TEE);  Surgeon: Buford Dresser, MD;  Location: [facility];  Service: Cardiovascular;  Laterality: N/A;  . TEE WITHOUT CARDIOVERSION N/A 11/06/2020   Procedure: TRANSESOPHAGEAL ECHOCARDIOGRAM (TEE);  Surgeon: Sherren Mocha, MD;  Location: [place] [facility];   Service: Cardiovascular;  Laterality: N/A;  . TONSILLECTOMY    . TRANSTHORACIC ECHOCARDIOGRAM  07/03/2020; 10/28/20; 12/03/20   06/2020 TTE EF 60-65%, grd II DD, severe pulm art HTN, severe mitral valve regurg, mod/sev tricuspic valve regurg. Conf on TEE 08/2020.  10/28/20 (s/p MV clip procedure) mild-mod MVR, EF 55-60%. 11/2020 EF 60%, mild/mod MR w/mean grad 14mm hg and mod to sev TR.    Outpatient Medications Prior to Visit  Medication Sig Dispense Refill  . Ascorbic Acid (VITAMIN C PO) Take 1 tablet by mouth daily.    . carvedilol (COREG) 25 MG tablet Take 1 tablet (25 mg total) by mouth daily. 90 tablet 3  . clopidogrel (PLAVIX) 75 MG tablet Take 1 tablet (75 mg total) by mouth daily with breakfast. 90 tablet 1  . loratadine (CLARITIN) 10 MG tablet TAKE ONE TABLET BY MOUTH EVERY DAY AS NEEDED FOR ALLERGY 90 tablet 3  . melatonin 5 MG TABS Take 5 mg by mouth at bedtime.    . Multiple Vitamin (MULTIVITAMIN WITH MINERALS) TABS tablet Take 1 tablet by mouth daily.    . Multiple Vitamins-Minerals (PRESERVISION AREDS PO) Take 1 tablet by mouth in the morning and at bedtime.     . Multiple Vitamins-Minerals (ZINC PO) Take 1 tablet by mouth daily.    . potassium chloride (KLOR-CON) 10 MEQ tablet Take 10 mEq by mouth daily as needed (when taking lasix).    Marland Kitchen VITAMIN D PO Take 3 capsules by mouth 2 (two) times daily.    Marland Kitchen VITAMIN E PO Take 1 capsule by mouth daily.    Marland Kitchen amoxicillin (AMOXIL) 500 MG tablet Take 4 tablets (2,000 mg) one hour prior to all dental visits. (Patient not taking: Reported on 01/14/2021) 8 tablet 11  . aspirin 81 MG chewable tablet Chew 1 tablet (81 mg total) by mouth daily. (Patient not taking: Reported on 01/14/2021)    . diazepam (VALIUM) 2 MG tablet Take 2 mg by mouth daily as needed for anxiety. (Patient not taking: Reported on 01/14/2021)    . furosemide (LASIX) 20 MG tablet Take 1 tablet (20 mg total) by mouth every Monday, Wednesday, and Friday. (Patient not taking: Reported on  01/14/2021) 36 tablet 0  . nitroGLYCERIN (NITRODUR - DOSED IN MG/24 HR) 0.2 mg/hr patch 1/2 patch over L Achilles tendon bid (Patient not taking: Reported on 01/14/2021) 30 patch 12   No facility-administered medications prior to visit.    Allergies  Allergen Reactions  . Barbiturates Nausea And Vomiting  . Demerol Other (See Comments)    sick  . Hydrochlorothiazide Other (See Comments)    unknown  . Irbesartan     HA  . Losartan Potassium     hair loss  . Olmesartan Medoxomil     hair loss  . Procaine Other (See Comments)    unknown  . Telmisartan     cramps    ROS As per HPI  PE: Vitals with BMI 01/14/2021 12/03/2020  11/07/2020  Height 5\' 4"  5\' 4"  -  Weight 124 lbs 120 lbs 10 oz -  BMI 69.48 54.62 -  Systolic 703 500 -  Diastolic 73 80 -  Pulse 59 62 66   Gen: Alert, well appearing.  Patient is oriented to person, place, time, and situation. AFFECT: pleasant, lucid thought and speech. R nostril normal. L nostril with 2 scabs/granulation tissue on septum and roof of nasal ala.  No active bleeding. CV: RRR, 9-3/8 systolic murmur heard best around L SB/apex. EXT: no clubbing or cyanosis.  no edema.  Neuro: CN 2-12 intact bilaterally, strength 5/5 in proximal and distal upper extremities and lower extremities bilaterally.  No sensory deficits.  No tremor.  No ataxia. No cogwheel rigidity. No pronator drift.    LABS:  Lab Results  Component Value Date   TSH 2.37 07/02/2020   Lab Results  Component Value Date   WBC 10.4 11/07/2020   HGB 12.4 11/07/2020   HCT 39.4 11/07/2020   MCV 91.4 11/07/2020   PLT 201 11/07/2020   Lab Results  Component Value Date   CREATININE 0.74 11/07/2020   BUN 16 11/07/2020   NA 141 11/07/2020   K 4.3 11/07/2020   CL 106 11/07/2020   CO2 26 11/07/2020   Lab Results  Component Value Date   ALT 18 11/04/2020   AST 17 11/04/2020   ALKPHOS 66 11/04/2020   BILITOT 1.0 11/04/2020   Lab Results  Component Value Date   CHOL 139  07/02/2020   Lab Results  Component Value Date   HDL 80.30 07/02/2020   Lab Results  Component Value Date   LDLCALC 53 07/02/2020   Lab Results  Component Value Date   TRIG 30.0 07/02/2020   Lab Results  Component Value Date   CHOLHDL 2 07/02/2020   UA: 3+ blood, nitrite pos, trace leuks  IMPRESSION AND PLAN:  1) HTN; well controlled. Lytes/cr good 11/07/20. BMET today.  2) Mitral valve regurgitation; improved by echo since mitral valve clip procedure. Due to her self d/c of her ASA, the plan to stay on DAPT x 110mo after procedure has been disrupted.  Since having ongoing bleeding issues (see below), will cont as current-->plavix 75mg  qd but no ASA.  Plan switch to ASA 81mg  qd alone starting 02/04/21.  3) Gross hematuria, painless. UA with ? Sign of infection.   Will send for c/s and call pt and get her to start abx (bactrim ds bid x 5d) and plan recheck urine 2 wks. Cbc and bmet today. Urol ref. Ok to cont plavix for now but cont hold ASA. If Hb down then will have her d/c plavix as well.  4) Epistaxis; question of area in need of cauterization in L nostril. ENT ref ordered. She will stay on plavix 75mg  qd and continue to hole her ASA. If hb has dropped then will have her hold plavix as well.  5) R hand tremor: again, I have not detected any tremor on exam. Reassured at this time.  6) Osteoarthritis knees and back: pt states she wants to proceed with TKA she has been planning with her orthopedist, so I expect that will take place later this summer.  An After Visit Summary was printed and given to the patient.  FOLLOW UP: Return in about 3 months (around 04/16/2021) for routine chronic illness f/u.  Signed:  Crissie Sickles, MD           01/14/2021

## 2021-01-14 NOTE — Telephone Encounter (Signed)
[  11:47 AM] [name], Erin pls call Erin [name] and tell her that her urine did show sign of infection and this could possibly be the cause of her blood in urine.  I sent in abx for her to take and want her to come back for lab visit to give urine for dipstick UA in 2 wks, dx gross hematuria-thx

## 2021-01-15 LAB — IRON,TIBC AND FERRITIN PANEL
%SAT: 18 % (calc) (ref 16–45)
Ferritin: 80 ng/mL (ref 16–288)
Iron: 54 ug/dL (ref 45–160)
TIBC: 299 mcg/dL (calc) (ref 250–450)

## 2021-01-15 LAB — COMPREHENSIVE METABOLIC PANEL
AG Ratio: 1.6 (calc) (ref 1.0–2.5)
ALT: 12 U/L (ref 6–29)
AST: 18 U/L (ref 10–35)
Albumin: 3.9 g/dL (ref 3.6–5.1)
Alkaline phosphatase (APISO): 68 U/L (ref 37–153)
BUN: 16 mg/dL (ref 7–25)
CO2: 25 mmol/L (ref 20–32)
Calcium: 9 mg/dL (ref 8.6–10.4)
Chloride: 105 mmol/L (ref 98–110)
Creat: 0.61 mg/dL (ref 0.60–0.88)
Globulin: 2.5 g/dL (calc) (ref 1.9–3.7)
Glucose, Bld: 95 mg/dL (ref 65–99)
Potassium: 4.5 mmol/L (ref 3.5–5.3)
Sodium: 142 mmol/L (ref 135–146)
Total Bilirubin: 0.6 mg/dL (ref 0.2–1.2)
Total Protein: 6.4 g/dL (ref 6.1–8.1)

## 2021-01-16 LAB — URINE CULTURE
MICRO NUMBER:: 11683167
SPECIMEN QUALITY:: ADEQUATE

## 2021-01-28 ENCOUNTER — Other Ambulatory Visit: Payer: Self-pay

## 2021-01-28 ENCOUNTER — Other Ambulatory Visit: Payer: Self-pay | Admitting: Family Medicine

## 2021-01-28 ENCOUNTER — Ambulatory Visit (INDEPENDENT_AMBULATORY_CARE_PROVIDER_SITE_OTHER): Payer: Medicare Other

## 2021-01-28 DIAGNOSIS — R310 Gross hematuria: Secondary | ICD-10-CM

## 2021-01-28 DIAGNOSIS — N390 Urinary tract infection, site not specified: Secondary | ICD-10-CM

## 2021-01-28 DIAGNOSIS — Z87442 Personal history of urinary calculi: Secondary | ICD-10-CM

## 2021-01-28 LAB — POCT URINALYSIS DIPSTICK
Bilirubin, UA: NEGATIVE
Glucose, UA: NEGATIVE
Ketones, UA: NEGATIVE
Nitrite, UA: POSITIVE
Protein, UA: POSITIVE — AB
Spec Grav, UA: 1.02 (ref 1.010–1.025)
Urobilinogen, UA: 0.2 E.U./dL
pH, UA: 6 (ref 5.0–8.0)

## 2021-01-28 MED ORDER — CIPROFLOXACIN HCL 500 MG PO TABS: 500.0000 mg | ORAL_TABLET | Freq: Two times a day (BID) | ORAL | 0 refills | Status: AC

## 2021-01-28 NOTE — Telephone Encounter (Addendum)
Verified with Dr.Smith's office that surgical clearance forms received on 2/24 and recommendations faxed back on 2/25. Spoke with pt regarding urology appt, appt scheduled for early may.

## 2021-01-28 NOTE — Telephone Encounter (Signed)
Surgical clearance forms received on 12/18/20. Patient was last seen on 01/14/21 and told she could cancel surgical clearance appt on 4/11. Forms have been placed on PCP desk for completion.

## 2021-01-28 NOTE — Telephone Encounter (Signed)
I'm waiting on her urology evaluation before signing off on any presurgical clearance forms. Also, make sure her orthopedist is asking her cardiologist for cardiac clearance (I'll sign off only on the medical clearance part of things)--thx

## 2021-01-30 LAB — URINE CULTURE
MICRO NUMBER:: 11739722
SPECIMEN QUALITY:: ADEQUATE

## 2021-02-02 ENCOUNTER — Ambulatory Visit: Payer: Medicare Other | Admitting: Family Medicine

## 2021-02-04 ENCOUNTER — Ambulatory Visit (HOSPITAL_BASED_OUTPATIENT_CLINIC_OR_DEPARTMENT_OTHER)
Admission: RE | Admit: 2021-02-04 | Discharge: 2021-02-04 | Disposition: A | Discharge status: Home / Self Care | Payer: Medicare Other | Source: Ambulatory Visit | Attending: Family Medicine | Admitting: Family Medicine

## 2021-02-04 ENCOUNTER — Other Ambulatory Visit: Payer: Self-pay

## 2021-02-04 DIAGNOSIS — N200 Calculus of kidney: Secondary | ICD-10-CM | POA: Diagnosis not present

## 2021-02-04 DIAGNOSIS — Z87442 Personal history of urinary calculi: Secondary | ICD-10-CM | POA: Diagnosis not present

## 2021-02-04 DIAGNOSIS — N390 Urinary tract infection, site not specified: Secondary | ICD-10-CM | POA: Diagnosis not present

## 2021-02-04 DIAGNOSIS — R310 Gross hematuria: Secondary | ICD-10-CM | POA: Insufficient documentation

## 2021-02-04 DIAGNOSIS — N281 Cyst of kidney, acquired: Secondary | ICD-10-CM | POA: Diagnosis not present

## 2021-02-04 DIAGNOSIS — K838 Other specified diseases of biliary tract: Secondary | ICD-10-CM | POA: Diagnosis not present

## 2021-02-04 MED ORDER — IOHEXOL 300 MG/ML  SOLN
100.0000 mL | Freq: Once | INTRAMUSCULAR | Status: AC | PRN
  Administered 2021-02-04: 100 mL via INTRAVENOUS

## 2021-02-09 ENCOUNTER — Encounter: Payer: Self-pay | Admitting: Family Medicine

## 2021-02-24 DIAGNOSIS — N281 Cyst of kidney, acquired: Secondary | ICD-10-CM | POA: Diagnosis not present

## 2021-02-24 DIAGNOSIS — R310 Gross hematuria: Secondary | ICD-10-CM | POA: Diagnosis not present

## 2021-02-24 HISTORY — PX: CYSTOSCOPY: SUR368

## 2021-03-01 NOTE — Progress Notes (Signed)
Cardiology Office Note:    Date:  03/03/2021   ID:  Erin [name], DOB 1935/03/02, MRN [id number]  PCP:  Tammi Sou, MD  Cardiologist:  Sinclair Grooms, MD   Referring MD: Tammi Sou, MD   Chief Complaint  Patient presents with  . Cardiac Valve Problem    History of Present Illness:    Erin [name] is a 86 y.o. female with a hx of HTN, OA, chronic diastolic CHF, mitral valve prolapse with severe MR s/p MitraClip (11/06/20) reduced to mild to moderate.  She feels well.  There is more energy now when she ambulates and she had before.  Problem is that both her back and left knee prevent her from being as active as she would like to be.  She did undergo MitraClip.  She was on dual antiplatelet therapy for 3 months which is now been discontinued.  MitraClip was successful reducing severe MR down to 1-2+ MR by echo.  She has still has a significant anteriorly directed murmur.  She seeks clearance to have lumbar injection by Dr. Kristeen Miss.  She further seeks to have left knee replacement surgery by Dr. Percell Miller  Past Medical History:  Diagnosis Date  . Achilles tendon rupture    left, no surgery required  . Gross hematuria 12/2020   Klebsiella on urine clx x2 but no UTI sx's.  CT showed tiny stones in each renal pelvis but no ureteral or bladder stones, no mass.  Pt to see urol for consideration of cystoscopy  . Hypertension   . Hypertensive retinopathy of both eyes    Dr. Herbert Deaner  . Low back pain    spondylosis, listhesis, +scoliosis at L/S jxn  . Lower extremity edema    lasix prn  . OA (osteoarthritis)   . Osteopenia   . S/P mitral valve clip implantation 11/06/2020   s/p TEER with one XTW MitraClip on A2P2 by Dr. Burt Knack.  DAPT w/ASA and plavix x 3 mo post procedure recommended  . Severe mitral regurgitation    2021 transthoracic echo and TEE->cardiology following.  Mitral valve clip 10/2020.  f/u echo 10/28/20 mild/mod MVR and tricusp regurg  . Severe pulmonary  hypertension ([place]) 2021   transth echo; moderate by TEE 08/2020  . Systolic murmur 40/05/6760   severe mitral regurge, mod/severe tricuspic regurg    Past Surgical History:  Procedure Laterality Date  . APPENDECTOMY     86 yrs old  . CHOLECYSTECTOMY    . LUMBAR LAMINECTOMY  2004  . MITRAL VALVE REPAIR N/A 11/06/2020   Procedure: MITRAL VALVE REPAIR;  Surgeon: Sherren Mocha, MD;  Location: [place] [facility];  Service: Cardiovascular;  Laterality: N/A;  . RIGHT/LEFT HEART CATH AND CORONARY ANGIOGRAPHY N/A 09/16/2020   No CAD. Procedure: RIGHT/LEFT HEART CATH AND CORONARY ANGIOGRAPHY;  Surgeon: Belva Crome, MD;  Location: [place] [facility];  Service: Cardiovascular;  Laterality: N/A;  . TEE WITHOUT CARDIOVERSION N/A 09/16/2020   Procedure: TRANSESOPHAGEAL ECHOCARDIOGRAM (TEE);  Surgeon: Buford Dresser, MD;  Location: [facility];  Service: Cardiovascular;  Laterality: N/A;  . TEE WITHOUT CARDIOVERSION N/A 11/06/2020   Procedure: TRANSESOPHAGEAL ECHOCARDIOGRAM (TEE);  Surgeon: Sherren Mocha, MD;  Location: [place] [facility];  Service: Cardiovascular;  Laterality: N/A;  . TONSILLECTOMY    . TRANSTHORACIC ECHOCARDIOGRAM  07/03/2020; 10/28/20; 12/03/20   06/2020 TTE EF 60-65%, grd II DD, severe pulm art HTN, severe mitral valve regurg, mod/sev tricuspic valve regurg. Conf on TEE 08/2020.  10/28/20 (s/p MV clip procedure) mild-mod MVR, EF 55-60%. 11/2020 EF 60%, mild/mod MR w/mean grad 65mm hg and mod to sev TR.    Current Medications: Current Meds  Medication Sig  . amoxicillin (AMOXIL) 500 MG tablet Take 4 tablets (2,000 mg) one hour prior to all dental visits.  . Ascorbic Acid (VITAMIN C PO) Take 1 tablet by mouth daily.  . carvedilol (COREG) 25 MG tablet Take 1 tablet (25 mg total) by mouth daily.  . diazepam (VALIUM) 2 MG tablet Take 2 mg by mouth daily as needed for anxiety.  Marland Kitchen loratadine (CLARITIN) 10 MG tablet TAKE ONE TABLET BY MOUTH EVERY DAY AS NEEDED FOR ALLERGY  .  melatonin 5 MG TABS Take 5 mg by mouth at bedtime.  . Multiple Vitamin (MULTIVITAMIN WITH MINERALS) TABS tablet Take 1 tablet by mouth daily.  . Multiple Vitamins-Minerals (PRESERVISION AREDS PO) Take 1 tablet by mouth in the morning and at bedtime.   . Multiple Vitamins-Minerals (ZINC PO) Take 1 tablet by mouth daily.  . nitroGLYCERIN (NITRODUR - DOSED IN MG/24 HR) 0.2 mg/hr patch 1/2 patch over L Achilles tendon bid  . potassium chloride (KLOR-CON) 10 MEQ tablet Take 10 mEq by mouth daily as needed (when taking lasix).  Marland Kitchen VITAMIN D PO Take 3 capsules by mouth 2 (two) times daily.  . [DISCONTINUED] aspirin 81 MG chewable tablet Chew 1 tablet (81 mg total) by mouth daily.  . [DISCONTINUED] clopidogrel (PLAVIX) 75 MG tablet Take 1 tablet (75 mg total) by mouth daily with breakfast.  . [DISCONTINUED] VITAMIN E PO Take 1 capsule by mouth daily.     Allergies:   Barbiturates, Demerol, Hydrochlorothiazide, Irbesartan, Losartan potassium, Olmesartan medoxomil, Procaine, and Telmisartan   Social History   Socioeconomic History  . Marital status: Married    Spouse name: Not on file  . Number of children: Not on file  . Years of education: Not on file  . Highest education level: Not on file  Occupational History  . Not on file  Tobacco Use  . Smoking status: Never Smoker  . Smokeless tobacco: Never Used  Vaping Use  . Vaping Use: Never used  Substance and Sexual Activity  . Alcohol use: No  . Drug use: Never  . Sexual activity: Not Currently  Other Topics Concern  . Not on file  Social History Narrative   Married, 6 children, about 15 GC.  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   No tob.   No alc.   Social Determinants of Health   Financial Resource Strain: Not on file  Food Insecurity: Not on file  Transportation Needs: Not on file  Physical Activity: Not on file  Stress: Not on file  Social Connections: Not  on file     Family History: The patient's family history includes Cancer in her father; Early death in her father; Hypertension in her mother and another family member.  ROS:   Please see the history of present illness.    Difficulty ambulating and significant back pain..  All other systems reviewed and are negative.  EKGs/Labs/Other Studies Reviewed:    The following studies were reviewed today:  ECHOCARDIOGRAPHY 11/2020 IMPRESSIONS  1. Left ventricular ejection fraction, by estimation, is 60 to 65%. The  left ventricle has normal function. The left ventricle has no regional  wall motion abnormalities. Left ventricular diastolic function could not  be evaluated.  2. Right ventricular systolic function is normal. The right ventricular  size is normal. There is moderately elevated pulmonary artery systolic  pressure. The estimated right ventricular systolic pressure is 10.2 mmHg.  3. Left atrial size was severely dilated.  4. Residual iatrogenic ASD from trans-septal puncture is seen. There is a  small secundum atrial septal defect with exclusively left to right  shunting across the atrial septum.  5. Residual mitral insufficiency originates primarily lateral to the  Mitraclip, which is located A2-P2. The jet is oriented anteriorly and  towards the septum. The mitral valve has been repaired/replaced. Mild to  moderate mitral valve regurgitation. Mild  mitral stenosis. The mean mitral valve gradient is 4.0 mmHg with average  heart rate of 66 bpm.  6. Tricuspid valve regurgitation is moderate to severe.  7. The aortic valve is tricuspid. Aortic valve regurgitation is trivial.  Mild aortic valve sclerosis is present, with no evidence of aortic valve  stenosis.  8. The inferior vena cava is normal in size with <50% respiratory  variability, suggesting right atrial pressure of 8 mmHg.  9. Right atrial size was mildly dilated.   Comparison(s): No significant change from  prior study. Prior images  reviewed side by side.  EKG:  EKG not repeated  Recent Labs: 07/02/2020: Magnesium 1.9; TSH 2.37 01/14/2021: ALT 12; BUN 16; Creat 0.61; Hemoglobin 14.0; Platelets 251.0; Potassium 4.5; Sodium 142  Recent Lipid Panel    Component Value Date/Time   CHOL 139 07/02/2020 1057   TRIG 30.0 07/02/2020 1057   TRIG 33 09/21/2006 1115   HDL 80.30 07/02/2020 1057   CHOLHDL 2 07/02/2020 1057   VLDL 6.0 07/02/2020 1057   LDLCALC 53 07/02/2020 1057    Physical Exam:    VS:  BP (!) 142/74   Pulse (!) 58   Ht 5\' 4"  (1.626 m)   Wt 128 lb (58.1 kg)   SpO2 96%   BMI 21.97 kg/m     Wt Readings from Last 3 Encounters:  03/03/21 128 lb (58.1 kg)  01/14/21 124 lb (56.2 kg)  12/03/20 120 lb 9.6 oz (54.7 kg)     GEN: Elderly, frail.. No acute distress HEENT: Normal NECK: No JVD. LYMPHATICS: No lymphadenopathy CARDIAC: 2-3 over 6 anteriorly directed holosystolic mitral regurgitation murmur from left lower sternal border, to left axilla, to right upper sternal border.  Murmur. RRR no gallop, or edema. VASCULAR:  Normal Pulses. No bruits. RESPIRATORY:  Clear to auscultation without rales, wheezing or rhonchi  ABDOMEN: Soft, non-tender, non-distended, No pulsatile mass, MUSCULOSKELETAL: No deformity  SKIN: Warm and dry NEUROLOGIC:  Alert and oriented x 3 PSYCHIATRIC:  Normal affect   ASSESSMENT:    1. S/P mitral valve clip implantation   2. Chronic diastolic heart failure ([place])   3. Severe pulmonary hypertension ([place])   4. Essential hypertension   5. Osteoarthritis of knee, unspecified laterality, unspecified osteoarthritis type    PLAN:    In order of problems listed above:  1. As post MitraClip.  Residual mitral regurgitation is mild to moderate.  No evidence of heart failure.  Off dual antiplatelet therapy. 2. No evidence of volume overload or active heart failure. 3. Pulmonary pressures are improved although did not normalize after MitraClip. 4. Excellent  blood pressure for age.  140/80 is at target. 5. She is cleared for upcoming lumbar injection by Dr. Kristeen Miss. 6. She is cleared for upcoming left knee replacement by Dr. Edmonia Lynch.  58-month follow-up  Medication Adjustments/Labs and Tests Ordered: Current medicines are reviewed at length with the patient today.  Concerns regarding medicines are outlined above.  No orders of the defined types were placed in this encounter.  No orders of the defined types were placed in this encounter.   There are no Patient Instructions on file for this visit.   Signed, Sinclair Grooms, MD  03/03/2021 3:20 PM    [facility]

## 2021-03-03 ENCOUNTER — Ambulatory Visit (INDEPENDENT_AMBULATORY_CARE_PROVIDER_SITE_OTHER): Payer: Medicare Other | Admitting: Interventional Cardiology

## 2021-03-03 ENCOUNTER — Other Ambulatory Visit: Payer: Self-pay

## 2021-03-03 ENCOUNTER — Encounter: Payer: Self-pay | Admitting: Interventional Cardiology

## 2021-03-03 VITALS — BP 142/74 | HR 58 | Ht 64.0 in | Wt 128.0 lb

## 2021-03-03 DIAGNOSIS — Z95818 Presence of other cardiac implants and grafts: Secondary | ICD-10-CM

## 2021-03-03 DIAGNOSIS — Z9889 Other specified postprocedural states: Secondary | ICD-10-CM | POA: Diagnosis not present

## 2021-03-03 DIAGNOSIS — I272 Pulmonary hypertension, unspecified: Secondary | ICD-10-CM

## 2021-03-03 DIAGNOSIS — I5032 Chronic diastolic (congestive) heart failure: Secondary | ICD-10-CM

## 2021-03-03 DIAGNOSIS — M1710 Unilateral primary osteoarthritis, unspecified knee: Secondary | ICD-10-CM

## 2021-03-03 DIAGNOSIS — I10 Essential (primary) hypertension: Secondary | ICD-10-CM | POA: Diagnosis not present

## 2021-03-03 DIAGNOSIS — M179 Osteoarthritis of knee, unspecified: Secondary | ICD-10-CM

## 2021-03-03 NOTE — Patient Instructions (Signed)

## 2021-03-09 DIAGNOSIS — M5416 Radiculopathy, lumbar region: Secondary | ICD-10-CM | POA: Diagnosis not present

## 2021-03-09 DIAGNOSIS — M5116 Intervertebral disc disorders with radiculopathy, lumbar region: Secondary | ICD-10-CM | POA: Diagnosis not present

## 2021-04-16 ENCOUNTER — Ambulatory Visit: Payer: Medicare Other | Admitting: Family Medicine

## 2021-04-22 ENCOUNTER — Telehealth: Payer: Self-pay

## 2021-04-22 ENCOUNTER — Encounter: Payer: Self-pay | Admitting: Family Medicine

## 2021-04-22 NOTE — Telephone Encounter (Signed)
Surgical clearance forms received on 04/21/21. Forms have been placed on Britt's desk. Notified pt to have [facility] to send records to complete surgical clearance.

## 2021-04-22 NOTE — Telephone Encounter (Signed)
Placed on PCP desk to review and sign, if appropriate.  

## 2021-04-22 NOTE — Telephone Encounter (Signed)
Form completed and placed on Britt's desk.

## 2021-04-23 NOTE — Telephone Encounter (Signed)
Form along with PCP last o/v and [facility] o/v from 5/3 faxed to Raliegh Ip, [place]

## 2021-05-08 ENCOUNTER — Other Ambulatory Visit: Payer: Self-pay

## 2021-05-08 ENCOUNTER — Encounter (INDEPENDENT_AMBULATORY_CARE_PROVIDER_SITE_OTHER): Payer: Medicare Other | Admitting: Ophthalmology

## 2021-05-08 DIAGNOSIS — I10 Essential (primary) hypertension: Secondary | ICD-10-CM | POA: Diagnosis not present

## 2021-05-08 DIAGNOSIS — H348312 Tributary (branch) retinal vein occlusion, right eye, stable: Secondary | ICD-10-CM | POA: Diagnosis not present

## 2021-05-08 DIAGNOSIS — H35033 Hypertensive retinopathy, bilateral: Secondary | ICD-10-CM | POA: Diagnosis not present

## 2021-05-08 DIAGNOSIS — H353121 Nonexudative age-related macular degeneration, left eye, early dry stage: Secondary | ICD-10-CM | POA: Diagnosis not present

## 2021-05-08 DIAGNOSIS — H43813 Vitreous degeneration, bilateral: Secondary | ICD-10-CM

## 2021-05-11 DIAGNOSIS — M25562 Pain in left knee: Secondary | ICD-10-CM | POA: Diagnosis not present

## 2021-05-18 NOTE — H&P (Signed)
KNEE ARTHROPLASTY ADMISSION H&P  Patient ID: Erin [name] MRN: [id number] DOB/AGE: 84-Apr-1936 84 y.o.  Chief Complaint: left knee pain.  Planned Procedure Date: 06/02/21 Medical Clearance by Dr. Shawnie Dapper   Cardiac Clearance by Dr. Pernell Dupre    HPI: Erin [name] is a 86 y.o. female who presents for evaluation of OA LEFT KNEE. The patient has a history of pain and functional disability in the left knee due to arthritis and has failed non-surgical conservative treatments for greater than 12 weeks to include NSAID's and/or analgesics, corticosteriod injections, viscosupplementation injections, use of assistive devices, and activity modification.  Onset of symptoms was gradual, starting 5 years ago with gradually worsening course since that time. The patient noted no past surgery on the left knee.  Patient currently rates pain at 5 out of 10 with activity. Patient has night pain, worsening of pain with activity and weight bearing, and pain that interferes with activities of daily living.  Patient has evidence of subchondral sclerosis, periarticular osteophytes, and joint space narrowing by imaging studies.  There is no active infection.  Past Medical History:  Diagnosis Date   Achilles tendon rupture    left, no surgery required   Gross hematuria 12/2020   Klebsiella on urine clx x2 but no UTI sx's.  CT showed tiny stones in each renal pelvis but no ureteral or bladder stones, no mass.  Urol did cysto->normal. Plan is annual UA, rpt hematuria w/u in 3-5 yrs if persistent pos.   Hypertension    Hypertensive retinopathy of both eyes    Dr. Herbert Deaner   Low back pain    spondylosis, listhesis, +scoliosis at L/S jxn   Lower extremity edema    lasix prn   OA (osteoarthritis)    Osteopenia    S/P mitral valve clip implantation 11/06/2020   s/p TEER with one XTW MitraClip on A2P2 by Dr. Burt Knack.  DAPT w/ASA and plavix x 3 mo post procedure recommended   Severe mitral regurgitation    2021  transthoracic echo and TEE->cardiology following.  Mitral valve clip 10/2020.  f/u echo 10/28/20 mild/mod MVR and tricusp regurg   Severe pulmonary hypertension ([place]) 2021   transth echo; moderate by TEE [id number]   Systolic murmur 0000000   severe mitral regurge, mod/severe tricuspic regurg   Past Surgical History:  Procedure Laterality Date   APPENDECTOMY     86 yrs old   CHOLECYSTECTOMY     LUMBAR LAMINECTOMY  2004   MITRAL VALVE REPAIR N/A 11/06/2020   Procedure: MITRAL VALVE REPAIR;  Surgeon: Sherren Mocha, MD;  Location: [place] [facility];  Service: Cardiovascular;  Laterality: N/A;   RIGHT/LEFT HEART CATH AND CORONARY ANGIOGRAPHY N/A 09/16/2020   No CAD. Procedure: RIGHT/LEFT HEART CATH AND CORONARY ANGIOGRAPHY;  Surgeon: Belva Crome, MD;  Location: [place] [facility];  Service: Cardiovascular;  Laterality: N/A;   TEE WITHOUT CARDIOVERSION N/A 09/16/2020   Procedure: TRANSESOPHAGEAL ECHOCARDIOGRAM (TEE);  Surgeon: Buford Dresser, MD;  Location: [facility];  Service: Cardiovascular;  Laterality: N/A;   TEE WITHOUT CARDIOVERSION N/A 11/06/2020   Procedure: TRANSESOPHAGEAL ECHOCARDIOGRAM (TEE);  Surgeon: Sherren Mocha, MD;  Location: [place] [facility];  Service: Cardiovascular;  Laterality: N/A;   TONSILLECTOMY     TRANSTHORACIC ECHOCARDIOGRAM  07/03/2020; 10/28/20; 12/03/20   06/2020 TTE EF 60-65%, grd II DD, severe pulm art HTN, severe mitral valve regurg, mod/sev tricuspic valve regurg. Conf on TEE 08/2020.  10/28/20 (s/p MV clip procedure) mild-mod MVR, EF 55-60%. 11/2020  EF 60%, mild/mod MR w/mean grad 57m hg and mod to sev TR.   Allergies  Allergen Reactions   Barbiturates Nausea And Vomiting   Demerol Other (See Comments)    sick   Hydrochlorothiazide Other (See Comments)    unknown   Irbesartan     HA   Losartan Potassium     hair loss   Olmesartan Medoxomil     hair loss   Procaine Other (See Comments)    unknown   Telmisartan     cramps   Prior to  Admission medications   Medication Sig Start Date End Date Taking? Authorizing Provider  amoxicillin (AMOXIL) 500 MG tablet Take 4 tablets (2,000 mg) one hour prior to all dental visits. 12/04/20   TEileen Stanford PA-C  Ascorbic Acid (VITAMIN C PO) Take 1 tablet by mouth daily.    [provider]  carvedilol (COREG) 25 MG tablet Take 1 tablet (25 mg total) by mouth daily. 07/02/20   McGowen, PAdrian Blackwater MD  diazepam (VALIUM) 2 MG tablet Take 2 mg by mouth daily as needed for anxiety.    [provider]  furosemide (LASIX) 20 MG tablet Take 1 tablet (20 mg total) by mouth every Monday, Wednesday, and Friday. Patient not taking: Reported on 01/14/2021 09/22/20 12/21/20  CSherren Mocha MD  loratadine (CLARITIN) 10 MG tablet TAKE ONE TABLET BY MOUTH EVERY DAY AS NEEDED FOR ALLERGY 01/30/20   Plotnikov, AEvie Lacks MD  melatonin 5 MG TABS Take 5 mg by mouth at bedtime.    [provider]  Multiple Vitamin (MULTIVITAMIN WITH MINERALS) TABS tablet Take 1 tablet by mouth daily.    [provider]  Multiple Vitamins-Minerals (PRESERVISION AREDS PO) Take 1 tablet by mouth in the morning and at bedtime.     [provider]  Multiple Vitamins-Minerals (ZINC PO) Take 1 tablet by mouth daily.    [provider]  nitroGLYCERIN (NITRODUR - DOSED IN MG/24 HR) 0.2 mg/hr patch 1/2 patch over L Achilles tendon bid 07/24/19   Plotnikov, AEvie Lacks MD  potassium chloride (KLOR-CON) 10 MEQ tablet Take 10 mEq by mouth daily as needed (when taking lasix).    [provider]  VITAMIN D PO Take 3 capsules by mouth 2 (two) times daily.    [provider]   Social History   Socioeconomic History   Marital status: Married    Spouse name: Not on file   Number of children: Not on file   Years of education: Not on file   Highest education level: Not on file  Occupational History   Not on file  Tobacco Use   Smoking status: Never   Smokeless tobacco:  Never  Vaping Use   Vaping Use: Never used  Substance and Sexual Activity   Alcohol use: No   Drug use: Never   Sexual activity: Not Currently  Other Topics Concern   Not on file  Social History Narrative   Married, 6 children, about 140GC.  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   No alc.   Social Determinants of Health   Financial Resource Strain: Not on file  Food Insecurity: Not on file  Transportation Needs: Not on file  Physical Activity: Not on file  Stress: Not on file  Social Connections: Not on file   Family History  Problem Relation  Age of Onset   Hypertension Mother    Early death Father    Cancer Father    Hypertension Other     ROS: Currently denies lightheadedness, dizziness, Fever, chills, CP, SOB.   No personal history of DVT, PE, or MI.  Heart cath and CVA in 2013. No loose teeth or dentures. All other systems have been reviewed and were otherwise currently negative with the exception of those mentioned in the HPI and as above.  Objective: Vitals: Ht: 5'2" Wt: 130 lbs Temp: 97.9 BP: 150/66 Pulse: 74 O2 96% on room air.   Physical Exam: General: Alert, NAD.  Antalgic Gait  HEENT: EOMI, Good Neck Extension  Pulm: No increased work of breathing.  Clear B/L A/P w/o crackle or wheeze.  CV: RRR, Systolic murmur. No g/r appreciated GI: soft, NT, ND. Normal BS Neuro: CN II-XII grossly intact without focal deficit.  Sensation intact distally Skin: No lesions in the area of chief complaint MSK/Surgical Site:  No JLT. ROM 0/100.  5/5 strength in extension and flexion.  +EHL/FHL.  NVI.  Stable varus and valgus stress.    Imaging Review Plain radiographs demonstrate severe degenerative joint disease of the left knee.   The overall alignment isneutral. The bone quality appears to be fair for age and reported activity level.  Preoperative templating of the joint replacement has  been completed, documented, and submitted to the Operating Room personnel in order to optimize intra-operative equipment management.  Assessment: OA LEFT KNEE Principal Problem:   Osteoarthritis of left knee   Plan: Plan for Procedure(s): TOTAL KNEE ARTHROPLASTY  The patient history, physical exam, clinical judgement of the provider and imaging are consistent with end stage degenerative joint disease and total joint arthroplasty is deemed medically necessary. The treatment options including medical management, injection therapy, and arthroplasty were discussed at length. The risks and benefits of Procedure(s): TOTAL KNEE ARTHROPLASTY were presented and reviewed.  The risks of nonoperative treatment, versus surgical intervention including but not limited to continued pain, aseptic loosening, stiffness, dislocation/subluxation, infection, bleeding, nerve injury, blood clots, cardiopulmonary complications, morbidity, mortality, among others were discussed. The patient verbalizes understanding and wishes to proceed with the plan.  Patient is being admitted for inpatient treatment for surgery, pain control, PT, prophylactic antibiotics, VTE prophylaxis, progressive ambulation, ADL's and discharge planning.   Dental prophylaxis discussed and recommended for 2 years postoperatively.  The patient does meet the criteria for TXA which will be used perioperatively.   ASA 81 mg BID will be used postoperatively for DVT prophylaxis in addition to SCDs, and early ambulation. Plan for Tylenol, Gabapentin, oxycodone for pain.   Robaxin for muscle spasms.   Zofran for nausea and vomiting.  Omeprazole for gastric protection. Pharmacy- [facility] on [address] The patient is planning to be discharged home with OPPT and in to the care of her husband Herbie [place] who can be reached at [phone]. Follow up appointment 06/17/21 at [facility] [place] Office [phone] 05/18/2021 3:57 PM

## 2021-05-18 NOTE — Care Plan (Signed)
Ortho Bundle Case Management Note  Patient Details  Name: Erin [name] MRN: [id number] Date of Birth: 01/14/1935  Met with patient prior to surgery. She will discharge to home with family to assist. Rolling walker and CPM ordered for home. OPPT set up with [facility]. Patient and MD in agreement with plan. Choice offered                     DME Arranged:  Walker rolling, CPM DME Agency:  [facility]  HH Arranged:    [place] Agency:     Additional Comments: Please contact me with any questions of if this plan should need to change.  Ladell Heads,  [place] [facility]  [phone] 05/18/2021, 4:01 PM

## 2021-05-21 NOTE — Patient Instructions (Addendum)
DUE TO COVID-19 ONLY ONE VISITOR IS ALLOWED TO COME WITH YOU AND STAY IN THE WAITING ROOM ONLY DURING PRE OP AND PROCEDURE DAY OF SURGERY. THE 2 VISITORS  MAY VISIT WITH YOU AFTER SURGERY IN YOUR PRIVATE ROOM DURING VISITING HOURS ONLY!  YOU NEED TO HAVE A COVID 19 TEST ON__8/5_____ '@__[address]_, THIS TEST MUST BE DONE BEFORE SURGERY,                 Melora LAKIYA MACCINI     Your procedure is scheduled on: 06/02/21   Report to [facility] Main  Entrance   Report to admitting at  7:35 AM     Call this number if you have problems the morning of surgery [place], NO [place].   No food after midnight.    You may have clear liquid until 7:00 AM.    At 6:30 AM drink pre surgery drink.   Nothing by mouth after 7:00 AM.    Take these medicines the morning of surgery with A SIP OF WATER: Carvedilol                                You may not have any metal on your body including hair pins and              piercings  Do not wear jewelry, make-up, lotions, powders or perfumes, deodorant             Do not wear nail polish on your fingernails.  Do not shave  48 hours prior to surgery.          .   Do not bring valuables to the hospital. [place].  Contacts, dentures or bridgework may not be worn into surgery.        Special Instructions: N/A              Please read over the following fact sheets you were given: _____________________________________________________________________             [facility] - Preparing for Surgery Before surgery, you can play an important role.  Because skin is not sterile, your skin needs to be as free of germs as possible.  You can reduce the number of germs on your skin by washing with CHG (chlorahexidine gluconate) soap before surgery.  CHG is an antiseptic cleaner which kills germs and bonds with  the skin to continue killing germs even after washing. Please DO NOT use if you have an allergy to CHG or antibacterial soaps.  If your skin becomes reddened/irritated stop using the CHG and inform your nurse when you arrive at Short Stay. Do not shave (including legs and underarms) for at least 48 hours prior to the first CHG shower.   Please follow these instructions carefully:  1.  Shower with CHG Soap the night before surgery and the  morning of Surgery.  2.  If you choose to wash your hair, wash your hair first as usual with your  normal  shampoo.  3.  After you shampoo, rinse your hair and body thoroughly to remove the  shampoo.  4.  Use CHG as you would any other liquid soap.  You can apply chg directly  to the skin and wash                       Gently with a scrungie or clean washcloth.  5.  Apply the CHG Soap to your body ONLY FROM THE NECK DOWN.   Do not use on face/ open                           Wound or open sores. Avoid contact with eyes, ears mouth and genitals (private parts).                       Wash face,  Genitals (private parts) with your normal soap.             6.  Wash thoroughly, paying special attention to the area where your surgery  will be performed.  7.  Thoroughly rinse your body with warm water from the neck down.  8.  DO NOT shower/wash with your normal soap after using and rinsing off  the CHG Soap.             9.  Pat yourself dry with a clean towel.            10.  Wear clean pajamas.            11.  Place clean sheets on your bed the night of your first shower and do not  sleep with pets. Day of Surgery : Do not apply any lotions/deodorants the morning of surgery.  Please wear clean clothes to the hospital/surgery center.  FAILURE TO FOLLOW THESE INSTRUCTIONS MAY RESULT IN THE CANCELLATION OF YOUR SURGERY PATIENT SIGNATURE_________________________________  NURSE  SIGNATURE__________________________________  ________________________________________________________________________   Adam Phenix  An incentive spirometer is a tool that can help keep your lungs clear and active. This tool measures how well you are filling your lungs with each breath. Taking long deep breaths may help reverse or decrease the chance of developing breathing (pulmonary) problems (especially infection) following: A long period of time when you are unable to move or be active. BEFORE THE PROCEDURE  If the spirometer includes an indicator to show your best effort, your nurse or respiratory therapist will set it to a desired goal. If possible, sit up straight or lean slightly forward. Try not to slouch. Hold the incentive spirometer in an upright position. INSTRUCTIONS FOR USE  Sit on the edge of your bed if possible, or sit up as far as you can in bed or on a chair. Hold the incentive spirometer in an upright position. Breathe out normally. Place the mouthpiece in your mouth and seal your lips tightly around it. Breathe in slowly and as deeply as possible, raising the piston or the ball toward the top of the column. Hold your breath for 3-5 seconds or for as long as possible. Allow the piston or ball to fall to the bottom of the column. Remove the mouthpiece from your mouth and breathe out normally. Rest for a few seconds and repeat Steps 1 through 7 at least 10 times every 1-2 hours when you are awake. Take your time and take a few normal breaths between deep breaths. The spirometer may include an indicator to show your best effort. Use the indicator as a goal to work toward during each repetition. After  each set of 10 deep breaths, practice coughing to be sure your lungs are clear. If you have an incision (the cut made at the time of surgery), support your incision when coughing by placing a pillow or rolled up towels firmly against it. Once you are able to get out of  bed, walk around indoors and cough well. You may stop using the incentive spirometer when instructed by your caregiver.  RISKS AND COMPLICATIONS Take your time so you do not get dizzy or light-headed. If you are in pain, you may need to take or ask for pain medication before doing incentive spirometry. It is harder to take a deep breath if you are having pain. AFTER USE Rest and breathe slowly and easily. It can be helpful to keep track of a log of your progress. Your caregiver can provide you with a simple table to help with this. If you are using the spirometer at home, follow these instructions: [place] IF:  You are having difficultly using the spirometer. You have trouble using the spirometer as often as instructed. Your pain medication is not giving enough relief while using the spirometer. You develop fever of 100.5 F (38.1 C) or higher. SEEK IMMEDIATE MEDICAL CARE IF:  You cough up bloody sputum that had not been present before. You develop fever of 102 F (38.9 C) or greater. You develop worsening pain at or near the incision site. MAKE SURE YOU:  Understand these instructions. Will watch your condition. Will get help right away if you are not doing well or get worse. Document Released: 02/21/2007 Document Revised: 01/03/2012 Document Reviewed: 04/24/2007 [employer] Patient Information 2014 [place], [place].   ________________________________________________________________________

## 2021-05-22 ENCOUNTER — Other Ambulatory Visit: Payer: Self-pay

## 2021-05-22 ENCOUNTER — Encounter (HOSPITAL_COMMUNITY): Payer: Self-pay

## 2021-05-22 ENCOUNTER — Encounter (HOSPITAL_COMMUNITY)
Admission: RE | Admit: 2021-05-22 | Discharge: 2021-05-22 | Disposition: A | Discharge status: Home / Self Care | Payer: Medicare Other | Source: Ambulatory Visit | Attending: Orthopedic Surgery | Admitting: Orthopedic Surgery

## 2021-05-22 DIAGNOSIS — I10 Essential (primary) hypertension: Secondary | ICD-10-CM | POA: Diagnosis not present

## 2021-05-22 DIAGNOSIS — Z79899 Other long term (current) drug therapy: Secondary | ICD-10-CM | POA: Insufficient documentation

## 2021-05-22 DIAGNOSIS — Z01812 Encounter for preprocedural laboratory examination: Secondary | ICD-10-CM | POA: Diagnosis not present

## 2021-05-22 DIAGNOSIS — M1712 Unilateral primary osteoarthritis, left knee: Secondary | ICD-10-CM | POA: Insufficient documentation

## 2021-05-22 HISTORY — DX: Personal history of urinary calculi: Z87.442

## 2021-05-22 LAB — CBC
HCT: 43.5 % (ref 36.0–46.0)
Hemoglobin: 14 g/dL (ref 12.0–15.0)
MCH: 29.5 pg (ref 26.0–34.0)
MCHC: 32.2 g/dL (ref 30.0–36.0)
MCV: 91.6 fL (ref 80.0–100.0)
Platelets: 240 10*3/uL (ref 150–400)
RBC: 4.75 MIL/uL (ref 3.87–5.11)
RDW: 14.7 % (ref 11.5–15.5)
WBC: 8.3 10*3/uL (ref 4.0–10.5)
nRBC: 0 % (ref 0.0–0.2)

## 2021-05-22 LAB — BASIC METABOLIC PANEL
Anion gap: 6 (ref 5–15)
BUN: 28 mg/dL — ABNORMAL HIGH (ref 8–23)
CO2: 31 mmol/L (ref 22–32)
Calcium: 9.2 mg/dL (ref 8.9–10.3)
Chloride: 104 mmol/L (ref 98–111)
Creatinine, Ser: 0.66 mg/dL (ref 0.44–1.00)
GFR, Estimated: >60 mL/min (ref >60)
Glucose, Bld: 109 mg/dL — ABNORMAL HIGH (ref 70–99)
Potassium: 4.1 mmol/L (ref 3.5–5.1)
Sodium: 141 mmol/L (ref 135–145)

## 2021-05-22 LAB — SURGICAL PCR SCREEN
MRSA, PCR: NEGATIVE
Staphylococcus aureus: POSITIVE — AB

## 2021-05-22 NOTE — Progress Notes (Signed)
COVID Vaccine Completed:no Date COVID Vaccine completed: COVID vaccine manufacturer: [employer]   PCP - Dr. Mamie Nick. McGowen LOV 01/14/21 ,clearance 04/22/21-epic Cardiologist - Dr. Linard Millers LOV 03/03/21 ,clearance 12/26/20-epic  Chest x-ray - 11/04/20 EKG - 11/07/20-epic Stress Test - no ECHO - 12/03/20-epic Cardiac Cath - 09/16/20, 11/06/20-epic Pacemaker/ICD device last checked:NA  Sleep Study - no CPAP -   Fasting Blood Sugar - NA Checks Blood Sugar _____ times a day  Blood Thinner Instructions:NA Aspirin Instructions: Last Dose:  Anesthesia review: yes  Patient denies shortness of breath, fever, cough and chest pain at PAT appointment Pt had a MitraClip placed 11/06/20. She sometimes feels breathless when she lays down to sleep  but not with any other activities.  Patient verbalized understanding of instructions that were given to them at the PAT appointment. Patient was also instructed that they will need to review over the PAT instructions again at home before surgery. yes

## 2021-05-26 NOTE — Anesthesia Preprocedure Evaluation (Addendum)
Anesthesia Evaluation  Patient identified by MRN, date of birth, ID band Patient awake    Reviewed: Allergy & Precautions, NPO status , Patient's Chart, lab work & pertinent test results  Airway Mallampati: II  TM Distance: >3 FB Neck ROM: Full    Dental no notable dental hx.    Pulmonary neg pulmonary ROS,    Pulmonary exam normal breath sounds clear to auscultation       Cardiovascular hypertension, Pt. on medications Normal cardiovascular exam+ Valvular Problems/Murmurs (s/p Mitral valve repair)  Rhythm:Regular Rate:Normal + Diastolic murmurs EKG: 12/06/9415 Rate 71 bpm  Sinus rhythm with occasional Premature ventricular complexes Otherwise normal ECG No significant change since last tracing  CV: Echo 12/03/2020 1. Left ventricular ejection fraction, by estimation, is 60 to 65%. The  left ventricle has normal function. The left ventricle has no regional  wall motion abnormalities. Left ventricular diastolic function could not  be evaluated.  2. Right ventricular systolic function is normal. The right ventricular  size is normal. There is moderately elevated pulmonary artery systolic  pressure. The estimated right ventricular systolic pressure is 40.8 mmHg.  3. Left atrial size was severely dilated.  4. Residual iatrogenic ASD from trans-septal puncture is seen. There is a  small secundum atrial septal defect with exclusively left to right  shunting across the atrial septum.  5. Residual mitral insufficiency originates primarily lateral to the  Mitraclip, which is located A2-P2. The jet is oriented anteriorly and  towards the septum. The mitral valve has been repaired/replaced. Mild to  moderate mitral valve regurgitation. Mild  mitral stenosis. The mean mitral valve gradient is 4.0 mmHg with average  heart rate of 66 bpm.  6. Tricuspid valve regurgitation is moderate to severe.  7. The aortic valve is tricuspid.  Aortic valve regurgitation is trivial.  Mild aortic valve sclerosis is present, with no evidence of aortic valve  stenosis.  8. The inferior vena cava is normal in size with <50% respiratory  variability, suggesting right atrial pressure of 8 mmHg.  9. Right atrial size was mildly dilated.    Neuro/Psych negative neurological ROS     GI/Hepatic negative GI ROS, Neg liver ROS,   Endo/Other  negative endocrine ROS  Renal/GU negative Renal ROS     Musculoskeletal  (+) Arthritis ,   Abdominal Normal abdominal exam  (+)   Peds  Hematology negative hematology ROS (+)   Anesthesia Other Findings   Reproductive/Obstetrics                           Anesthesia Physical Anesthesia Plan  ASA: 3  Anesthesia Plan: Spinal, Regional and MAC   Post-op Pain Management:    Induction: Intravenous  PONV Risk Score and Plan: 2 and Propofol infusion, TIVA, Treatment may vary due to age or medical condition and Ondansetron  Airway Management Planned: Natural Airway and Simple Face Mask  Additional Equipment:   Intra-op Plan:   Post-operative Plan:   Informed Consent: I have reviewed the patients History and Physical, chart, labs and discussed the procedure including the risks, benefits and alternatives for the proposed anesthesia with the patient or authorized representative who has indicated his/her understanding and acceptance.     Dental advisory given  Plan Discussed with: CRNA and Anesthesiologist  Anesthesia Plan Comments: (Adductor canal block for pain control. Isobaric spinal. Propofol gtt. Phenylephrine gtt. Available. GA/LMA as backup plan. Norton Blizzard, MD   Patient has no recollection of any  procaine allergy. Will remove from her allergy list. Norton Blizzard, MD  )      Anesthesia Quick Evaluation

## 2021-05-26 NOTE — Progress Notes (Signed)
Anesthesia Chart Review   Case: [id number] Date/Time: 06/02/21 0949   Procedure: TOTAL KNEE ARTHROPLASTY (Left: Knee)   Anesthesia type: Choice   Pre-op diagnosis: OA LEFT KNEE   Location: [facility] ROOM 08 / [facility]   Surgeons: Renette Butters, MD       DISCUSSION:86 y.o. never smoker with h/o HTN, s/p mitral valve clip 11/06/2020, left knee OA scheduled for above procedure 06/02/2021 with Dr. Edmonia Lynch.   Pt last seen by cardiology 03/03/2021.  Stable at this visit, residual mitral regurgitation is mild to moderate, no evidence of heart failure. Per OV note, "She is cleared for upcoming left knee replacement by Dr. Edmonia Lynch." VS: BP (!) 136/59   Pulse (!) 51   Temp 36.8 C (Oral)   Resp 18   Ht '5\' 2"'$  (1.575 m)   Wt 58.1 kg   SpO2 99%   BMI 23.41 kg/m   PROVIDERS: McGowen, Adrian Blackwater, MD is PCP   Daneen Schick, MD is Cardiologist  LABS: Labs reviewed: Acceptable for surgery. (all labs ordered are listed, but only abnormal results are displayed)  Labs Reviewed  SURGICAL PCR SCREEN - Abnormal; Notable for the following components:      Result Value   Staphylococcus aureus POSITIVE (*)    All other components within normal limits  BASIC METABOLIC PANEL - Abnormal; Notable for the following components:   Glucose, Bld 109 (*)    BUN 28 (*)    All other components within normal limits  CBC     IMAGES:   EKG: 11/07/2020 Rate 71 bpm  Sinus rhythm with occasional Premature ventricular complexes Otherwise normal ECG No significant change since last tracing  CV: Echo 12/03/2020  1. Left ventricular ejection fraction, by estimation, is 60 to 65%. The  left ventricle has normal function. The left ventricle has no regional  wall motion abnormalities. Left ventricular diastolic function could not  be evaluated.   2. Right ventricular systolic function is normal. The right ventricular  size is normal. There is moderately elevated pulmonary artery systolic  pressure. The  estimated right ventricular systolic pressure is 123XX123 mmHg.   3. Left atrial size was severely dilated.   4. Residual iatrogenic ASD from trans-septal puncture is seen. There is a  small secundum atrial septal defect with exclusively left to right  shunting across the atrial septum.   5. Residual mitral insufficiency originates primarily lateral to the  Mitraclip, which is located A2-P2. The jet is oriented anteriorly and  towards the septum. The mitral valve has been repaired/replaced. Mild to  moderate mitral valve regurgitation. Mild   mitral stenosis. The mean mitral valve gradient is 4.0 mmHg with average  heart rate of 66 bpm.   6. Tricuspid valve regurgitation is moderate to severe.   7. The aortic valve is tricuspid. Aortic valve regurgitation is trivial.  Mild aortic valve sclerosis is present, with no evidence of aortic valve  stenosis.   8. The inferior vena cava is normal in size with <50% respiratory  variability, suggesting right atrial pressure of 8 mmHg.   9. Right atrial size was mildly dilated.  Past Medical History:  Diagnosis Date   Achilles tendon rupture    left, no surgery required   Gross hematuria 12/2020   Klebsiella on urine clx x2 but no UTI sx's.  CT showed tiny stones in each renal pelvis but no ureteral or bladder stones, no mass.  Urol did cysto->normal. Plan is annual UA, rpt hematuria w/u  in 3-5 yrs if persistent pos.   History of kidney stones    Hypertension    Hypertensive retinopathy of both eyes    Dr. Herbert Deaner   Low back pain    spondylosis, listhesis, +scoliosis at L/S jxn   Lower extremity edema    lasix prn   OA (osteoarthritis)    Osteopenia    S/P mitral valve clip implantation 11/06/2020   s/p TEER with one XTW MitraClip on A2P2 by Dr. Burt Knack.  DAPT w/ASA and plavix x 3 mo post procedure recommended   Severe mitral regurgitation    2021 transthoracic echo and TEE->cardiology following.  Mitral valve clip 10/2020.  f/u echo 10/28/20  mild/mod MVR and tricusp regurg   Severe pulmonary hypertension ([place]) 2021   transth echo; moderate by TEE [id number]   Systolic murmur 0000000   severe mitral regurge, mod/severe tricuspic regurg    Past Surgical History:  Procedure Laterality Date   APPENDECTOMY     86 yrs old   [place]  2004   ruptured disc   CHOLECYSTECTOMY     age 81   LUMBAR LAMINECTOMY  2004   MITRAL VALVE REPAIR N/A 11/06/2020   Procedure: MITRAL VALVE REPAIR;  Surgeon: Sherren Mocha, MD;  Location: [place] [facility];  Service: Cardiovascular;  Laterality: N/A;   RIGHT/LEFT HEART CATH AND CORONARY ANGIOGRAPHY N/A 09/16/2020   No CAD. Procedure: RIGHT/LEFT HEART CATH AND CORONARY ANGIOGRAPHY;  Surgeon: Belva Crome, MD;  Location: [place] [facility];  Service: Cardiovascular;  Laterality: N/A;   TEE WITHOUT CARDIOVERSION N/A 09/16/2020   Procedure: TRANSESOPHAGEAL ECHOCARDIOGRAM (TEE);  Surgeon: Buford Dresser, MD;  Location[facility];  Service: Cardiovascular;  Laterality: N/A;   TEE WITHOUT CARDIOVERSION N/A 11/06/2020   Procedure: TRANSESOPHAGEAL ECHOCARDIOGRAM (TEE);  Surgeon: Sherren Mocha, MD;  Location: [place] [facility];  Service: Cardiovascular;  Laterality: N/A;   TONSILLECTOMY     age 65   TRANSTHORACIC ECHOCARDIOGRAM  07/03/2020; 10/28/20; 12/03/20   06/2020 TTE EF 60-65%, grd II DD, severe pulm art HTN, severe mitral valve regurg, mod/sev tricuspic valve regurg. Conf on TEE 08/2020.  10/28/20 (s/p MV clip procedure) mild-mod MVR, EF 55-60%. 11/2020 EF 60%, mild/mod MR w/mean grad 46m hg and mod to sev TR.    MEDICATIONS:  amoxicillin (AMOXIL) 500 MG tablet   Ascorbic Acid (VITAMIN C PO)   carvedilol (COREG) 25 MG tablet   cholecalciferol (VITAMIN D) 25 MCG (1000 UNIT) tablet   diazepam (VALIUM) 2 MG tablet   furosemide (LASIX) 20 MG tablet   loratadine (CLARITIN) 10 MG tablet   Multiple Vitamin (MULTIVITAMIN WITH MINERALS) TABS tablet   Multiple Vitamins-Minerals  (PRESERVISION AREDS PO)   Multiple Vitamins-Minerals (ZINC PO)   nitroGLYCERIN (NITRODUR - DOSED IN MG/24 HR) 0.2 mg/hr patch   potassium chloride (KLOR-CON) 10 MEQ tablet   sodium chloride (MURO 128) 5 % ophthalmic solution   vitamin E 180 MG (400 UNITS) capsule   No current facility-administered medications for this encounter.   JKonrad Felix PA-C [facility] Pre-Surgical Testing [phone]

## 2021-05-29 ENCOUNTER — Other Ambulatory Visit: Payer: Self-pay | Admitting: Orthopedic Surgery

## 2021-05-30 LAB — SARS CORONAVIRUS 2 (TAT 6-24 HRS): SARS Coronavirus 2: NEGATIVE

## 2021-06-02 ENCOUNTER — Encounter (HOSPITAL_COMMUNITY): Payer: Self-pay | Admitting: Orthopedic Surgery

## 2021-06-02 ENCOUNTER — Encounter (HOSPITAL_COMMUNITY)
Admission: RE | Disposition: A | Discharge status: Home / Self Care | Payer: Self-pay | Source: Home / Self Care | Attending: Orthopedic Surgery

## 2021-06-02 ENCOUNTER — Ambulatory Visit (HOSPITAL_COMMUNITY): Payer: Medicare Other | Admitting: Physician Assistant

## 2021-06-02 ENCOUNTER — Ambulatory Visit (HOSPITAL_COMMUNITY): Payer: Medicare Other | Admitting: Certified Registered Nurse Anesthetist

## 2021-06-02 ENCOUNTER — Ambulatory Visit (HOSPITAL_COMMUNITY)
Admission: RE | Admit: 2021-06-02 | Discharge: 2021-06-02 | Disposition: A | Discharge status: Home / Self Care | Payer: Medicare Other | Attending: Orthopedic Surgery | Admitting: Orthopedic Surgery

## 2021-06-02 DIAGNOSIS — Z885 Allergy status to narcotic agent status: Secondary | ICD-10-CM | POA: Diagnosis not present

## 2021-06-02 DIAGNOSIS — Z79899 Other long term (current) drug therapy: Secondary | ICD-10-CM | POA: Insufficient documentation

## 2021-06-02 DIAGNOSIS — I10 Essential (primary) hypertension: Secondary | ICD-10-CM | POA: Diagnosis not present

## 2021-06-02 DIAGNOSIS — M1712 Unilateral primary osteoarthritis, left knee: Secondary | ICD-10-CM | POA: Diagnosis not present

## 2021-06-02 DIAGNOSIS — Z888 Allergy status to other drugs, medicaments and biological substances status: Secondary | ICD-10-CM | POA: Insufficient documentation

## 2021-06-02 DIAGNOSIS — G8918 Other acute postprocedural pain: Secondary | ICD-10-CM | POA: Diagnosis not present

## 2021-06-02 DIAGNOSIS — N3090 Cystitis, unspecified without hematuria: Secondary | ICD-10-CM | POA: Diagnosis not present

## 2021-06-02 HISTORY — PX: TOTAL KNEE ARTHROPLASTY: SHX125

## 2021-06-02 SURGERY — ARTHROPLASTY, KNEE, TOTAL
Anesthesia: Monitor Anesthesia Care | Site: Knee | Laterality: Left

## 2021-06-02 MED ORDER — PROPOFOL 1000 MG/100ML IV EMUL
INTRAVENOUS | Status: AC
  Filled 2021-06-02: qty 100

## 2021-06-02 MED ORDER — ONDANSETRON HCL 4 MG/2ML IJ SOLN
INTRAMUSCULAR | Status: DC | PRN
  Administered 2021-06-02: 4 mg via INTRAVENOUS

## 2021-06-02 MED ORDER — PHENYLEPHRINE HCL-NACL 20-0.9 MG/250ML-% IV SOLN
INTRAVENOUS | Status: DC | PRN
  Administered 2021-06-02: 25 ug/min via INTRAVENOUS

## 2021-06-02 MED ORDER — GABAPENTIN 300 MG PO CAPS: ORAL_CAPSULE | ORAL | 0 refills | Status: DC

## 2021-06-02 MED ORDER — TRANEXAMIC ACID-NACL 1000-0.7 MG/100ML-% IV SOLN
INTRAVENOUS | Status: DC | PRN
  Administered 2021-06-02: 1000 mg via INTRAVENOUS

## 2021-06-02 MED ORDER — ACETAMINOPHEN 500 MG PO TABS
1000.0000 mg | ORAL_TABLET | Freq: Once | ORAL | Status: AC
  Administered 2021-06-02: 1000 mg via ORAL
  Filled 2021-06-02: qty 2

## 2021-06-02 MED ORDER — POLYETHYLENE GLYCOL 3350 17 G PO PACK: 17.0000 g | PACK | Freq: Two times a day (BID) | ORAL | 0 refills | Status: DC

## 2021-06-02 MED ORDER — LACTATED RINGERS IV BOLUS
250.0000 mL | Freq: Once | INTRAVENOUS | Status: AC
Start: 2021-06-02 — End: 2021-06-02
  Administered 2021-06-02: 250 mL via INTRAVENOUS

## 2021-06-02 MED ORDER — POVIDONE-IODINE 7.5 % EX SOLN: Freq: Once | CUTANEOUS | Status: DC

## 2021-06-02 MED ORDER — AMISULPRIDE (ANTIEMETIC) 5 MG/2ML IV SOLN: 10.0000 mg | Freq: Once | INTRAVENOUS | Status: DC | PRN

## 2021-06-02 MED ORDER — CHLORHEXIDINE GLUCONATE 0.12 % MT SOLN
15.0000 mL | Freq: Once | OROMUCOSAL | Status: AC
  Administered 2021-06-02: 15 mL via OROMUCOSAL

## 2021-06-02 MED ORDER — OXYCODONE HCL 5 MG PO TABS: 5.0000 mg | ORAL_TABLET | Freq: Once | ORAL | Status: DC | PRN

## 2021-06-02 MED ORDER — DIAZEPAM 2 MG PO TABS
2.0000 mg | ORAL_TABLET | Freq: Once | ORAL | Status: AC
  Administered 2021-06-02: 2 mg via ORAL

## 2021-06-02 MED ORDER — POVIDONE-IODINE 10 % EX SWAB
2.0000 "application " | Freq: Once | CUTANEOUS | Status: AC
  Administered 2021-06-02: 2 via TOPICAL

## 2021-06-02 MED ORDER — ORAL CARE MOUTH RINSE: 15.0000 mL | Freq: Once | OROMUCOSAL | Status: AC

## 2021-06-02 MED ORDER — ASPIRIN EC 81 MG PO TBEC: DELAYED_RELEASE_TABLET | ORAL | 0 refills | Status: DC

## 2021-06-02 MED ORDER — BUPIVACAINE LIPOSOME 1.3 % IJ SUSP
20.0000 mL | Freq: Once | INTRAMUSCULAR | Status: AC
  Administered 2021-06-02: 20 mL
  Filled 2021-06-02: qty 20

## 2021-06-02 MED ORDER — TRANEXAMIC ACID-NACL 1000-0.7 MG/100ML-% IV SOLN
INTRAVENOUS | Status: AC
  Filled 2021-06-02: qty 100

## 2021-06-02 MED ORDER — DOCUSATE SODIUM 100 MG PO CAPS: ORAL_CAPSULE | ORAL | 2 refills | Status: DC

## 2021-06-02 MED ORDER — 0.9 % SODIUM CHLORIDE (POUR BTL) OPTIME
TOPICAL | Status: DC | PRN
  Administered 2021-06-02: 1000 mL

## 2021-06-02 MED ORDER — DIAZEPAM 2 MG PO TABS
ORAL_TABLET | ORAL | Status: AC
  Filled 2021-06-02: qty 1

## 2021-06-02 MED ORDER — ONDANSETRON HCL 4 MG PO TABS: 4.0000 mg | ORAL_TABLET | Freq: Three times a day (TID) | ORAL | 0 refills | Status: DC | PRN

## 2021-06-02 MED ORDER — OXYCODONE HCL 5 MG/5ML PO SOLN: 5.0000 mg | Freq: Once | ORAL | Status: DC | PRN

## 2021-06-02 MED ORDER — ONDANSETRON HCL 4 MG/2ML IJ SOLN: 4.0000 mg | Freq: Once | INTRAMUSCULAR | Status: DC | PRN

## 2021-06-02 MED ORDER — ACETAMINOPHEN 500 MG PO TABS: 1000.0000 mg | ORAL_TABLET | Freq: Once | ORAL | Status: DC

## 2021-06-02 MED ORDER — OXYCODONE HCL 5 MG PO TABS: 5.0000 mg | ORAL_TABLET | ORAL | 0 refills | Status: AC | PRN

## 2021-06-02 MED ORDER — SODIUM CHLORIDE 0.9 % IR SOLN
Status: DC | PRN
  Administered 2021-06-02: 1000 mL

## 2021-06-02 MED ORDER — PROPOFOL 500 MG/50ML IV EMUL
INTRAVENOUS | Status: DC | PRN
  Administered 2021-06-02: 65 ug/kg/min via INTRAVENOUS

## 2021-06-02 MED ORDER — LACTATED RINGERS IV BOLUS
500.0000 mL | Freq: Once | INTRAVENOUS | Status: AC
Start: 2021-06-02 — End: 2021-06-02
  Administered 2021-06-02: 500 mL via INTRAVENOUS

## 2021-06-02 MED ORDER — DEXAMETHASONE SODIUM PHOSPHATE 10 MG/ML IJ SOLN
INTRAMUSCULAR | Status: AC
  Filled 2021-06-02: qty 1

## 2021-06-02 MED ORDER — SODIUM CHLORIDE 0.9% FLUSH
INTRAVENOUS | Status: DC | PRN
  Administered 2021-06-02: 30 mL

## 2021-06-02 MED ORDER — DEXAMETHASONE SODIUM PHOSPHATE 10 MG/ML IJ SOLN
INTRAMUSCULAR | Status: DC | PRN
  Administered 2021-06-02: 4 mg via INTRAVENOUS

## 2021-06-02 MED ORDER — LACTATED RINGERS IV SOLN: INTRAVENOUS | Status: DC

## 2021-06-02 MED ORDER — FENTANYL CITRATE (PF) 100 MCG/2ML IJ SOLN
50.0000 ug | Freq: Once | INTRAMUSCULAR | Status: AC
  Administered 2021-06-02: 50 ug via INTRAVENOUS
  Filled 2021-06-02: qty 2

## 2021-06-02 MED ORDER — BUPIVACAINE HCL (PF) 0.5 % IJ SOLN
INTRAMUSCULAR | Status: DC | PRN
  Administered 2021-06-02: 12.5 mg via INTRATHECAL

## 2021-06-02 MED ORDER — STERILE WATER FOR IRRIGATION IR SOLN
Status: DC | PRN
  Administered 2021-06-02 (×2): 1000 mL

## 2021-06-02 MED ORDER — POVIDONE-IODINE 10 % EX SWAB: 2.0000 "application " | Freq: Once | CUTANEOUS | Status: DC

## 2021-06-02 MED ORDER — BUPIVACAINE HCL (PF) 0.5 % IJ SOLN
INTRAMUSCULAR | Status: DC | PRN
  Administered 2021-06-02: 20 mL

## 2021-06-02 MED ORDER — ONDANSETRON HCL 4 MG/2ML IJ SOLN
INTRAMUSCULAR | Status: AC
  Filled 2021-06-02: qty 2

## 2021-06-02 MED ORDER — BUPIVACAINE HCL (PF) 0.5 % IJ SOLN
INTRAMUSCULAR | Status: AC
  Filled 2021-06-02: qty 60

## 2021-06-02 MED ORDER — FENTANYL CITRATE (PF) 100 MCG/2ML IJ SOLN: 25.0000 ug | INTRAMUSCULAR | Status: DC | PRN

## 2021-06-02 MED ORDER — BUPIVACAINE-EPINEPHRINE 0.25% -1:200000 IJ SOLN
INTRAMUSCULAR | Status: DC | PRN
  Administered 2021-06-02: 30 mL

## 2021-06-02 MED ORDER — PROPOFOL 10 MG/ML IV BOLUS
INTRAVENOUS | Status: DC | PRN
  Administered 2021-06-02: 10 mg via INTRAVENOUS

## 2021-06-02 MED ORDER — PHENYLEPHRINE 40 MCG/ML (10ML) SYRINGE FOR IV PUSH (FOR BLOOD PRESSURE SUPPORT)
PREFILLED_SYRINGE | INTRAVENOUS | Status: DC | PRN
  Administered 2021-06-02: 80 ug via INTRAVENOUS

## 2021-06-02 MED ORDER — CEFAZOLIN SODIUM-DEXTROSE 2-4 GM/100ML-% IV SOLN
2.0000 g | INTRAVENOUS | Status: AC
  Administered 2021-06-02: 2 g via INTRAVENOUS
  Filled 2021-06-02: qty 100

## 2021-06-02 SURGICAL SUPPLY — 53 items
BAG COUNTER SPONGE SURGICOUNT (BAG) IMPLANT
BAG SPNG CNTER NS LX DISP (BAG)
BLADE HEX COATED 2.75 (ELECTRODE) ×2 IMPLANT
BLADE SAG 18X100X1.27 (BLADE) ×2 IMPLANT
BLADE SAGITTAL 25.0X1.37X90 (BLADE) ×2 IMPLANT
BLADE SURG 15 STRL LF DISP TIS (BLADE) ×1 IMPLANT
BLADE SURG 15 STRL SS (BLADE) ×2
BLADE SURG SZ10 CARB STEEL (BLADE) ×4 IMPLANT
BNDG CMPR MED 10X6 ELC LF (GAUZE/BANDAGES/DRESSINGS) ×1
BNDG ELASTIC 6X10 VLCR STRL LF (GAUZE/BANDAGES/DRESSINGS) ×2 IMPLANT
BOWL SMART MIX CTS (DISPOSABLE) IMPLANT
BSPLAT TIB 4 KN TRITANIUM (Knees) ×1 IMPLANT
CLSR STERI-STRIP ANTIMIC 1/2X4 (GAUZE/BANDAGES/DRESSINGS) ×2 IMPLANT
COMP FEMORAL TRIATHLON SZ3 (Joint) ×2 IMPLANT
COMPONENT FEMRL TRIATHLON SZ3 (Joint) IMPLANT
COVER SURGICAL LIGHT HANDLE (MISCELLANEOUS) ×2 IMPLANT
CUFF TOURN SGL QUICK 34 (TOURNIQUET CUFF) ×2
CUFF TRNQT CYL 34X4.125X (TOURNIQUET CUFF) ×1 IMPLANT
DECANTER SPIKE VIAL GLASS SM (MISCELLANEOUS) ×2 IMPLANT
DRAPE U-SHAPE 47X51 STRL (DRAPES) ×2 IMPLANT
DRSG AQUACEL AG ADV 3.5X10 (GAUZE/BANDAGES/DRESSINGS) ×1 IMPLANT
DRSG MEPILEX BORDER 4X12 (GAUZE/BANDAGES/DRESSINGS) ×2 IMPLANT
DURAPREP 26ML APPLICATOR (WOUND CARE) ×4 IMPLANT
GLOVE SRG 8 PF TXTR STRL LF DI (GLOVE) ×1 IMPLANT
GLOVE SURG ENC MOIS LTX SZ7.5 (GLOVE) ×2 IMPLANT
GLOVE SURG POLYISO LF SZ7.5 (GLOVE) ×2 IMPLANT
GLOVE SURG UNDER POLY LF SZ7.5 (GLOVE) ×2 IMPLANT
GLOVE SURG UNDER POLY LF SZ8 (GLOVE) ×2
GOWN STRL REUS W/TWL LRG LVL3 (GOWN DISPOSABLE) ×2 IMPLANT
GOWN STRL REUS W/TWL XL LVL3 (GOWN DISPOSABLE) ×2 IMPLANT
HANDPIECE INTERPULSE COAX TIP (DISPOSABLE) ×2
HOLDER FOLEY CATH W/STRAP (MISCELLANEOUS) IMPLANT
IMMOBILIZER KNEE 22 UNIV (SOFTGOODS) ×2 IMPLANT
INSERT TRIATH X3 SZ4 9 (Insert) ×2 IMPLANT
KIT TURNOVER KIT A (KITS) ×2 IMPLANT
KNEE PATELLA ASYMMETRIC 9X29 (Knees) ×2 IMPLANT
KNEE TIBIAL COMP TRI SZ4 (Knees) ×1 IMPLANT
MANIFOLD NEPTUNE II (INSTRUMENTS) ×2 IMPLANT
NS IRRIG 1000ML POUR BTL (IV SOLUTION) ×2 IMPLANT
PACK ICE MAXI GEL EZY WRAP (MISCELLANEOUS) ×2 IMPLANT
PACK TOTAL KNEE CUSTOM (KITS) ×2 IMPLANT
PENCIL SMOKE EVACUATOR (MISCELLANEOUS) IMPLANT
PIN FLUTED HEDLESS FIX 3.5X1/8 (PIN) ×1 IMPLANT
PROTECTOR NERVE ULNAR (MISCELLANEOUS) ×2 IMPLANT
SET HNDPC FAN SPRY TIP SCT (DISPOSABLE) ×1 IMPLANT
SUT MNCRL AB 3-0 PS2 18 (SUTURE) ×2 IMPLANT
SUT VIC AB 0 CT1 36 (SUTURE) ×2 IMPLANT
SUT VIC AB 1 CT1 36 (SUTURE) ×4 IMPLANT
SUT VIC AB 2-0 CT1 27 (SUTURE) ×2
SUT VIC AB 2-0 CT1 TAPERPNT 27 (SUTURE) ×1 IMPLANT
TRAY FOLEY MTR SLVR 14FR STAT (SET/KITS/TRAYS/PACK) IMPLANT
TRAY FOLEY MTR SLVR 16FR STAT (SET/KITS/TRAYS/PACK) IMPLANT
TUBE SUCTION HIGH CAP CLEAR NV (SUCTIONS) ×2 IMPLANT

## 2021-06-02 NOTE — Progress Notes (Signed)
Assisted Dr. Elgie Congo with left, ultrasound guided, adductor canal block. Side rails up, monitors on throughout procedure. See vital signs in flow sheet. Tolerated Procedure well.

## 2021-06-02 NOTE — Interval H&P Note (Signed)
History and Physical Interval Note:  06/02/2021 9:06 AM  Erin [name]  has presented today for surgery, with the diagnosis of OSTEOARTHRITIS LEFT KNEE.  The various methods of treatment have been discussed with the patient and family. After consideration of risks, benefits and other options for treatment, the patient has consented to  Procedure(s): LEFT TOTAL KNEE ARTHROPLASTY (Left) as a surgical intervention.  The patient's history has been reviewed, patient examined, no change in status, stable for surgery.  I have reviewed the patient's chart and labs.  Questions were answered to the patient's satisfaction.     Renette Butters

## 2021-06-02 NOTE — Transfer of Care (Signed)
Immediate Anesthesia Transfer of Care Note  Patient: Erin [name]  Procedure(s) Performed: LEFT TOTAL KNEE ARTHROPLASTY (Left: Knee)  Patient Location: PACU  Anesthesia Type:Spinal and MAC combined with regional for post-op pain  Level of Consciousness: awake, alert , oriented and patient cooperative  Airway & Oxygen Therapy: Patient Spontanous Breathing and Patient connected to nasal cannula oxygen  Post-op Assessment: Report given to RN and Post -op Vital signs reviewed and stable  Post vital signs: Reviewed and stable  Last Vitals:  Vitals Value Taken Time  BP 123/68 06/02/21 1130  Temp 36.2 C 06/02/21 1130  Pulse 67 06/02/21 1132  Resp 14 06/02/21 1132  SpO2 100 % 06/02/21 1132  Vitals shown include unvalidated device data.  Last Pain:  Vitals:   06/02/21 0759  TempSrc:   PainSc: 0-No pain         Complications: No notable events documented.

## 2021-06-02 NOTE — Anesthesia Procedure Notes (Signed)
Procedure Name: MAC Date/Time: 06/02/2021 10:00 AM Performed by: West Pugh, CRNA Pre-anesthesia Checklist: Patient identified, Emergency Drugs available, Suction available, Patient being monitored and Timeout performed Patient Re-evaluated:Patient Re-evaluated prior to induction Oxygen Delivery Method: Simple face mask Preoxygenation: Pre-oxygenation with 100% oxygen Induction Type: IV induction Placement Confirmation: positive ETCO2 Dental Injury: Teeth and Oropharynx as per pre-operative assessment

## 2021-06-02 NOTE — Evaluation (Signed)
Physical Therapy Evaluation Patient Details Name: Erin [name] MRN: [id number] DOB: 03/17/1935 Today's Date: 06/02/2021   History of Present Illness  Patient is 86 y.o. female s/p Lt TKA on 06/02/21 with PMH significant for OA, low back pain, osteopenia, pulm HTN, HTN, hypertensive retinopathy, back surgery.   Clinical Impression  Erin [name] is a 86 y.o. female POD 0 s/p Lt TKA. Patient reports independence with mobility at baseline. Patient is now limited by functional impairments (see PT problem list below) and requires min guard/supervision for transfers and gait with RW. Patient was able to ambulate ~110 feet with RW and min and cues for safe walker management. Patient's husband present at [place] and provided safe guarding during gait with cues and supervision from therapist. Patient instructed in exercises to facilitate ROM and circulation. Patient/spouse state she has a level entrance and no need to ascend/descend steps at home. Patient will benefit from continued skilled PT interventions to address impairments and progress towards PLOF. Patient has met mobility goals at adequate level for discharge home; will continue to follow if pt continues acute stay to progress towards Mod I goals.     Follow Up Recommendations Follow surgeon's recommendation for DC plan and follow-up therapies    Equipment Recommendations  None recommended by PT    Recommendations for Other Services       Precautions / Restrictions Precautions Precautions: Fall Restrictions Weight Bearing Restrictions: No Other Position/Activity Restrictions: WBAT      Mobility  Bed Mobility Overal bed mobility: Needs Assistance Bed Mobility: Supine to Sit     Supine to sit: Min guard;HOB elevated     General bed mobility comments: some extra time, pt able to bring LE's off EOB.    Transfers Overall transfer level: Needs assistance Equipment used: Rolling walker (2 wheeled) Transfers: Sit to/from [employer] Sit to Stand: Min guard;From elevated surface;Supervision Stand pivot transfers: Min guard       General transfer comment: cues for technique with RW to rise from stretcher. guarding for safety with sit<>stand from stretcher and BSC. supervision from recliner. bed>BSC transfer at start with guarding and cues for safety with walker position.  Ambulation/Gait Ambulation/Gait assistance: Min guard;Supervision Gait Distance (Feet): 110 Feet Assistive device: Rolling walker (2 wheeled) Gait Pattern/deviations: Step-to pattern;Decreased stride length;Decreased weight shift to left Gait velocity: decr   General Gait Details: cues for safe step pattern and proximity to RW. pt maintained throughout with no overt LOB and no buckling at Lt knee observed. patient's husband joined at [place] and provided safe guarding technique for gait with supervision and cues from therapist.  Stairs Stairs:  (pt reports will no do stairs and is staying in bedroom in basement with level entrance to enter.)          Wheelchair Mobility    Modified Rankin (Stroke Patients Only)       Balance Overall balance assessment: Mild deficits observed, not formally tested;Needs assistance Sitting-balance support: Feet supported Sitting balance-Leahy Scale: Good     Standing balance support: During functional activity;Bilateral upper extremity supported Standing balance-Leahy Scale: Fair                               Pertinent Vitals/Pain Pain Assessment: 0-10 Pain Score: 0-No pain Pain Location: Lt knee Pain Intervention(s): Monitored during session    Home Living Family/patient expects to be discharged to:: Private residence Living Arrangements: Spouse/significant other Available Help at  Discharge: Family Type of Home: House Home Access: Stairs to enter;Level entry Entrance Stairs-Rails: None Entrance Stairs-Number of Steps: level entrance at side or 3 steps up on porch with  no rails. Home Layout: Multi-level Home Equipment: Walker - 2 wheels;Cane - single point;Bedside commode;Shower seat Additional Comments: pt going to stay in bedroom in basement and not do any steps. pt has full bath with walk in shower down there.    Prior Function Level of Independence: Independent               Hand Dominance   Dominant Hand: Right    Extremity/Trunk Assessment   Upper Extremity Assessment Upper Extremity Assessment: Overall WFL for tasks assessed    Lower Extremity Assessment Lower Extremity Assessment: Overall WFL for tasks assessed;LLE deficits/detail LLE Deficits / Details: good quad activation, no extensor lag with SLR LLE Sensation: WNL LLE Coordination: WNL    Cervical / Trunk Assessment Cervical / Trunk Assessment: Normal  Communication   Communication: No difficulties  Cognition Arousal/Alertness: Awake/alert Behavior During Therapy: WFL for tasks assessed/performed Overall Cognitive Status: Within Functional Limits for tasks assessed                                        General Comments      Exercises Total Joint Exercises Ankle Circles/Pumps: AROM;Both;20 reps;Seated Quad Sets: AROM;Left;Seated;5 reps Short Arc Quad: AROM;Left;Other reps (comment);Seated Heel Slides: AROM;Left;Seated;5 reps Hip ABduction/ADduction: AROM;Left;Other reps (comment);Seated Straight Leg Raises: AROM;Left;Other reps (comment);Seated   Assessment/Plan    PT Assessment Patient needs continued PT services  PT Problem List Decreased strength;Decreased range of motion;Decreased activity tolerance;Decreased balance;Decreased mobility;Decreased knowledge of use of DME;Decreased knowledge of precautions;Pain       PT Treatment Interventions DME instruction;Gait training;Stair training;Functional mobility training;Therapeutic activities;Therapeutic exercise;Balance training;Patient/family education    PT Goals (Current goals can be found  in the Care Plan section)  Acute Rehab PT Goals Patient Stated Goal: recoever safely PT Goal Formulation: With patient/family Time For Goal Achievement: 06/09/21 Potential to Achieve Goals: Good    Frequency 7X/week   Barriers to discharge        Co-evaluation               AM-PAC PT "6 Clicks" Mobility  Outcome Measure Help needed turning from your back to your side while in a flat bed without using bedrails?: A Little Help needed moving from lying on your back to sitting on the side of a flat bed without using bedrails?: A Little Help needed moving to and from a bed to a chair (including a wheelchair)?: A Little Help needed standing up from a chair using your arms (e.g., wheelchair or bedside chair)?: A Little Help needed to walk in hospital room?: A Little Help needed climbing 3-5 steps with a railing? : A Little 6 Click Score: 18    End of Session Equipment Utilized During Treatment: Gait belt Activity Tolerance: Patient tolerated treatment well Patient left: in chair;with call bell/phone within reach;with family/visitor present Nurse Communication: Mobility status PT Visit Diagnosis: Muscle weakness (generalized) (M62.81);Difficulty in walking, not elsewhere classified (R26.2)    Time: 1610-9604 PT Time Calculation (min) (ACUTE ONLY): 46 min   Charges:   PT Evaluation $PT Eval Low Complexity: 1 Low PT Treatments $Gait Training: 8-22 mins $Therapeutic Exercise: 8-22 mins        Verner Mould, DPT Acute Rehabilitation Services Office [phone]  Pager [phone]   Jacques Navy 06/02/2021, 5:12 PM

## 2021-06-02 NOTE — Anesthesia Procedure Notes (Signed)
Spinal  Patient location during procedure: OR Start time: 06/02/2021 10:03 AM End time: 06/02/2021 10:10 AM Reason for block: surgical anesthesia Staffing Performed: resident/CRNA  Anesthesiologist: Merlinda Frederick, MD Resident/CRNA: West Pugh, CRNA Preanesthetic Checklist Completed: patient identified, IV checked, site marked, risks and benefits discussed, surgical consent, monitors and equipment checked, pre-op evaluation and timeout performed Spinal Block Patient position: sitting Prep: DuraPrep and site prepped and draped Patient monitoring: heart rate, continuous pulse ox and blood pressure Approach: midline Location: L3-4 Injection technique: single-shot Needle Needle type: Pencan and Introducer  Needle gauge: 24 G Needle length: 9 cm Assessment Sensory level: T4 Events: CSF return Additional Notes IV functioning, monitors applied to pt. Expiration date of kit checked and confirmed to be in date. Sterile prep and drape, hand hygiene and sterile gloved used. Pt was positioned and spine was prepped in sterile fashion. Skin was anesthetized with lidocaine. Free flow of clear CSF obtained prior to injecting local anesthetic into CSF. Spinal needle aspirated freely following injection. Needle was carefully withdrawn, and pt tolerated procedure well. Loss of motor and sensory on exam post injection. Dr Elgie Congo present for procedure.

## 2021-06-02 NOTE — Progress Notes (Signed)
Orthopedic Tech Progress Note Patient Details:  Erin [name] [birth year] [id number]  Patient ID: Erin [name], female   DOB: 1935/03/10, 86 y.o.   MRN: [id number]  [name] 06/02/2021, 4:21 PM Bone foam applied to left leg in pacu

## 2021-06-02 NOTE — Op Note (Signed)
DATE OF SURGERY:  06/02/2021 TIME: 11:09 AM  PATIENT NAME:  Erin [name]   AGE: 86 y.o.    PRE-OPERATIVE DIAGNOSIS:  OSTEOARTHRITIS LEFT KNEE  POST-OPERATIVE DIAGNOSIS:  Same  PROCEDURE:  Procedure(s): LEFT TOTAL KNEE ARTHROPLASTY   SURGEON:  Renette Butters, MD   ASSISTANT:  Aggie Moats, PA-C, he was present and scrubbed throughout the case, critical for completion in a timely fashion, and for retraction, instrumentation, and closure.    OPERATIVE IMPLANTS: [employer] Triathlon CR. Press fit knee  Femur size 3, Tibia size 4, Patella size 29 3-peg oval button, with a 9 mm polyethylene insert.   PREOPERATIVE INDICATIONS:  [name] is a 86 y.o. year old female with end stage bone on bone degenerative arthritis of the knee who failed conservative treatment, including injections, antiinflammatories, activity modification, and assistive devices, and had significant impairment of their activities of daily living, and elected for Total Knee Arthroplasty.   The risks, benefits, and alternatives were discussed at length including but not limited to the risks of infection, bleeding, nerve injury, stiffness, blood clots, the need for revision surgery, cardiopulmonary complications, among others, and they were willing to proceed.   OPERATIVE DESCRIPTION:  The patient was brought to the operative room and placed in a supine position.  General anesthesia was administered.  IV antibiotics were given.  The lower extremity was prepped and draped in the usual sterile fashion.  Time out was performed.  The leg was elevated and exsanguinated and the tourniquet was inflated.  Anterior approach was performed.  The patella was everted and osteophytes were removed.  The anterior horn of the medial and lateral meniscus was removed.   The distal femur was opened with the drill and the intramedullary distal femoral cutting jig was utilized, set at 5 degrees resecting 8 mm off the distal femur.  Care  was taken to protect the collateral ligaments.  The distal femoral sizing jig was applied, taking care to avoid notching.  Then the 4-in-1 cutting jig was applied and the anterior and posterior femur was cut, along with the chamfer cuts.  All posterior osteophytes were removed.  The flexion gap was then measured and was symmetric with the extension gap.  Then the extramedullary tibial cutting jig was utilized making the appropriate cut using the anterior tibial crest as a reference building in appropriate posterior slope.  Care was taken during the cut to protect the medial and collateral ligaments.  The proximal tibia was removed along with the posterior horns of the menisci.  The PCL was sacrificed.    The extensor gap was measured and was approximately 64m.    I completed the distal femoral preparation using the appropriate jig to prepare the box.  The patella was then measured, and cut with the saw.    The proximal tibia sized and prepared accordingly with the reamer and the punch, and then all components were trialed with the above sized poly insert.  The knee was found to have excellent balance and full motion.    The above named components were then impacted into place and Poly tibial piece and patella were inserted.  I was very happy with his stability and ROM  I performed a periarticular injection with marcaine and toradol  The knee was easily taken through a range of motion and the patella tracked well and the knee irrigated copiously and the parapatellar and subcutaneous tissue closed with vicryl, and monocryl with steri strips for the skin.  The incision was dressed with sterile gauze and the tourniquet released and the patient was awakened and returned to the PACU in stable and satisfactory condition.  There were no complications.  Total tourniquet time was roughly 60 minutes.   POSTOPERATIVE PLAN: post op Abx, DVT px: SCD's, TED's, Early ambulation and chemical px

## 2021-06-02 NOTE — Anesthesia Procedure Notes (Addendum)
Anesthesia Regional Block: Adductor canal block   Pre-Anesthetic Checklist: , timeout performed,  Correct Patient, Correct Site, Correct Laterality,  Correct Procedure, Correct Position, site marked,  Risks and benefits discussed,  Surgical consent,  Pre-op evaluation,  At surgeon's request and post-op pain management  Laterality: Left  Prep: chloraprep       Needles:  Injection technique: Single-shot  Needle Type: Echogenic Stimulator Needle     Needle Length: 10cm  Needle Gauge: 20     Additional Needles:   Procedures:,,,, ultrasound used (permanent image in chart),,    Narrative:  Start time: 06/02/2021 8:30 AM End time: 06/02/2021 8:35 AM Injection made incrementally with aspirations every 5 mL.  Performed by: Personally  Anesthesiologist: Merlinda Frederick, MD  Additional Notes: Functioning IV was confirmed and monitors were applied.   Sterile prep and drape,hand hygiene and sterile gloves were used. Ultrasound guidance: relevant anatomy identified, needle position confirmed, local anesthetic spread visualized around nerve(s)., vascular puncture avoided.  Image printed for medical record. Negative aspiration and negative test dose prior to incremental administration of local anesthetic. The patient tolerated the procedure well.

## 2021-06-03 NOTE — Anesthesia Postprocedure Evaluation (Signed)
Anesthesia Post Note  Patient: Erin [name]  Procedure(s) Performed: LEFT TOTAL KNEE ARTHROPLASTY (Left: Knee)     Patient location during evaluation: Nursing Unit Anesthesia Type: Regional and Spinal Level of consciousness: oriented and awake and alert Pain management: pain level controlled Vital Signs Assessment: post-procedure vital signs reviewed and stable Respiratory status: spontaneous breathing and respiratory function stable Cardiovascular status: blood pressure returned to baseline and stable Postop Assessment: no headache, no backache, no apparent nausea or vomiting, patient able to bend at knees and spinal receding Anesthetic complications: no   No notable events documented.  Last Vitals:  Vitals:   06/02/21 1506 06/02/21 1630  BP: (!) 178/91 (!) 164/62  Pulse: 77 73  Resp: 16 16  Temp:    SpO2: 100% 95%    Last Pain:  Vitals:   06/02/21 1630  TempSrc:   PainSc: 0-No pain                 Merlinda Frederick

## 2021-06-04 ENCOUNTER — Encounter (HOSPITAL_COMMUNITY): Payer: Self-pay | Admitting: Orthopedic Surgery

## 2021-06-05 DIAGNOSIS — M5440 Lumbago with sciatica, unspecified side: Secondary | ICD-10-CM | POA: Diagnosis not present

## 2021-06-05 DIAGNOSIS — Z7982 Long term (current) use of aspirin: Secondary | ICD-10-CM | POA: Diagnosis not present

## 2021-06-05 DIAGNOSIS — I083 Combined rheumatic disorders of mitral, aortic and tricuspid valves: Secondary | ICD-10-CM | POA: Diagnosis not present

## 2021-06-05 DIAGNOSIS — M1712 Unilateral primary osteoarthritis, left knee: Secondary | ICD-10-CM | POA: Diagnosis not present

## 2021-06-05 DIAGNOSIS — M4316 Spondylolisthesis, lumbar region: Secondary | ICD-10-CM | POA: Diagnosis not present

## 2021-06-05 DIAGNOSIS — I10 Essential (primary) hypertension: Secondary | ICD-10-CM | POA: Diagnosis not present

## 2021-06-05 DIAGNOSIS — Z96652 Presence of left artificial knee joint: Secondary | ICD-10-CM | POA: Diagnosis not present

## 2021-06-05 DIAGNOSIS — Z471 Aftercare following joint replacement surgery: Secondary | ICD-10-CM | POA: Diagnosis not present

## 2021-06-05 DIAGNOSIS — H35033 Hypertensive retinopathy, bilateral: Secondary | ICD-10-CM | POA: Diagnosis not present

## 2021-06-05 DIAGNOSIS — I272 Pulmonary hypertension, unspecified: Secondary | ICD-10-CM | POA: Diagnosis not present

## 2021-06-05 DIAGNOSIS — M8580 Other specified disorders of bone density and structure, unspecified site: Secondary | ICD-10-CM | POA: Diagnosis not present

## 2021-06-05 DIAGNOSIS — G629 Polyneuropathy, unspecified: Secondary | ICD-10-CM | POA: Diagnosis not present

## 2021-06-05 DIAGNOSIS — M47816 Spondylosis without myelopathy or radiculopathy, lumbar region: Secondary | ICD-10-CM | POA: Diagnosis not present

## 2021-06-09 DIAGNOSIS — Z471 Aftercare following joint replacement surgery: Secondary | ICD-10-CM | POA: Diagnosis not present

## 2021-06-09 DIAGNOSIS — I10 Essential (primary) hypertension: Secondary | ICD-10-CM | POA: Diagnosis not present

## 2021-06-09 DIAGNOSIS — M5440 Lumbago with sciatica, unspecified side: Secondary | ICD-10-CM | POA: Diagnosis not present

## 2021-06-09 DIAGNOSIS — G629 Polyneuropathy, unspecified: Secondary | ICD-10-CM | POA: Diagnosis not present

## 2021-06-09 DIAGNOSIS — M47816 Spondylosis without myelopathy or radiculopathy, lumbar region: Secondary | ICD-10-CM | POA: Diagnosis not present

## 2021-06-09 DIAGNOSIS — M4316 Spondylolisthesis, lumbar region: Secondary | ICD-10-CM | POA: Diagnosis not present

## 2021-06-10 DIAGNOSIS — M47816 Spondylosis without myelopathy or radiculopathy, lumbar region: Secondary | ICD-10-CM | POA: Diagnosis not present

## 2021-06-10 DIAGNOSIS — I10 Essential (primary) hypertension: Secondary | ICD-10-CM | POA: Diagnosis not present

## 2021-06-10 DIAGNOSIS — M5440 Lumbago with sciatica, unspecified side: Secondary | ICD-10-CM | POA: Diagnosis not present

## 2021-06-10 DIAGNOSIS — Z471 Aftercare following joint replacement surgery: Secondary | ICD-10-CM | POA: Diagnosis not present

## 2021-06-10 DIAGNOSIS — G629 Polyneuropathy, unspecified: Secondary | ICD-10-CM | POA: Diagnosis not present

## 2021-06-10 DIAGNOSIS — M4316 Spondylolisthesis, lumbar region: Secondary | ICD-10-CM | POA: Diagnosis not present

## 2021-06-12 DIAGNOSIS — M5440 Lumbago with sciatica, unspecified side: Secondary | ICD-10-CM | POA: Diagnosis not present

## 2021-06-12 DIAGNOSIS — G629 Polyneuropathy, unspecified: Secondary | ICD-10-CM | POA: Diagnosis not present

## 2021-06-12 DIAGNOSIS — M4316 Spondylolisthesis, lumbar region: Secondary | ICD-10-CM | POA: Diagnosis not present

## 2021-06-12 DIAGNOSIS — M47816 Spondylosis without myelopathy or radiculopathy, lumbar region: Secondary | ICD-10-CM | POA: Diagnosis not present

## 2021-06-12 DIAGNOSIS — I10 Essential (primary) hypertension: Secondary | ICD-10-CM | POA: Diagnosis not present

## 2021-06-12 DIAGNOSIS — Z471 Aftercare following joint replacement surgery: Secondary | ICD-10-CM | POA: Diagnosis not present

## 2021-06-15 DIAGNOSIS — Z471 Aftercare following joint replacement surgery: Secondary | ICD-10-CM | POA: Diagnosis not present

## 2021-06-15 DIAGNOSIS — G629 Polyneuropathy, unspecified: Secondary | ICD-10-CM | POA: Diagnosis not present

## 2021-06-15 DIAGNOSIS — M5440 Lumbago with sciatica, unspecified side: Secondary | ICD-10-CM | POA: Diagnosis not present

## 2021-06-15 DIAGNOSIS — M47816 Spondylosis without myelopathy or radiculopathy, lumbar region: Secondary | ICD-10-CM | POA: Diagnosis not present

## 2021-06-15 DIAGNOSIS — I10 Essential (primary) hypertension: Secondary | ICD-10-CM | POA: Diagnosis not present

## 2021-06-15 DIAGNOSIS — M4316 Spondylolisthesis, lumbar region: Secondary | ICD-10-CM | POA: Diagnosis not present

## 2021-06-16 DIAGNOSIS — Z471 Aftercare following joint replacement surgery: Secondary | ICD-10-CM | POA: Diagnosis not present

## 2021-06-16 DIAGNOSIS — G629 Polyneuropathy, unspecified: Secondary | ICD-10-CM | POA: Diagnosis not present

## 2021-06-16 DIAGNOSIS — M47816 Spondylosis without myelopathy or radiculopathy, lumbar region: Secondary | ICD-10-CM | POA: Diagnosis not present

## 2021-06-16 DIAGNOSIS — I10 Essential (primary) hypertension: Secondary | ICD-10-CM | POA: Diagnosis not present

## 2021-06-16 DIAGNOSIS — M5440 Lumbago with sciatica, unspecified side: Secondary | ICD-10-CM | POA: Diagnosis not present

## 2021-06-16 DIAGNOSIS — M4316 Spondylolisthesis, lumbar region: Secondary | ICD-10-CM | POA: Diagnosis not present

## 2021-06-17 DIAGNOSIS — M1712 Unilateral primary osteoarthritis, left knee: Secondary | ICD-10-CM | POA: Diagnosis not present

## 2021-06-18 DIAGNOSIS — R262 Difficulty in walking, not elsewhere classified: Secondary | ICD-10-CM | POA: Diagnosis not present

## 2021-06-18 DIAGNOSIS — M25562 Pain in left knee: Secondary | ICD-10-CM | POA: Diagnosis not present

## 2021-06-18 DIAGNOSIS — R531 Weakness: Secondary | ICD-10-CM | POA: Diagnosis not present

## 2021-06-18 DIAGNOSIS — Z96652 Presence of left artificial knee joint: Secondary | ICD-10-CM | POA: Diagnosis not present

## 2021-06-18 DIAGNOSIS — Z4789 Encounter for other orthopedic aftercare: Secondary | ICD-10-CM | POA: Diagnosis not present

## 2021-06-18 DIAGNOSIS — R600 Localized edema: Secondary | ICD-10-CM | POA: Diagnosis not present

## 2021-06-22 DIAGNOSIS — R531 Weakness: Secondary | ICD-10-CM | POA: Diagnosis not present

## 2021-06-22 DIAGNOSIS — R262 Difficulty in walking, not elsewhere classified: Secondary | ICD-10-CM | POA: Diagnosis not present

## 2021-06-22 DIAGNOSIS — R600 Localized edema: Secondary | ICD-10-CM | POA: Diagnosis not present

## 2021-06-22 DIAGNOSIS — M25562 Pain in left knee: Secondary | ICD-10-CM | POA: Diagnosis not present

## 2021-06-22 DIAGNOSIS — Z96652 Presence of left artificial knee joint: Secondary | ICD-10-CM | POA: Diagnosis not present

## 2021-06-22 DIAGNOSIS — Z4789 Encounter for other orthopedic aftercare: Secondary | ICD-10-CM | POA: Diagnosis not present

## 2021-06-24 DIAGNOSIS — Z96652 Presence of left artificial knee joint: Secondary | ICD-10-CM | POA: Diagnosis not present

## 2021-06-24 DIAGNOSIS — M25562 Pain in left knee: Secondary | ICD-10-CM | POA: Diagnosis not present

## 2021-06-24 DIAGNOSIS — R600 Localized edema: Secondary | ICD-10-CM | POA: Diagnosis not present

## 2021-06-24 DIAGNOSIS — R262 Difficulty in walking, not elsewhere classified: Secondary | ICD-10-CM | POA: Diagnosis not present

## 2021-06-24 DIAGNOSIS — R531 Weakness: Secondary | ICD-10-CM | POA: Diagnosis not present

## 2021-06-24 DIAGNOSIS — Z4789 Encounter for other orthopedic aftercare: Secondary | ICD-10-CM | POA: Diagnosis not present

## 2021-07-01 DIAGNOSIS — R600 Localized edema: Secondary | ICD-10-CM | POA: Diagnosis not present

## 2021-07-01 DIAGNOSIS — Z4789 Encounter for other orthopedic aftercare: Secondary | ICD-10-CM | POA: Diagnosis not present

## 2021-07-01 DIAGNOSIS — Z96652 Presence of left artificial knee joint: Secondary | ICD-10-CM | POA: Diagnosis not present

## 2021-07-01 DIAGNOSIS — M25562 Pain in left knee: Secondary | ICD-10-CM | POA: Diagnosis not present

## 2021-07-01 DIAGNOSIS — R262 Difficulty in walking, not elsewhere classified: Secondary | ICD-10-CM | POA: Diagnosis not present

## 2021-07-01 DIAGNOSIS — R531 Weakness: Secondary | ICD-10-CM | POA: Diagnosis not present

## 2021-07-02 ENCOUNTER — Ambulatory Visit (INDEPENDENT_AMBULATORY_CARE_PROVIDER_SITE_OTHER): Payer: Medicare Other | Admitting: Family Medicine

## 2021-07-02 ENCOUNTER — Telehealth: Payer: Self-pay | Admitting: Interventional Cardiology

## 2021-07-02 ENCOUNTER — Encounter: Payer: Self-pay | Admitting: Family Medicine

## 2021-07-02 ENCOUNTER — Other Ambulatory Visit: Payer: Self-pay

## 2021-07-02 VITALS — BP 135/64 | HR 60 | Temp 97.8°F | Resp 16 | Ht 62.0 in | Wt 122.0 lb

## 2021-07-02 DIAGNOSIS — I340 Nonrheumatic mitral (valve) insufficiency: Secondary | ICD-10-CM

## 2021-07-02 DIAGNOSIS — M25562 Pain in left knee: Secondary | ICD-10-CM | POA: Diagnosis not present

## 2021-07-02 DIAGNOSIS — Z96652 Presence of left artificial knee joint: Secondary | ICD-10-CM | POA: Diagnosis not present

## 2021-07-02 DIAGNOSIS — M25462 Effusion, left knee: Secondary | ICD-10-CM | POA: Diagnosis not present

## 2021-07-02 DIAGNOSIS — R61 Generalized hyperhidrosis: Secondary | ICD-10-CM

## 2021-07-02 DIAGNOSIS — M792 Neuralgia and neuritis, unspecified: Secondary | ICD-10-CM | POA: Diagnosis not present

## 2021-07-02 MED ORDER — GABAPENTIN 300 MG PO CAPS: 300.0000 mg | ORAL_CAPSULE | Freq: Three times a day (TID) | ORAL | 3 refills | Status: DC

## 2021-07-02 NOTE — Telephone Encounter (Signed)
Pt c/o of Chest Pain: 1. Are you having CP right now? Patient states she feels heavy in the chest now discomfort  2. Are you experiencing any other symptoms (ex. SOB, nausea, vomiting, sweating)? SOB sweating at night 3. How long have you been experiencing CP? Since her left knee surgery  4. Is your CP continuous or coming and going? Continuous  5. Have you taken Nitroglycerin? No

## 2021-07-02 NOTE — Telephone Encounter (Signed)
Pt states she has been having consistent CP and SOB x 1 month now, since shortly after her knee surgery. Sweating at night.  Pain is continuous.  Eases off at times but never fully resolves.  Advised pt to report to ER for evaluation as there is concern for a blood clot post recent surgery.  Pt agreeable to plan.

## 2021-07-02 NOTE — Progress Notes (Signed)
OFFICE VISIT  07/02/2021  CC:  Chief Complaint  Patient presents with   Feet pain    Feels like burning sensation; occurs at night.   HPI:    Patient is a 86 y.o. Caucasian female who presents for feet pain. I last saw her 01/14/21. A/P as of that visit: "1) HTN; well controlled. Lytes/cr good 11/07/20. BMET today.   2) Mitral valve regurgitation; improved by echo since mitral valve clip procedure. Due to her self d/c of her ASA, the plan to stay on DAPT x 80mo after procedure has been disrupted.  Since having ongoing bleeding issues (see below), will cont as current-->plavix 75mg  qd but no ASA.  Plan switch to ASA 81mg  qd alone starting 02/04/21.   3) Gross hematuria, painless. UA with ? Sign of infection.   Will send for c/s and call pt and get her to start abx (bactrim ds bid x 5d) and plan recheck urine 2 wks. Cbc and bmet today. Urol ref. Ok to cont plavix for now but cont hold ASA. If Hb down then will have her d/c plavix as well.   4) Epistaxis; question of area in need of cauterization in L nostril. ENT ref ordered. She will stay on plavix 75mg  qd and continue to hole her ASA. If hb has dropped then will have her hold plavix as well.   5) R hand tremor: again, I have not detected any tremor on exam. Reassured at this time.   6) Osteoarthritis knees and back: pt states she wants to proceed with TKA she has been planning with her orthopedist, so I expect that will take place later this summer."  INTERIM HX: Having chronic bilat burning feet pain from toes to ankles bilaterally--last couple years. Was on gabapentin 300mg  in past but she doesn't know when she stopped it, doesn't recall how often she took it, but says she stopped it b/c it did not help.    She underwent L TKA 1 month ago.  She has been in PT, is trying to ween herself off pain med, most recent vicodin was yesterday.   Has initial ortho f/u appt set for 07/15/21.  Says she is having night sweats lately.   Denies fevers though. No n/v but appetite is poor.  No dysuria or unusual urgency or frequency. No abd pain, no CP.  She does have intermittent brief need to take a couple of deep breath but no persistent DOB.  She only ambulates short distances very slowly using a walker and this does not make her dyspneic.  No palpitations or dizziness.  PMP AWARE reviewed today: most recent rx for vicodin 5/325 was filled 06/19/21, # 70, rx by orthopedist provider.  Prior to that she had #28 vicodin filled on 8/17 and 8/12 as well--by ortho. She had oxycodone filled 8/9, #42--by ortho. No red flags.    Past Medical History:  Diagnosis Date   Achilles tendon rupture    left, no surgery required   Gross hematuria 12/2020   Klebsiella on urine clx x2 but no UTI sx's.  CT showed tiny stones in each renal pelvis but no ureteral or bladder stones, no mass.  Urol did cysto->normal. Plan is annual UA, rpt hematuria w/u in 3-5 yrs if persistent pos.   History of kidney stones    Hypertension    Hypertensive retinopathy of both eyes    Dr. Herbert Deaner   Low back pain    spondylosis, listhesis, +scoliosis at L/S jxn   Lower  extremity edema    lasix prn   OA (osteoarthritis)    Osteopenia    S/P mitral valve clip implantation 11/06/2020   s/p TEER with one XTW MitraClip on A2P2 by Dr. Burt Knack.  DAPT w/ASA and plavix x 3 mo post procedure recommended   Severe mitral regurgitation    2021 transthoracic echo and TEE->cardiology following.  Mitral valve clip 10/2020.  f/u echo 10/28/20 mild/mod MVR and tricusp regurg   Severe pulmonary hypertension ([place]) 2021   transth echo; moderate by TEE 22/0254   Systolic murmur 27/03/2375   severe mitral regurge, mod/severe tricuspic regurg    Past Surgical History:  Procedure Laterality Date   APPENDECTOMY     86 yrs old   [place]  2004   ruptured disc   CHOLECYSTECTOMY     age 24   CYSTOSCOPY  02/24/2021   (for gross hematuria) ->normal.   LUMBAR LAMINECTOMY  2004    MITRAL VALVE REPAIR N/A 11/06/2020   Procedure: MITRAL VALVE REPAIR;  Surgeon: Sherren Mocha, MD;  Location: [facility];  Service: Cardiovascular;  Laterality: N/A;   RIGHT/LEFT HEART CATH AND CORONARY ANGIOGRAPHY N/A 09/16/2020   No CAD. Procedure: RIGHT/LEFT HEART CATH AND CORONARY ANGIOGRAPHY;  Surgeon: Belva Crome, MD;  Location: [facility];  Service: Cardiovascular;  Laterality: N/A;   TEE WITHOUT CARDIOVERSION N/A 09/16/2020   Procedure: TRANSESOPHAGEAL ECHOCARDIOGRAM (TEE);  Surgeon: Buford Dresser, MD;  Location: [facility];  Service: Cardiovascular;  Laterality: N/A;   TEE WITHOUT CARDIOVERSION N/A 11/06/2020   Procedure: TRANSESOPHAGEAL ECHOCARDIOGRAM (TEE);  Surgeon: Sherren Mocha, MD;  Location: [facility];  Service: Cardiovascular;  Laterality: N/A;   TONSILLECTOMY     age 7   TOTAL KNEE ARTHROPLASTY Left 06/02/2021   Procedure: LEFT TOTAL KNEE ARTHROPLASTY;  Surgeon: Renette Butters, MD;  Location: [facility];  Service: Orthopedics;  Laterality: Left;   TRANSTHORACIC ECHOCARDIOGRAM  07/03/2020; 10/28/20; 12/03/20   06/2020 TTE EF 60-65%, grd II DD, severe pulm art HTN, severe mitral valve regurg, mod/sev tricuspic valve regurg. Conf on TEE 08/2020.  10/28/20 (s/p MV clip procedure) mild-mod MVR, EF 55-60%. 11/2020 EF 60%, mild/mod MR w/mean grad 54mm hg and mod to sev TR.    Outpatient Medications Prior to Visit  Medication Sig Dispense Refill   Ascorbic Acid (VITAMIN C PO) Take 1,000 mg by mouth daily.     aspirin EC 81 MG tablet 1 tablet twice a day for 30 days to prevent blood clots after surgery 60 tablet 0   carvedilol (COREG) 25 MG tablet Take 1 tablet (25 mg total) by mouth daily. 90 tablet 3   cholecalciferol (VITAMIN D) 25 MCG (1000 UNIT) tablet Take 1,000 Units by mouth daily.     diazepam (VALIUM) 2 MG tablet Take 2 mg by mouth daily as needed for anxiety.     loratadine (CLARITIN) 10 MG tablet TAKE ONE TABLET BY MOUTH EVERY DAY AS  NEEDED FOR ALLERGY (Patient taking differently: Take 10 mg by mouth daily as needed for allergies.) 90 tablet 3   Multiple Vitamin (MULTIVITAMIN WITH MINERALS) TABS tablet Take 1 tablet by mouth daily.     Multiple Vitamins-Minerals (PRESERVISION AREDS PO) Take 1 tablet by mouth in the morning and at bedtime.      Multiple Vitamins-Minerals (ZINC PO) Take 50 mg by mouth daily.     polyethylene glycol (MIRALAX) 17 g packet Take 17 g by mouth 2 (two) times daily. 17 grams in 6  oz of favorite drink twice a day until bowel movement.  LAXITIVE.  Restart if two days since last bowel movement 14 packet 0   sodium chloride (MURO 128) 5 % ophthalmic solution Place 1 drop into both eyes at bedtime.     vitamin E 180 MG (400 UNITS) capsule Take 400 Units by mouth daily.     amoxicillin (AMOXIL) 500 MG tablet Take 4 tablets (2,000 mg) one hour prior to all dental visits. (Patient not taking: Reported on 07/02/2021) 8 tablet 11   docusate sodium (COLACE) 100 MG capsule 1 tablet twice a day while on narcotics to prevent constipation (Patient not taking: Reported on 07/02/2021) 60 capsule 2   furosemide (LASIX) 20 MG tablet Take 1 tablet (20 mg total) by mouth every Monday, Wednesday, and Friday. (Patient not taking: Reported on 07/02/2021) 36 tablet 0   nitroGLYCERIN (NITRODUR - DOSED IN MG/24 HR) 0.2 mg/hr patch 1/2 patch over L Achilles tendon bid (Patient not taking: Reported on 07/02/2021) 30 patch 12   ondansetron (ZOFRAN) 4 MG tablet Take 1 tablet (4 mg total) by mouth every 8 (eight) hours as needed for nausea or vomiting. (Patient not taking: Reported on 07/02/2021) 30 tablet 0   potassium chloride (KLOR-CON) 10 MEQ tablet Take 10 mEq by mouth daily as needed (when taking lasix). (Patient not taking: Reported on 07/02/2021)     gabapentin (NEURONTIN) 300 MG capsule 1 tablet at night as needed for nerve pain (Patient not taking: Reported on 07/02/2021) 30 capsule 0   No facility-administered medications prior to visit.     Allergies  Allergen Reactions   Barbiturates Nausea And Vomiting   Demerol Other (See Comments)    sick   Hydrochlorothiazide Other (See Comments)    unknown   Irbesartan     HA   Losartan Potassium     hair loss   Olmesartan Medoxomil     hair loss   Telmisartan     cramps    ROS As per HPI  PE: Vitals with BMI 07/02/2021 06/02/2021 06/02/2021  Height '5\' 2"'$  - -  Weight 122 lbs - -  BMI 123456 - -  Systolic [id number]  Diastolic 64 62 91  Pulse 60 73 77   Gen: alert, tired-appearing but not acutely ill-appearing. AFFECT: pleasant, lucid thought and speech. CV: RRR, 4/6 systolic murmur, S1 and S2 fairly distinct, no diastolic murmur.  No r/g. Chest is clear, no wheezing or rales. Normal symmetric air entry throughout both lung fields. No chest wall deformities or tenderness. EXT: no clubbing or cyanosis.  no edema.  Left knee with diffuse swelling/bony hypertrophy, ++warmth, mild diffuse TTP, flexion limited to 90 deg.  Surgical scar healing approp. LL's: strength 4/5 ankle dorsiflexion and plantar flexion.  No LL TTP or rash.  Color and temp normal in ankles and feet.    LABS:    Chemistry      Component Value Date/Time   NA 141 05/22/2021 1000   NA 148 (H) 08/27/2020 1525   K 4.1 05/22/2021 1000   CL 104 05/22/2021 1000   CO2 31 05/22/2021 1000   BUN 28 (H) 05/22/2021 1000   BUN 18 08/27/2020 1525   CREATININE 0.66 05/22/2021 1000   CREATININE 0.61 01/14/2021 1138      Component Value Date/Time   CALCIUM 9.2 05/22/2021 1000   ALKPHOS 66 11/04/2020 1420   AST 18 01/14/2021 1138   ALT 12 01/14/2021 1138   BILITOT 0.6 01/14/2021 1138  Lab Results  Component Value Date   WBC 8.3 05/22/2021   HGB 14.0 05/22/2021   HCT 43.5 05/22/2021   MCV 91.6 05/22/2021   PLT 240 05/22/2021   Lab Results  Component Value Date   TSH 2.37 07/02/2020   Lab Results  Component Value Date   VITAMINB12 923 (H) 10/09/2015   IMPRESSION AND PLAN:  1) Neuropathic  pain bilat feet.  Suspect idiopathic PN.   Unclear if she adequate trial of gabapentin so we'll start there-->300mg  bid and I'll inc in 2-3 wks if needed.  2) Night sweats, malaise: with her relatively recent knee arthroplasty and hx of mitral valve clip I want to keep endocarditis in mind.  Will check cbc w/diff, cmet, esr, crp today.  3) L knee swelling and warmth. This is over and above what I would expect for being 1 month out from knee replacement. Unclear from pt how long it has looked/felt like it does. We've contacted her orthopedist and moved her f/u appt to this coming Monday the 12th (Dr. Percell Miller). CBC w/diff, esr, crp.  An After Visit Summary was printed and given to the patient.  FOLLOW UP: Return for 2-3 wk f/u neuropathic pain.  Signed:  Crissie Sickles, MD           07/02/2021

## 2021-07-02 NOTE — Patient Instructions (Signed)
Take gabapentin '300mg'$  only at night x 3 nights, then start taking it every morning and every night until you see me again.

## 2021-07-03 ENCOUNTER — Emergency Department (HOSPITAL_BASED_OUTPATIENT_CLINIC_OR_DEPARTMENT_OTHER): Payer: Medicare Other

## 2021-07-03 ENCOUNTER — Encounter: Payer: Self-pay | Admitting: Family Medicine

## 2021-07-03 ENCOUNTER — Other Ambulatory Visit: Payer: Self-pay

## 2021-07-03 ENCOUNTER — Emergency Department (HOSPITAL_BASED_OUTPATIENT_CLINIC_OR_DEPARTMENT_OTHER)
Admission: EM | Admit: 2021-07-03 | Discharge: 2021-07-03 | Disposition: A | Discharge status: Home / Self Care | Payer: Medicare Other | Attending: Emergency Medicine | Admitting: Emergency Medicine

## 2021-07-03 DIAGNOSIS — R2242 Localized swelling, mass and lump, left lower limb: Secondary | ICD-10-CM | POA: Insufficient documentation

## 2021-07-03 DIAGNOSIS — R0602 Shortness of breath: Secondary | ICD-10-CM | POA: Insufficient documentation

## 2021-07-03 DIAGNOSIS — I110 Hypertensive heart disease with heart failure: Secondary | ICD-10-CM | POA: Insufficient documentation

## 2021-07-03 DIAGNOSIS — Z96652 Presence of left artificial knee joint: Secondary | ICD-10-CM | POA: Diagnosis not present

## 2021-07-03 DIAGNOSIS — R079 Chest pain, unspecified: Secondary | ICD-10-CM | POA: Diagnosis not present

## 2021-07-03 DIAGNOSIS — R0789 Other chest pain: Secondary | ICD-10-CM | POA: Diagnosis not present

## 2021-07-03 DIAGNOSIS — Z7982 Long term (current) use of aspirin: Secondary | ICD-10-CM | POA: Diagnosis not present

## 2021-07-03 DIAGNOSIS — Z79899 Other long term (current) drug therapy: Secondary | ICD-10-CM | POA: Diagnosis not present

## 2021-07-03 DIAGNOSIS — R0600 Dyspnea, unspecified: Secondary | ICD-10-CM | POA: Diagnosis not present

## 2021-07-03 DIAGNOSIS — I517 Cardiomegaly: Secondary | ICD-10-CM | POA: Diagnosis not present

## 2021-07-03 DIAGNOSIS — I5032 Chronic diastolic (congestive) heart failure: Secondary | ICD-10-CM | POA: Insufficient documentation

## 2021-07-03 HISTORY — DX: Chronic diastolic (congestive) heart failure: I50.32

## 2021-07-03 LAB — CBC WITH DIFFERENTIAL/PLATELET
Basophils Absolute: 0 10*3/uL (ref 0.0–0.1)
Basophils Relative: 0.7 % (ref 0.0–3.0)
Eosinophils Absolute: 0.2 10*3/uL (ref 0.0–0.7)
Eosinophils Relative: 2.1 % (ref 0.0–5.0)
HCT: 39.9 % (ref 36.0–46.0)
Hemoglobin: 12.6 g/dL (ref 12.0–15.0)
Lymphocytes Relative: 19.7 % (ref 12.0–46.0)
Lymphs Abs: 1.5 10*3/uL (ref 0.7–4.0)
MCHC: 31.6 g/dL (ref 30.0–36.0)
MCV: 89.8 fl (ref 78.0–100.0)
Monocytes Absolute: 0.7 10*3/uL (ref 0.1–1.0)
Monocytes Relative: 9.9 % (ref 3.0–12.0)
Neutro Abs: 5 10*3/uL (ref 1.4–7.7)
Neutrophils Relative %: 67.6 % (ref 43.0–77.0)
Platelets: 317 10*3/uL (ref 150.0–400.0)
RBC: 4.45 Mil/uL (ref 3.87–5.11)
RDW: 16 % — ABNORMAL HIGH (ref 11.5–15.5)
WBC: 7.4 10*3/uL (ref 4.0–10.5)

## 2021-07-03 LAB — COMPREHENSIVE METABOLIC PANEL
ALT: 9 U/L (ref 0–35)
AST: 13 U/L (ref 0–37)
Albumin: 4.1 g/dL (ref 3.5–5.2)
Alkaline Phosphatase: 82 U/L (ref 39–117)
BUN: 20 mg/dL (ref 6–23)
CO2: 31 mEq/L (ref 19–32)
Calcium: 9.4 mg/dL (ref 8.4–10.5)
Chloride: 103 mEq/L (ref 96–112)
Creatinine, Ser: 0.67 mg/dL (ref 0.40–1.20)
GFR: 80.3 mL/min (ref >60.00)
Glucose, Bld: 101 mg/dL — ABNORMAL HIGH (ref 70–99)
Potassium: 4.1 mEq/L (ref 3.5–5.1)
Sodium: 142 mEq/L (ref 135–145)
Total Bilirubin: 0.8 mg/dL (ref 0.2–1.2)
Total Protein: 6.6 g/dL (ref 6.0–8.3)

## 2021-07-03 LAB — CBC
HCT: 38.9 % (ref 36.0–46.0)
Hemoglobin: 12.4 g/dL (ref 12.0–15.0)
MCH: 29.2 pg (ref 26.0–34.0)
MCHC: 31.9 g/dL (ref 30.0–36.0)
MCV: 91.5 fL (ref 80.0–100.0)
Platelets: 308 10*3/uL (ref 150–400)
RBC: 4.25 MIL/uL (ref 3.87–5.11)
RDW: 15.4 % (ref 11.5–15.5)
WBC: 6.7 10*3/uL (ref 4.0–10.5)
nRBC: 0 % (ref 0.0–0.2)

## 2021-07-03 LAB — BASIC METABOLIC PANEL
Anion gap: 6 (ref 5–15)
BUN: 20 mg/dL (ref 8–23)
CO2: 29 mmol/L (ref 22–32)
Calcium: 9.1 mg/dL (ref 8.9–10.3)
Chloride: 105 mmol/L (ref 98–111)
Creatinine, Ser: 0.63 mg/dL (ref 0.44–1.00)
GFR, Estimated: >60 mL/min (ref >60)
Glucose, Bld: 117 mg/dL — ABNORMAL HIGH (ref 70–99)
Potassium: 3.6 mmol/L (ref 3.5–5.1)
Sodium: 140 mmol/L (ref 135–145)

## 2021-07-03 LAB — C-REACTIVE PROTEIN: CRP: <1 mg/dL (ref 0.5–20.0)

## 2021-07-03 LAB — D-DIMER, QUANTITATIVE: D-Dimer, Quant: 3.74 ug/mL-FEU — ABNORMAL HIGH (ref 0.00–0.50)

## 2021-07-03 LAB — SEDIMENTATION RATE: Sed Rate: 12 mm/hr (ref 0–30)

## 2021-07-03 LAB — TROPONIN I (HIGH SENSITIVITY)
Troponin I (High Sensitivity): 6 ng/L (ref <18)
Troponin I (High Sensitivity): 8 ng/L (ref <18)

## 2021-07-03 MED ORDER — IOHEXOL 350 MG/ML SOLN
100.0000 mL | Freq: Once | INTRAVENOUS | Status: AC | PRN
  Administered 2021-07-03: 100 mL via INTRAVENOUS

## 2021-07-03 NOTE — Discharge Instructions (Addendum)
Follow-up with Dr. Tamala Julian regarding your shortness of breath.  You did have some dilatation of your common bile duct which appeared to be increased from your prior CT.  Follow-up with Dr. Ernestine Conrad regarding this.

## 2021-07-03 NOTE — ED Provider Notes (Addendum)
[facility] Provider Note   CSN: [id number] Arrival date & time: 07/03/21  1700     History Chief Complaint  Patient presents with   Chest Pain    Erin [name] is a 86 y.o. female.  Patient is a 86 year old female who presents with shortness of breath.  She has a history of CHF, mitral valve clip implantation secondary to severe mitral regurgitation, pulmonary hypertension.  She had a left knee replacement about a month ago.  She says since that time she has had some shortness of breath.  Its been a little bit worse over the last 2 days.  She has had some tightness across her chest but she says that is been going on since January and is not any different than it has been in the past.  She currently denies any chest discomfort.  She has been waking up with some night sweats for the last few days.  She denies any known fevers.  She spoke with her cardiologist, Dr. Tamala Julian who recommended that she come to the emergency room to assess for possible PE.  She denies any increased pain or swelling to the left leg.  She does have some mild swelling to the knee and the left lower leg since the surgery but says is not any change than it has been.  She denies any known fevers.      Past Medical History:  Diagnosis Date   Achilles tendon rupture    left, no surgery required   Chronic diastolic heart failure ([place])    Gross hematuria 12/2020   Klebsiella on urine clx x2 but no UTI sx's.  CT showed tiny stones in each renal pelvis but no ureteral or bladder stones, no mass.  Urol did cysto->normal. Plan is annual UA, rpt hematuria w/u in 3-5 yrs if persistent pos.   History of kidney stones    Hypertension    Hypertensive retinopathy of both eyes    Dr. Herbert Deaner   Low back pain    spondylosis, listhesis, +scoliosis at L/S jxn   Lower extremity edema    lasix prn   OA (osteoarthritis)    Osteopenia    S/P mitral valve clip implantation 11/06/2020   s/p TEER with one  XTW MitraClip on A2P2 by Dr. Burt Knack.  DAPT w/ASA and plavix x 3 mo post procedure recommended   Severe mitral regurgitation    2021 transthoracic echo and TEE->cardiology following.  Mitral valve clip 10/2020.  f/u echo 10/28/20 mild/mod MVR and tricusp regurg   Severe pulmonary hypertension ([place]) 2021   transth echo; moderate by TEE [id number]   Systolic murmur 0000000   severe mitral regurge, mod/severe tricuspic regurg    Patient Active Problem List   Diagnosis Date Noted   Non-rheumatic mitral regurgitation 11/06/2020   S/P mitral valve clip implantation 11/06/2020   Hypertension    Severe pulmonary hypertension ([place]) 2021   Pain in left foot 08/27/2019   Osteoarthritis of right knee 05/08/2018   Osteoarthritis of left knee 05/08/2018   Fatigue 09/22/2016   History of CVA (cerebrovascular accident) 09/19/2012   Hematuria, gross 10/27/2011   Obesity 09/09/2011   Cystitis 05/21/2010   HEARING IMPAIRMENT 01/25/2008   Essential hypertension 10/21/2007   Degenerative scoliosis 10/21/2007   OSTEOPENIA 10/21/2007   Gout 10/20/2007    Past Surgical History:  Procedure Laterality Date   APPENDECTOMY     86 yrs old   [place]  2004   ruptured disc  CHOLECYSTECTOMY     age 85   CYSTOSCOPY  02/24/2021   (for gross hematuria) ->normal.   LUMBAR LAMINECTOMY  2004   MITRAL VALVE REPAIR N/A 11/06/2020   Procedure: MITRAL VALVE REPAIR;  Surgeon: Sherren Mocha, MD;  Location: [facility];  Service: Cardiovascular;  Laterality: N/A;   RIGHT/LEFT HEART CATH AND CORONARY ANGIOGRAPHY N/A 09/16/2020   No CAD. Procedure: RIGHT/LEFT HEART CATH AND CORONARY ANGIOGRAPHY;  Surgeon: Belva Crome, MD;  Location: [facility];  Service: Cardiovascular;  Laterality: N/A;   TEE WITHOUT CARDIOVERSION N/A 09/16/2020   Procedure: TRANSESOPHAGEAL ECHOCARDIOGRAM (TEE);  Surgeon: Buford Dresser, MD;  Location: [facility]COPY;  Service: Cardiovascular;  Laterality: N/A;   TEE  WITHOUT CARDIOVERSION N/A 11/06/2020   Procedure: TRANSESOPHAGEAL ECHOCARDIOGRAM (TEE);  Surgeon: Sherren Mocha, MD;  Location: [facility];  Service: Cardiovascular;  Laterality: N/A;   TONSILLECTOMY     age 42   TOTAL KNEE ARTHROPLASTY Left 06/02/2021   Procedure: LEFT TOTAL KNEE ARTHROPLASTY;  Surgeon: Renette Butters, MD;  Location: [facility];  Service: Orthopedics;  Laterality: Left;   TRANSTHORACIC ECHOCARDIOGRAM  07/03/2020; 10/28/20; 12/03/20   06/2020 TTE EF 60-65%, grd II DD, severe pulm art HTN, severe mitral valve regurg, mod/sev tricuspic valve regurg. Conf on TEE 08/2020.  10/28/20 (s/p MV clip procedure) mild-mod MVR, EF 55-60%. 11/2020 EF 60%, mild/mod MR w/mean grad 52m hg and mod to sev TR.     OB History   No obstetric history on file.     Family History  Problem Relation Age of Onset   Hypertension Mother    Early death Father    Cancer Father    Hypertension Other     Social History   Tobacco Use   Smoking status: Never   Smokeless tobacco: Never  Vaping Use   Vaping Use: Never used  Substance Use Topics   Alcohol use: No   Drug use: Never    Home Medications Prior to Admission medications   Medication Sig Start Date End Date Taking? Authorizing Provider  amoxicillin (AMOXIL) 500 MG tablet Take 4 tablets (2,000 mg) one hour prior to all dental visits. Patient not taking: Reported on 07/02/2021 12/04/20   TEileen Stanford PA-C  Ascorbic Acid (VITAMIN C PO) Take 1,000 mg by mouth daily.    [provider]  aspirin EC 81 MG tablet 1 tablet twice a day for 30 days to prevent blood clots after surgery 06/02/21   Shepperson, Kirstin, PA-C  carvedilol (COREG) 25 MG tablet Take 1 tablet (25 mg total) by mouth daily. 07/02/20   McGowen, PAdrian Blackwater MD  cholecalciferol (VITAMIN D) 25 MCG (1000 UNIT) tablet Take 1,000 Units by mouth daily.    [provider]  diazepam (VALIUM) 2 MG tablet Take 2 mg by mouth daily as needed for anxiety.    [provider]  docusate sodium (COLACE) 100 MG capsule 1 tablet twice a day while on narcotics to prevent constipation Patient not taking: Reported on 07/02/2021 06/02/21   SMatthew Saras PA-C  furosemide (LASIX) 20 MG tablet Take 1 tablet (20 mg total) by mouth every Monday, Wednesday, and Friday. Patient not taking: Reported on 07/02/2021 09/22/20 05/19/21  CSherren Mocha MD  gabapentin (NEURONTIN) 300 MG capsule Take 1 capsule (300 mg total) by mouth 3 (three) times daily. 07/02/21   McGowen, PAdrian Blackwater MD  loratadine (CLARITIN) 10 MG tablet TAKE ONE TABLET BY MOUTH EVERY DAY AS NEEDED FOR ALLERGY  Patient taking differently: Take 10 mg by mouth daily as needed for allergies. 01/30/20   Plotnikov, Evie Lacks, MD  Multiple Vitamin (MULTIVITAMIN WITH MINERALS) TABS tablet Take 1 tablet by mouth daily.    [provider]  Multiple Vitamins-Minerals (PRESERVISION AREDS PO) Take 1 tablet by mouth in the morning and at bedtime.     [provider]  Multiple Vitamins-Minerals (ZINC PO) Take 50 mg by mouth daily.    [provider]  nitroGLYCERIN (NITRODUR - DOSED IN MG/24 HR) 0.2 mg/hr patch 1/2 patch over L Achilles tendon bid Patient not taking: Reported on 07/02/2021 07/24/19   Plotnikov, Evie Lacks, MD  ondansetron (ZOFRAN) 4 MG tablet Take 1 tablet (4 mg total) by mouth every 8 (eight) hours as needed for nausea or vomiting. Patient not taking: Reported on 07/02/2021 06/02/21 06/02/22  Shepperson, Kirstin, PA-C  polyethylene glycol (MIRALAX) 17 g packet Take 17 g by mouth 2 (two) times daily. 17 grams in 6 oz of favorite drink twice a day until bowel movement.  LAXITIVE.  Restart if two days since last bowel movement 06/02/21   Shepperson, Kirstin, PA-C  potassium chloride (KLOR-CON) 10 MEQ tablet Take 10 mEq by mouth daily as needed (when taking lasix). Patient not taking: Reported on 07/02/2021    [provider]  sodium chloride (MURO 128) 5 % ophthalmic solution Place 1 drop  into both eyes at bedtime.    [provider]  vitamin E 180 MG (400 UNITS) capsule Take 400 Units by mouth daily.    [provider]    Allergies    Barbiturates, Demerol, Hydrochlorothiazide, Irbesartan, Losartan potassium, Olmesartan medoxomil, and Telmisartan  Review of Systems   Review of Systems  Constitutional:  Negative for chills, diaphoresis, fatigue and fever.       Night sweats  HENT:  Negative for congestion, rhinorrhea and sneezing.   Eyes: Negative.   Respiratory:  Positive for chest tightness and shortness of breath. Negative for cough.   Cardiovascular:  Negative for chest pain and leg swelling.  Gastrointestinal:  Negative for abdominal pain, blood in stool, diarrhea, nausea and vomiting.  Genitourinary:  Negative for difficulty urinating, flank pain, frequency and hematuria.  Musculoskeletal:  Positive for arthralgias. Negative for back pain.  Skin:  Negative for rash.  Neurological:  Negative for dizziness, speech difficulty, weakness, numbness and headaches.   Physical Exam Updated Vital Signs BP (!) 187/89   Pulse 76   Temp 98.3 F (36.8 C) (Oral)   Resp 16   Ht '5\' 2"'$  (1.575 m)   Wt 55.8 kg   SpO2 96%   BMI 22.50 kg/m   Physical Exam Constitutional:      Appearance: She is well-developed.  HENT:     Head: Normocephalic and atraumatic.  Eyes:     Pupils: Pupils are equal, round, and reactive to light.  Cardiovascular:     Rate and Rhythm: Normal rate and regular rhythm.     Heart sounds: Normal heart sounds.  Pulmonary:     Effort: Pulmonary effort is normal. No respiratory distress.     Breath sounds: Normal breath sounds. No wheezing or rales.  Chest:     Chest wall: No tenderness.  Abdominal:     General: Bowel sounds are normal.     Palpations: Abdomen is soft.     Tenderness: There is no abdominal tenderness. There is no guarding or rebound.  Musculoskeletal:        General: Normal range of  motion.     Cervical back:  Normal range of motion and neck supple.     Comments: There is some mild swelling to the left knee and the left lower leg.  There is some mild warmth over the knee.  Mild erythema although she says its not any different than it has been.  Lymphadenopathy:     Cervical: No cervical adenopathy.  Skin:    General: Skin is warm and dry.     Findings: No rash.  Neurological:     Mental Status: She is alert and oriented to person, place, and time.    ED Results / Procedures / Treatments   Labs (all labs ordered are listed, but only abnormal results are displayed) Labs Reviewed  BASIC METABOLIC PANEL - Abnormal; Notable for the following components:      Result Value   Glucose, Bld 117 (*)    All other components within normal limits  D-DIMER, QUANTITATIVE - Abnormal; Notable for the following components:   D-Dimer, Quant 3.74 (*)    All other components within normal limits  CBC  TROPONIN I (HIGH SENSITIVITY)  TROPONIN I (HIGH SENSITIVITY)    EKG EKG Interpretation  Date/Time:  Friday July 03 2021 17:09:25 EDT Ventricular Rate:  64 PR Interval:  154 QRS Duration: 84 QT Interval:  418 QTC Calculation: 431 R Axis:   37 Text Interpretation: Normal sinus rhythm Normal ECG since last tracing no significant change Confirmed by Malvin Johns [phone]) on 07/03/2021 7:58:21 PM  Radiology CT Angio Chest PE W/Cm &/Or Wo Cm  Result Date: 07/03/2021 CLINICAL DATA:  PE suspected, low/intermediate prob, positive D-dimer. Chest pain, dyspnea, night sweats EXAM: CT ANGIOGRAPHY CHEST WITH CONTRAST TECHNIQUE: Multidetector CT imaging of the chest was performed using the standard protocol during bolus administration of intravenous contrast. Multiplanar CT image reconstructions and MIPs were obtained to evaluate the vascular anatomy. CONTRAST:  1108m OMNIPAQUE IOHEXOL 350 MG/ML SOLN COMPARISON:  None. FINDINGS: Cardiovascular: There is adequate opacification of the a pulmonary arterial tree through  the segmental level. No intraluminal filling defect identified to suggest acute pulmonary embolism. The central pulmonary arteries are mildly enlarged in keeping with changes of pulmonary arterial hypertension. Global cardiac size is mildly enlarged. Mitral valve clip noted. No pericardial effusion. Mild atherosclerotic calcification within the thoracic aorta. No aortic aneurysm. Mediastinum/Nodes: Visualized thyroid is unremarkable. No pathologic thoracic adenopathy. Esophagus is unremarkable Lungs/Pleura: Lungs are clear. No pleural effusion or pneumothorax. Upper Abdomen: There is progressive marked extrahepatic biliary ductal dilation when compared to CT examination of 02/04/2021 with the extrahepatic bile duct now measuring up to 24 mm in diameter. This is not well assessed on this examination. Simple cortical cyst again noted within the visualized right kidney, incompletely included on this exam. No acute abnormality. Musculoskeletal: No lytic or blastic bone lesion. No acute bone abnormality. Review of the MIP images confirms the above findings. IMPRESSION: No pulmonary embolism.  No acute intrathoracic pathology identified. Morphologic changes in keeping with pulmonary arterial hypertension. Mild global cardiomegaly. Progressive dilation of the extrahepatic bile duct, now measuring up to 24 mm in diameter, enlarged since CT examination of the abdomen pelvis of 02/04/2021. Correlation with liver enzymes is recommended. ERCP or MRCP examination may be more helpful to assess for distal obstructing process. Aortic Atherosclerosis (ICD10-I70.0). Electronically Signed   By: AFidela SalisburyM.D.   On: 07/03/2021 20:50    Procedures Procedures   Medications Ordered in ED Medications  iohexol (OMNIPAQUE) 350 MG/ML injection 100 mL (  100 mLs Intravenous Contrast Given 07/03/21 2025)    ED Course  I have reviewed the triage vital signs and the nursing notes.  Pertinent labs & imaging results that were  available during my care of the patient were reviewed by me and considered in my medical decision making (see chart for details).    MDM Rules/Calculators/A&P                           Patient is a 86 year old female who presents with shortness of breath.  She had recent knee surgery this so there was some concern for PE.  She had a CT angio of her chest which showed no evidence of PE.  No evidence of pneumonia or pulmonary edema.  She has no hypoxia.  No ongoing symptoms.  There is some dilatation of her common bile duct which is chronic but enlarged from her prior CTs.  Her LFTs are normal.  She has no associate abdominal tenderness.  She has some chest tightness but she says that is been going on for months and is intermittent and unchanged.  She had 2 negative troponins.  No ischemic changes on EKG. her knee has some mild swelling or redness but she says it is relatively unchanged since the surgery.  She has an appointment with her orthopedist on Monday.  She is following up with Dr. Tamala Julian her cardiologist regarding this.  Also advised her to follow-up with Dr. Ernestine Conrad regarding her common bile duct dilatation. Final Clinical Impression(s) / ED Diagnoses Final diagnoses:  Shortness of breath    Rx / DC Orders ED Discharge Orders     None        Malvin Johns, MD 07/03/21 2226    Malvin Johns, MD 07/03/21 2227

## 2021-07-03 NOTE — ED Notes (Signed)
Dr. Tamera Punt informed of patient's BP 203/87. No new orders received.

## 2021-07-03 NOTE — ED Triage Notes (Addendum)
Pt c/o CP, SOB, night sweats x 3-4 days-states she spoke with Dr Smith/cards and was advised to come to ED to r/o PE-states she had surgery to left knee 8/9-NAD-slow gait with own cane-later added she was seen by PCP yesterday

## 2021-07-06 DIAGNOSIS — R600 Localized edema: Secondary | ICD-10-CM | POA: Diagnosis not present

## 2021-07-06 DIAGNOSIS — R531 Weakness: Secondary | ICD-10-CM | POA: Diagnosis not present

## 2021-07-06 DIAGNOSIS — Z4789 Encounter for other orthopedic aftercare: Secondary | ICD-10-CM | POA: Diagnosis not present

## 2021-07-06 DIAGNOSIS — M25562 Pain in left knee: Secondary | ICD-10-CM | POA: Diagnosis not present

## 2021-07-06 DIAGNOSIS — M1712 Unilateral primary osteoarthritis, left knee: Secondary | ICD-10-CM | POA: Diagnosis not present

## 2021-07-06 DIAGNOSIS — R262 Difficulty in walking, not elsewhere classified: Secondary | ICD-10-CM | POA: Diagnosis not present

## 2021-07-06 DIAGNOSIS — Z96652 Presence of left artificial knee joint: Secondary | ICD-10-CM | POA: Diagnosis not present

## 2021-07-08 DIAGNOSIS — R262 Difficulty in walking, not elsewhere classified: Secondary | ICD-10-CM | POA: Diagnosis not present

## 2021-07-08 DIAGNOSIS — Z4789 Encounter for other orthopedic aftercare: Secondary | ICD-10-CM | POA: Diagnosis not present

## 2021-07-08 DIAGNOSIS — R600 Localized edema: Secondary | ICD-10-CM | POA: Diagnosis not present

## 2021-07-08 DIAGNOSIS — Z96652 Presence of left artificial knee joint: Secondary | ICD-10-CM | POA: Diagnosis not present

## 2021-07-08 DIAGNOSIS — M25562 Pain in left knee: Secondary | ICD-10-CM | POA: Diagnosis not present

## 2021-07-08 DIAGNOSIS — R531 Weakness: Secondary | ICD-10-CM | POA: Diagnosis not present

## 2021-07-14 ENCOUNTER — Telehealth: Payer: Self-pay

## 2021-07-14 DIAGNOSIS — Z96652 Presence of left artificial knee joint: Secondary | ICD-10-CM | POA: Diagnosis not present

## 2021-07-14 DIAGNOSIS — R531 Weakness: Secondary | ICD-10-CM | POA: Diagnosis not present

## 2021-07-14 DIAGNOSIS — Z4789 Encounter for other orthopedic aftercare: Secondary | ICD-10-CM | POA: Diagnosis not present

## 2021-07-14 DIAGNOSIS — R600 Localized edema: Secondary | ICD-10-CM | POA: Diagnosis not present

## 2021-07-14 DIAGNOSIS — M25562 Pain in left knee: Secondary | ICD-10-CM | POA: Diagnosis not present

## 2021-07-14 DIAGNOSIS — R262 Difficulty in walking, not elsewhere classified: Secondary | ICD-10-CM | POA: Diagnosis not present

## 2021-07-14 NOTE — Telephone Encounter (Signed)
Pt was last seen 07/02/21, advised to return for 2-3 wk f/u neuropathic pain. Take gabapentin 300mg  only at night x 3 nights, then start taking it every morning and every night until you see me again.      Please review and advise

## 2021-07-14 NOTE — Telephone Encounter (Signed)
Patient states that 300 mg of Gabapentin makes her feel very groggy, sleepy and sometimes finds her sleeping too much. Patient would like to decrease back to 100 mg OR whatever Dr. Anitra Lauth suggests for her. She is aware he is out of office until tomorrow.  Please call [phone].  Okay to leave message if she doesn't answer.

## 2021-07-15 MED ORDER — GABAPENTIN 100 MG PO CAPS: 100.0000 mg | ORAL_CAPSULE | Freq: Three times a day (TID) | ORAL | 3 refills | Status: DC

## 2021-07-15 NOTE — Telephone Encounter (Signed)
Spoke with patient regarding results/recommendations.  

## 2021-07-15 NOTE — Telephone Encounter (Signed)
OK, new rx for gabapentin 100mg  tabs eRx'd: take 1 tab three times a day.

## 2021-07-16 DIAGNOSIS — M25562 Pain in left knee: Secondary | ICD-10-CM | POA: Diagnosis not present

## 2021-07-16 DIAGNOSIS — R600 Localized edema: Secondary | ICD-10-CM | POA: Diagnosis not present

## 2021-07-16 DIAGNOSIS — R531 Weakness: Secondary | ICD-10-CM | POA: Diagnosis not present

## 2021-07-16 DIAGNOSIS — R262 Difficulty in walking, not elsewhere classified: Secondary | ICD-10-CM | POA: Diagnosis not present

## 2021-07-16 DIAGNOSIS — Z4789 Encounter for other orthopedic aftercare: Secondary | ICD-10-CM | POA: Diagnosis not present

## 2021-07-16 DIAGNOSIS — Z96652 Presence of left artificial knee joint: Secondary | ICD-10-CM | POA: Diagnosis not present

## 2021-07-19 ENCOUNTER — Ambulatory Visit (INDEPENDENT_AMBULATORY_CARE_PROVIDER_SITE_OTHER): Payer: Medicare Other

## 2021-07-19 DIAGNOSIS — Z0000 Encounter for general adult medical examination without abnormal findings: Secondary | ICD-10-CM | POA: Diagnosis not present

## 2021-07-19 DIAGNOSIS — Z13820 Encounter for screening for osteoporosis: Secondary | ICD-10-CM | POA: Diagnosis not present

## 2021-07-19 NOTE — Progress Notes (Addendum)
Subjective:   Erin [name] is a 86 y.o. female who presents for Medicare Annual (Subsequent) preventive examination.  I connected with  [name] on 07/19/21 by an audio only telemedicine application and verified that I am speaking with the correct person using two identifiers.   I discussed the limitations, risks, security and privacy concerns of performing an evaluation and management service by telephone and the availability of in person appointments. I also discussed with the patient that there may be a patient responsible charge related to this service. The patient expressed understanding and verbally consented to this telephonic visit.  Location of Patient: Home Location of Provider: Office  List any persons and their role that are participating in the visit with the patient.   Review of Systems    Defer to PCP       Objective:    There were no vitals filed for this visit. There is no height or weight on file to calculate BMI.  Advanced Directives 07/03/2021 05/22/2021 11/07/2020 11/04/2020 11/04/2020 09/16/2020  Does Patient Have a Medical Advance Directive? No No No No No No  Would patient like information on creating a medical advance directive? - Yes (Inpatient - patient defers creating a medical advance directive and declines information at this time) No - Patient declined Yes (MAU/Ambulatory/Procedural Areas - Information given) No - Patient declined Yes (MAU/Ambulatory/Procedural Areas - Information given)    Current Medications (verified) Outpatient Encounter Medications as of 07/19/2021  Medication Sig   amoxicillin (AMOXIL) 500 MG tablet Take 4 tablets (2,000 mg) one hour prior to all dental visits. (Patient not taking: Reported on 07/02/2021)   Ascorbic Acid (VITAMIN C PO) Take 1,000 mg by mouth daily.   aspirin EC 81 MG tablet 1 tablet twice a day for 30 days to prevent blood clots after surgery   carvedilol (COREG) 25 MG tablet Take 1 tablet (25 mg total) by  mouth daily.   cholecalciferol (VITAMIN D) 25 MCG (1000 UNIT) tablet Take 1,000 Units by mouth daily.   diazepam (VALIUM) 2 MG tablet Take 2 mg by mouth daily as needed for anxiety.   docusate sodium (COLACE) 100 MG capsule 1 tablet twice a day while on narcotics to prevent constipation (Patient not taking: Reported on 07/02/2021)   furosemide (LASIX) 20 MG tablet Take 1 tablet (20 mg total) by mouth every Monday, Wednesday, and Friday. (Patient not taking: Reported on 07/02/2021)   gabapentin (NEURONTIN) 100 MG capsule Take 1 capsule (100 mg total) by mouth 3 (three) times daily.   loratadine (CLARITIN) 10 MG tablet TAKE ONE TABLET BY MOUTH EVERY DAY AS NEEDED FOR ALLERGY (Patient taking differently: Take 10 mg by mouth daily as needed for allergies.)   Multiple Vitamin (MULTIVITAMIN WITH MINERALS) TABS tablet Take 1 tablet by mouth daily.   Multiple Vitamins-Minerals (PRESERVISION AREDS PO) Take 1 tablet by mouth in the morning and at bedtime.    Multiple Vitamins-Minerals (ZINC PO) Take 50 mg by mouth daily.   nitroGLYCERIN (NITRODUR - DOSED IN MG/24 HR) 0.2 mg/hr patch 1/2 patch over L Achilles tendon bid (Patient not taking: Reported on 07/02/2021)   ondansetron (ZOFRAN) 4 MG tablet Take 1 tablet (4 mg total) by mouth every 8 (eight) hours as needed for nausea or vomiting. (Patient not taking: Reported on 07/02/2021)   polyethylene glycol (MIRALAX) 17 g packet Take 17 g by mouth 2 (two) times daily. 17 grams in 6 oz of favorite drink twice a day until bowel movement.  LAXITIVE.  Restart if two days since last bowel movement   potassium chloride (KLOR-CON) 10 MEQ tablet Take 10 mEq by mouth daily as needed (when taking lasix). (Patient not taking: Reported on 07/02/2021)   sodium chloride (MURO 128) 5 % ophthalmic solution Place 1 drop into both eyes at bedtime.   vitamin E 180 MG (400 UNITS) capsule Take 400 Units by mouth daily.   No facility-administered encounter medications on file as of 07/19/2021.     Allergies (verified) Barbiturates, Demerol, Hydrochlorothiazide, Irbesartan, Losartan potassium, Olmesartan medoxomil, and Telmisartan   History: Past Medical History:  Diagnosis Date   Achilles tendon rupture    left, no surgery required   Chronic diastolic heart failure ([place])    Gross hematuria 12/2020   Klebsiella on urine clx x2 but no UTI sx's.  CT showed tiny stones in each renal pelvis but no ureteral or bladder stones, no mass.  Urol did cysto->normal. Plan is annual UA, rpt hematuria w/u in 3-5 yrs if persistent pos.   History of kidney stones    Hypertension    Hypertensive retinopathy of both eyes    Dr. Herbert Deaner   Low back pain    spondylosis, listhesis, +scoliosis at L/S jxn   Lower extremity edema    lasix prn   OA (osteoarthritis)    Osteopenia    S/P mitral valve clip implantation 11/06/2020   s/p TEER with one XTW MitraClip on A2P2 by Dr. Burt Knack.  DAPT w/ASA and plavix x 3 mo post procedure recommended   Severe mitral regurgitation    2021 transthoracic echo and TEE->cardiology following.  Mitral valve clip 10/2020.  f/u echo 10/28/20 mild/mod MVR and tricusp regurg   Severe pulmonary hypertension ([place]) 2021   transth echo; moderate by TEE 93/2355   Systolic murmur 73/22/0254   severe mitral regurge, mod/severe tricuspic regurg   Past Surgical History:  Procedure Laterality Date   APPENDECTOMY     86 yrs old   [place]  2004   ruptured disc   CHOLECYSTECTOMY     age 39   CYSTOSCOPY  02/24/2021   (for gross hematuria) ->normal.   LUMBAR LAMINECTOMY  2004   MITRAL VALVE REPAIR N/A 11/06/2020   Procedure: MITRAL VALVE REPAIR;  Surgeon: Sherren Mocha, MD;  Location: [place] [facility];  Service: Cardiovascular;  Laterality: N/A;   RIGHT/LEFT HEART CATH AND CORONARY ANGIOGRAPHY N/A 09/16/2020   No CAD. Procedure: RIGHT/LEFT HEART CATH AND CORONARY ANGIOGRAPHY;  Surgeon: Belva Crome, MD;  Location: [place] [facility];  Service: Cardiovascular;   Laterality: N/A;   TEE WITHOUT CARDIOVERSION N/A 09/16/2020   Procedure: TRANSESOPHAGEAL ECHOCARDIOGRAM (TEE);  Surgeon: Buford Dresser, MD;  Location: [facility];  Service: Cardiovascular;  Laterality: N/A;   TEE WITHOUT CARDIOVERSION N/A 11/06/2020   Procedure: TRANSESOPHAGEAL ECHOCARDIOGRAM (TEE);  Surgeon: Sherren Mocha, MD;  Location: [facility];  Service: Cardiovascular;  Laterality: N/A;   TONSILLECTOMY     age 80   TOTAL KNEE ARTHROPLASTY Left 06/02/2021   Procedure: LEFT TOTAL KNEE ARTHROPLASTY;  Surgeon: Renette Butters, MD;  Location: [facility];  Service: Orthopedics;  Laterality: Left;   TRANSTHORACIC ECHOCARDIOGRAM  07/03/2020; 10/28/20; 12/03/20   06/2020 TTE EF 60-65%, grd II DD, severe pulm art HTN, severe mitral valve regurg, mod/sev tricuspic valve regurg. Conf on TEE 08/2020.  10/28/20 (s/p MV clip procedure) mild-mod MVR, EF 55-60%. 11/2020 EF 60%, mild/mod MR w/mean grad 9mm hg and mod to sev TR.   Family  History  Problem Relation Age of Onset   Hypertension Mother    Early death Father    Cancer Father    Hypertension Other    Social History   Socioeconomic History   Marital status: Married    Spouse name: Not on file   Number of children: Not on file   Years of education: Not on file   Highest education level: Not on file  Occupational History   Not on file  Tobacco Use   Smoking status: Never   Smokeless tobacco: Never  Vaping Use   Vaping Use: Never used  Substance and Sexual Activity   Alcohol use: No   Drug use: Never   Sexual activity: Not Currently  Other Topics Concern   Not on file  Social History Narrative   Married, 6 children, about 35 GC.  8 [place]   Former Network engineer at [employer].  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   No tob.   No alc.   Social Determinants of Health   Financial Resource Strain: Not on file  Food Insecurity: Not on file  Transportation Needs: Not on file  Physical Activity:  Not on file  Stress: Not on file  Social Connections: Not on file    Tobacco Counseling Counseling given: Not Answered   Clinical Intake:                 Diabetic?No         Activities of Daily Living In your present state of health, do you have any difficulty performing the following activities: 05/22/2021 11/07/2020  Hearing? N -  Vision? N -  Difficulty concentrating or making decisions? N -  Walking or climbing stairs? N -  Dressing or bathing? N -  Doing errands, shopping? N N  Some recent data might be hidden    Patient Care Team: Tammi Sou, MD as PCP - General (Family Medicine) Belva Crome, MD as PCP - Cardiology (Cardiology) Kristeen Miss, MD as Consulting Physician (Neurosurgery) Renette Butters, MD as Consulting Physician (Orthopedic Surgery) Tanda Rockers, MD as Consulting Physician (Pulmonary Disease) Monna Fam, MD as Consulting Physician (Ophthalmology) Belva Crome, MD as Consulting Physician (Cardiology) Sherren Mocha, MD as Consulting Physician (Cardiology) Janith Lima, MD as Consulting Physician (Urology)  Indicate any recent Medical Services you may have received from other than [facility] providers in the past year (date may be approximate).     Assessment:   This is a routine wellness examination for Erin [name].  Hearing/Vision screen No results found.  Dietary issues and exercise activities discussed:     Goals Addressed   None   Depression Screen PHQ 2/9 Scores 01/14/2021 07/02/2020 01/11/2018 09/22/2016  PHQ - 2 Score 0 0 0 0    Fall Risk Fall Risk  01/14/2021 07/02/2020 01/11/2018 09/22/2016  Falls in the past year? 0 0 No No  Number falls in past yr: 0 0 - -  Injury with Fall? 0 0 - -  Follow up Falls evaluation completed Falls evaluation completed - -    FALL RISK PREVENTION PERTAINING TO THE HOME:  Any stairs in or around the home? Yes  If so, are there any without handrails? No  Home free of loose  throw rugs in walkways, pet beds, electrical cords, etc? Yes  Adequate lighting in your home to reduce risk of falls? Yes   ASSISTIVE DEVICES UTILIZED TO PREVENT FALLS:  Life alert? No  Use of a cane, walker or w/c? Yes  Grab bars in the bathroom? No  Shower chair or bench in shower? No  Elevated toilet seat or a handicapped toilet? Yes   TIMED UP AND GO:  Was the test performed? No .  Length of time to ambulate 10 feet: n/a sec.    Cognitive Function:        Immunizations Immunization History  Administered Date(s) Administered   Pneumococcal Conjugate-13 03/24/2017   Pneumococcal Polysaccharide-23 05/21/2010   Td 10/11/2013    TDAP status: Up to date  Flu Vaccine status: Declined, Education has been provided regarding the importance of this vaccine but patient still declined. Advised may receive this vaccine at local pharmacy or Health Dept. Aware to provide a copy of the vaccination record if obtained from local pharmacy or Health Dept. Verbalized acceptance and understanding.  Pneumococcal vaccine status: Up to date  Covid-19 vaccine status: Declined, Education has been provided regarding the importance of this vaccine but patient still declined. Advised may receive this vaccine at local pharmacy or Health Dept.or vaccine clinic. Aware to provide a copy of the vaccination record if obtained from local pharmacy or Health Dept. Verbalized acceptance and understanding.  Qualifies for Shingles Vaccine? Yes   Zostavax completed No   Shingrix Completed?: No.    Education has been provided regarding the importance of this vaccine. Patient has been advised to call insurance company to determine out of pocket expense if they have not yet received this vaccine. Advised may also receive vaccine at local pharmacy or Health Dept. Verbalized acceptance and understanding.  Screening Tests Health Maintenance  Topic Date Due   COVID-19 Vaccine (1) Never done   Zoster Vaccines-  Shingrix (1 of 2) Never done   DEXA SCAN  Never done   TETANUS/TDAP  10/12/2023   HPV VACCINES  Aged Out    Health Maintenance  Health Maintenance Due  Topic Date Due   COVID-19 Vaccine (1) Never done   Zoster Vaccines- Shingrix (1 of 2) Never done   DEXA SCAN  Never done    Colorectal cancer screening: No longer required.   Mammogram status: No longer required due to age.  Bone Density status: Ordered n/a. Pt provided with contact info and advised to call to schedule appt.  Lung Cancer Screening: (Low Dose CT Chest recommended if Age 45-80 years, 30 pack-year currently smoking OR have quit w/in 15years.) does not qualify.   Lung Cancer Screening Referral: n/a  Additional Screening:  Hepatitis C Screening: does not qualify; Completed n/a  Vision Screening: Recommended annual ophthalmology exams for early detection of glaucoma and other disorders of the eye. Is the patient up to date with their annual eye exam?  Yes  Who is the provider or what is the name of the office in which the patient attends annual eye exams? Dr. Herbert Deaner If pt is not established with a provider, would they like to be referred to a provider to establish care?  N/a .   Dental Screening: Recommended annual dental exams for proper oral hygiene  Community Resource Referral / Chronic Care Management: CRR required this visit?  No   CCM required this visit?  No      Plan:     I have personally reviewed and noted the following in the patient's chart:   Medical and social history Use of alcohol, tobacco or illicit drugs  Current medications and supplements including opioid prescriptions.  Functional ability and status  Nutritional status Physical activity Advanced directives List of other physicians Hospitalizations, surgeries, and ER visits in previous 12 months Vitals Screenings to include cognitive, depression, and falls Referrals and appointments  In addition, I have reviewed and discussed  with patient certain preventive protocols, quality metrics, and best practice recommendations. A written personalized care plan for preventive services as well as general preventive health recommendations were provided to patient.     Kavin Leech, [facility]   07/19/2021   Nurse Notes: Non face to face time 20 minutes.  Erin [name] , Thank you for taking time to come for your Medicare Wellness Visit. I appreciate your ongoing commitment to your health goals. Please review the following plan we discussed and let me know if I can assist you in the future.   These are the goals we discussed:  Goals   None     This is a list of the screening recommended for you and due dates:  Health Maintenance  Topic Date Due   COVID-19 Vaccine (1) Never done   Zoster (Shingles) Vaccine (1 of 2) Never done   DEXA scan (bone density measurement)  Never done   Tetanus Vaccine  10/12/2023   HPV Vaccine  Aged Out

## 2021-07-22 ENCOUNTER — Other Ambulatory Visit: Payer: Self-pay

## 2021-07-22 ENCOUNTER — Encounter: Payer: Self-pay | Admitting: Family Medicine

## 2021-07-22 ENCOUNTER — Ambulatory Visit (INDEPENDENT_AMBULATORY_CARE_PROVIDER_SITE_OTHER): Payer: Medicare Other | Admitting: Family Medicine

## 2021-07-22 VITALS — BP 161/75 | HR 62 | Temp 98.0°F | Ht 62.0 in | Wt 120.0 lb

## 2021-07-22 DIAGNOSIS — R251 Tremor, unspecified: Secondary | ICD-10-CM

## 2021-07-22 DIAGNOSIS — E2839 Other primary ovarian failure: Secondary | ICD-10-CM

## 2021-07-22 DIAGNOSIS — G8929 Other chronic pain: Secondary | ICD-10-CM | POA: Diagnosis not present

## 2021-07-22 DIAGNOSIS — Z96652 Presence of left artificial knee joint: Secondary | ICD-10-CM | POA: Diagnosis not present

## 2021-07-22 DIAGNOSIS — M25562 Pain in left knee: Secondary | ICD-10-CM

## 2021-07-22 DIAGNOSIS — K838 Other specified diseases of biliary tract: Secondary | ICD-10-CM | POA: Diagnosis not present

## 2021-07-22 LAB — COMPREHENSIVE METABOLIC PANEL
ALT: 6 U/L (ref 0–35)
AST: 12 U/L (ref 0–37)
Albumin: 4 g/dL (ref 3.5–5.2)
Alkaline Phosphatase: 69 U/L (ref 39–117)
BUN: 21 mg/dL (ref 6–23)
CO2: 30 mEq/L (ref 19–32)
Calcium: 9.2 mg/dL (ref 8.4–10.5)
Chloride: 104 mEq/L (ref 96–112)
Creatinine, Ser: 0.58 mg/dL (ref 0.40–1.20)
GFR: 83.11 mL/min (ref >60.00)
Glucose, Bld: 79 mg/dL (ref 70–99)
Potassium: 4.3 mEq/L (ref 3.5–5.1)
Sodium: 143 mEq/L (ref 135–145)
Total Bilirubin: 0.7 mg/dL (ref 0.2–1.2)
Total Protein: 6.5 g/dL (ref 6.0–8.3)

## 2021-07-22 LAB — LIPASE: Lipase: 13 U/L (ref 11.0–59.0)

## 2021-07-22 MED ORDER — MELOXICAM 15 MG PO TABS: 15.0000 mg | ORAL_TABLET | Freq: Every day | ORAL | 1 refills | Status: DC

## 2021-07-22 NOTE — Progress Notes (Signed)
OFFICE VISIT  07/22/2021  CC:  Chief Complaint  Patient presents with   Hand tremors    Occurring more often at night, R hand   HPI:    Patient is a 86 y.o. Caucasian female who presents for hand tremors. I last saw her 3 wks ago. A/P as of that visit: "1) Neuropathic pain bilat feet.  Suspect idiopathic PN.   Unclear if she adequate trial of gabapentin so we'll start there-->370m bid and I'll inc in 2-3 wks if needed.   2) Night sweats, malaise: with her relatively recent knee arthroplasty and hx of mitral valve clip I want to keep endocarditis in mind.  Will check cbc w/diff, cmet, esr, crp today.   3) L knee swelling and warmth. This is over and above what I would expect for being 1 month out from knee replacement. Unclear from pt how long it has looked/felt like it does. We've contacted her orthopedist and moved her f/u appt to this coming Monday the 12th (Dr. MPercell Miller. CBC w/diff, esr, crp"  INTERIM HX: Gabapentin caused too much drowsiness, even at 1094mdosing.   She presented to the ED 07/03/21 for SOB/DOE. Reviewed this encounter/data today. Labs showed d dimer elevated and the rest all normal including negative and flat troponins. CT angio chest neg for PE or other acute finding. This did pick up "progressive marked dilation of extrahepatic bile duct now measuring 24 mm diameter.  This is not well assessed on this examination". This is enlarged since CT abd/pelv 02/04/21 (it had apparently been stable dating back to 2003).  Radiologist recommended correlation with LFTs and consider ERCP or MRCP exam to further assess for distal obstructing process. LFTs normal 07/02/21.  No lipase level has been checked.  She DOES still have a gallbladder.  CURRENT problem: About 18 mo hx of progressive R hand/arm tremor.  Worse when sitting still and esp at night when trying to rest.  No tremor when holding thins such as silverware or cup.  Nothing she knows of makes it better or worse.   No FH of tremor.  No FH parkinson's. No tremor anywhere else.    No further probs with SOB/DOE or fevers/nightsweats.  Chronic L knee pain d/t arthritis not improved since having relatively recent L TKA. She is in PT, has taken ibup with a little improvement.  Aleve and tylenol no help. She wants to avoid opioids.  ROS as above, plus-->  no wheezing, no cough, no dizziness, no HAs, no rashes, no melena/hematochezia.  No polyuria or polydipsia.  No myalgias..  No focal weakness, no paresthesias.  No acute vision or hearing abnormalities.  No dysuria or unusual/new urinary urgency or frequency.  No recent changes in lower legs. No n/v/d or abd pain.  No palpitations.     Past Medical History:  Diagnosis Date   Achilles tendon rupture    left, no surgery required   [place] heart [place]   Gross hematuria 12/2020   Klebsiella on urine clx x2 but no UTI sx's.  CT showed tiny stones in each renal pelvis but no ureteral or bladder stones, no mass.  Urol did cysto->normal. Plan is annual UA, rpt hematuria w/u in 3-5 yrs if persistent pos.   History of kidney stones    Hypertension    Hypertensive retinopathy of both eyes    Dr. heHerbert Deaner Low back pain    spondylosis, listhesis, +scoliosis at L/S jxn   Lower extremity edema  lasix prn   OA (osteoarthritis)    Osteopenia    S/P mitral valve clip implantation 11/06/2020   s/p TEER with one XTW MitraClip on A2P2 by Dr. Burt Knack.  DAPT w/ASA and plavix x 3 mo post procedure recommended   Severe mitral regurgitation    2021 transthoracic echo and TEE->cardiology following.  Mitral valve clip 10/2020.  f/u echo 10/28/20 mild/mod MVR and tricusp regurg   Severe pulmonary hypertension ([place]) 2021   transth echo; moderate by TEE 88/5027   Systolic murmur 74/09/8785   severe mitral regurge, mod/severe tricuspic regurg    Past Surgical History:  Procedure Laterality Date   APPENDECTOMY     86 yrs old   [place]  2004    ruptured disc   CHOLECYSTECTOMY     age 26   CYSTOSCOPY  02/24/2021   (for gross hematuria) ->normal.   LUMBAR LAMINECTOMY  2004   MITRAL VALVE REPAIR N/A 11/06/2020   Procedure: MITRAL VALVE REPAIR;  Surgeon: Sherren Mocha, MD;  Location: [place] [facility];  Service: Cardiovascular;  Laterality: N/A;   RIGHT/LEFT HEART CATH AND CORONARY ANGIOGRAPHY N/A 09/16/2020   No CAD. Procedure: RIGHT/LEFT HEART CATH AND CORONARY ANGIOGRAPHY;  Surgeon: Belva Crome, MD;  Location: [place] [facility];  Service: Cardiovascular;  Laterality: N/A;   TEE WITHOUT CARDIOVERSION N/A 09/16/2020   Procedure: TRANSESOPHAGEAL ECHOCARDIOGRAM (TEE);  Surgeon: Buford Dresser, MD;  Location: [facility] END[facility];  Service: Cardiovascular;  Laterality: N/A;   TEE WITHOUT CARDIOVERSION N/A 11/06/2020   Procedure: TRANSESOPHAGEAL ECHOCARDIOGRAM (TEE);  Surgeon: Sherren Mocha, MD;  Location: [place] [facility];  Service: Cardiovascular;  Laterality: N/A;   TONSILLECTOMY     age 74   TOTAL KNEE ARTHROPLASTY Left 06/02/2021   Procedure: LEFT TOTAL KNEE ARTHROPLASTY;  Surgeon: Renette Butters, MD;  Location: [facility];  Service: Orthopedics;  Laterality: Left;   TRANSTHORACIC ECHOCARDIOGRAM  07/03/2020; 10/28/20; 12/03/20   06/2020 TTE EF 60-65%, grd II DD, severe pulm art HTN, severe mitral valve regurg, mod/sev tricuspic valve regurg. Conf on TEE 08/2020.  10/28/20 (s/p MV clip procedure) mild-mod MVR, EF 55-60%. 11/2020 EF 60%, mild/mod MR w/mean grad 49m hg and mod to sev TR.    Outpatient Medications Prior to Visit  Medication Sig Dispense Refill   Ascorbic Acid (VITAMIN C PO) Take 1,000 mg by mouth daily.     carvedilol (COREG) 25 MG tablet Take 1 tablet (25 mg total) by mouth daily. 90 tablet 3   cholecalciferol (VITAMIN D) 25 MCG (1000 UNIT) tablet Take 1,000 Units by mouth daily.     diazepam (VALIUM) 2 MG tablet Take 2 mg by mouth daily as needed for anxiety.     loratadine (CLARITIN) 10 MG tablet TAKE ONE TABLET  BY MOUTH EVERY DAY AS NEEDED FOR ALLERGY (Patient taking differently: Take 10 mg by mouth daily as needed for allergies.) 90 tablet 3   Multiple Vitamin (MULTIVITAMIN WITH MINERALS) TABS tablet Take 1 tablet by mouth daily.     Multiple Vitamins-Minerals (PRESERVISION AREDS PO) Take 1 tablet by mouth in the morning and at bedtime.      Multiple Vitamins-Minerals (ZINC PO) Take 50 mg by mouth daily.     sodium chloride (MURO 128) 5 % ophthalmic solution Place 1 drop into both eyes at bedtime.     vitamin E 180 MG (400 UNITS) capsule Take 400 Units by mouth daily.     amoxicillin (AMOXIL) 500 MG tablet Take 4 tablets (2,000 mg) one  hour prior to all dental visits. (Patient not taking: Reported on 07/02/2021) 8 tablet 11   docusate sodium (COLACE) 100 MG capsule 1 tablet twice a day while on narcotics to prevent constipation (Patient not taking: No sig reported) 60 capsule 2   furosemide (LASIX) 20 MG tablet Take 1 tablet (20 mg total) by mouth every Monday, Wednesday, and Friday. (Patient not taking: Reported on 07/02/2021) 36 tablet 0   gabapentin (NEURONTIN) 100 MG capsule Take 1 capsule (100 mg total) by mouth 3 (three) times daily. (Patient not taking: Reported on 07/22/2021) 90 capsule 3   nitroGLYCERIN (NITRODUR - DOSED IN MG/24 HR) 0.2 mg/hr patch 1/2 patch over L Achilles tendon bid (Patient not taking: No sig reported) 30 patch 12   ondansetron (ZOFRAN) 4 MG tablet Take 1 tablet (4 mg total) by mouth every 8 (eight) hours as needed for nausea or vomiting. (Patient not taking: No sig reported) 30 tablet 0   potassium chloride (KLOR-CON) 10 MEQ tablet Take 10 mEq by mouth daily as needed (when taking lasix). (Patient not taking: Reported on 07/02/2021)     aspirin EC 81 MG tablet 1 tablet twice a day for 30 days to prevent blood clots after surgery (Patient not taking: Reported on 07/22/2021) 60 tablet 0   polyethylene glycol (MIRALAX) 17 g packet Take 17 g by mouth 2 (two) times daily. 17 grams in 6 oz  of favorite drink twice a day until bowel movement.  LAXITIVE.  Restart if two days since last bowel movement (Patient not taking: Reported on 07/22/2021) 14 packet 0   No facility-administered medications prior to visit.    Allergies  Allergen Reactions   Barbiturates Nausea And Vomiting   Demerol Other (See Comments)    sick   Hydrochlorothiazide Other (See Comments)    unknown   Irbesartan     HA   Losartan Potassium     hair loss   Olmesartan Medoxomil     hair loss   Telmisartan     cramps    ROS As per HPI  PE: Vitals with BMI 07/22/2021 07/03/2021 07/03/2021  Height _0  - -  Weight 120 lbs - -  BMI 45.40 - -  Systolic 981 191 478  Diastolic 75 89 64  Pulse 62 76 71     Gen: Alert, well appearing.  Patient is oriented to person, place, time, and situation. AFFECT: pleasant, lucid thought and speech. CV: RRR, 2-9/5 systolic murmur, no diastolic murmur.  No r/g.   LUNGS: CTA bilat, nonlabored resps, good aeration in all lung fields. ABD: soft, NT, ND, BS normal.  No hepatospenomegaly or mass.  No bruits. EXT: no clubbing or cyanosis.  no edema.  Left knee w/out erythema, warmth, or acute effusion.   Neuro: CN 2-12 intact bilaterally, strength 5/5 in proximal and distal upper extremities and lower extremities bilaterally.  No sensory deficits.  No tremor.  No disdiadochokinesis.  No ataxia.  Upper extremity and lower extremity DTRs symmetric.  No pronator drift. No shuffling gait.  No cogwheel rigidity.  Normal facial expression variation.  LABS:  Lab Results  Component Value Date   TSH 2.37 07/02/2020   Lab Results  Component Value Date   WBC 6.7 07/03/2021   HGB 12.4 07/03/2021   HCT 38.9 07/03/2021   MCV 91.5 07/03/2021   PLT 308 07/03/2021   Lab Results  Component Value Date   CREATININE 0.63 07/03/2021   BUN 20 07/03/2021   NA 140 07/03/2021  K 3.6 07/03/2021   CL 105 07/03/2021   CO2 29 07/03/2021   Lab Results  Component Value Date   ALT 9  07/02/2021   AST 13 07/02/2021   ALKPHOS 82 07/02/2021   BILITOT 0.8 07/02/2021   Lab Results  Component Value Date   CHOL 139 07/02/2020   Lab Results  Component Value Date   HDL 80.30 07/02/2020   Lab Results  Component Value Date   LDLCALC 53 07/02/2020   Lab Results  Component Value Date   TRIG 30.0 07/02/2020   Lab Results  Component Value Date   CHOLHDL 2 07/02/2020   Lab Results  Component Value Date   ESRSEDRATE 12 07/02/2021   Lab Results  Component Value Date   CRP <1.0 07/02/2021   Lab Results  Component Value Date   DDIMER 3.74 (H) 07/03/2021   IMPRESSION AND PLAN:  1) Peripheral neuropathy pain: pt cannot tolerate gabapentin. We decided on no further med trial for this problem at this time.  2) R hand tremor: 18 mo hx, pt says progressing. I am yet to observe this tremor when she is in the office.  Entire neurol exam normal. Reassured her but we decided further eval with neurologist is best next step to see if any further w/u is indicated.  3) Left knee osteoarthritis pain in pt with hx of L TKA about 7 weeks ago. Cont PT. Will see if she gets any benefit from meloxicam 26m qd. Advised her to not take any otc pain meds.  4) Progressive extrahepatic bile duct dilatation.  Pt does still have gallbladder. She is asymptomatic.  No abnl wt loss. Hepatic panel has been normal. Rpt hepatic panel today and check lipase as well. Will refer to GI.  No imaging ordered today.  5) Osteoporosis screening. Pt is a high fall risk. Screening DEXA ordered. She takes vit D daily and I recommended she start 6076mcalcium bid.  An After Visit Summary was printed and given to the patient.  FOLLOW UP: Return in about 4 weeks (around 08/19/2021) for f/u knee pain/nsaid.  Signed:  PhCrissie SicklesMD           07/22/2021

## 2021-07-25 DIAGNOSIS — M810 Age-related osteoporosis without current pathological fracture: Secondary | ICD-10-CM

## 2021-07-25 HISTORY — PX: OTHER SURGICAL HISTORY: SHX169

## 2021-07-25 HISTORY — DX: Age-related osteoporosis without current pathological fracture: M81.0

## 2021-07-27 DIAGNOSIS — Z96652 Presence of left artificial knee joint: Secondary | ICD-10-CM | POA: Diagnosis not present

## 2021-07-27 DIAGNOSIS — R262 Difficulty in walking, not elsewhere classified: Secondary | ICD-10-CM | POA: Diagnosis not present

## 2021-07-27 DIAGNOSIS — M25562 Pain in left knee: Secondary | ICD-10-CM | POA: Diagnosis not present

## 2021-07-27 DIAGNOSIS — R531 Weakness: Secondary | ICD-10-CM | POA: Diagnosis not present

## 2021-07-27 DIAGNOSIS — Z4789 Encounter for other orthopedic aftercare: Secondary | ICD-10-CM | POA: Diagnosis not present

## 2021-07-27 DIAGNOSIS — R600 Localized edema: Secondary | ICD-10-CM | POA: Diagnosis not present

## 2021-07-28 ENCOUNTER — Encounter: Payer: Self-pay | Admitting: Neurology

## 2021-07-30 ENCOUNTER — Other Ambulatory Visit: Payer: Self-pay

## 2021-07-30 ENCOUNTER — Ambulatory Visit (HOSPITAL_BASED_OUTPATIENT_CLINIC_OR_DEPARTMENT_OTHER)
Admission: RE | Admit: 2021-07-30 | Discharge: 2021-07-30 | Disposition: A | Discharge status: Home / Self Care | Payer: Medicare Other | Source: Ambulatory Visit | Attending: Family Medicine | Admitting: Family Medicine

## 2021-07-30 DIAGNOSIS — Z4789 Encounter for other orthopedic aftercare: Secondary | ICD-10-CM | POA: Diagnosis not present

## 2021-07-30 DIAGNOSIS — R262 Difficulty in walking, not elsewhere classified: Secondary | ICD-10-CM | POA: Diagnosis not present

## 2021-07-30 DIAGNOSIS — E2839 Other primary ovarian failure: Secondary | ICD-10-CM | POA: Diagnosis not present

## 2021-07-30 DIAGNOSIS — Z96652 Presence of left artificial knee joint: Secondary | ICD-10-CM | POA: Diagnosis not present

## 2021-07-30 DIAGNOSIS — R600 Localized edema: Secondary | ICD-10-CM | POA: Diagnosis not present

## 2021-07-30 DIAGNOSIS — M25562 Pain in left knee: Secondary | ICD-10-CM | POA: Diagnosis not present

## 2021-07-30 DIAGNOSIS — R531 Weakness: Secondary | ICD-10-CM | POA: Diagnosis not present

## 2021-07-30 DIAGNOSIS — M810 Age-related osteoporosis without current pathological fracture: Secondary | ICD-10-CM | POA: Diagnosis not present

## 2021-08-03 DIAGNOSIS — M25562 Pain in left knee: Secondary | ICD-10-CM | POA: Diagnosis not present

## 2021-08-03 DIAGNOSIS — Z96652 Presence of left artificial knee joint: Secondary | ICD-10-CM | POA: Diagnosis not present

## 2021-08-03 DIAGNOSIS — R262 Difficulty in walking, not elsewhere classified: Secondary | ICD-10-CM | POA: Diagnosis not present

## 2021-08-03 DIAGNOSIS — R600 Localized edema: Secondary | ICD-10-CM | POA: Diagnosis not present

## 2021-08-03 DIAGNOSIS — R531 Weakness: Secondary | ICD-10-CM | POA: Diagnosis not present

## 2021-08-03 DIAGNOSIS — Z4789 Encounter for other orthopedic aftercare: Secondary | ICD-10-CM | POA: Diagnosis not present

## 2021-08-04 ENCOUNTER — Ambulatory Visit (INDEPENDENT_AMBULATORY_CARE_PROVIDER_SITE_OTHER): Payer: Medicare Other | Admitting: Gastroenterology

## 2021-08-04 ENCOUNTER — Encounter: Payer: Self-pay | Admitting: Gastroenterology

## 2021-08-04 ENCOUNTER — Other Ambulatory Visit: Payer: Self-pay

## 2021-08-04 VITALS — BP 150/72 | HR 58 | Ht 62.0 in | Wt 119.4 lb

## 2021-08-04 DIAGNOSIS — Z95818 Presence of other cardiac implants and grafts: Secondary | ICD-10-CM | POA: Diagnosis not present

## 2021-08-04 DIAGNOSIS — R1013 Epigastric pain: Secondary | ICD-10-CM | POA: Diagnosis not present

## 2021-08-04 DIAGNOSIS — Z791 Long term (current) use of non-steroidal anti-inflammatories (NSAID): Secondary | ICD-10-CM

## 2021-08-04 DIAGNOSIS — Z96652 Presence of left artificial knee joint: Secondary | ICD-10-CM | POA: Diagnosis not present

## 2021-08-04 DIAGNOSIS — K838 Other specified diseases of biliary tract: Secondary | ICD-10-CM | POA: Diagnosis not present

## 2021-08-04 DIAGNOSIS — Z9889 Other specified postprocedural states: Secondary | ICD-10-CM | POA: Diagnosis not present

## 2021-08-04 MED ORDER — OMEPRAZOLE 20 MG PO CPDR: 20.0000 mg | DELAYED_RELEASE_CAPSULE | Freq: Every day | ORAL | 3 refills | Status: DC

## 2021-08-04 NOTE — Progress Notes (Signed)
Chief Complaint: Epigastric pain, abdominal bloating, abnormal CT   Referring Provider:     Tammi Sou, MD   HPI:    Erin [name] is a 86 y.o. female referred to the Gastroenterology Clinic for evaluation of dilated bile duct incidentally noted on recent CTPA.  Was seen by [facility] on 07/02/2021 then in the ER on 07/03/2021 for evaluation of SOB/DOE. No abdominal pain, n/v/f/c at that time.  - Normal CMP, CMP, ESR, CRP, lipase - Negative troponin - Elevated D-dimer - CT angio negative for PE but n/f incidental finding of marked dilation of extrahepatic bile duct at 24 mm.  Recommend MRCP or ERCP.  Reviewed prior imaging, to include: - CT abdomen/pelvis on 02/04/2021: Normal liver and GB.  CBD 13 mm, but stable from 2003 (15 mm).  Normal pancreas, spleen.  Diverticulosis.   She does report having episodic epigastric pain. Sxs occur at random, and not a/w PO intake. Otherwise, no associated f/c/n/v/d/c, hematochezia, melena. No pain currently.  Good p.o. intake and appetite.  Did have left TKR in August and still taking pain medication (Vicodin) with most recent dose last evening. No opiates prior to knee surgery. Also taking Neurontin and Mobic.   Past Medical History:  Diagnosis Date   Achilles tendon rupture    left, no surgery required   Chronic diastolic heart failure ([place])    Gross hematuria 12/2020   Klebsiella on urine clx x2 but no UTI sx's.  CT showed tiny stones in each renal pelvis but no ureteral or bladder stones, no mass.  Urol did cysto->normal. Plan is annual UA, rpt hematuria w/u in 3-5 yrs if persistent pos.   History of kidney stones    Hypertension    Hypertensive retinopathy of both eyes    Dr. Herbert Deaner   Low back pain    spondylosis, listhesis, +scoliosis at L/S jxn   Lower extremity edema    lasix prn   OA (osteoarthritis)    Osteopenia    S/P mitral valve clip implantation 11/06/2020   s/p TEER with one XTW MitraClip on A2P2 by Dr.  Burt Knack.  DAPT w/ASA and plavix x 3 mo post procedure recommended   Severe mitral regurgitation    2021 transthoracic echo and TEE->cardiology following.  Mitral valve clip 10/2020.  f/u echo 10/28/20 mild/mod MVR and tricusp regurg   Severe pulmonary hypertension ([place]) 2021   transth echo; moderate by TEE 27/7412   Systolic murmur 87/86/7672   severe mitral regurge, mod/severe tricuspic regurg     Past Surgical History:  Procedure Laterality Date   APPENDECTOMY     86 yrs old   [place]  2004   ruptured disc   CHOLECYSTECTOMY     age 69   CYSTOSCOPY  02/24/2021   (for gross hematuria) ->normal.   LUMBAR LAMINECTOMY  2004   MITRAL VALVE REPAIR N/A 11/06/2020   Procedure: MITRAL VALVE REPAIR;  Surgeon: Sherren Mocha, MD;  Location: [place] [facility];  Service: Cardiovascular;  Laterality: N/A;   RIGHT/LEFT HEART CATH AND CORONARY ANGIOGRAPHY N/A 09/16/2020   No CAD. Procedure: RIGHT/LEFT HEART CATH AND CORONARY ANGIOGRAPHY;  Surgeon: Belva Crome, MD;  Location: [place] [facility];  Service: Cardiovascular;  Laterality: N/A;   TEE WITHOUT CARDIOVERSION N/A 09/16/2020   Procedure: TRANSESOPHAGEAL ECHOCARDIOGRAM (TEE);  Surgeon: Buford Dresser, MD;  Location: [facility] ENDOSCOPY;  Service: Cardiovascular;  Laterality: N/A;   TEE WITHOUT  CARDIOVERSION N/A 11/06/2020   Procedure: TRANSESOPHAGEAL ECHOCARDIOGRAM (TEE);  Surgeon: Sherren Mocha, MD;  Location: [place] [facility];  Service: Cardiovascular;  Laterality: N/A;   TONSILLECTOMY     age 70   TOTAL KNEE ARTHROPLASTY Left 06/02/2021   Procedure: LEFT TOTAL KNEE ARTHROPLASTY;  Surgeon: Renette Butters, MD;  Location: [facility];  Service: Orthopedics;  Laterality: Left;   TRANSTHORACIC ECHOCARDIOGRAM  07/03/2020; 10/28/20; 12/03/20   06/2020 TTE EF 60-65%, grd II DD, severe pulm art HTN, severe mitral valve regurg, mod/sev tricuspic valve regurg. Conf on TEE 08/2020.  10/28/20 (s/p MV clip procedure) mild-mod MVR, EF 55-60%. 11/2020 EF  60%, mild/mod MR w/mean grad 46m hg and mod to sev TR.   Family History  Problem Relation Age of Onset   Hypertension Mother    Early death Father    Cancer Father    Liver cancer Father    Hypertension Other    Colon cancer Neg Hx    Rectal cancer Neg Hx    Social History   Tobacco Use   Smoking status: Never   Smokeless tobacco: Never  Vaping Use   Vaping Use: Never used  Substance Use Topics   Alcohol use: No   Drug use: Never   Current Outpatient Medications  Medication Sig Dispense Refill   amoxicillin (AMOXIL) 500 MG tablet Take 4 tablets (2,000 mg) one hour prior to all dental visits. 8 tablet 11   Ascorbic Acid (VITAMIN C PO) Take 1,000 mg by mouth daily.     carvedilol (COREG) 25 MG tablet Take 1 tablet (25 mg total) by mouth daily. 90 tablet 3   cholecalciferol (VITAMIN D) 25 MCG (1000 UNIT) tablet Take 1,000 Units by mouth daily.     diazepam (VALIUM) 2 MG tablet Take 2 mg by mouth daily as needed for anxiety.     gabapentin (NEURONTIN) 100 MG capsule Take 1 capsule (100 mg total) by mouth 3 (three) times daily. 90 capsule 3   loratadine (CLARITIN) 10 MG tablet TAKE ONE TABLET BY MOUTH EVERY DAY AS NEEDED FOR ALLERGY (Patient taking differently: Take 10 mg by mouth daily as needed for allergies.) 90 tablet 3   meloxicam (MOBIC) 15 MG tablet Take 1 tablet (15 mg total) by mouth daily. 30 tablet 1   Multiple Vitamin (MULTIVITAMIN WITH MINERALS) TABS tablet Take 1 tablet by mouth daily.     Multiple Vitamins-Minerals (PRESERVISION AREDS PO) Take 1 tablet by mouth in the morning and at bedtime.      Multiple Vitamins-Minerals (ZINC PO) Take 50 mg by mouth daily.     nitroGLYCERIN (NITRODUR - DOSED IN MG/24 HR) 0.2 mg/hr patch 1/2 patch over L Achilles tendon bid 30 patch 12   potassium chloride (KLOR-CON) 10 MEQ tablet Take 10 mEq by mouth daily as needed (when taking lasix).     sodium chloride (MURO 128) 5 % ophthalmic solution Place 1 drop into both eyes at bedtime.      vitamin E 180 MG (400 UNITS) capsule Take 400 Units by mouth daily.     furosemide (LASIX) 20 MG tablet Take 1 tablet (20 mg total) by mouth every Monday, Wednesday, and Friday. (Patient not taking: Reported on 07/02/2021) 36 tablet 0   No current facility-administered medications for this visit.   Allergies  Allergen Reactions   Barbiturates Nausea And Vomiting   Demerol Other (See Comments)    sick   Hydrochlorothiazide Other (See Comments)    unknown   Irbesartan  HA   Losartan Potassium     hair loss   Olmesartan Medoxomil     hair loss   Telmisartan     cramps     Review of Systems: All systems reviewed and negative except where noted in HPI.     Physical Exam:    Wt Readings from Last 3 Encounters:  08/04/21 119 lb 6 oz (54.1 kg)  07/22/21 120 lb (54.4 kg)  07/03/21 123 lb (55.8 kg)    BP (!) 150/72   Pulse (!) 58   Ht _0  (1.575 m)   Wt 119 lb 6 oz (54.1 kg)   SpO2 98%   BMI 21.83 kg/m  Constitutional:  Pleasant, in no acute distress. Psychiatric: Normal mood and affect. Behavior is normal. Cardiovascular: 4/6 SEM, Normal rate, regular rhythm. No edema Pulmonary/chest: Effort normal and breath sounds normal. No wheezing, rales or rhonchi. Abdominal: Soft, nondistended, nontender. Bowel sounds active throughout. There are no masses palpable. No hepatomegaly. Neurological: Alert and oriented to person place and time. Skin: Skin is warm and dry. No rashes noted.   ASSESSMENT AND PLAN;   1) Dilated Common Bile Duct CTPA with incidentally noted dilated CBD at 24 mm.  Interestingly, has a history of mildly dilated CBD dating back to 2003 (15 mm) and most recently noted on CT in April (13 mm).  Suspect more recent progression to be CBD dilation 2/2 opiate use in the postoperative setting after left TKR.  Otherwise normal liver enzymes and no clinical or serologic evidence of impaired hepatic synthetic function.  Discussed diagnostic options today to  include 1) MRCP now, 2) MRCP after completion of opiates and she prefers the latter  - Order MRCP to be done in December to allow tapering off postoperative opioids - If any concern for active hepatobiliary pathology, to call the office for expedited liver enzymes and MRCP  2) Chronic NSAID use 3) Epigastric pain Episodic epigastric pain.  Patient prefers to hold off on H. pylori testing and diagnostic EGD given infrequency and minimally bothersome nature of symptoms - Start Prilosec 20 mg/day for gatric prophylaxis while on Mobic - To call the office if change in symptomatology warranting expedited endoscopic work-up  4) History of total knee arthroplasty - Weaning off opiates as above - Follow with [facility], DO, FACG  08/04/2021, 3:31 PM   McGowen, Adrian Blackwater, MD

## 2021-08-04 NOTE — Patient Instructions (Addendum)
If you are age 84 or older, your body mass index should be between 23-30. Your Body mass index is 21.83 kg/m. If this is out of the aforementioned range listed, please consider follow up with your Primary Care Provider.  If you are age 68 or younger, your body mass index should be between 19-25. Your Body mass index is 21.83 kg/m. If this is out of the aformentioned range listed, please consider follow up with your Primary Care Provider.   __________________________________________________________  The [place] [facility] providers would like to encourage you to use [facility] to communicate with providers for non-urgent requests or questions.  Due to long hold times on the telephone, sending your provider a message by [facility] may be a faster and more efficient way to get a response.  Please allow 48 business hours for a response.  Please remember that this is for non-urgent requests.  ___________________________________________________________  We have sent the following medications to your pharmacy for you to pick up at your convenience:  Prilosec 20 mg daily  Please have your labs drawn in the beginning of December for your MRI. You will receive a call to schedule the MRI from central scheduling at [facility].  Thank you for choosing me and [facility].  Vito Cirigliano, D.O.

## 2021-08-05 ENCOUNTER — Telehealth: Payer: Self-pay | Admitting: Gastroenterology

## 2021-08-05 NOTE — Telephone Encounter (Signed)
Spoke with patient, she states that MRI was ordered for [facility] and she would prefer to have this at [place]. Advised patient to let radiology scheduling know that she would like to have MRI at [place] when they call her to schedule. Patient verbalized understanding and had no concerns at the the end of the call.

## 2021-08-05 NOTE — Telephone Encounter (Signed)
Patient called wanting to know if she can have an appointment to go downstairs for MRI.

## 2021-08-06 DIAGNOSIS — R531 Weakness: Secondary | ICD-10-CM | POA: Diagnosis not present

## 2021-08-06 DIAGNOSIS — Z96652 Presence of left artificial knee joint: Secondary | ICD-10-CM | POA: Diagnosis not present

## 2021-08-06 DIAGNOSIS — R262 Difficulty in walking, not elsewhere classified: Secondary | ICD-10-CM | POA: Diagnosis not present

## 2021-08-06 DIAGNOSIS — M25562 Pain in left knee: Secondary | ICD-10-CM | POA: Diagnosis not present

## 2021-08-06 DIAGNOSIS — Z4789 Encounter for other orthopedic aftercare: Secondary | ICD-10-CM | POA: Diagnosis not present

## 2021-08-06 DIAGNOSIS — R600 Localized edema: Secondary | ICD-10-CM | POA: Diagnosis not present

## 2021-08-10 DIAGNOSIS — M25572 Pain in left ankle and joints of left foot: Secondary | ICD-10-CM | POA: Diagnosis not present

## 2021-08-10 DIAGNOSIS — M1711 Unilateral primary osteoarthritis, right knee: Secondary | ICD-10-CM | POA: Diagnosis not present

## 2021-08-10 DIAGNOSIS — M1712 Unilateral primary osteoarthritis, left knee: Secondary | ICD-10-CM | POA: Diagnosis not present

## 2021-08-11 ENCOUNTER — Other Ambulatory Visit: Payer: Self-pay

## 2021-08-11 ENCOUNTER — Encounter: Payer: Self-pay | Admitting: Family Medicine

## 2021-08-11 ENCOUNTER — Telehealth: Payer: Self-pay

## 2021-08-11 ENCOUNTER — Ambulatory Visit (INDEPENDENT_AMBULATORY_CARE_PROVIDER_SITE_OTHER): Payer: Medicare Other | Admitting: Family Medicine

## 2021-08-11 VITALS — BP 132/67 | HR 55 | Temp 97.6°F | Ht 62.0 in | Wt 117.0 lb

## 2021-08-11 DIAGNOSIS — K838 Other specified diseases of biliary tract: Secondary | ICD-10-CM

## 2021-08-11 DIAGNOSIS — I272 Pulmonary hypertension, unspecified: Secondary | ICD-10-CM

## 2021-08-11 DIAGNOSIS — R0609 Other forms of dyspnea: Secondary | ICD-10-CM

## 2021-08-11 DIAGNOSIS — G4701 Insomnia due to medical condition: Secondary | ICD-10-CM

## 2021-08-11 DIAGNOSIS — R1013 Epigastric pain: Secondary | ICD-10-CM

## 2021-08-11 MED ORDER — IBANDRONATE SODIUM 150 MG PO TABS: 150.0000 mg | ORAL_TABLET | ORAL | 3 refills | Status: DC

## 2021-08-11 MED ORDER — TRAZODONE HCL 50 MG PO TABS: ORAL_TABLET | ORAL | 1 refills | Status: DC

## 2021-08-11 NOTE — Progress Notes (Signed)
OFFICE VISIT  08/11/2021  CC:  Chief Complaint  Patient presents with   Knee Pain    Follow up   HPI:    Patient is a 86 y.o. female who presents for f/u multiple problems. I last saw her 3 wks ago.   A/P as of that visit: "1) Peripheral neuropathy pain: pt cannot tolerate gabapentin. We decided on no further med trial for this problem at this time.   2) R hand tremor: 18 mo hx, pt says progressing. I am yet to observe this tremor when she is in the office.  Entire neurol exam normal. Reassured her but we decided further eval with neurologist is best next step to see if any further w/u is indicated.   3) Left knee osteoarthritis pain in pt with hx of L TKA about 7 weeks ago. Cont PT. Will see if she gets any benefit from meloxicam 15mg  qd. Advised her to not take any otc pain meds.   4) Progressive extrahepatic bile duct dilatation.  Pt does still have gallbladder. She is asymptomatic.  No abnl wt loss. Hepatic panel has been normal. Rpt hepatic panel today and check lipase as well. Will refer to GI.  No imaging ordered today.   5) Osteoporosis screening. Pt is a high fall risk. Screening DEXA ordered. She takes vit D daily and I recommended she start 600mg  calcium bid."  INTERIM HX:  Patient is concerned about having more and more prob with SOB, worse with ambulation/activity.  No CP, no nausea, no palpitations.  No wheezing, no cough, no dizziness. Going on about 2 mo now, getting worse. TAkes lasix about 1-2 times a week when she notes LE swelling.  She saw Dr. Bryan Lemma in GI on 08/04/21 for cbd dilation. Per Dr. Vivia Ewing note->"Suspect more recent progression to be CBD dilation 2/2 opiate use in the postoperative setting after left TKR". Plan is to do MRCP soon, when pt calls to schedule.  She has not been on opioids for at least 3 wks now.   She has a feeling of intermittent fullness in the subxiphoid area mostly after she starts eating.  She has no appetite.   No dysphagia, no pain with swallowing.  No nausea.  No regurgitation.  She took Prilosec prescribed by Dr. Bryan Lemma recently but when she noted no improvement in a couple of days she took over-the-counter Pepcid and says this helped a little.  She does note that her breathing difficulty seems to stem somewhat from the feeling of fullness and discomfort in the subxiphoid area.  ROS as above, plus--> no fevers,no HAs, no rashes, no melena/hematochezia.  No polyuria or polydipsia.  Chronic L knee pain.  No focal weakness or paresthesias.  She has right UE tremor on and off.  No acute vision or hearing abnormalities.  No dysuria or unusual/new urinary urgency or frequency.  No recent changes in lower legs. No constipation, diarrhea, or abd pain.      Past Medical History:  Diagnosis Date   Achilles tendon rupture    left, no surgery required   Chronic diastolic heart failure ([place])    Gross hematuria 12/2020   Klebsiella on urine clx x2 but no UTI sx's.  CT showed tiny stones in each renal pelvis but no ureteral or bladder stones, no mass.  Urol did cysto->normal. Plan is annual UA, rpt hematuria w/u in 3-5 yrs if persistent pos.   History of kidney stones    Hypertension    Hypertensive retinopathy of  both eyes    Dr. Herbert Deaner   Low back pain    spondylosis, listhesis, +scoliosis at L/S jxn   Lower extremity edema    lasix prn   OA (osteoarthritis)    Osteopenia    S/P mitral valve clip implantation 11/06/2020   s/p TEER with one XTW MitraClip on A2P2 by Dr. Burt Knack.  DAPT w/ASA and plavix x 3 mo post procedure recommended   Severe mitral regurgitation    2021 transthoracic echo and TEE->cardiology following.  Mitral valve clip 10/2020.  f/u echo 10/28/20 mild/mod MVR and tricusp regurg   Severe pulmonary hypertension ([place]) 2021   transth echo; moderate by TEE 81/8299   Systolic murmur 37/16/9678   severe mitral regurge, mod/severe tricuspic regurg    Past Surgical History:  Procedure  Laterality Date   APPENDECTOMY     86 yrs old   [place]  2004   ruptured disc   CHOLECYSTECTOMY     age 89   CYSTOSCOPY  02/24/2021   (for gross hematuria) ->normal.   LUMBAR LAMINECTOMY  2004   MITRAL VALVE REPAIR N/A 11/06/2020   Procedure: MITRAL VALVE REPAIR;  Surgeon: Sherren Mocha, MD;  Location: [place] [facility];  Service: Cardiovascular;  Laterality: N/A;   RIGHT/LEFT HEART CATH AND CORONARY ANGIOGRAPHY N/A 09/16/2020   No CAD. Procedure: RIGHT/LEFT HEART CATH AND CORONARY ANGIOGRAPHY;  Surgeon: Belva Crome, MD;  Location: [place] [facility];  Service: Cardiovascular;  Laterality: N/A;   TEE WITHOUT CARDIOVERSION N/A 09/16/2020   Procedure: TRANSESOPHAGEAL ECHOCARDIOGRAM (TEE);  Surgeon: Buford Dresser, MD;  Location: [facility] END[facility];  Service: Cardiovascular;  Laterality: N/A;   TEE WITHOUT CARDIOVERSION N/A 11/06/2020   Procedure: TRANSESOPHAGEAL ECHOCARDIOGRAM (TEE);  Surgeon: Sherren Mocha, MD;  Location: [place] [facility];  Service: Cardiovascular;  Laterality: N/A;   TONSILLECTOMY     age 23   TOTAL KNEE ARTHROPLASTY Left 06/02/2021   Procedure: LEFT TOTAL KNEE ARTHROPLASTY;  Surgeon: Renette Butters, MD;  Location: [facility];  Service: Orthopedics;  Laterality: Left;   TRANSTHORACIC ECHOCARDIOGRAM  07/03/2020; 10/28/20; 12/03/20   06/2020 TTE EF 60-65%, grd II DD, severe pulm art HTN, severe mitral valve regurg, mod/sev tricuspic valve regurg. Conf on TEE 08/2020.  10/28/20 (s/p MV clip procedure) mild-mod MVR, EF 55-60%. 11/2020 EF 60%, mild/mod MR w/mean grad 2mm hg and mod to sev TR.    Outpatient Medications Prior to Visit  Medication Sig Dispense Refill   Ascorbic Acid (VITAMIN C PO) Take 1,000 mg by mouth daily.     carvedilol (COREG) 25 MG tablet Take 1 tablet (25 mg total) by mouth daily. 90 tablet 3   cholecalciferol (VITAMIN D) 25 MCG (1000 UNIT) tablet Take 1,000 Units by mouth daily.     diazepam (VALIUM) 2 MG tablet Take 2 mg by mouth daily as  needed for anxiety.     furosemide (LASIX) 20 MG tablet Take 1 tablet (20 mg total) by mouth every Monday, Wednesday, and Friday. 36 tablet 0   Multiple Vitamin (MULTIVITAMIN WITH MINERALS) TABS tablet Take 1 tablet by mouth daily.     Multiple Vitamins-Minerals (PRESERVISION AREDS PO) Take 1 tablet by mouth in the morning and at bedtime.      Multiple Vitamins-Minerals (ZINC PO) Take 50 mg by mouth daily.     nitroGLYCERIN (NITRODUR - DOSED IN MG/24 HR) 0.2 mg/hr patch 1/2 patch over L Achilles tendon bid 30 patch 12   omeprazole (PRILOSEC) 20 MG capsule Take 1 capsule (  20 mg total) by mouth daily. 90 capsule 3   potassium chloride (KLOR-CON) 10 MEQ tablet Take 10 mEq by mouth daily as needed (when taking lasix).     sodium chloride (MURO 128) 5 % ophthalmic solution Place 1 drop into both eyes at bedtime.     vitamin E 180 MG (400 UNITS) capsule Take 400 Units by mouth daily.     amoxicillin (AMOXIL) 500 MG tablet Take 4 tablets (2,000 mg) one hour prior to all dental visits. (Patient not taking: Reported on 08/11/2021) 8 tablet 11   gabapentin (NEURONTIN) 100 MG capsule Take 1 capsule (100 mg total) by mouth 3 (three) times daily. (Patient not taking: Reported on 08/11/2021) 90 capsule 3   loratadine (CLARITIN) 10 MG tablet TAKE ONE TABLET BY MOUTH EVERY DAY AS NEEDED FOR ALLERGY (Patient not taking: Reported on 08/11/2021) 90 tablet 3   meloxicam (MOBIC) 15 MG tablet Take 1 tablet (15 mg total) by mouth daily. (Patient not taking: Reported on 08/11/2021) 30 tablet 1   No facility-administered medications prior to visit.    Allergies  Allergen Reactions   Barbiturates Nausea And Vomiting   Demerol Other (See Comments)    sick   Hydrochlorothiazide Other (See Comments)    unknown   Irbesartan     HA   Losartan Potassium     hair loss   Olmesartan Medoxomil     hair loss   Telmisartan     cramps    ROS As per HPI  PE: Vitals with BMI 08/11/2021 08/04/2021 07/22/2021  Height 5'  2" 5\' 2"  5\' 2"   Weight 117 lbs 119 lbs 6 oz 120 lbs  BMI 21.39 15.40 08.67  Systolic 619 509 326  Diastolic 67 72 75  Pulse 55 58 62   Gen: alert, tired appearing but NAD.  Ambulates with cane. CV: RRR, 4/6 syst murmur, no r/g.  No diastolic murmur. Chest is clear, no wheezing or rales. Normal symmetric air entry throughout both lung fields. No chest wall deformities or tenderness. EXT: no clubbing or cyanosis.  no edema.    LABS:    Chemistry      Component Value Date/Time   NA 143 07/22/2021 1444   NA 148 (H) 08/27/2020 1525   K 4.3 07/22/2021 1444   CL 104 07/22/2021 1444   CO2 30 07/22/2021 1444   BUN 21 07/22/2021 1444   BUN 18 08/27/2020 1525   CREATININE 0.58 07/22/2021 1444   CREATININE 0.61 01/14/2021 1138      Component Value Date/Time   CALCIUM 9.2 07/22/2021 1444   ALKPHOS 69 07/22/2021 1444   AST 12 07/22/2021 1444   ALT 6 07/22/2021 1444   BILITOT 0.7 07/22/2021 1444     Lab Results  Component Value Date   LIPASE 13.0 07/22/2021   Lab Results  Component Value Date   DDIMER 3.74 (H) 07/03/2021   Lab Results  Component Value Date   WBC 6.7 07/03/2021   HGB 12.4 07/03/2021   HCT 38.9 07/03/2021   MCV 91.5 07/03/2021   PLT 308 07/03/2021   IMPRESSION AND PLAN:  1) SOB/DOE, chronic: multifactorial-> pulm htn secondary to cardiac issues. Deconditioning playing a role.  CT angio when pt was in the midst of SOB/DOE problem-->no PE.  Reassured, pt states she would like to see a pulmonologist so I ordered referral today.  2) Subxyphoid discomfort/fullness: increases when eating.  ? Gastritis/PUD. Seems to be helped some by pepcid recently  I encouraged her  to go ahead and restart prilosec recently rx'd by Dr. Bryan Lemma and see if more consistent/long term use helps.  3) Progressive extrahepatic bile duct dilatation.  Pt does still have gallbladder. She is asymptomatic.  No abnl wt loss. Hepatic panel and lipase have been normal. Dr. Bryan Lemma says  recent inc in dilation most likely d/t opioid use in post-op (TKA) setting. MRCP to be done soon.  4) Left knee osteoarthritis pain in pt with hx of L TKA about 7 weeks ago. Cont PT. No benefit from recent trial of meloxicam 15mg  qd.  She stopped this med not long after starting it. Advised her to not take any otc pain meds.  5) R hand tremor: 18+ mo hx, pt says progressing. On 2 prior eval's for this in my office I did not notice any tremor on exam, but today I did not resting tremor in R hand.  Otherwise, her entire neurol exam normal. Neurol referral ordered last visit, pt is to call them back to schedule.  6) Insomnia: mostly secondary to knee pain keeping her up. She wants to try a rx med to aid with sleep so I rx'd trazodone 50mg , 1-2 qhs prn. Therapeutic expectations and side effect profile of medication discussed today.  Patient's questions answered.  An After Visit Summary was printed and given to the patient.  FOLLOW UP: Return in about 3 months (around 11/11/2021) for routine chronic illness f/u.  Signed:  Crissie Sickles, MD           08/11/2021

## 2021-08-11 NOTE — Telephone Encounter (Signed)
-----   Message from Erin Sou, MD sent at 08/11/2021 12:39 PM EDT ----- Pt's bone density test showed osteoporosis.  I recommend she take Boniva to try to treat this.  Pls eRx boniva 150mg  tab, 1 tab po q month, #3, rf x 3. Plan for rpt bone density testing in 2 yrs.

## 2021-08-12 ENCOUNTER — Telehealth: Payer: Self-pay | Admitting: Family Medicine

## 2021-08-12 DIAGNOSIS — I10 Essential (primary) hypertension: Secondary | ICD-10-CM

## 2021-08-12 NOTE — Telephone Encounter (Signed)
Spoke with pt and lab appt scheduled.

## 2021-08-12 NOTE — Telephone Encounter (Signed)
Pt is having an MRI on 10/29 at [facility] and that doc is wanting blood work from [place], how should I schedule this an OV or NURSE visit?

## 2021-08-12 NOTE — Telephone Encounter (Signed)
Pt is having MR abdomen WWO contrast ordered by Dr.Cirigliano who also would like pt to have creatinine and BUN completed that he ordered on 10/11.    Please review and advise

## 2021-08-12 NOTE — Telephone Encounter (Signed)
OK, lab appt only. Orders are in under my name now and we'll have to forward results to Dr. Bryan Lemma.

## 2021-08-13 ENCOUNTER — Other Ambulatory Visit: Payer: Self-pay

## 2021-08-13 ENCOUNTER — Ambulatory Visit (INDEPENDENT_AMBULATORY_CARE_PROVIDER_SITE_OTHER): Payer: Medicare Other

## 2021-08-13 DIAGNOSIS — I10 Essential (primary) hypertension: Secondary | ICD-10-CM

## 2021-08-13 LAB — BUN: BUN: 22 mg/dL (ref 6–23)

## 2021-08-13 LAB — CREATININE, SERUM: Creatinine, Ser: 0.63 mg/dL (ref 0.40–1.20)

## 2021-08-17 DIAGNOSIS — H35363 Drusen (degenerative) of macula, bilateral: Secondary | ICD-10-CM | POA: Diagnosis not present

## 2021-08-17 DIAGNOSIS — H353122 Nonexudative age-related macular degeneration, left eye, intermediate dry stage: Secondary | ICD-10-CM | POA: Diagnosis not present

## 2021-08-17 DIAGNOSIS — M1711 Unilateral primary osteoarthritis, right knee: Secondary | ICD-10-CM | POA: Diagnosis not present

## 2021-08-17 DIAGNOSIS — H3562 Retinal hemorrhage, left eye: Secondary | ICD-10-CM | POA: Diagnosis not present

## 2021-08-17 DIAGNOSIS — H40013 Open angle with borderline findings, low risk, bilateral: Secondary | ICD-10-CM | POA: Diagnosis not present

## 2021-08-18 ENCOUNTER — Telehealth: Payer: Self-pay

## 2021-08-18 NOTE — Telephone Encounter (Signed)
Patient is calling about D-Dimer results.  She said she has some questions.  She is not feeling well, has MRI scheduled this Saturday, and she was looking in her mychart and seen where d-dimer results were out of range than being normal and maybe that is why she is felling bad. I could not find any other date, d-dimer was tested, only 07/03/21.  Please call [phone]

## 2021-08-18 NOTE — Telephone Encounter (Signed)
Please review and advise.

## 2021-08-19 ENCOUNTER — Ambulatory Visit: Payer: Medicare Other | Admitting: Neurology

## 2021-08-19 ENCOUNTER — Telehealth: Payer: Self-pay | Admitting: General Surgery

## 2021-08-19 ENCOUNTER — Telehealth: Payer: Self-pay

## 2021-08-19 MED ORDER — DIAZEPAM 2 MG PO TABS
ORAL_TABLET | ORAL | 0 refills | Status: DC
Start: 2021-08-19 — End: 2021-09-02

## 2021-08-19 NOTE — Telephone Encounter (Signed)
  Patient called stating that she is feeling worse than she was seen by Dr. Bryan Lemma on 08/04/21. States feeling of food getting stuck in her chest,having epigastric pain, and scare to eat meals making her loose weight. Requesting to get her MRI done at [place] as soon as she can before her appt. at [place] [facility] on 08/22/21 @ 10am for MRI. Advised patient we will call her back once we set her appt. Patient verbalized understanding.    x

## 2021-08-19 NOTE — Telephone Encounter (Signed)
Please provide Rx for Valium 2 mg tablet, #2, RF0 for procedure-related anxiety. Take 1 tablet 30 to 60 minutes prior to procedure/imaging study; if needed due to incomplete response and/or duration of procedure, may repeat the dose after 30 to 60 minutes.

## 2021-08-19 NOTE — Telephone Encounter (Signed)
Her D dimer was checked when she went to the emergency department for shortness of breath on 07/03/21. Elevation of the d dimer can sometimes be seen in a patient with a blood clot in the lungs.  However, she had a lung scan that day and it did NOT show any clot.  The d dimer can be elevated for other reasons, the most common is recent surgery--which she had about a month prior to this testing.  Reassure her that this lab has nothing to do with the way she is feeling.

## 2021-08-19 NOTE — Telephone Encounter (Signed)
Contacted the patient in reference to her MRI, by the time I called back she had already checked with central scheduling and found out Saturday was the quickest she could get in.

## 2021-08-19 NOTE — Telephone Encounter (Signed)
The patient contacted the office requesting an rx for one valium to do her MRI on Saturday.

## 2021-08-19 NOTE — Telephone Encounter (Signed)
Spoke with pt regarding recommendations,voiced understanding.

## 2021-08-20 ENCOUNTER — Ambulatory Visit (HOSPITAL_COMMUNITY): Payer: Medicare Other

## 2021-08-22 ENCOUNTER — Ambulatory Visit (HOSPITAL_BASED_OUTPATIENT_CLINIC_OR_DEPARTMENT_OTHER)
Admission: RE | Admit: 2021-08-22 | Discharge: 2021-08-22 | Disposition: A | Discharge status: Home / Self Care | Payer: Medicare Other | Source: Ambulatory Visit | Attending: Gastroenterology | Admitting: Gastroenterology

## 2021-08-22 ENCOUNTER — Other Ambulatory Visit: Payer: Self-pay

## 2021-08-22 ENCOUNTER — Ambulatory Visit (HOSPITAL_BASED_OUTPATIENT_CLINIC_OR_DEPARTMENT_OTHER): Payer: Medicare Other

## 2021-08-22 DIAGNOSIS — K838 Other specified diseases of biliary tract: Secondary | ICD-10-CM | POA: Insufficient documentation

## 2021-08-22 DIAGNOSIS — N2889 Other specified disorders of kidney and ureter: Secondary | ICD-10-CM | POA: Diagnosis not present

## 2021-08-22 DIAGNOSIS — N281 Cyst of kidney, acquired: Secondary | ICD-10-CM | POA: Diagnosis not present

## 2021-08-22 DIAGNOSIS — K7689 Other specified diseases of liver: Secondary | ICD-10-CM | POA: Diagnosis not present

## 2021-08-22 MED ORDER — GADOBUTROL 1 MMOL/ML IV SOLN
5.3000 mL | Freq: Once | INTRAVENOUS | Status: AC | PRN
  Administered 2021-08-22: 5.3 mL via INTRAVENOUS

## 2021-08-24 DIAGNOSIS — M1711 Unilateral primary osteoarthritis, right knee: Secondary | ICD-10-CM | POA: Diagnosis not present

## 2021-08-26 ENCOUNTER — Ambulatory Visit: Payer: Medicare Other | Admitting: Family Medicine

## 2021-08-31 ENCOUNTER — Other Ambulatory Visit: Payer: Self-pay

## 2021-09-01 DIAGNOSIS — Z4789 Encounter for other orthopedic aftercare: Secondary | ICD-10-CM | POA: Diagnosis not present

## 2021-09-01 DIAGNOSIS — Z96652 Presence of left artificial knee joint: Secondary | ICD-10-CM | POA: Diagnosis not present

## 2021-09-01 DIAGNOSIS — R600 Localized edema: Secondary | ICD-10-CM | POA: Diagnosis not present

## 2021-09-01 DIAGNOSIS — R531 Weakness: Secondary | ICD-10-CM | POA: Diagnosis not present

## 2021-09-01 DIAGNOSIS — M25562 Pain in left knee: Secondary | ICD-10-CM | POA: Diagnosis not present

## 2021-09-01 DIAGNOSIS — R262 Difficulty in walking, not elsewhere classified: Secondary | ICD-10-CM | POA: Diagnosis not present

## 2021-09-02 ENCOUNTER — Other Ambulatory Visit: Payer: Self-pay

## 2021-09-02 ENCOUNTER — Ambulatory Visit (INDEPENDENT_AMBULATORY_CARE_PROVIDER_SITE_OTHER): Payer: Medicare Other | Admitting: Family Medicine

## 2021-09-02 ENCOUNTER — Encounter: Payer: Self-pay | Admitting: Family Medicine

## 2021-09-02 VITALS — BP 155/78 | HR 61 | Temp 98.0°F | Ht 62.0 in | Wt 117.8 lb

## 2021-09-02 DIAGNOSIS — G4701 Insomnia due to medical condition: Secondary | ICD-10-CM

## 2021-09-02 DIAGNOSIS — K2900 Acute gastritis without bleeding: Secondary | ICD-10-CM

## 2021-09-02 MED ORDER — PANTOPRAZOLE SODIUM 40 MG PO TBEC: 40.0000 mg | DELAYED_RELEASE_TABLET | Freq: Two times a day (BID) | ORAL | 1 refills | Status: DC

## 2021-09-02 MED ORDER — SUCRALFATE 1 G PO TABS: 2.0000 g | ORAL_TABLET | Freq: Four times a day (QID) | ORAL | 1 refills | Status: DC

## 2021-09-02 NOTE — Progress Notes (Signed)
OFFICE VISIT  09/02/2021  CC:  Chief Complaint  Patient presents with   Knee Pain    HPI:    Patient is a 86 y.o. female who presents for 3 wk f/u postprandial sub-xyphoid discomfort/fullness as well as f/u insomnia. A/P as of last visit: "1) SOB/DOE, chronic: multifactorial-> pulm htn secondary to cardiac issues. Deconditioning playing a role.  CT angio when pt was in the midst of SOB/DOE problem-->no PE.  Reassured, pt states she would like to see a pulmonologist so I ordered referral today.   2) Subxyphoid discomfort/fullness: increases when eating.  ? Gastritis/PUD. Seems to be helped some by pepcid recently  I encouraged her to go ahead and restart prilosec recently rx'd by Dr. Bryan Lemma and see if more consistent/long term use helps.   3) Progressive extrahepatic bile duct dilatation.  Pt does still have gallbladder. She is asymptomatic.  No abnl wt loss. Hepatic panel and lipase have been normal. Dr. Bryan Lemma says recent inc in dilation most likely d/t opioid use in post-op (TKA) setting. MRCP to be done soon.   4) Left knee osteoarthritis pain in pt with hx of L TKA about 7 weeks ago. Cont PT. No benefit from recent trial of meloxicam 15mg  qd.  She stopped this med not long after starting it. Advised her to not take any otc pain meds.   5) R hand tremor: 18+ mo hx, pt says progressing. On 2 prior eval's for this in my office I did not notice any tremor on exam, but today I did note resting tremor in R hand.  Otherwise, her entire neurol exam normal. Neurol referral ordered last visit, pt is to call them back to schedule.   6) Insomnia: mostly secondary to knee pain keeping her up. She wants to try a rx med to aid with sleep so I rx'd trazodone 50mg , 1-2 qhs prn."  INTERIM HX: Erin [name] says her upper abdomen is still bothering her.  Says she feels a gnawing sensation that feels "empty", yet when she eats it makes it worse.  Therefore she avoids eating.  She has no  difficulty swallowing or food, no pain with swallowing.  No nausea or vomiting.  She takes occasional meloxicam, otherwise no NSAIDs.  Denies melena or hematochezia.  Has taken over-the-counter strength omeprazole does not seem to have helped.  She has taken the trazodone on a couple of occasions and it has helped with sleep.  Her knee continues to hurt. She has not contacted neurology for an appointment yet.  MRCP 08/22/21 via GI MD all reassuring/unremarkable except for confirming once again common bile duct dilatation (19 mm, decreased from 24 mm noted on CT angio chest 07/03/21).  ROS as above, plus--> no fevers, no CP, no wheezing, no cough, no dizziness, no HAs, no rashes, no melena/hematochezia.  No polyuria or polydipsia.  No myalgias.  No focal weakness.  Still with intermittent R arm tremor.  No acute vision or hearing abnormalities.  No dysuria or unusual/new urinary urgency or frequency.  No recent changes in lower legs.  No palpitations.     Past Medical History:  Diagnosis Date   Achilles tendon rupture    left, no surgery required   Chronic diastolic heart failure ([place])    Gross hematuria 12/2020   Klebsiella on urine clx x2 but no UTI sx's.  CT showed tiny stones in each renal pelvis but no ureteral or bladder stones, no mass.  Urol did cysto->normal. Plan is annual UA, rpt hematuria  w/u in 3-5 yrs if persistent pos.   History of kidney stones    Hypertension    Hypertensive retinopathy of both eyes    Dr. Herbert Deaner   Low back pain    spondylosis, listhesis, +scoliosis at L/S jxn   Lower extremity edema    lasix prn   OA (osteoarthritis)    Osteopenia    Osteoporosis 07/2021   07/2021 T score -4.6   S/P mitral valve clip implantation 11/06/2020   s/p TEER with one XTW MitraClip on A2P2 by Dr. Burt Knack.  DAPT w/ASA and plavix x 3 mo post procedure recommended   Severe mitral regurgitation    2021 transthoracic echo and TEE->cardiology following.  Mitral valve clip 10/2020.   f/u echo 10/28/20 mild/mod MVR and tricusp regurg   Severe pulmonary hypertension ([place]) 2021   transth echo; moderate by TEE 25/4982   Systolic murmur 64/15/8309   severe mitral regurge, mod/severe tricuspic regurg    Past Surgical History:  Procedure Laterality Date   APPENDECTOMY     86 yrs old   [place]  2004   ruptured disc   CHOLECYSTECTOMY     age 89   CYSTOSCOPY  02/24/2021   (for gross hematuria) ->normal.   DEXA  07/2021   2022 T score -4.6 (radius)   LUMBAR LAMINECTOMY  2004   MITRAL VALVE REPAIR N/A 11/06/2020   Procedure: MITRAL VALVE REPAIR;  Surgeon: Sherren Mocha, MD;  Location: [place] [facility];  Service: Cardiovascular;  Laterality: N/A;   RIGHT/LEFT HEART CATH AND CORONARY ANGIOGRAPHY N/A 09/16/2020   No CAD. Procedure: RIGHT/LEFT HEART CATH AND CORONARY ANGIOGRAPHY;  Surgeon: Belva Crome, MD;  Location: [place] [facility];  Service: Cardiovascular;  Laterality: N/A;   TEE WITHOUT CARDIOVERSION N/A 09/16/2020   Procedure: TRANSESOPHAGEAL ECHOCARDIOGRAM (TEE);  Surgeon: Buford Dresser, MD;  Location: [facility] ENDOSCOPY;  Service: Cardiovascular;  Laterality: N/A;   TEE WITHOUT CARDIOVERSION N/A 11/06/2020   Procedure: TRANSESOPHAGEAL ECHOCARDIOGRAM (TEE);  Surgeon: Sherren Mocha, MD;  Location: [place] [facility];  Service: Cardiovascular;  Laterality: N/A;   TONSILLECTOMY     age 60   TOTAL KNEE ARTHROPLASTY Left 06/02/2021   Procedure: LEFT TOTAL KNEE ARTHROPLASTY;  Surgeon: Renette Butters, MD;  Location: [facility];  Service: Orthopedics;  Laterality: Left;   TRANSTHORACIC ECHOCARDIOGRAM  07/03/2020; 10/28/20; 12/03/20   06/2020 TTE EF 60-65%, grd II DD, severe pulm art HTN, severe mitral valve regurg, mod/sev tricuspic valve regurg. Conf on TEE 08/2020.  10/28/20 (s/p MV clip procedure) mild-mod MVR, EF 55-60%. 11/2020 EF 60%, mild/mod MR w/mean grad 65mm hg and mod to sev TR.    Outpatient Medications Prior to Visit  Medication Sig Dispense Refill    Ascorbic Acid (VITAMIN C PO) Take 1,000 mg by mouth daily.     carvedilol (COREG) 25 MG tablet Take 1 tablet (25 mg total) by mouth daily. 90 tablet 3   cholecalciferol (VITAMIN D) 25 MCG (1000 UNIT) tablet Take 1,000 Units by mouth daily.     diazepam (VALIUM) 2 MG tablet Take 1 tablet 30-65minutes before your procedure, if needed you may repeat the dose after 30-60 minutes 2 tablet 0   ibandronate (BONIVA) 150 MG tablet Take 1 tablet (150 mg total) by mouth every 30 (thirty) days. Take in the morning with a full glass of water, on an empty stomach, and do not take anything else by mouth or lie down for the next 30 min. 3 tablet 3  meloxicam (MOBIC) 15 MG tablet Take 1 tablet (15 mg total) by mouth daily. 30 tablet 1   Multiple Vitamin (MULTIVITAMIN WITH MINERALS) TABS tablet Take 1 tablet by mouth daily.     Multiple Vitamins-Minerals (PRESERVISION AREDS PO) Take 1 tablet by mouth in the morning and at bedtime.      Multiple Vitamins-Minerals (ZINC PO) Take 50 mg by mouth daily.     nitroGLYCERIN (NITRODUR - DOSED IN MG/24 HR) 0.2 mg/hr patch 1/2 patch over L Achilles tendon bid 30 patch 12   omeprazole (PRILOSEC) 20 MG capsule Take 1 capsule (20 mg total) by mouth daily. 90 capsule 3   potassium chloride (KLOR-CON) 10 MEQ tablet Take 10 mEq by mouth daily as needed (when taking lasix).     sodium chloride (MURO 128) 5 % ophthalmic solution Place 1 drop into both eyes at bedtime.     traZODone (DESYREL) 50 MG tablet 1-2 tabs po qhs as needed for insomnia 30 tablet 1   vitamin E 180 MG (400 UNITS) capsule Take 400 Units by mouth daily.     amoxicillin (AMOXIL) 500 MG tablet Take 4 tablets (2,000 mg) one hour prior to all dental visits. (Patient not taking: No sig reported) 8 tablet 11   furosemide (LASIX) 20 MG tablet Take 1 tablet (20 mg total) by mouth every Monday, Wednesday, and Friday. 36 tablet 0   gabapentin (NEURONTIN) 100 MG capsule Take 1 capsule (100 mg total) by mouth 3 (three) times  daily. (Patient not taking: No sig reported) 90 capsule 3   loratadine (CLARITIN) 10 MG tablet TAKE ONE TABLET BY MOUTH EVERY DAY AS NEEDED FOR ALLERGY (Patient not taking: No sig reported) 90 tablet 3   No facility-administered medications prior to visit.    Allergies  Allergen Reactions   Barbiturates Nausea And Vomiting   Demerol Other (See Comments)    sick   Hydrochlorothiazide Other (See Comments)    unknown   Irbesartan     HA   Losartan Potassium     hair loss   Olmesartan Medoxomil     hair loss   Telmisartan     cramps    ROS As per HPI  PE: Vitals with BMI 09/02/2021 08/11/2021 08/04/2021  Height 5\' 2"  5\' 2"  5\' 2"   Weight 117 lbs 13 oz 117 lbs 119 lbs 6 oz  BMI 21.54 78.29 56.21  Systolic 308 657 846  Diastolic 78 67 72  Pulse 61 55 58   Gen: Alert, well appearing.  Patient is oriented to person, place, time, and situation. AFFECT: pleasant, lucid thought and speech. No further exam today.  LABS:    Chemistry      Component Value Date/Time   NA 143 07/22/2021 1444   NA 148 (H) 08/27/2020 1525   K 4.3 07/22/2021 1444   CL 104 07/22/2021 1444   CO2 30 07/22/2021 1444   BUN 22 08/13/2021 0946   BUN 18 08/27/2020 1525   CREATININE 0.63 08/13/2021 0946   CREATININE 0.61 01/14/2021 1138      Component Value Date/Time   CALCIUM 9.2 07/22/2021 1444   ALKPHOS 69 07/22/2021 1444   AST 12 07/22/2021 1444   ALT 6 07/22/2021 1444   BILITOT 0.7 07/22/2021 1444     Lab Results  Component Value Date   WBC 6.7 07/03/2021   HGB 12.4 07/03/2021   HCT 38.9 07/03/2021   MCV 91.5 07/03/2021   PLT 308 07/03/2021   Lab Results  Component Value  Date   IRON 54 01/14/2021   TIBC 299 01/14/2021   FERRITIN 80 01/14/2021    IMPRESSION AND PLAN:  #1: Suspect gastritis.  Start pantoprazole 40 mg twice a day.  Start Carafate 1 to 2 mg 4 times daily.  Check H. pylori antibody and check CBC today. Stop meloxicam.  #2: Common bile duct dilatation.  All imaging  reassuring.  This is presumed to be postcholecystectomy effect (plus possible opioid-effect).  #3 insomnia: Improved with as needed use of trazodone.  An After Visit Summary was printed and given to the patient.  FOLLOW UP: No follow-ups on file.  Signed:  Crissie Sickles, MD           09/02/2021

## 2021-09-03 LAB — CBC
HCT: 37.4 % (ref 36.0–46.0)
Hemoglobin: 12.1 g/dL (ref 12.0–15.0)
MCHC: 32.3 g/dL (ref 30.0–36.0)
MCV: 86.7 fl (ref 78.0–100.0)
Platelets: 278 10*3/uL (ref 150.0–400.0)
RBC: 4.32 Mil/uL (ref 3.87–5.11)
RDW: 16.1 % — ABNORMAL HIGH (ref 11.5–15.5)
WBC: 7 10*3/uL (ref 4.0–10.5)

## 2021-09-03 LAB — H. PYLORI ANTIBODY, IGG: H Pylori IgG: POSITIVE — AB

## 2021-09-04 ENCOUNTER — Telehealth: Payer: Self-pay

## 2021-09-04 MED ORDER — TETRACYCLINE HCL 500 MG PO CAPS: 500.0000 mg | ORAL_CAPSULE | Freq: Four times a day (QID) | ORAL | 0 refills | Status: DC

## 2021-09-04 MED ORDER — METRONIDAZOLE 250 MG PO TABS: 250.0000 mg | ORAL_TABLET | Freq: Four times a day (QID) | ORAL | 0 refills | Status: DC

## 2021-09-04 NOTE — Telephone Encounter (Signed)
-----   Message from Tammi Sou, MD sent at 09/04/2021  9:00 AM EST ----- Labs show that pt's stomach symptoms may be coming from a bacteria in stomach (H pylori). Standard treatment for this is 2 weeks of two antibiotics. Pls eRx metronidazole 250mg , 1 tab po qid x 14d, #56, no RF. Also tetracycline 500mg , 1 tab po qid, #56, no RF. She should continue to take the pantoprazole 40mg  twice a day and should also take pepto bismol otc (wafers or liquid) 4 times a day. F/u with me 2-3 wks.

## 2021-09-04 NOTE — Telephone Encounter (Signed)
Spoke with pt regarding results/recommendations,voiced understanding. Medications sent, f/u appt scheduled.

## 2021-09-07 ENCOUNTER — Telehealth: Payer: Self-pay

## 2021-09-07 NOTE — Telephone Encounter (Signed)
Patient started 2 new medications on Friday 09/04/21.  She thinks she is having a med reaction to both meds,   She states her stools have been black.  She is very nauseated and has diarrhea.  metroNIDAZOLE (FLAGYL) 250 MG tablet [[id number]]   tetracycline (SUMYCIN) 500 MG capsule [[id number]]   Please advise [phone].

## 2021-09-08 NOTE — Telephone Encounter (Signed)
OK, stop antibiotics. Have her call us when she feels like her stool color and consistency have returned to her normal (should be only a couple days). We'll have to start a different set of antibiotics at that time.

## 2021-09-08 NOTE — Telephone Encounter (Signed)
Please review and advise.

## 2021-09-08 NOTE — Telephone Encounter (Signed)
Pt advised of recommendations.  

## 2021-09-11 ENCOUNTER — Telehealth: Payer: Self-pay | Admitting: Gastroenterology

## 2021-09-11 DIAGNOSIS — Z96652 Presence of left artificial knee joint: Secondary | ICD-10-CM | POA: Diagnosis not present

## 2021-09-11 DIAGNOSIS — R262 Difficulty in walking, not elsewhere classified: Secondary | ICD-10-CM | POA: Diagnosis not present

## 2021-09-11 DIAGNOSIS — R531 Weakness: Secondary | ICD-10-CM | POA: Diagnosis not present

## 2021-09-11 DIAGNOSIS — M25562 Pain in left knee: Secondary | ICD-10-CM | POA: Diagnosis not present

## 2021-09-11 DIAGNOSIS — R600 Localized edema: Secondary | ICD-10-CM | POA: Diagnosis not present

## 2021-09-11 DIAGNOSIS — Z4789 Encounter for other orthopedic aftercare: Secondary | ICD-10-CM | POA: Diagnosis not present

## 2021-09-11 NOTE — Telephone Encounter (Signed)
Called and spoke with patient. She has been scheduled for a follow up with Alonza Bogus, PA-C on Tuesday, 09/22/21 at 1:30 pm. Patient is aware of the GI provider transfer process. Advised that I will send a message to Dr. Bryan Lemma and Dr. Tarri Glenn. Pt verbalized understanding and had no concerns at the end of the call.  Dr. Bryan Lemma, pt would like to transfer care to Dr. Tarri Glenn. Please advise if you are OK with this.  Dr. Tarri Glenn, if Dr. Bryan Lemma is OK with transfer will you accept the patient? Please advise, thanks

## 2021-09-11 NOTE — Telephone Encounter (Signed)
Patient called states she is having issues with rectal bleeding. Seeking to speak with a nurse also requesting to do a transfer of care over to Dr. Tarri Glenn due to poor communication per patient.

## 2021-09-15 NOTE — Telephone Encounter (Signed)
Called and spoke with patient. She is aware that moving forward her primary GI physician is Dr. Tarri Glenn. Pt will keep f/u appt as scheduled with Alonza Bogus, PA-C on 11/29. Pt verbalized understanding and had no concerns at the end of the call.

## 2021-09-21 ENCOUNTER — Ambulatory Visit: Payer: Medicare Other | Admitting: Family Medicine

## 2021-09-22 ENCOUNTER — Encounter: Payer: Self-pay | Admitting: Gastroenterology

## 2021-09-22 ENCOUNTER — Ambulatory Visit (INDEPENDENT_AMBULATORY_CARE_PROVIDER_SITE_OTHER): Payer: Medicare Other | Admitting: Gastroenterology

## 2021-09-22 VITALS — BP 154/70 | HR 60 | Ht 61.0 in | Wt 118.0 lb

## 2021-09-22 DIAGNOSIS — R1310 Dysphagia, unspecified: Secondary | ICD-10-CM | POA: Diagnosis not present

## 2021-09-22 DIAGNOSIS — K219 Gastro-esophageal reflux disease without esophagitis: Secondary | ICD-10-CM | POA: Diagnosis not present

## 2021-09-22 DIAGNOSIS — Z8619 Personal history of other infectious and parasitic diseases: Secondary | ICD-10-CM

## 2021-09-22 NOTE — Patient Instructions (Signed)
You have been scheduled for an endoscopy. Please follow written instructions given to you at your visit today. If you use inhalers (even only as needed), please bring them with you on the day of your procedure.  If you are age 84 or older, your body mass index should be between 23-30. Your Body mass index is 22.3 kg/m. If this is out of the aforementioned range listed, please consider follow up with your Primary Care Provider.  If you are age 23 or younger, your body mass index should be between 19-25. Your Body mass index is 22.3 kg/m. If this is out of the aformentioned range listed, please consider follow up with your Primary Care Provider.   ________________________________________________________  The [facility] providers would like to encourage you to use [facility] to communicate with providers for non-urgent requests or questions.  Due to long hold times on the telephone, sending your provider a message by [facility] may be a faster and more efficient way to get a response.  Please allow 48 business hours for a response.  Please remember that this is for non-urgent requests.  _______________________________________________________

## 2021-09-22 NOTE — Progress Notes (Signed)
09/22/2021 Erin [name] Erin [name] [id number] [birth year]   HISTORY OF PRESENT ILLNESS: This is an 86 year old female who was seen by Dr. Bryan Lemma in October mostly for images showing dilated common bile duct.  MRCP was not concerning and no further recommendation for follow-up was made; thought was that this was from postcholecystectomy effect.  She then requested to switch to Dr. Tarri Glenn.  She is now here today with complaints of epigastric abdominal pain and dysphagia.  Looks like the epigastric abdominal pain was going on back when she saw Dr. Bryan Lemma in October, but she declined EGD at that time.  She has continued to have issues and she is here again for evaluation.  She says that food is getting stuck in the lower part of her esophagus.  This is occurring regularly.  Has been occurring since about August or September.  H. pylori serology performed by her PCP was positive.  He put her on tetracycline, metronidazole, Pepto-Bismol, and pantoprazole to twice daily.  The Pepto-Bismol made her stools black and she says that the antibiotics made her feel sick so she did not complete them.  She has never had any history of upper GI issues requiring EGD with dilation, etc.   Past Medical History:  Diagnosis Date   Achilles tendon rupture    left, no surgery required   Chronic diastolic heart failure ([place])    Gross hematuria 12/2020   Klebsiella on urine clx x2 but no UTI sx's.  CT showed tiny stones in each renal pelvis but no ureteral or bladder stones, no mass.  Urol did cysto->normal. Plan is annual UA, rpt hematuria w/u in 3-5 yrs if persistent pos.   History of kidney stones    Hypertension    Hypertensive retinopathy of both eyes    Dr. Herbert Deaner   Low back pain    spondylosis, listhesis, +scoliosis at L/S jxn   Lower extremity edema    lasix prn   OA (osteoarthritis)    Osteopenia    Osteoporosis 07/2021   07/2021 T score -4.6   S/P mitral valve clip implantation 11/06/2020   s/p TEER  with one XTW MitraClip on A2P2 by Dr. Burt Knack.  DAPT w/ASA and plavix x 3 mo post procedure recommended   Severe mitral regurgitation    2021 transthoracic echo and TEE->cardiology following.  Mitral valve clip 10/2020.  f/u echo 10/28/20 mild/mod MVR and tricusp regurg   Severe pulmonary hypertension ([place]) 2021   transth echo; moderate by TEE 56/2563   Systolic murmur 89/37/3428   severe mitral regurge, mod/severe tricuspic regurg   Past Surgical History:  Procedure Laterality Date   APPENDECTOMY     86 yrs old   [place]  2004   ruptured disc   CHOLECYSTECTOMY     age 12   CYSTOSCOPY  02/24/2021   (for gross hematuria) ->normal.   DEXA  07/2021   2022 T score -4.6 (radius)   LUMBAR LAMINECTOMY  2004   MITRAL VALVE REPAIR N/A 11/06/2020   Procedure: MITRAL VALVE REPAIR;  Surgeon: Sherren Mocha, MD;  Location: [facility];  Service: Cardiovascular;  Laterality: N/A;   RIGHT/LEFT HEART CATH AND CORONARY ANGIOGRAPHY N/A 09/16/2020   No CAD. Procedure: RIGHT/LEFT HEART CATH AND CORONARY ANGIOGRAPHY;  Surgeon: Belva Crome, MD;  Location: [facility];  Service: Cardiovascular;  Laterality: N/A;   TEE WITHOUT CARDIOVERSION N/A 09/16/2020   Procedure: TRANSESOPHAGEAL ECHOCARDIOGRAM (TEE);  Surgeon: Buford Dresser, MD;  Location: [facility]  ENDOSCOPY;  Service: Cardiovascular;  Laterality: N/A;   TEE WITHOUT CARDIOVERSION N/A 11/06/2020   Procedure: TRANSESOPHAGEAL ECHOCARDIOGRAM (TEE);  Surgeon: Sherren Mocha, MD;  Location: [facility];  Service: Cardiovascular;  Laterality: N/A;   TONSILLECTOMY     age 65   TOTAL KNEE ARTHROPLASTY Left 06/02/2021   Procedure: LEFT TOTAL KNEE ARTHROPLASTY;  Surgeon: Renette Butters, MD;  Location: [facility];  Service: Orthopedics;  Laterality: Left;   TRANSTHORACIC ECHOCARDIOGRAM  07/03/2020; 10/28/20; 12/03/20   06/2020 TTE EF 60-65%, grd II DD, severe pulm art HTN, severe mitral valve regurg, mod/sev tricuspic valve regurg. Conf on  TEE 08/2020.  10/28/20 (s/p MV clip procedure) mild-mod MVR, EF 55-60%. 11/2020 EF 60%, mild/mod MR w/mean grad 80mm hg and mod to sev TR.    reports that she has never smoked. She has never used smokeless tobacco. She reports that she does not drink alcohol and does not use drugs. family history includes Cancer in her father; Early death in her father; Hypertension in her mother and another family member; Liver cancer in her father. Allergies  Allergen Reactions   Barbiturates Nausea And Vomiting   Demerol Other (See Comments)    sick   Hydrochlorothiazide Other (See Comments)    unknown   Irbesartan     HA   Losartan Potassium     hair loss   Olmesartan Medoxomil     hair loss   Telmisartan     cramps      Outpatient Encounter Medications as of 09/22/2021  Medication Sig   Ascorbic Acid (VITAMIN C PO) Take 1,000 mg by mouth daily.   carvedilol (COREG) 25 MG tablet Take 1 tablet (25 mg total) by mouth daily.   cholecalciferol (VITAMIN D) 25 MCG (1000 UNIT) tablet Take 1,000 Units by mouth daily.   furosemide (LASIX) 20 MG tablet Take 1 tablet (20 mg total) by mouth every Monday, Wednesday, and Friday.   gabapentin (NEURONTIN) 100 MG capsule Take 100 mg by mouth 3 (three) times daily.   ibandronate (BONIVA) 150 MG tablet Take 1 tablet (150 mg total) by mouth every 30 (thirty) days. Take in the morning with a full glass of water, on an empty stomach, and do not take anything else by mouth or lie down for the next 30 min.   Multiple Vitamin (MULTIVITAMIN WITH MINERALS) TABS tablet Take 1 tablet by mouth daily.   Multiple Vitamins-Minerals (PRESERVISION AREDS PO) Take 1 tablet by mouth in the morning and at bedtime.    Multiple Vitamins-Minerals (ZINC PO) Take 50 mg by mouth daily.   omeprazole (PRILOSEC) 20 MG capsule Take 20 mg by mouth daily.   potassium chloride (KLOR-CON) 10 MEQ tablet Take 10 mEq by mouth daily as needed (when taking lasix).   sodium chloride (MURO 128) 5 %  ophthalmic solution Place 1 drop into both eyes at bedtime.   traZODone (DESYREL) 50 MG tablet 1-2 tabs po qhs as needed for insomnia   vitamin E 180 MG (400 UNITS) capsule Take 400 Units by mouth daily.   amoxicillin (AMOXIL) 500 MG tablet Take 4 tablets (2,000 mg) one hour prior to all dental visits. (Patient not taking: Reported on 08/11/2021)   nitroGLYCERIN (NITRODUR - DOSED IN MG/24 HR) 0.2 mg/hr patch 1/2 patch over L Achilles tendon bid (Patient not taking: Reported on 09/22/2021)   [DISCONTINUED] metroNIDAZOLE (FLAGYL) 250 MG tablet Take 1 tablet (250 mg total) by mouth 4 (four) times daily.   [DISCONTINUED] pantoprazole (PROTONIX) 40  MG tablet Take 1 tablet (40 mg total) by mouth 2 (two) times daily.   [DISCONTINUED] sucralfate (CARAFATE) 1 g tablet Take 2 tablets (2 g total) by mouth 4 (four) times daily.   [DISCONTINUED] tetracycline (SUMYCIN) 500 MG capsule Take 1 capsule (500 mg total) by mouth 4 (four) times daily.   No facility-administered encounter medications on file as of 09/22/2021.     REVIEW OF SYSTEMS  : All other systems reviewed and negative except where noted in the History of Present Illness.   PHYSICAL EXAM: BP (!) 154/70 (BP Location: Left Arm, Patient Position: Sitting, Cuff Size: Normal)   Pulse 60   Ht 5\' 1"  (1.549 m) Comment: height measured without shoes  Wt 118 lb (53.5 kg)   BMI 22.30 kg/m  General: Well developed white female in no acute distress Head: Normocephalic and atraumatic Eyes:  Sclerae anicteric, conjunctiva pink. Ears: Normal auditory acuity Lungs: Clear throughout to auscultation; no W/R/R. Heart: Regular rate and rhythm; no M/R/G. Abdomen:  Soft, non-distended.  BS present.  Mild epigastric TTP. Musculoskeletal: Symmetrical with no gross deformities  Skin: No lesions on visible extremities Extremities: No edema  Neurological: Alert oriented x 4, grossly non-focal Psychological:  Alert and cooperative. Normal mood and  affect  ASSESSMENT AND PLAN: *Dysphagia: Feels like food is getting stuck in the lower part of her chest for the past few months.  Never had any issues like this in the past.  We will plan for EGD with possible dilation with Dr. Tarri Glenn, which is being done next week.  The risks, benefits, and alternatives to EGD with dilation were discussed with the patient and she consents to proceed.  *H. Pylori: Recently had a positive IgG serology by Dr. Anitra Lauth.  Was placed on metronidazole, tetracycline, twice daily pantoprazole, Pepto-Bismol.  Pepto-Bismol obviously made her stools black, which she complained about, but also complained that the antibiotics made her sick so she did not complete them.  She will likely need to be treated with a different regimen, but will wait till after her EGD since its next week anyway.   CC:  McGowen, Adrian Blackwater, MD

## 2021-09-23 NOTE — Progress Notes (Signed)
Available to help with EGD and possible dilation to expedite her evaluation and treatment.   Alaney Witter L. Tarri Glenn, MD, MPH

## 2021-09-25 ENCOUNTER — Ambulatory Visit (AMBULATORY_SURGERY_CENTER): Payer: Medicare Other | Admitting: Gastroenterology

## 2021-09-25 ENCOUNTER — Encounter: Payer: Self-pay | Admitting: Gastroenterology

## 2021-09-25 ENCOUNTER — Other Ambulatory Visit: Payer: Self-pay

## 2021-09-25 VITALS — BP 147/68 | HR 69 | Temp 97.9°F | Resp 18 | Ht 61.0 in | Wt 118.0 lb

## 2021-09-25 DIAGNOSIS — K2970 Gastritis, unspecified, without bleeding: Secondary | ICD-10-CM | POA: Diagnosis not present

## 2021-09-25 DIAGNOSIS — R1310 Dysphagia, unspecified: Secondary | ICD-10-CM

## 2021-09-25 DIAGNOSIS — K2289 Other specified disease of esophagus: Secondary | ICD-10-CM | POA: Diagnosis not present

## 2021-09-25 DIAGNOSIS — K219 Gastro-esophageal reflux disease without esophagitis: Secondary | ICD-10-CM | POA: Diagnosis not present

## 2021-09-25 DIAGNOSIS — K31A0 Gastric intestinal metaplasia, unspecified: Secondary | ICD-10-CM | POA: Diagnosis not present

## 2021-09-25 DIAGNOSIS — K2950 Unspecified chronic gastritis without bleeding: Secondary | ICD-10-CM | POA: Diagnosis not present

## 2021-09-25 DIAGNOSIS — I10 Essential (primary) hypertension: Secondary | ICD-10-CM | POA: Diagnosis not present

## 2021-09-25 DIAGNOSIS — K208 Other esophagitis without bleeding: Secondary | ICD-10-CM | POA: Diagnosis not present

## 2021-09-25 DIAGNOSIS — K200 Eosinophilic esophagitis: Secondary | ICD-10-CM

## 2021-09-25 MED ORDER — SODIUM CHLORIDE 0.9 % IV SOLN: 500.0000 mL | Freq: Once | INTRAVENOUS | Status: DC

## 2021-09-25 NOTE — Op Note (Signed)
[facility] Patient Name: Erin [name] Procedure Date: 09/25/2021 8:03 AM MRN: [id number] Endoscopist: Thornton Park MD, MD Age: 86 Referring MD:  Date of Birth: Feb 26, 1935 Gender: Female Account #: [id number] Procedure:                Upper GI endoscopy Indications:              Dysphagia, Positive test for Helicobacter pylori by                            IgG with intolerance to antibiotic treatment Medicines:                Monitored Anesthesia Care Procedure:                Pre-Anesthesia Assessment:                           - Prior to the procedure, a History and Physical                            was performed, and patient medications and                            allergies were reviewed. The patient's tolerance of                            previous anesthesia was also reviewed. The risks                            and benefits of the procedure and the sedation                            options and risks were discussed with the patient.                            All questions were answered, and informed consent                            was obtained. Prior Anticoagulants: The patient has                            taken no previous anticoagulant or antiplatelet                            agents. ASA Grade Assessment: III - A patient with                            severe systemic disease. After reviewing the risks                            and benefits, the patient was deemed in                            satisfactory condition to undergo the procedure.  After obtaining informed consent, the endoscope was                            passed under direct vision. Throughout the                            procedure, the patient's blood pressure, pulse, and                            oxygen saturations were monitored continuously. The                            Endoscope was introduced through the mouth, and                             advanced to the third part of duodenum. The upper                            GI endoscopy was accomplished without difficulty.                            The patient tolerated the procedure well. Scope In: Scope Out: Findings:                 No endoscopic abnormality was evident in the                            esophagus to explain the patient's complaint of                            dysphagia. It was decided, however, to proceed with                            dilation in the distal esophagus. A TTS dilator was                            passed through the scope. Dilation with a 16-17-18                            mm balloon dilator was performed to 18 mm. The                            fully inflated balloon was pulled through the                            distal esophagus without resistance. The dilation                            site was examined and showed no change. After                            dilation, biopsies were obtained from the proximal  and distal esophagus. Estimated blood loss was                            minimal.                           The entire examined stomach was normal although                            J-shaped. Biopsies were taken from the antrum,                            body, and fundus with a cold forceps for histology.                            Estimated blood loss was minimal.                           The examined duodenum was normal.                           The cardia and gastric fundus were normal on                            retroflexion.                           The exam was otherwise without abnormality. Complications:            No immediate complications. Estimated blood loss:                            Minimal. Estimated Blood Loss:     Estimated blood loss was minimal. Impression:               - No endoscopic esophageal abnormality to explain                            patient's dysphagia. Esophagus  dilated. Biopsied.                           - Normal stomach. Biopsied given the recent H                            pylori testing.                           - Normal examined duodenum.                           - The examination was otherwise normal. Recommendation:           - Patient has a contact number available for                            emergencies. The signs and symptoms of potential  delayed complications were discussed with the                            patient. Return to normal activities tomorrow.                            Written discharge instructions were provided to the                            patient.                           - Resume previous diet.                           - Continue present medications.                           - Await pathology results. Thornton Park MD, MD 09/25/2021 8:38:05 AM This report has been signed electronically.

## 2021-09-25 NOTE — Progress Notes (Signed)
Pt's states no medical or surgical changes since previsit or office visit. 

## 2021-09-25 NOTE — Progress Notes (Signed)
Report to PACU, RN, vss, BBS= Clear.  

## 2021-09-25 NOTE — Progress Notes (Signed)
C.W. vital signs. 

## 2021-09-25 NOTE — Progress Notes (Signed)
Called to room to assist during endoscopic procedure.  Patient ID and intended procedure confirmed with present staff. Received instructions for my participation in the procedure from the performing physician.  

## 2021-09-25 NOTE — Progress Notes (Signed)
Referring Provider: Tammi Sou, MD Primary Care Physician:  Tammi Sou, MD  Reason for Procedure:  Dysphagia   IMPRESSION:  Dysphagia Recent H pylori IgG+ Appropriate candidate for monitored anesthesia care  PLAN: EGD in the [place] today   HPI: Erin [name] is a 86 y.o. female presents for endoscopic evaluation for dysphagia. See Janett Billow Zehr's note from 09/22/21 for full details. No significant change in history of physical exam since that time.     Past Medical History:  Diagnosis Date   Achilles tendon rupture    left, no surgery required   Chronic diastolic heart failure ([place])    Gross hematuria 12/2020   Klebsiella on urine clx x2 but no UTI sx's.  CT showed tiny stones in each renal pelvis but no ureteral or bladder stones, no mass.  Urol did cysto->normal. Plan is annual UA, rpt hematuria w/u in 3-5 yrs if persistent pos.   History of kidney stones    Hypertension    Hypertensive retinopathy of both eyes    Dr. Herbert Deaner   Low back pain    spondylosis, listhesis, +scoliosis at L/S jxn   Lower extremity edema    lasix prn   OA (osteoarthritis)    Osteopenia    Osteoporosis 07/2021   07/2021 T score -4.6   S/P mitral valve clip implantation 11/06/2020   s/p TEER with one XTW MitraClip on A2P2 by Dr. Burt Knack.  DAPT w/ASA and plavix x 3 mo post procedure recommended   Severe mitral regurgitation    2021 transthoracic echo and TEE->cardiology following.  Mitral valve clip 10/2020.  f/u echo 10/28/20 mild/mod MVR and tricusp regurg   Severe pulmonary hypertension ([place]) 2021   transth echo; moderate by TEE 73/5329   Systolic murmur 92/42/6834   severe mitral regurge, mod/severe tricuspic regurg    Past Surgical History:  Procedure Laterality Date   APPENDECTOMY     86 yrs old   [place]  2004   ruptured disc   CHOLECYSTECTOMY     age 50   CYSTOSCOPY  02/24/2021   (for gross hematuria) ->normal.   DEXA  07/2021   2022 T score -4.6 (radius)    LUMBAR LAMINECTOMY  2004   MITRAL VALVE REPAIR N/A 11/06/2020   Procedure: MITRAL VALVE REPAIR;  Surgeon: Sherren Mocha, MD;  Location: [facility] [place] [facility];  Service: Cardiovascular;  Laterality: N/A;   RIGHT/LEFT HEART CATH AND CORONARY ANGIOGRAPHY N/A 09/16/2020   No CAD. Procedure: RIGHT/LEFT HEART CATH AND CORONARY ANGIOGRAPHY;  Surgeon: Belva Crome, MD;  Location: [place] [facility];  Service: Cardiovascular;  Laterality: N/A;   TEE WITHOUT CARDIOVERSION N/A 09/16/2020   Procedure: TRANSESOPHAGEAL ECHOCARDIOGRAM (TEE);  Surgeon: Buford Dresser, MD;  Location: [facility];  Service: Cardiovascular;  Laterality: N/A;   TEE WITHOUT CARDIOVERSION N/A 11/06/2020   Procedure: TRANSESOPHAGEAL ECHOCARDIOGRAM (TEE);  Surgeon: Sherren Mocha, MD;  Location: [place] [facility];  Service: Cardiovascular;  Laterality: N/A;   TONSILLECTOMY     age 58   TOTAL KNEE ARTHROPLASTY Left 06/02/2021   Procedure: LEFT TOTAL KNEE ARTHROPLASTY;  Surgeon: Renette Butters, MD;  Location: [facility];  Service: Orthopedics;  Laterality: Left;   TRANSTHORACIC ECHOCARDIOGRAM  07/03/2020; 10/28/20; 12/03/20   06/2020 TTE EF 60-65%, grd II DD, severe pulm art HTN, severe mitral valve regurg, mod/sev tricuspic valve regurg. Conf on TEE 08/2020.  10/28/20 (s/p MV clip procedure) mild-mod MVR, EF 55-60%. 11/2020 EF 60%, mild/mod MR w/mean grad 25mm  hg and mod to sev TR.    Current Outpatient Medications  Medication Sig Dispense Refill   Ascorbic Acid (VITAMIN C PO) Take 1,000 mg by mouth daily.     carvedilol (COREG) 25 MG tablet Take 1 tablet (25 mg total) by mouth daily. 90 tablet 3   cholecalciferol (VITAMIN D) 25 MCG (1000 UNIT) tablet Take 1,000 Units by mouth daily.     gabapentin (NEURONTIN) 100 MG capsule Take 100 mg by mouth 3 (three) times daily.     ibandronate (BONIVA) 150 MG tablet Take 1 tablet (150 mg total) by mouth every 30 (thirty) days. Take in the morning with a full glass of water, on an empty  stomach, and do not take anything else by mouth or lie down for the next 30 min. 3 tablet 3   Multiple Vitamins-Minerals (PRESERVISION AREDS PO) Take 1 tablet by mouth in the morning and at bedtime.      Multiple Vitamins-Minerals (ZINC PO) Take 50 mg by mouth daily.     amoxicillin (AMOXIL) 500 MG capsule Take 1,000 mg by mouth 2 (two) times daily.     amoxicillin (AMOXIL) 500 MG tablet Take 4 tablets (2,000 mg) one hour prior to all dental visits. (Patient not taking: Reported on 08/11/2021) 8 tablet 11   furosemide (LASIX) 20 MG tablet Take 1 tablet (20 mg total) by mouth every Monday, Wednesday, and Friday. 36 tablet 0   Multiple Vitamin (MULTIVITAMIN WITH MINERALS) TABS tablet Take 1 tablet by mouth daily.     nitroGLYCERIN (NITRODUR - DOSED IN MG/24 HR) 0.2 mg/hr patch 1/2 patch over L Achilles tendon bid (Patient not taking: Reported on 09/22/2021) 30 patch 12   omeprazole (PRILOSEC) 20 MG capsule Take 20 mg by mouth daily.     potassium chloride (KLOR-CON) 10 MEQ tablet Take 10 mEq by mouth daily as needed (when taking lasix).     sodium chloride (MURO 128) 5 % ophthalmic solution Place 1 drop into both eyes at bedtime.     traZODone (DESYREL) 50 MG tablet 1-2 tabs po qhs as needed for insomnia 30 tablet 1   vitamin E 180 MG (400 UNITS) capsule Take 400 Units by mouth daily.     Current Facility-Administered Medications  Medication Dose Route Frequency Provider Last Rate Last Admin   0.9 %  sodium chloride infusion  500 mL Intravenous Once Thornton Park, MD        Allergies as of 09/25/2021 - Review Complete 09/25/2021  Allergen Reaction Noted   Barbiturates Nausea And Vomiting    Demerol Other (See Comments) 01/12/2011   Hydrochlorothiazide Other (See Comments)    Irbesartan  11/21/2009   Losartan potassium  11/19/2010   Olmesartan medoxomil  07/14/2009   Telmisartan  11/11/2008    Family History  Problem Relation Age of Onset   Hypertension Mother    Early death Father     Cancer Father    Liver cancer Father    Hypertension Other    Colon cancer Neg Hx    Rectal cancer Neg Hx      Physical Exam: General:   Alert,  well-nourished, pleasant and cooperative in NAD Head:  Normocephalic and atraumatic. Eyes:  Sclera clear, no icterus.   Conjunctiva pink. Mouth:  No deformity or lesions.   Neck:  Supple; no masses or thyromegaly. Lungs:  Clear throughout to auscultation.   No wheezes. Heart:  Regular rate and rhythm; no murmurs. Abdomen:  Soft, non-tender, nondistended, normal bowel sounds, no  rebound or guarding.  Msk:  Symmetrical. No boney deformities LAD: No inguinal or umbilical LAD Extremities:  No clubbing or edema. Neurologic:  Alert and  oriented x4;  grossly nonfocal Skin:  No obvious rash or bruise. Psych:  Alert and cooperative. Normal mood and affect.      Halona Amstutz L. Tarri Glenn, MD, MPH 09/25/2021, 8:08 AM

## 2021-09-25 NOTE — Patient Instructions (Signed)
YOU HAD AN ENDOSCOPIC PROCEDURE TODAY AT THE [facility]:   Refer to the procedure report that was given to you for any specific questions about what was found during the examination.  If the procedure report does not answer your questions, please call your gastroenterologist to clarify.  If you requested that your care partner not be given the details of your procedure findings, then the procedure report has been included in a sealed envelope for you to review at your convenience later.  YOU SHOULD EXPECT: Some feelings of bloating in the abdomen. Passage of more gas than usual.  Walking can help get rid of the air that was put into your GI tract during the procedure and reduce the bloating. If you had a lower endoscopy (such as a colonoscopy or flexible sigmoidoscopy) you may notice spotting of blood in your stool or on the toilet paper. If you underwent a bowel prep for your procedure, you may not have a normal bowel movement for a few days.  Please Note:  You might notice some irritation and congestion in your nose or some drainage.  This is from the oxygen used during your procedure.  There is no need for concern and it should clear up in a day or so.  SYMPTOMS TO REPORT IMMEDIATELY:    Following upper endoscopy (EGD)  Vomiting of blood or coffee ground material  New chest pain or pain under the shoulder blades  Painful or persistently difficult swallowing  New shortness of breath  Fever of 100F or higher  Black, tarry-looking stools  For urgent or emergent issues, a gastroenterologist can be reached at any hour by calling [phone]. Do not use MyChart messaging for urgent concerns.    DIET:  We do recommend a small meal at first, but then you may proceed to your regular diet.  Drink plenty of fluids but you should avoid alcoholic beverages for 24 hours.  ACTIVITY:  You should plan to take it easy for the rest of today and you should NOT DRIVE or use heavy machinery  until tomorrow (because of the sedation medicines used during the test).    FOLLOW UP: Our staff will call the number listed on your records 48-72 hours following your procedure to check on you and address any questions or concerns that you may have regarding the information given to you following your procedure. If we do not reach you, we will leave a message.  We will attempt to reach you two times.  During this call, we will ask if you have developed any symptoms of COVID 19. If you develop any symptoms (ie: fever, flu-like symptoms, shortness of breath, cough etc.) before then, please call [phone].  If you test positive for Covid 19 in the 2 weeks post procedure, please call and report this information to us.    If any biopsies were taken you will be contacted by phone or by letter within the next 1-3 weeks.  Please call us at [phone] if you have not heard about the biopsies in 3 weeks.    SIGNATURES/CONFIDENTIALITY: You and/or your care partner have signed paperwork which will be entered into your electronic medical record.  These signatures attest to the fact that that the information above on your After Visit Summary has been reviewed and is understood.  Full responsibility of the confidentiality of this discharge information lies with you and/or your care-partner. 

## 2021-09-28 ENCOUNTER — Ambulatory Visit: Payer: Medicare Other | Admitting: Family Medicine

## 2021-09-29 ENCOUNTER — Telehealth: Payer: Self-pay | Admitting: *Deleted

## 2021-09-29 NOTE — Telephone Encounter (Signed)
  Follow up Call-  Call back number 09/25/2021  Post procedure Call Back phone  # [phone]  Permission to leave phone message Yes  Some recent data might be hidden     Patient questions:  Do you have a fever, pain , or abdominal swelling? No. Pain Score  0 *  Have you tolerated food without any problems? Yes.    Have you been able to return to your normal activities? Yes.    Do you have any questions about your discharge instructions: Diet   No. Medications  No. Follow up visit  No.  Do you have questions or concerns about your Care? No.  Actions: * If pain score is 4 or above: No action needed, pain <4.  Have you developed a fever since your procedure? no  2.   Have you had an respiratory symptoms (SOB or cough) since your procedure? no  3.   Have you tested positive for COVID 19 since your procedure no  4.   Have you had any family members/close contacts diagnosed with the COVID 19 since your procedure?  no   If yes to any of these questions please route to Joylene John, RN and Joella Prince, RN

## 2021-10-01 DIAGNOSIS — R262 Difficulty in walking, not elsewhere classified: Secondary | ICD-10-CM | POA: Diagnosis not present

## 2021-10-01 DIAGNOSIS — Z4789 Encounter for other orthopedic aftercare: Secondary | ICD-10-CM | POA: Diagnosis not present

## 2021-10-01 DIAGNOSIS — R531 Weakness: Secondary | ICD-10-CM | POA: Diagnosis not present

## 2021-10-01 DIAGNOSIS — M25562 Pain in left knee: Secondary | ICD-10-CM | POA: Diagnosis not present

## 2021-10-01 DIAGNOSIS — R600 Localized edema: Secondary | ICD-10-CM | POA: Diagnosis not present

## 2021-10-01 DIAGNOSIS — Z96652 Presence of left artificial knee joint: Secondary | ICD-10-CM | POA: Diagnosis not present

## 2021-10-06 ENCOUNTER — Other Ambulatory Visit: Payer: Self-pay

## 2021-10-06 DIAGNOSIS — K209 Esophagitis, unspecified without bleeding: Secondary | ICD-10-CM

## 2021-10-06 MED ORDER — PANTOPRAZOLE SODIUM 40 MG PO TBEC: 40.0000 mg | DELAYED_RELEASE_TABLET | Freq: Two times a day (BID) | ORAL | 0 refills | Status: DC

## 2021-10-12 ENCOUNTER — Telehealth: Payer: Self-pay | Admitting: Gastroenterology

## 2021-10-12 ENCOUNTER — Other Ambulatory Visit: Payer: Self-pay

## 2021-10-12 NOTE — Telephone Encounter (Signed)
Patient called stating she cannot tolerate the pantoparazole as it gives her a stomach ache, makes her very gassy, and makes her go to the bathroom.  She is requesting something else she can take where she doesn't have to take it on an empty stomach.  Please call patient and advise.  Thank you.

## 2021-10-12 NOTE — Telephone Encounter (Signed)
Called pt to inquire further about her symptoms. States , "I just can't do it honey, it's just too much". Asked pt to describe her symptoms to which pt states, "I'm just not going to take the medication, it's just too much." Reviewed and updated MAR with pt. Pt continued to state, "Dr. Anitra Lauth Rx'd omeprazole in the past and I couldn't take it either." Pt further added, "that's why he told me to come see Dr. Tarri Glenn." Again, attempted to inquire further about her symptoms but pt still continued to reiterate, I'm just not going to take it. Advised pt that Dr. Tarri Glenn is out of the office this week. Unfortunately, since pt could not elaborate further about her symptoms, this was not deemed urgent nor emergent. Advised she will either need to call her PCP to their advice or wait until Dr. Tarri Glenn returns. Pt states she will follow up with her PCP and if PCP advises she is needing to be seen by Dr. Tarri Glenn, will await her return.

## 2021-11-03 NOTE — Progress Notes (Deleted)
[facility] [place]                                     Cardiology Office Note:    Date:  11/03/2021   ID:  Erin [name], DOB 1935/05/07, MRN [id number]  PCP:  Tammi Sou, MD  [facility] Cardiologist:  Dr. [facility]inal[facility] Electrophysiologist:  None   Referring MD: Tammi Sou, MD   1 year s/p MitraClip  History of Present Illness:    Erin [name] is a 87 y.o. female with a hx of HTN, OA, chronic diastolic CHF, mitral valve prolapse with severe MR s/p MitraClip (11/06/20) who presents to clinic for follow up.   Echo 07/03/2020 showed EF 60 to 65% with mitral valve prolapse with what appeared to be partially flail segment of the middle scallop of the posterior leaflet causing severe mitral regurgitation. She reported progressive symptoms of exertional shortness of breath despite initiation of oral diuretics. She was referred for formal cardiology evaluation and initially seen by Dr. Tamala Julian 08/27/2020. TEE and diagnostic cardiac catheterization were performed September 16, 2020.  TEE confirmed the presence of myxomatous degenerative disease with partially flail segment involving the middle scallop (P2) of the posterior leaflet and severe mitral regurgitation. There was felt to be moderate to severe tricuspid regurgitation. L/RHC showed no significant coronary artery disease. There was mild pulmonary hypertension with large V waves on wedge tracing consistent with severe mitral regurgitation.    She was evaluated by the multidisciplinary valve team and underwent successful transcatheter edge-to-edge repair of the mitral valve with a MitraClip XTW device, placed A2/P2, reducing MR from 4+ at baseline to 1-2+ post-procedure. Post op echo showed mild to moderate eccentric MR with mean gradient of 4 mm hg.. She was started on aspirin and plavix, which will be continued x 3 months followed by aspirin alone, indefinitely. 1 month echo  showed EF 60%, mild-mod residual MR with mean gradient of 4mm hg and moderate to severe TR and RSVP 45.94mmhg.   Today she presents to clinic for follow up.   Past Medical History:  Diagnosis Date   Achilles tendon rupture    left, no surgery required   Chronic diastolic heart failure ([place])    Gross hematuria 12/2020   Klebsiella on urine clx x2 but no UTI sx's.  CT showed tiny stones in each renal pelvis but no ureteral or bladder stones, no mass.  Urol did cysto->normal. Plan is annual UA, rpt hematuria w/u in 3-5 yrs if persistent pos.   History of kidney stones    Hypertension    Hypertensive retinopathy of both eyes    Dr. Herbert Deaner   Low back pain    spondylosis, listhesis, +scoliosis at L/S jxn   Lower extremity edema    lasix prn   OA (osteoarthritis)    Osteopenia    Osteoporosis 07/2021   07/2021 T score -4.6   S/P mitral valve clip implantation 11/06/2020   s/p TEER with one XTW MitraClip on A2P2 by Dr. Burt Knack.  DAPT w/ASA and plavix x 3 mo post procedure recommended   Severe mitral regurgitation    2021 transthoracic echo and TEE->cardiology following.  Mitral valve clip 10/2020.  f/u echo 10/28/20 mild/mod MVR and tricusp regurg   Severe pulmonary hypertension ([place]) 2021   transth echo; moderate by TEE 08/2020  Systolic murmur 19/14/7829   severe mitral regurge, mod/severe tricuspic regurg    Past Surgical History:  Procedure Laterality Date   APPENDECTOMY     87 yrs old   BACK SURGERY  2004   ruptured disc   CHOLECYSTECTOMY     age 78   CYSTOSCOPY  02/24/2021   (for gross hematuria) ->normal.   DEXA  07/2021   2022 T score -4.6 (radius)   LUMBAR LAMINECTOMY  2004   MITRAL VALVE REPAIR N/A 11/06/2020   Procedure: MITRAL VALVE REPAIR;  Surgeon: Sherren Mocha, MD;  Location: [place] [facility];  Service: Cardiovascular;  Laterality: N/A;   RIGHT/LEFT HEART CATH AND CORONARY ANGIOGRAPHY N/A 09/16/2020   No CAD. Procedure: RIGHT/LEFT HEART CATH AND CORONARY  ANGIOGRAPHY;  Surgeon: Belva Crome, MD;  Location: [place] [facility];  Service: Cardiovascular;  Laterality: N/A;   TEE WITHOUT CARDIOVERSION N/A 09/16/2020   Procedure: TRANSESOPHAGEAL ECHOCARDIOGRAM (TEE);  Surgeon: Buford Dresser, MD;  Location: [facility];  Service: Cardiovascular;  Laterality: N/A;   TEE WITHOUT CARDIOVERSION N/A 11/06/2020   Procedure: TRANSESOPHAGEAL ECHOCARDIOGRAM (TEE);  Surgeon: Sherren Mocha, MD;  Location: [place] [facility];  Service: Cardiovascular;  Laterality: N/A;   TONSILLECTOMY     age 66   TOTAL KNEE ARTHROPLASTY Left 06/02/2021   Procedure: LEFT TOTAL KNEE ARTHROPLASTY;  Surgeon: Renette Butters, MD;  Location: [facility];  Service: Orthopedics;  Laterality: Left;   TRANSTHORACIC ECHOCARDIOGRAM  07/03/2020; 10/28/20; 12/03/20   06/2020 TTE EF 60-65%, grd II DD, severe pulm art HTN, severe mitral valve regurg, mod/sev tricuspic valve regurg. Conf on TEE 08/2020.  10/28/20 (s/p MV clip procedure) mild-mod MVR, EF 55-60%. 11/2020 EF 60%, mild/mod MR w/mean grad 51mm hg and mod to sev TR.    Current Medications: No outpatient medications have been marked as taking for the 11/04/21 encounter (Appointment) with Eileen Stanford, PA-C.     Allergies:   Barbiturates, Demerol, Hydrochlorothiazide, Irbesartan, Losartan potassium, Olmesartan medoxomil, and Telmisartan   Social History   Socioeconomic History   Marital status: Married    Spouse name: Not on file   Number of children: Not on file   Years of education: Not on file   Highest education level: Not on file  Occupational History   Not on file  Tobacco Use   Smoking status: Never   Smokeless tobacco: Never  Vaping Use   Vaping Use: Never used  Substance and Sexual Activity   Alcohol use: No   Drug use: Never   Sexual activity: Not Currently  Other Topics Concern   Not on file  Social History Narrative   Married, 6 children, about 98 GC.  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   No tob.   No alc.   Social Determinants of Health   Financial Resource Strain: Low Risk    Difficulty of Paying Living Expenses: Not hard at all  Food Insecurity: No Food Insecurity   Worried About Charity fundraiser in the Last Year: Never true   [place] in the Last Year: Never true  Transportation Needs: No Transportation Needs   Lack of Transportation (Medical): No   Lack of Transportation (Non-Medical): No  Physical Activity: Sufficiently Active   Days of Exercise per Week: 7 days   Minutes of Exercise per Session: 30 min  Stress: No Stress Concern  Present   Feeling of Stress : Not at all  Social Connections: Socially Integrated   Frequency of Communication with Friends and Family: More than three times a week   Frequency of Social Gatherings with Friends and Family: More than three times a week   Attends Religious Services: More than 4 times per year   Active Member of [employer] or Organizations: Yes   Attends Music therapist: More than 4 times per year   Marital Status: Married     Family History: The patient's family history includes Cancer in her father; Early death in her father; Hypertension in her mother and another family member; Liver cancer in her father. There is no history of Colon cancer or Rectal cancer.  ROS:   Please see the history of present illness.    All other systems reviewed and are negative.  EKGs/Labs/Other Studies Reviewed:    The following studies were reviewed today:  TEER 11/06/20 MITRAL VALVE REPAIR  Conclusion Successful transcatheter edge-to-edge repair of the mitral valve with a MitraClip XTW device, placed A2/P2, reducing MR from 4+ at baseline to 1-2+ post-procedure   Recommendations   Antiplatelet/Anticoag Recommend uninterrupted dual antiplatelet therapy with Aspirin 81mg  daily and Clopidogrel 75mg  daily. 3 months DAPT with ASA and plavix       _____________    Echo 11/07/2020: IMPRESSIONS   1. S/P XTW MitraClip at A2-P2 location with mild to moderate eccentric  mitral regurgitation, without pulmonary vein reversal. The mitral valve  has been repaired/replaced. Mild to moderate mitral valve regurgitation.  The mean mitral valve gradient is 4.0   mmHg with average heart rate of 49 bpm.   2. Left ventricular ejection fraction, by estimation, is 55 to 60%. The  left ventricle has normal function. The left ventricle has no regional  wall motion abnormalities. Left ventricular diastolic parameters are  indeterminate.   3. Right ventricular systolic function is normal. The right ventricular  size is normal. There is mildly elevated pulmonary artery systolic  pressure.   4. Left atrial size was severely dilated.   5. Right atrial size was moderately dilated.   6. Tricuspid valve regurgitation is moderate.   7. The aortic valve is normal in structure. Aortic valve regurgitation is  trivial.   8. The inferior vena cava is normal in size with greater than 50%  respiratory variability, suggesting right atrial pressure of 3 mmHg.   9. Evidence of atrial level shunting detected by color flow Doppler.   Comparison(s): A prior study was performed on 11/06/20. No significant  change from prior study (procedural TEE).    _________________  Echo 12/03/2020 IMPRESSIONS   1. Left ventricular ejection fraction, by estimation, is 60 to 65%. The  left ventricle has normal function. The left ventricle has no regional  wall motion abnormalities. Left ventricular diastolic function could not  be evaluated.   2. Right ventricular systolic function is normal. The right ventricular  size is normal. There is moderately elevated pulmonary artery systolic  pressure. The estimated right ventricular systolic pressure is 42.3 mmHg.   3. Left atrial size was severely dilated.   4. Residual iatrogenic ASD from trans-septal puncture is seen. There is a   small secundum atrial septal defect with exclusively left to right  shunting across the atrial septum.   5. Residual mitral insufficiency originates primarily lateral to the  Mitraclip, which is located A2-P2. The jet is oriented anteriorly and  towards the septum. The mitral valve has been repaired/replaced.  Mild to  moderate mitral valve regurgitation. Mild   mitral stenosis. The mean mitral valve gradient is 4.0 mmHg with average  heart rate of 66 bpm.   6. Tricuspid valve regurgitation is moderate to severe.   7. The aortic valve is tricuspid. Aortic valve regurgitation is trivial.  Mild aortic valve sclerosis is present, with no evidence of aortic valve  stenosis.   8. The inferior vena cava is normal in size with <50% respiratory  variability, suggesting right atrial pressure of 8 mmHg.   9. Right atrial size was mildly dilated.   Comparison(s): No significant change from prior study. Prior images  reviewed side by side.   EKG:  EKG is NOT ordered today.    Recent Labs: 07/22/2021: ALT 6; Potassium 4.3; Sodium 143 08/13/2021: BUN 22; Creatinine, Ser 0.63 09/02/2021: Hemoglobin 12.1; Platelets 278.0  Recent Lipid Panel    Component Value Date/Time   CHOL 139 07/02/2020 1057   TRIG 30.0 07/02/2020 1057   TRIG 33 09/21/2006 1115   HDL 80.30 07/02/2020 1057   CHOLHDL 2 07/02/2020 1057   VLDL 6.0 07/02/2020 1057   LDLCALC 53 07/02/2020 1057     Risk Assessment/Calculations:       Physical Exam:    VS:  There were no vitals taken for this visit.    Wt Readings from Last 3 Encounters:  09/25/21 118 lb (53.5 kg)  09/22/21 118 lb (53.5 kg)  09/02/21 117 lb 12.8 oz (53.4 kg)     GEN: Well nourished, well developed in no acute distress HEENT: Normal NECK: No JVD; No carotid bruits LYMPHATICS: No lymphadenopathy CARDIAC: RRR, 2/6 holosystolic murmur at apex. No rubs, gallops RESPIRATORY:  Clear to auscultation without rales, wheezing or rhonchi  ABDOMEN: Soft,  non-tender, non-distended MUSCULOSKELETAL:  No edema; No deformity  SKIN: Warm and dry NEUROLOGIC:  Alert and oriented x 3 PSYCHIATRIC:  Normal affect   ASSESSMENT:    1. S/P mitral valve clip implantation   2. Chronic diastolic heart failure ([place])   3. Essential hypertension     PLAN:    In order of problems listed above:  Mitral valve prolapse with severe MR s/p TEER:   SBE prophylaxis discussed; I have RX'd amoxicillin. I will see her back in 1 year with an echo.   HTN:  Chronic diastolic CHF:   Medication Adjustments/Labs and Tests Ordered: Current medicines are reviewed at length with the patient today.  Concerns regarding medicines are outlined above.  No orders of the defined types were placed in this encounter.  No orders of the defined types were placed in this encounter.   There are no Patient Instructions on file for this visit.   Signed, [name], PA-C  11/03/2021 7:13 PM     [facility]

## 2021-11-04 ENCOUNTER — Ambulatory Visit (HOSPITAL_COMMUNITY): Payer: Medicare Other | Attending: Cardiology

## 2021-11-04 ENCOUNTER — Other Ambulatory Visit: Payer: Self-pay

## 2021-11-04 ENCOUNTER — Ambulatory Visit: Payer: Medicare Other | Admitting: Physician Assistant

## 2021-11-04 DIAGNOSIS — Z9889 Other specified postprocedural states: Secondary | ICD-10-CM | POA: Insufficient documentation

## 2021-11-04 DIAGNOSIS — I340 Nonrheumatic mitral (valve) insufficiency: Secondary | ICD-10-CM | POA: Insufficient documentation

## 2021-11-04 DIAGNOSIS — Z95818 Presence of other cardiac implants and grafts: Secondary | ICD-10-CM | POA: Insufficient documentation

## 2021-11-04 DIAGNOSIS — Z954 Presence of other heart-valve replacement: Secondary | ICD-10-CM

## 2021-11-04 DIAGNOSIS — I10 Essential (primary) hypertension: Secondary | ICD-10-CM

## 2021-11-04 DIAGNOSIS — I5032 Chronic diastolic (congestive) heart failure: Secondary | ICD-10-CM

## 2021-11-04 LAB — ECHOCARDIOGRAM COMPLETE
Area-P 1/2: 1.83 cm2
MV M vel: 4.27 m/s
MV Peak grad: 72.8 mmHg
MV VTI: 1.23 cm2
S' Lateral: 2.9 cm

## 2021-11-09 ENCOUNTER — Telehealth: Payer: Self-pay | Admitting: Cardiovascular Disease

## 2021-11-09 NOTE — Telephone Encounter (Signed)
Spoke with the patient and advised her that she will need to take amoxicillin 2,000mg  one hour prior to her dental visit. Patient verbalized understanding.

## 2021-11-09 NOTE — Telephone Encounter (Signed)
Patient calling to find out if she can have a dentist appointment tomorrow since her echo showed moderate to severe mitral valve damage. She says the dentist office is not requiring clearance, she just wants to know.

## 2021-11-10 ENCOUNTER — Ambulatory Visit: Payer: Medicare Other | Admitting: Gastroenterology

## 2021-11-16 ENCOUNTER — Ambulatory Visit: Payer: Medicare Other | Admitting: Cardiovascular Disease

## 2021-11-25 ENCOUNTER — Ambulatory Visit (INDEPENDENT_AMBULATORY_CARE_PROVIDER_SITE_OTHER): Payer: Medicare Other | Admitting: Cardiovascular Disease

## 2021-11-25 ENCOUNTER — Encounter: Payer: Self-pay | Admitting: Cardiovascular Disease

## 2021-11-25 ENCOUNTER — Other Ambulatory Visit: Payer: Self-pay

## 2021-11-25 VITALS — BP 138/76 | HR 56 | Ht 61.0 in | Wt 119.0 lb

## 2021-11-25 DIAGNOSIS — Z95818 Presence of other cardiac implants and grafts: Secondary | ICD-10-CM | POA: Diagnosis not present

## 2021-11-25 DIAGNOSIS — I340 Nonrheumatic mitral (valve) insufficiency: Secondary | ICD-10-CM

## 2021-11-25 DIAGNOSIS — Z9889 Other specified postprocedural states: Secondary | ICD-10-CM

## 2021-11-25 MED ORDER — NITROGLYCERIN 0.2 MG/HR TD PT24: MEDICATED_PATCH | TRANSDERMAL | 12 refills | Status: DC

## 2021-11-25 MED ORDER — ASPIRIN EC 81 MG PO TBEC: 81.0000 mg | DELAYED_RELEASE_TABLET | Freq: Every day | ORAL | 3 refills | Status: DC

## 2021-11-25 NOTE — Patient Instructions (Addendum)
Medication Instructions:  START Aspirin 81mg  Your physician recommends that you continue on your current medications as directed. Please refer to the Current Medication list given to you today.  *If you need a refill on your cardiac medications before your next appointment, please call your pharmacy*   Lab Work: NONE If you have labs (blood work) drawn today and your tests are completely normal, you will receive your results only by: [place] (if you have MyChart) OR A paper copy in the mail If you have any lab test that is abnormal or we need to change your treatment, we will call you to review the results.   Testing/Procedures: ECHO (same day appt) Your physician has requested that you have an echocardiogram. Echocardiography is a painless test that uses sound waves to create images of your heart. It provides your doctor with information about the size and shape of your heart and how well your hearts chambers and valves are working. This procedure takes approximately one hour. There are no restrictions for this procedure.    Follow-Up: At [facility], you and your health needs are our priority.  As part of our continuing mission to provide you with exceptional heart care, we have created designated Provider Care Teams.  These Care Teams include your primary Cardiologist (physician) and Advanced Practice Providers (APPs -  Physician Assistants and Nurse Practitioners) who all work together to provide you with the care you need, when you need it.   Your next appointment:   6 month(s)  The format for your next appointment:   In Person  Provider:   Sherren Mocha  Other Instructions Thank you for allowing myself and [facility] [place] to serve you, God Bless!!

## 2021-11-25 NOTE — Progress Notes (Signed)
Cardiology Office Note:    Date:  12/01/2021   ID:  Erin [name], DOB 07-06-1935, MRN [id number]  PCP:  Tammi Sou, MD   [facility] Providers Cardiologist:  Sinclair Grooms, MD     Referring MD: Tammi Sou, MD   Chief Complaint  Patient presents with   Mitral Regurgitation    History of Present Illness:    Erin [name] is a 87 y.o. female with a hx of severe primary mitral regurgitation status post transcatheter edge-to-edge mitral valve repair with MitraClip November 06, 2020.  The patient initially had a good result with reduction in her mitral regurgitation to the mild to moderate range (1-2+).  She did well clinically for several months but recently was found to have moderate to severe mitral regurgitation on follow-up echo assessment.  The patient is here alone today.  She remains functionally independent.  She does experience mild fatigue and shortness of breath with activity.  She has had a few episodes of orthopnea.  Overall she feels fairly well and denies edema, PND, heart palpitations, lightheadedness, or syncope.  She has had no recent problems with chest pain.  Past Medical History:  Diagnosis Date   Achilles tendon rupture    left, no surgery required   Chronic diastolic heart failure ([place])    Gross hematuria 12/2020   Klebsiella on urine clx x2 but no UTI sx's.  CT showed tiny stones in each renal pelvis but no ureteral or bladder stones, no mass.  Urol did cysto->normal. Plan is annual UA, rpt hematuria w/u in 3-5 yrs if persistent pos.   History of kidney stones    Hypertension    Hypertensive retinopathy of both eyes    Dr. Herbert Deaner   Low back pain    spondylosis, listhesis, +scoliosis at L/S jxn   Lower extremity edema    lasix prn   OA (osteoarthritis)    Osteopenia    Osteoporosis 07/2021   07/2021 T score -4.6   S/P mitral valve clip implantation 11/06/2020   s/p TEER with one XTW MitraClip on A2P2 by Dr. Burt Knack.  DAPT w/ASA and  plavix x 3 mo post procedure recommended   Severe mitral regurgitation    2021 transthoracic echo and TEE->cardiology following.  Mitral valve clip 10/2020.  f/u echo 10/28/20 mild/mod MVR and tricusp regurg   Severe pulmonary hypertension ([place]) 2021   transth echo; moderate by TEE 78/2956   Systolic murmur 21/30/8657   severe mitral regurge, mod/severe tricuspic regurg    Past Surgical History:  Procedure Laterality Date   APPENDECTOMY     87 yrs old   [place]  2004   ruptured disc   CHOLECYSTECTOMY     age 47   CYSTOSCOPY  02/24/2021   (for gross hematuria) ->normal.   DEXA  07/2021   2022 T score -4.6 (radius)   LUMBAR LAMINECTOMY  2004   MITRAL VALVE REPAIR N/A 11/06/2020   Procedure: MITRAL VALVE REPAIR;  Surgeon: Sherren Mocha, MD;  Location: [place] [facility];  Service: Cardiovascular;  Laterality: N/A;   RIGHT/LEFT HEART CATH AND CORONARY ANGIOGRAPHY N/A 09/16/2020   No CAD. Procedure: RIGHT/LEFT HEART CATH AND CORONARY ANGIOGRAPHY;  Surgeon: Belva Crome, MD;  Location: [place] [facility];  Service: Cardiovascular;  Laterality: N/A;   TEE WITHOUT CARDIOVERSION N/A 09/16/2020   Procedure: TRANSESOPHAGEAL ECHOCARDIOGRAM (TEE);  Surgeon: Buford Dresser, MD;  Location: [place];  Service: Cardiovascular;  Laterality: N/A;  TEE WITHOUT CARDIOVERSION N/A 11/06/2020   Procedure: TRANSESOPHAGEAL ECHOCARDIOGRAM (TEE);  Surgeon: Sherren Mocha, MD;  Location: [place] [facility];  Service: Cardiovascular;  Laterality: N/A;   TONSILLECTOMY     age 74   TOTAL KNEE ARTHROPLASTY Left 06/02/2021   Procedure: LEFT TOTAL KNEE ARTHROPLASTY;  Surgeon: Renette Butters, MD;  Location: [facility];  Service: Orthopedics;  Laterality: Left;   TRANSTHORACIC ECHOCARDIOGRAM  07/03/2020; 10/28/20; 12/03/20   06/2020 TTE EF 60-65%, grd II DD, severe pulm art HTN, severe mitral valve regurg, mod/sev tricuspic valve regurg. Conf on TEE 08/2020.  10/28/20 (s/p MV clip procedure) mild-mod MVR,  EF 55-60%. 11/2020 EF 60%, mild/mod MR w/mean grad 35mm hg and mod to sev TR.    Current Medications: Current Meds  Medication Sig   amoxicillin (AMOXIL) 500 MG tablet Take 4 tablets (2,000 mg) one hour prior to all dental visits.   aspirin EC 81 MG tablet Take 1 tablet (81 mg total) by mouth daily. Swallow whole.   carvedilol (COREG) 25 MG tablet Take 1 tablet (25 mg total) by mouth daily.   cholecalciferol (VITAMIN D) 25 MCG (1000 UNIT) tablet Take 1,000 Units by mouth daily.   furosemide (LASIX) 20 MG tablet Take 1 tablet (20 mg total) by mouth daily as needed for fluid.   gabapentin (NEURONTIN) 300 MG capsule Take 300 mg by mouth at bedtime.   Multiple Vitamin (MULTIVITAMIN WITH MINERALS) TABS tablet Take 1 tablet by mouth daily.   Multiple Vitamins-Minerals (PRESERVISION AREDS PO) Take 1 tablet by mouth in the morning and at bedtime.    Multiple Vitamins-Minerals (ZINC PO) Take 50 mg by mouth daily.   potassium chloride (KLOR-CON) 10 MEQ tablet Take 10 mEq by mouth daily as needed (when taking lasix).   sodium chloride (MURO 128) 5 % ophthalmic solution Place 1 drop into both eyes at bedtime.   vitamin E 180 MG (400 UNITS) capsule Take 400 Units by mouth daily.   [DISCONTINUED] nitroGLYCERIN (NITRODUR - DOSED IN MG/24 HR) 0.2 mg/hr patch 1/2 patch over L Achilles tendon bid     Allergies:   Barbiturates, Demerol, Hydrochlorothiazide, Irbesartan, Losartan potassium, Olmesartan medoxomil, and Telmisartan   Social History   Socioeconomic History   Marital status: Married    Spouse name: Not on file   Number of children: Not on file   Years of education: Not on file   Highest education level: Not on file  Occupational History   Not on file  Tobacco Use   Smoking status: Never   Smokeless tobacco: Never  Vaping Use   Vaping Use: Never used  Substance and Sexual Activity   Alcohol use: No   Drug use: Never   Sexual activity: Not Currently  Other Topics Concern   Not on file   Social History Narrative   Married, 6 children, about 58 GC.  8 [place]   Former Network engineer at [employer].  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   No tob.   No alc.   Social Determinants of Health   Financial Resource Strain: Low Risk    Difficulty of Paying Living Expenses: Not hard at all  Food Insecurity: No Food Insecurity   Worried About Charity fundraiser in the Last Year: Never true   [place] in the Last Year: Never true  Transportation Needs: No Transportation Needs   Lack of Transportation (Medical): No   Lack of Transportation (  Non-Medical): No  Physical Activity: Sufficiently Active   Days of Exercise per Week: 7 days   Minutes of Exercise per Session: 30 min  Stress: No Stress Concern Present   Feeling of Stress : Not at all  Social Connections: Socially Integrated   Frequency of Communication with Friends and Family: More than three times a week   Frequency of Social Gatherings with Friends and Family: More than three times a week   Attends Religious Services: More than 4 times per year   Active Member of [employer] or Organizations: Yes   Attends Music therapist: More than 4 times per year   Marital Status: Married     Family History: The patient's family history includes Cancer in her father; Early death in her father; Hypertension in her mother and another family member; Liver cancer in her father. There is no history of Colon cancer or Rectal cancer.  ROS:   Please see the history of present illness.    All other systems reviewed and are negative.  EKGs/Labs/Other Studies Reviewed:    The following studies were reviewed today: Echo 11/04/21: 1. Left ventricular ejection fraction, by estimation, is 60 to 65%. The  left ventricle has normal function. The left ventricle has no regional  wall motion abnormalities. Left ventricular diastolic parameters are  indeterminate.   2. Peak RV-RA gradient 34 mmHg. The IVC was  not visualized. Right  ventricular systolic function is normal. The right ventricular size is  normal.   3. Left atrial size was severely dilated.   4. Right atrial size was mildly dilated.   5. Mitral valve repair s/p Mitraclip in A2-P2 position. Mean gradient 5  mmHg, suggesting mild stenosis (though gradient likely elevated by  significant residual mitral regurgitation). There is moderate-severe  mitral regurgitation that appears to  originate laterally to the Mitraclip and is directed anteriorly.   6. The tricuspid valve is abnormal. Tricuspid valve regurgitation is  moderate.   7. The aortic valve is tricuspid. Aortic valve regurgitation is not  visualized. No aortic stenosis is present.   8. Small ASD from prior septal puncture with left to right flow.   Recent Labs: 07/22/2021: ALT 6; Potassium 4.3; Sodium 143 08/13/2021: BUN 22; Creatinine, Ser 0.63 09/02/2021: Hemoglobin 12.1; Platelets 278.0  Recent Lipid Panel    Component Value Date/Time   CHOL 139 07/02/2020 1057   TRIG 30.0 07/02/2020 1057   TRIG 33 09/21/2006 1115   HDL 80.30 07/02/2020 1057   CHOLHDL 2 07/02/2020 1057   VLDL 6.0 07/02/2020 1057   LDLCALC 53 07/02/2020 1057     Risk Assessment/Calculations:           Physical Exam:    VS:  BP 138/76    Pulse (!) 56    Ht 5\' 1"  (1.549 m)    Wt 119 lb (54 kg)    SpO2 97%    BMI 22.48 kg/m     Wt Readings from Last 3 Encounters:  11/25/21 119 lb (54 kg)  09/25/21 118 lb (53.5 kg)  09/22/21 118 lb (53.5 kg)     GEN: Pleasant elderly woman, well nourished, well developed in no acute distress HEENT: Normal NECK: No JVD; No carotid bruits LYMPHATICS: No lymphadenopathy CARDIAC: RRR, 3/6 holosystolic murmur best heard at the apex RESPIRATORY:  Clear to auscultation without rales, wheezing or rhonchi  ABDOMEN: Soft, non-tender, non-distended MUSCULOSKELETAL:  No edema; No deformity  SKIN: Warm and dry NEUROLOGIC:  Alert and oriented x 3  PSYCHIATRIC:   Normal affect   ASSESSMENT:    1. S/P mitral valve clip implantation   2. Non-rheumatic mitral regurgitation    PLAN:    In order of problems listed above:  The patient is clinically stable New York Heart Association functional class II symptoms.  Her mitral regurgitation has worsened, now in the moderate to severe range.  It appears the patient's residual mitral regurgitation originates lateral to the MitraClip device and it is directed anteriorly.  The patient's mean transmitral gradient is 5 mmHg.  This may limit further treatment.  I think it is best to treat her conservatively with a repeat echocardiogram in about 6 months and continuation of her current medical program.  If she develops progressive mitral regurgitation or worsening symptoms, a repeat MitraClip procedure could be considered with implantation of another small clip lateral to the previously implanted clip which remains in appropriate position.  She would require repeat transesophageal echo to determine if this is technically feasible.  Hopefully she will continue to do fairly well with conservative management.           Medication Adjustments/Labs and Tests Ordered: Current medicines are reviewed at length with the patient today.  Concerns regarding medicines are outlined above.  Orders Placed This Encounter  Procedures   ECHOCARDIOGRAM COMPLETE   Meds ordered this encounter  Medications   nitroGLYCERIN (NITRODUR - DOSED IN MG/24 HR) 0.2 mg/hr patch    Sig: 1/2 patch over L Achilles tendon bid    Dispense:  30 patch    Refill:  12   aspirin EC 81 MG tablet    Sig: Take 1 tablet (81 mg total) by mouth daily. Swallow whole.    Dispense:  90 tablet    Refill:  3    Patient Instructions  Medication Instructions:  START Aspirin 81mg  Your physician recommends that you continue on your current medications as directed. Please refer to the Current Medication list given to you today.  *If you need a refill on your  cardiac medications before your next appointment, please call your pharmacy*   Lab Work: NONE If you have labs (blood work) drawn today and your tests are completely normal, you will receive your results only by: [place] (if you have MyChart) OR A paper copy in the mail If you have any lab test that is abnormal or we need to change your treatment, we will call you to review the results.   Testing/Procedures: ECHO (same day appt) Your physician has requested that you have an echocardiogram. Echocardiography is a painless test that uses sound waves to create images of your heart. It provides your doctor with information about the size and shape of your heart and how well your hearts chambers and valves are working. This procedure takes approximately one hour. There are no restrictions for this procedure.    Follow-Up: At [facility], you and your health needs are our priority.  As part of our continuing mission to provide you with exceptional heart care, we have created designated Provider Care Teams.  These Care Teams include your primary Cardiologist (physician) and Advanced Practice Providers (APPs -  Physician Assistants and Nurse Practitioners) who all work together to provide you with the care you need, when you need it.   Your next appointment:   6 month(s)  The format for your next appointment:   In Person  Provider:   Sherren Mocha  Other Instructions Thank you for allowing myself and [facility] to serve you, God Bless!!      Signed, Sherren Mocha, MD  12/01/2021 1:56 PM    [facility]

## 2021-12-02 ENCOUNTER — Ambulatory Visit (INDEPENDENT_AMBULATORY_CARE_PROVIDER_SITE_OTHER): Payer: Medicare Other | Admitting: Gastroenterology

## 2021-12-02 ENCOUNTER — Encounter: Payer: Self-pay | Admitting: Gastroenterology

## 2021-12-02 VITALS — BP 120/68 | HR 53 | Ht 61.0 in | Wt 120.0 lb

## 2021-12-02 DIAGNOSIS — K838 Other specified diseases of biliary tract: Secondary | ICD-10-CM | POA: Diagnosis not present

## 2021-12-02 DIAGNOSIS — R1310 Dysphagia, unspecified: Secondary | ICD-10-CM | POA: Diagnosis not present

## 2021-12-02 DIAGNOSIS — K209 Esophagitis, unspecified without bleeding: Secondary | ICD-10-CM | POA: Diagnosis not present

## 2021-12-02 DIAGNOSIS — Z8619 Personal history of other infectious and parasitic diseases: Secondary | ICD-10-CM | POA: Diagnosis not present

## 2021-12-02 NOTE — Progress Notes (Signed)
Referring Provider: Jeoffrey Massed, MD Primary Care Physician:  Jeoffrey Massed, MD   Chief complaint: Epigastric pain and dysphagia   IMPRESSION and PLAN:  H. pylori IgG positive with gastric biopsies negative for H. pylori: A positive H. pylori IgG does not allow distinction between active infection and colonization.  So unfortunately, a positive test only indicates the presence of IgG antibody to H. pylori and does not necessarily indicate that GI disease is present.  Given her gastric biopsies I do not think additional treatment is indicated at this time.  Reflux esophagitis presenting with dysphagia and epigastric pain: Dysphagia may have been GERD related dysmotility.  Continue pantoprazole BID for at least 2 weeks, then reduce the dose to once every morning. Taper outlined in patient instructions. Reviewed GERD lifestyle modifications.   Intestinal metaplasia seen on gastric biopsies: No high risk factors for gastric cancer.  Chronic dilation of the common bile duct: Likely postsurgical changes.  No associated symptoms.  Liver enzymes are normal.  No additional follow-up indicated at this time.    HPI: Erin [name] is a 87 y.o. female who is seen in follow-up.  This is my first office visit with Erin [name].  I recently met her at the time of her endoscopic evaluation.  She was initially seen by Dr. Barron Alvine 08/04/2021 when abdominal imaging showed a dilated common bile duct.  MRCP confirmed a 19 mm dilated common bile duct with smooth distal tapering and the findings were attributed to postcholecystectomy changes.  Given stability of the dilated common bile duct (15 mm in 2003) and normal liver enzymes, Dr. Barron Alvine did not feel additional imaging or labs were needed.  She was seen 09/22/2021 by Doug Sou for epigastric pain and solid food dysphagia localized to the lower esophagus.  H. pylori serology performed by her PCP was positive she was treated with  tetracycline, Mennonite metronidazole, Pepto-Bismol, and pantoprazole twice daily but unable to complete treatment due to side effects.  EGD 09/25/2021 showed an endoscopically normal esophagus.  Empiric dilation performed 18 mm.  Gastric biopsy showed mild chronic gastritis.  There was no H. pylori.  Gastric biopsy biopsy showed focal intestinal metaplasia.  Esophageal biopsies showed changes of reflux.  The mid and proximal biopsies showed increased intraepithelial lymphocytes and rare eosinophils.  She returns today in scheduled follow-up.  Her symptoms have resolved since the endoscopy. No abdominal pain or further dysphagia. She has questions because prior testing with Dr. Michele Mcalpine showed a positive H. pylori IgG.  No history of alcohol use or cigarette smoking. There has been no occupational exposure to cement, mineral dust, or chrome. There is no known family history of gastric cancer.    Past Medical History:  Diagnosis Date   Achilles tendon rupture    left, no surgery required   Chronic diastolic heart failure ([facility])    Gross hematuria 12/2020   Klebsiella on urine clx x2 but no UTI sx's.  CT showed tiny stones in each renal pelvis but no ureteral or bladder stones, no mass.  Urol did cysto->normal. Plan is annual UA, rpt hematuria w/u in 3-5 yrs if persistent pos.   History of kidney stones    Hypertension    Hypertensive retinopathy of both eyes    Dr. Elmer Picker   Low back pain    spondylosis, listhesis, +scoliosis at L/S jxn   Lower extremity edema    lasix prn   OA (osteoarthritis)    Osteopenia    Osteoporosis 07/2021  07/2021 T score -4.6   S/P mitral valve clip implantation 11/06/2020   s/p TEER with one XTW MitraClip on A2P2 by Dr. Burt Knack.  DAPT w/ASA and plavix x 3 mo post procedure recommended   Severe mitral regurgitation    2021 transthoracic echo and TEE->cardiology following.  Mitral valve clip 10/2020.  f/u echo 10/28/20 mild/mod MVR and tricusp regurg   Severe  pulmonary hypertension ([place]) 2021   transth echo; moderate by TEE 32/7614   Systolic murmur 70/92/9574   severe mitral regurge, mod/severe tricuspic regurg    Past Surgical History:  Procedure Laterality Date   APPENDECTOMY     87 yrs old   [place]  2004   ruptured disc   CHOLECYSTECTOMY     age 66   CYSTOSCOPY  02/24/2021   (for gross hematuria) ->normal.   DEXA  07/2021   2022 T score -4.6 (radius)   LUMBAR LAMINECTOMY  2004   MITRAL VALVE REPAIR N/A 11/06/2020   Procedure: MITRAL VALVE REPAIR;  Surgeon: Sherren Mocha, MD;  Location: [place] [facility];  Service: Cardiovascular;  Laterality: N/A;   RIGHT/LEFT HEART CATH AND CORONARY ANGIOGRAPHY N/A 09/16/2020   No CAD. Procedure: RIGHT/LEFT HEART CATH AND CORONARY ANGIOGRAPHY;  Surgeon: Belva Crome, MD;  Location: [place] [facility];  Service: Cardiovascular;  Laterality: N/A;   TEE WITHOUT CARDIOVERSION N/A 09/16/2020   Procedure: TRANSESOPHAGEAL ECHOCARDIOGRAM (TEE);  Surgeon: Buford Dresser, MD;  [facility];  Service: Cardiovascular;  Laterality: N/A;   TEE WITHOUT CARDIOVERSION N/A 11/06/2020   Procedure: TRANSESOPHAGEAL ECHOCARDIOGRAM (TEE);  Surgeon: Sherren Mocha, MD;  Location: [place] [facility];  Service: Cardiovascular;  Laterality: N/A;   TONSILLECTOMY     age 63   TOTAL KNEE ARTHROPLASTY Left 06/02/2021   Procedure: LEFT TOTAL KNEE ARTHROPLASTY;  Surgeon: Renette Butters, MD;  [facility];  Service: Orthopedics;  Laterality: Left;   TRANSTHORACIC ECHOCARDIOGRAM  07/03/2020; 10/28/20; 12/03/20   06/2020 TTE EF 60-65%, grd II DD, severe pulm art HTN, severe mitral valve regurg, mod/sev tricuspic valve regurg. Conf on TEE 08/2020.  10/28/20 (s/p MV clip procedure) mild-mod MVR, EF 55-60%. 11/2020 EF 60%, mild/mod MR w/mean grad 61mm hg and mod to sev TR.     Current Outpatient Medications  Medication Sig Dispense Refill   amoxicillin (AMOXIL) 500 MG tablet Take 4 tablets (2,000 mg) one  hour prior to all dental visits. 8 tablet 11   Ascorbic Acid (VITAMIN C PO) Take 1,000 mg by mouth daily.     aspirin EC 81 MG tablet Take 1 tablet (81 mg total) by mouth daily. Swallow whole. 90 tablet 3   carvedilol (COREG) 25 MG tablet Take 1 tablet (25 mg total) by mouth daily. 90 tablet 3   cholecalciferol (VITAMIN D) 25 MCG (1000 UNIT) tablet Take 1,000 Units by mouth daily.     furosemide (LASIX) 20 MG tablet Take 1 tablet (20 mg total) by mouth daily as needed for fluid. 36 tablet 0   gabapentin (NEURONTIN) 100 MG capsule 100 mg taken by mouth in the morning and 300 mg taken by mouth in the evening     gabapentin (NEURONTIN) 300 MG capsule Take 300 mg by mouth at bedtime.     ibandronate (BONIVA) 150 MG tablet Take 1 tablet (150 mg total) by mouth every 30 (thirty) days. Take in the morning with a full glass of water, on an empty stomach, and do not take anything else by mouth or lie  down for the next 30 min. 3 tablet 3   Multiple Vitamin (MULTIVITAMIN WITH MINERALS) TABS tablet Take 1 tablet by mouth daily.     Multiple Vitamins-Minerals (PRESERVISION AREDS PO) Take 1 tablet by mouth in the morning and at bedtime.      Multiple Vitamins-Minerals (ZINC PO) Take 50 mg by mouth daily.     nitroGLYCERIN (NITRODUR - DOSED IN MG/24 HR) 0.2 mg/hr patch 1/2 patch over L Achilles tendon bid 30 patch 12   potassium chloride (KLOR-CON) 10 MEQ tablet Take 10 mEq by mouth daily as needed (when taking lasix).     sodium chloride (MURO 128) 5 % ophthalmic solution Place 1 drop into both eyes at bedtime.     traZODone (DESYREL) 50 MG tablet 1-2 tabs po qhs as needed for insomnia 30 tablet 1   vitamin E 180 MG (400 UNITS) capsule Take 400 Units by mouth daily.     No current facility-administered medications for this visit.    Allergies as of 12/02/2021 - Review Complete 12/02/2021  Allergen Reaction Noted   Barbiturates Nausea And Vomiting    Demerol Other (See Comments) 01/12/2011    Hydrochlorothiazide Other (See Comments)    Irbesartan  11/21/2009   Losartan potassium  11/19/2010   Olmesartan medoxomil  07/14/2009   Telmisartan  11/11/2008    Family History  Problem Relation Age of Onset   Hypertension Mother    Early death Father    Cancer Father    Liver cancer Father    Hypertension Other    Colon cancer Neg Hx    Rectal cancer Neg Hx       Physical Exam: Gen: Awake, alert, and oriented, and well communicative. HEENT: EOMI, non-icteric sclera, NCAT, MMM  Neck: Normal movement of head and neck  Pulm: No labored breathing, speaking in full sentences without conversational dyspnea  Derm: No apparent lesions or bruising in visible field  MS: Moves all visible extremities without noticeable abnormality  Psych: Pleasant, cooperative, normal speech, thought processing seemingly intact     I spent 30 minutes, including in depth chart review, independent review of results, communicating results with the patient directly, face-to-face time with the patient, coordinating care, and ordering studies and medications as appropriate, and documentation.    [place] L. Tarri Glenn, MD, MPH 12/02/2021, 8:43 AM

## 2021-12-02 NOTE — Patient Instructions (Addendum)
It was a pleasure to see you today.  I have recommended continuing your pantoprazole twice daily for 2 more weeks. Then, reduce to 40 mg every morning. After two additional weeks, you could stop the medicine all together. If your pain or swallowing returns, please resume the lowest dose that was controlling your symptoms.   We discussed your H pylori test. Although the blood test was positive, your stomach biopsies showed no active infection. This is good news and you do not need any further treatment.   Please let me know if you need anything in the future. Although you can reach our staff by phone, MyChart is a great option for non-urgent concern.

## 2021-12-15 ENCOUNTER — Ambulatory Visit: Payer: Medicare Other | Admitting: Interventional Cardiology

## 2022-01-25 ENCOUNTER — Encounter: Payer: Self-pay | Admitting: Cardiovascular Disease

## 2022-01-25 ENCOUNTER — Ambulatory Visit (INDEPENDENT_AMBULATORY_CARE_PROVIDER_SITE_OTHER): Payer: Medicare Other | Admitting: Cardiovascular Disease

## 2022-01-25 VITALS — BP 142/80 | HR 64 | Ht 61.0 in | Wt 116.1 lb

## 2022-01-25 DIAGNOSIS — I5032 Chronic diastolic (congestive) heart failure: Secondary | ICD-10-CM

## 2022-01-25 DIAGNOSIS — I340 Nonrheumatic mitral (valve) insufficiency: Secondary | ICD-10-CM | POA: Diagnosis not present

## 2022-01-25 NOTE — Patient Instructions (Signed)
Medication Instructions:  ?Your physician recommends that you continue on your current medications as directed. Please refer to the Current Medication list given to you today. ? ?*If you need a refill on your cardiac medications before your next appointment, please call your pharmacy* ? ? ?Lab Work: ?BMET and CBC 7 days prior to TEE (03/05/22) by PCP per pt request ? ?Testing/Procedures: ?Transesophageal Echocardiogram ?Your physician has requested that you have a TEE. During a TEE, sound waves are used to create images of your heart. It provides your doctor with information about the size and shape of your heart and how well your heart?s chambers and valves are working. In this test, a transducer is attached to the end of a flexible tube that?s guided down your throat and into your esophagus (the tube leading from you mouth to your stomach) to get a more detailed image of your heart. You are not awake for the procedure. Please see the instruction sheet given to you today. For further information please visit [email or web address]. ? ?Follow-Up: ?At [facility], you and your health needs are our priority.  As part of our continuing mission to provide you with exceptional heart care, we have created designated Provider Care Teams.  These Care Teams include your primary Cardiologist (physician) and Advanced Practice Providers (APPs -  Physician Assistants and Nurse Practitioners) who all work together to provide you with the care you need, when you need it. ? ?Provider:   ?Sherren Mocha, MD ?  ?INSTRUCTIONS: ?You are scheduled for a TEE on Friday, Feb 26, 2022 with Dr. Kayren Eaves.  Please arrive at the [facility] (Main Entrance A) at [facility]: [address] [place], [place] [address] at 8 am. (1 hour prior to procedure unless lab work is needed; if lab work is needed arrive 1.5 hours ahead) ? ?DIET: Nothing to eat or drink after midnight except a sip of water with medications (see medication instructions  below) ? ?FYI: For your safety, and to allow [place] to monitor your vital signs accurately during the surgery/procedure we request that   ?if you have artificial nails, gel coating, SNS etc. Please have those removed prior to your surgery/procedure. Not having the nail coverings /polish removed may result in cancellation or delay of your surgery/procedure. ? ? ?Medication Instructions: ?Hold Furosemide day of procedure ? ?Labs:  Within 1 week of procedure (at PCP office per pt request) ? ?You must have a responsible person to drive you home and stay in the waiting area during your procedure. Failure to do so could result in cancellation. ? ?Interior and spatial designer cards. ? ?*Special Note: Every effort is made to have your procedure done on time. Occasionally there are emergencies that occur at the hospital that may cause delays. Please be patient if a delay does occur.  ? ?  ?

## 2022-01-25 NOTE — Progress Notes (Signed)
?Cardiology Office Note:   ? ?Date:  01/25/2022  ? ?ID:  Erin [name], DOB 19-Oct-1935, MRN [id number] ? ?PCP:  Tammi Sou, MD ?  ?[facility] Providers ?Cardiologist:  Sinclair Grooms, MD    ? ?Referring MD: Tammi Sou, MD  ? ?Chief Complaint  ?Patient presents with  ? Shortness of Breath  ? ? ?History of Present Illness:   ? ?Erin [name] is a 87 y.o. female with a hx of severe primary mitral regurgitation status post transcatheter edge-to-edge mitral valve repair with MitraClip November 06, 2020.  The patient initially had a good result with reduction in her mitral regurgitation to the mild to moderate range (1-2+).  She did well clinically for several months but was found to have moderate to severe mitral regurgitation on follow-up echo assessment. ? ?She was last seen in February 2023 at which time she was clinically stable.  However, over the last 3 to 4 weeks she has developed progressive shortness of breath with activity.  She complains of New York Heart Association functional class III limitation of shortness of breath with exertion.  She also has episodes of orthopnea and PND.  No leg swelling.  She reports occasional chest pressure symptoms. ? ?Past Medical History:  ?Diagnosis Date  ? Achilles tendon rupture   ? left, no surgery required  ? Chronic diastolic heart failure ([place])   ? Gross hematuria 12/2020  ? Klebsiella on urine clx x2 but no UTI sx's.  CT showed tiny stones in each renal pelvis but no ureteral or bladder stones, no mass.  Urol did cysto->normal. Plan is annual UA, rpt hematuria w/u in 3-5 yrs if persistent pos.  ? History of kidney stones   ? Hypertension   ? Hypertensive retinopathy of both eyes   ? Dr. Herbert Deaner  ? Low back pain   ? spondylosis, listhesis, +scoliosis at L/S jxn  ? Lower extremity edema   ? lasix prn  ? OA (osteoarthritis)   ? Osteopenia   ? Osteoporosis 07/2021  ? 07/2021 T score -4.6  ? S/P mitral valve clip implantation 11/06/2020  ? s/p TEER with one  XTW MitraClip on A2P2 by Dr. Burt Knack.  DAPT w/ASA and plavix x 3 mo post procedure recommended  ? Severe mitral regurgitation   ? 2021 transthoracic echo and TEE->cardiology following.  Mitral valve clip 10/2020.  f/u echo 10/28/20 mild/mod MVR and tricusp regurg  ? Severe pulmonary hypertension ([place]) 2021  ? transth echo; moderate by TEE 08/2020  ? Systolic murmur 66/44/0347  ? severe mitral regurge, mod/severe tricuspic regurg  ? ? ?Past Surgical History:  ?Procedure Laterality Date  ? APPENDECTOMY    ? 87 yrs old  ? BACK SURGERY  2004  ? ruptured disc  ? CHOLECYSTECTOMY    ? age 33  ? CYSTOSCOPY  02/24/2021  ? (for gross hematuria) ->normal.  ? DEXA  07/2021  ? 2022 T score -4.6 (radius)  ? LUMBAR LAMINECTOMY  2004  ? MITRAL VALVE REPAIR N/A 11/06/2020  ? Procedure: MITRAL VALVE REPAIR;  Surgeon: Sherren Mocha, MD;  Location: [facility];  Service: Cardiovascular;  Laterality: N/A;  ? RIGHT/LEFT HEART CATH AND CORONARY ANGIOGRAPHY N/A 09/16/2020  ? No CAD. Procedure: RIGHT/LEFT HEART CATH AND CORONARY ANGIOGRAPHY;  Surgeon: Belva Crome, MD;  Location: [facility];  Service: Cardiovascular;  Laterality: N/A;  ? TEE WITHOUT CARDIOVERSION N/A 09/16/2020  ? Procedure: TRANSESOPHAGEAL ECHOCARDIOGRAM (TEE);  Surgeon: Buford Dresser,  MD;  Location: [facility];  Service: Cardiovascular;  Laterality: N/A;  ? TEE WITHOUT CARDIOVERSION N/A 11/06/2020  ? Procedure: TRANSESOPHAGEAL ECHOCARDIOGRAM (TEE);  Surgeon: Sherren Mocha, MD;  Location: [facility];  Service: Cardiovascular;  Laterality: N/A;  ? TONSILLECTOMY    ? age 51  ? TOTAL KNEE ARTHROPLASTY Left 06/02/2021  ? Procedure: LEFT TOTAL KNEE ARTHROPLASTY;  Surgeon: Renette Butters, MD;  Location: [facility];  Service: Orthopedics;  Laterality: Left;  ? TRANSTHORACIC ECHOCARDIOGRAM  07/03/2020; 10/28/20; 12/03/20  ? 06/2020 TTE EF 60-65%, grd II DD, severe pulm art HTN, severe mitral valve regurg, mod/sev tricuspic valve regurg. Conf on TEE  08/2020.  10/28/20 (s/p MV clip procedure) mild-mod MVR, EF 55-60%. 11/2020 EF 60%, mild/mod MR w/mean grad 86m hg and mod to sev TR.  ? ? ?Current Medications: ?Current Meds  ?Medication Sig  ? amoxicillin (AMOXIL) 500 MG tablet Take 4 tablets (2,000 mg) one hour prior to all dental visits.  ? Ascorbic Acid (VITAMIN C PO) Take 1,000 mg by mouth daily.  ? aspirin EC 81 MG tablet Take 1 tablet (81 mg total) by mouth daily. Swallow whole.  ? carvedilol (COREG) 25 MG tablet Take 1 tablet (25 mg total) by mouth daily.  ? cholecalciferol (VITAMIN D) 25 MCG (1000 UNIT) tablet Take 1,000 Units by mouth daily.  ? furosemide (LASIX) 20 MG tablet Take 1 tablet (20 mg total) by mouth daily as needed for fluid.  ? Multiple Vitamin (MULTIVITAMIN WITH MINERALS) TABS tablet Take 1 tablet by mouth daily.  ? Multiple Vitamins-Minerals (PRESERVISION AREDS PO) Take 1 tablet by mouth in the morning and at bedtime.   ? Multiple Vitamins-Minerals (ZINC PO) Take 50 mg by mouth daily.  ? nitroGLYCERIN (NITRODUR - DOSED IN MG/24 HR) 0.2 mg/hr patch 1/2 patch over L Achilles tendon bid  ? omeprazole (PRILOSEC) 20 MG capsule Take 20 mg by mouth daily.  ? potassium chloride (KLOR-CON) 10 MEQ tablet Take 10 mEq by mouth daily as needed (when taking lasix).  ? sodium chloride (MURO 128) 5 % ophthalmic solution Place 1 drop into both eyes at bedtime.  ? vitamin E 180 MG (400 UNITS) capsule Take 400 Units by mouth daily.  ?  ? ?Allergies:   Barbiturates, Demerol, Hydrochlorothiazide, Irbesartan, Losartan potassium, Olmesartan medoxomil, and Telmisartan  ? ?Social History  ? ?Socioeconomic History  ? Marital status: Married  ?  Spouse name: Not on file  ? Number of children: Not on file  ? Years of education: Not on file  ? Highest education level: Not on file  ?Occupational History  ? Not on file  ?Tobacco Use  ? Smoking status: Never  ? Smokeless tobacco: Never  ?Vaping Use  ? Vaping Use: Never used  ?Substance and Sexual Activity  ? Alcohol use: No   ? Drug use: Never  ? Sexual activity: Not Currently  ?Other Topics Concern  ? Not on file  ?Social History Narrative  ? Married, 6 children, about 15 GC.  8 GGC  ? 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 No tob.  ? No alc.  ? ?Social Determinants of Health  ? ?Financial Resource Strain: Low Risk   ? Difficulty of Paying Living Expenses: Not hard at all  ?Food Insecurity: No Food Insecurity  ? Worried About RCharity fundraiserin the Last Year: Never true  ? Ran Out of Food  in the Last Year: Never true  ?Transportation Needs: No Transportation Needs  ? Lack of Transportation (Medical): No  ? Lack of Transportation (Non-Medical): No  ?Physical Activity: Sufficiently Active  ? Days of Exercise per Week: 7 days  ? Minutes of Exercise per Session: 30 min  ?Stress: No Stress Concern Present  ? Feeling of Stress : Not at all  ?Social Connections: Socially Integrated  ? Frequency of Communication with Friends and Family: More than three times a week  ? Frequency of Social Gatherings with Friends and Family: More than three times a week  ? Attends Religious Services: More than 4 times per year  ? Active Member of Clubs or Organizations: Yes  ? Attends Archivist Meetings: More than 4 times per year  ? Marital Status: Married  ?  ? ?Family History: ?The patient's family history includes Cancer in her father; Early death in her father; Hypertension in her mother and another family member; Liver cancer in her father. There is no history of Colon cancer or Rectal cancer. ? ?ROS:   ?Please see the history of present illness.    ?All other systems reviewed and are negative. ? ?EKGs/Labs/Other Studies Reviewed:   ? ?The following studies were reviewed today: ?Cardiac Catheterization: ?Normal coronary arteries with right dominant anatomy. ?Mild pulmonary hypertension with mean pulmonary artery pressure 29 mmHg.  Pulmonary capillary wedge mean 17 mmHg and left  ventricular end-diastolic pressure 17 mmHg.  Pulmonary vascular resistance 2.39 Woods units.  WHO group 2 etiology. ?Severe mitral regurgitation documented by TEE and confirmed by hemodynamic recordings

## 2022-01-26 LAB — CBC
Hematocrit: 41.3 % (ref 34.0–46.6)
Hemoglobin: 13.3 g/dL (ref 11.1–15.9)
MCH: 27.9 pg (ref 26.6–33.0)
MCHC: 32.2 g/dL (ref 31.5–35.7)
MCV: 87 fL (ref 79–97)
Platelets: 256 10*3/uL (ref 150–450)
RBC: 4.76 x10E6/uL (ref 3.77–5.28)
RDW: 14.7 % (ref 11.7–15.4)
WBC: 6.9 10*3/uL (ref 3.4–10.8)

## 2022-01-26 LAB — BASIC METABOLIC PANEL
BUN/Creatinine Ratio: 36 — ABNORMAL HIGH (ref 12–28)
BUN: 20 mg/dL (ref 8–27)
CO2: 27 mmol/L (ref 20–29)
Calcium: 9.8 mg/dL (ref 8.7–10.3)
Chloride: 105 mmol/L (ref 96–106)
Creatinine, Ser: 0.56 mg/dL — ABNORMAL LOW (ref 0.57–1.00)
Glucose: 91 mg/dL (ref 70–99)
Potassium: 4.7 mmol/L (ref 3.5–5.2)
Sodium: 146 mmol/L — ABNORMAL HIGH (ref 134–144)
eGFR: 90 mL/min/{1.73_m2} (ref >59)

## 2022-02-23 ENCOUNTER — Telehealth: Payer: Self-pay | Admitting: Cardiovascular Disease

## 2022-02-23 NOTE — Telephone Encounter (Signed)
Called and spoke to patient who states that she wants to cancel her TEE scheduled for 03/05/22. Pt states that her SOB "is better, just fine, no issues" and that she just has so much going on that she will address this later. I asked if she would like to reschedule procedure rather than cancel, and she states "I'm not interested right now, but I can call back later on if I want to do it." Will route to [employer] as Juluis Rainier. Will call to cancel procedure tomorrow (02/24/22) ?

## 2022-02-23 NOTE — Telephone Encounter (Signed)
Patient is called because she would like to cancel her TEE appointment.  ?

## 2022-02-24 NOTE — Telephone Encounter (Signed)
Ok. Thanks for letting me know.

## 2022-02-26 ENCOUNTER — Ambulatory Visit: Payer: Medicare Other | Admitting: Family Medicine

## 2022-03-05 ENCOUNTER — Ambulatory Visit (HOSPITAL_COMMUNITY): Admit: 2022-03-05 | Payer: Medicare Other | Admitting: Internal Medicine

## 2022-03-05 ENCOUNTER — Encounter (HOSPITAL_COMMUNITY): Payer: Self-pay

## 2022-03-05 SURGERY — ECHOCARDIOGRAM, TRANSESOPHAGEAL
Anesthesia: Moderate Sedation

## 2022-03-22 ENCOUNTER — Emergency Department (INDEPENDENT_AMBULATORY_CARE_PROVIDER_SITE_OTHER)
Admission: RE | Admit: 2022-03-22 | Discharge: 2022-03-22 | Disposition: A | Discharge status: Home / Self Care | Payer: Medicare Other | Source: Ambulatory Visit

## 2022-03-22 ENCOUNTER — Other Ambulatory Visit: Payer: Self-pay

## 2022-03-22 VITALS — BP 103/63 | HR 58 | Temp 97.7°F | Resp 16 | Ht 63.0 in | Wt 115.0 lb

## 2022-03-22 DIAGNOSIS — J0100 Acute maxillary sinusitis, unspecified: Secondary | ICD-10-CM

## 2022-03-22 DIAGNOSIS — R059 Cough, unspecified: Secondary | ICD-10-CM

## 2022-03-22 DIAGNOSIS — R6883 Chills (without fever): Secondary | ICD-10-CM

## 2022-03-22 MED ORDER — BENZONATATE 200 MG PO CAPS: 200.0000 mg | ORAL_CAPSULE | Freq: Three times a day (TID) | ORAL | 0 refills | Status: AC | PRN

## 2022-03-22 MED ORDER — DOXYCYCLINE HYCLATE 100 MG PO CAPS: 100.0000 mg | ORAL_CAPSULE | Freq: Two times a day (BID) | ORAL | 0 refills | Status: AC

## 2022-03-22 NOTE — ED Triage Notes (Signed)
Nasal congestion, chills yellow, mucus had Covid a week ago.

## 2022-03-22 NOTE — Discharge Instructions (Addendum)
Instructed patient to take medication as directed with food to completion.  Advised may take Tessalon Perles daily or as needed for cough.  Encouraged patient to increase daily water intake while taking these medications.  Advised patient if symptoms worsen and/or unresolved please follow-up with PCP or here for further evaluation.

## 2022-03-22 NOTE — ED Provider Notes (Signed)
[name]    CSN: [id number] Arrival date & time: 03/22/22  1036      History   Chief Complaint Chief Complaint  Patient presents with   Chills    Post Covid, chest congestion, chills - Entered by patient   Nasal Congestion    HPI Erin [name] is a 87 y.o. female.   HPI Very pleasant 87 year old female presents with chest congestion, 6 chills and nasal congestion for 1 week.  Patient reports having COVID-19 3 week ago.  She reports that she tested positive on home COVID-19 test and was not treated with antiviral.  PMH significant for chronic diastolic heart failure, severe pulmonary hypertension, and severe mitral valve regurgitation.  Past Medical History:  Diagnosis Date   Achilles tendon rupture    left, no surgery required   Chronic diastolic heart failure ([place])    Gross hematuria 12/2020   Klebsiella on urine clx x2 but no UTI sx's.  CT showed tiny stones in each renal pelvis but no ureteral or bladder stones, no mass.  Urol did cysto->normal. Plan is annual UA, rpt hematuria w/u in 3-5 yrs if persistent pos.   History of kidney stones    Hypertension    Hypertensive retinopathy of both eyes    Dr. Herbert Deaner   Low back pain    spondylosis, listhesis, +scoliosis at L/S jxn   Lower extremity edema    lasix prn   OA (osteoarthritis)    Osteopenia    Osteoporosis 07/2021   07/2021 T score -4.6   S/P mitral valve clip implantation 11/06/2020   s/p TEER with one XTW MitraClip on A2P2 by Dr. Burt Knack.  DAPT w/ASA and plavix x 3 mo post procedure recommended   Severe mitral regurgitation    2021 transthoracic echo and TEE->cardiology following.  Mitral valve clip 10/2020.  f/u echo 10/28/20 mild/mod MVR and tricusp regurg   Severe pulmonary hypertension ([place]) 2021   transth echo; moderate by TEE 93/2671   Systolic murmur 24/58/0998   severe mitral regurge, mod/severe tricuspic regurg    Patient Active Problem List   Diagnosis Date Noted   Dysphagia  33/82/5053   History of Helicobacter pylori infection 09/22/2021   Gastroesophageal reflux disease 09/22/2021   Non-rheumatic mitral regurgitation 11/06/2020   S/P mitral valve clip implantation 11/06/2020   Hypertension    Severe pulmonary hypertension ([place]) 2021   Pain in left foot 08/27/2019   Osteoarthritis of right knee 05/08/2018   Osteoarthritis of left knee 05/08/2018   Fatigue 09/22/2016   History of CVA (cerebrovascular accident) 09/19/2012   Hematuria, gross 10/27/2011   Obesity 09/09/2011   Cystitis 05/21/2010   HEARING IMPAIRMENT 01/25/2008   Essential hypertension 10/21/2007   Degenerative scoliosis 10/21/2007   OSTEOPENIA 10/21/2007   Gout 10/20/2007    Past Surgical History:  Procedure Laterality Date   APPENDECTOMY     87 yrs old   [place]  2004   ruptured disc   CHOLECYSTECTOMY     age 74   CYSTOSCOPY  02/24/2021   (for gross hematuria) ->normal.   DEXA  07/2021   2022 T score -4.6 (radius)   LUMBAR LAMINECTOMY  2004   MITRAL VALVE REPAIR N/A 11/06/2020   Procedure: MITRAL VALVE REPAIR;  Surgeon: Sherren Mocha, MD;  Location: [facility];  Service: Cardiovascular;  Laterality: N/A;   RIGHT/LEFT HEART CATH AND CORONARY ANGIOGRAPHY N/A 09/16/2020   No CAD. Procedure: RIGHT/LEFT HEART CATH AND CORONARY ANGIOGRAPHY;  Surgeon: Tamala Julian,  Lynnell Dike, MD;  Location: [facility];  Service: Cardiovascular;  Laterality: N/A;   TEE WITHOUT CARDIOVERSION N/A 09/16/2020   Procedure: TRANSESOPHAGEAL ECHOCARDIOGRAM (TEE);  Surgeon: Buford Dresser, MD;  Location: [facility];  Service: Cardiovascular;  Laterality: N/A;   TEE WITHOUT CARDIOVERSION N/A 11/06/2020   Procedure: TRANSESOPHAGEAL ECHOCARDIOGRAM (TEE);  Surgeon: Sherren Mocha, MD;  Location: [facility];  Service: Cardiovascular;  Laterality: N/A;   TONSILLECTOMY     age 13   TOTAL KNEE ARTHROPLASTY Left 06/02/2021   Procedure: LEFT TOTAL KNEE ARTHROPLASTY;  Surgeon: Renette Butters, MD;  Location: [facility];  Service: Orthopedics;  Laterality: Left;   TRANSTHORACIC ECHOCARDIOGRAM  07/03/2020; 10/28/20; 12/03/20   06/2020 TTE EF 60-65%, grd II DD, severe pulm art HTN, severe mitral valve regurg, mod/sev tricuspic valve regurg. Conf on TEE 08/2020.  10/28/20 (s/p MV clip procedure) mild-mod MVR, EF 55-60%. 11/2020 EF 60%, mild/mod MR w/mean grad 72m hg and mod to sev TR.    OB History   No obstetric history on file.      Home Medications    Prior to Admission medications   Medication Sig Start Date End Date Taking? Authorizing Provider  benzonatate (TESSALON) 200 MG capsule Take 1 capsule (200 mg total) by mouth 3 (three) times daily as needed for up to 7 days for cough. 03/22/22 03/29/22 Yes REliezer Lofts FNP  doxycycline (VIBRAMYCIN) 100 MG capsule Take 1 capsule (100 mg total) by mouth 2 (two) times daily for 10 days. 03/22/22 04/01/22 Yes REliezer Lofts FNP  amoxicillin (AMOXIL) 500 MG tablet Take 4 tablets (2,000 mg) one hour prior to all dental visits. 12/04/20   TEileen Stanford PA-C  Ascorbic Acid (VITAMIN C PO) Take 1,000 mg by mouth daily.    [provider]  aspirin EC 81 MG tablet Take 1 tablet (81 mg total) by mouth daily. Swallow whole. 11/25/21   CSherren Mocha MD  carvedilol (COREG) 25 MG tablet Take 1 tablet (25 mg total) by mouth daily. 07/02/20   McGowen, PAdrian Blackwater MD  cholecalciferol (VITAMIN D) 25 MCG (1000 UNIT) tablet Take 1,000 Units by mouth daily.    [provider]  furosemide (LASIX) 20 MG tablet Take 1 tablet (20 mg total) by mouth daily as needed for fluid. 10/12/21   BThornton Park MD  gabapentin (NEURONTIN) 100 MG capsule 100 mg taken by mouth in the morning and 300 mg taken by mouth in the evening Patient not taking: Reported on 01/25/2022 10/12/21   BThornton Park MD  gabapentin (NEURONTIN) 300 MG capsule Take 300 mg by mouth at bedtime. Patient not taking: Reported on 01/25/2022 11/12/21   [provider]   ibandronate (BONIVA) 150 MG tablet Take 1 tablet (150 mg total) by mouth every 30 (thirty) days. Take in the morning with a full glass of water, on an empty stomach, and do not take anything else by mouth or lie down for the next 30 min. Patient not taking: Reported on 01/25/2022 08/11/21   MTammi Sou MD  Multiple Vitamin (MULTIVITAMIN WITH MINERALS) TABS tablet Take 1 tablet by mouth daily.    [provider]  Multiple Vitamins-Minerals (PRESERVISION AREDS PO) Take 1 tablet by mouth in the morning and at bedtime.     [provider]  Multiple Vitamins-Minerals (ZINC PO) Take 50 mg by mouth daily.    [provider]  nitroGLYCERIN (NITRODUR - DOSED IN MG/24 HR) 0.2 mg/hr patch 1/2 patch over L Achilles  tendon bid 11/25/21   Sherren Mocha, MD  omeprazole (PRILOSEC) 20 MG capsule Take 20 mg by mouth daily. 12/02/21   [provider]  pantoprazole (PROTONIX) 40 MG tablet Take 40 mg by mouth 2 (two) times daily. Patient not taking: Reported on 01/25/2022    [provider]  potassium chloride (KLOR-CON) 10 MEQ tablet Take 10 mEq by mouth daily as needed (when taking lasix).    [provider]  sodium chloride (MURO 128) 5 % ophthalmic solution Place 1 drop into both eyes at bedtime.    [provider]  traZODone (DESYREL) 50 MG tablet 1-2 tabs po qhs as needed for insomnia Patient not taking: Reported on 01/25/2022 08/11/21   Tammi Sou, MD  vitamin E 180 MG (400 UNITS) capsule Take 400 Units by mouth daily.    [provider]    Family History Family History  Problem Relation Age of Onset   Hypertension Mother    Early death Father    Cancer Father    Liver cancer Father    Hypertension Other    Colon cancer Neg Hx    Rectal cancer Neg Hx     Social History Social History   Tobacco Use   Smoking status: Never   Smokeless tobacco: Never  Vaping Use   Vaping Use: Never used  Substance Use Topics   Alcohol  use: No   Drug use: Never     Allergies   Barbiturates, Demerol, Hydrochlorothiazide, Irbesartan, Losartan potassium, Olmesartan medoxomil, and Telmisartan   Review of Systems Review of Systems  HENT:  Positive for congestion and postnasal drip.   Respiratory:         Chest congestion x1 week  All other systems reviewed and are negative.   Physical Exam Triage Vital Signs ED Triage Vitals  Enc Vitals Group     BP 03/22/22 1059 103/63     Pulse Rate 03/22/22 1059 (!) 58     Resp 03/22/22 1059 16     Temp 03/22/22 1059 97.7 F (36.5 C)     Temp Source 03/22/22 1059 Oral     SpO2 03/22/22 1059 99 %     Weight 03/22/22 1100 115 lb (52.2 kg)     Height 03/22/22 1100 '5\' 3"'$  (1.6 m)     Head Circumference --      Peak Flow --      Pain Score 03/22/22 1100 0     Pain Loc --      Pain Edu? --      Excl. in [place]? --    No data found.  Updated Vital Signs BP 103/63 (BP Location: Left Arm)   Pulse (!) 58   Temp 97.7 F (36.5 C) (Oral)   Resp 16   Ht '5\' 3"'$  (1.6 m)   Wt 115 lb (52.2 kg)   SpO2 99%   BMI 20.37 kg/m    Physical Exam Vitals and nursing note reviewed.  Constitutional:      General: She is not in acute distress.    Appearance: Normal appearance. She is normal weight. She is not ill-appearing.  HENT:     Head: Normocephalic and atraumatic.     Right Ear: Tympanic membrane and external ear normal.     Left Ear: Tympanic membrane and external ear normal.     Ears:     Comments: Mild eustachian tube dysfunction noted bilaterally    Nose: Nose normal.     Mouth/Throat:  Mouth: Mucous membranes are moist.     Pharynx: Oropharynx is clear.  Eyes:     Extraocular Movements: Extraocular movements intact.     Conjunctiva/sclera: Conjunctivae normal.     Pupils: Pupils are equal, round, and reactive to light.  Cardiovascular:     Rate and Rhythm: Normal rate and regular rhythm.     Pulses: Normal pulses.     Heart sounds: Normal heart sounds.  Pulmonary:      Effort: Pulmonary effort is normal.     Breath sounds: Normal breath sounds. No wheezing, rhonchi or rales.  Chest:     Chest wall: No tenderness.  Musculoskeletal:     Cervical back: Normal range of motion and neck supple.  Skin:    General: Skin is warm and dry.  Neurological:     General: No focal deficit present.     Mental Status: She is alert and oriented to person, place, and time.     UC Treatments / Results  Labs (all labs ordered are listed, but only abnormal results are displayed) Labs Reviewed - No data to display  EKG   Radiology No results found.  Procedures Procedures (including critical care time)  Medications Ordered in UC Medications - No data to display  Initial Impression / Assessment and Plan / UC Course  I have reviewed the triage vital signs and the nursing notes.  Pertinent labs & imaging results that were available during my care of the patient were reviewed by me and considered in my medical decision making (see chart for details).     MDM: 1.  Subacute maxillary sinusitis-Rx'd Doxycycline; 2.  Cough-Rx'd Tessalon Perles. Instructed patient to take medication as directed with food to completion.  Advised may take Tessalon Perles daily or as needed for cough.  Encouraged patient to increase daily water intake while taking these medications.  Advised patient if symptoms worsen and/or unresolved please follow-up with PCP or here for further evaluation.  Patient discharged home, hemodynamically stable. Final Clinical Impressions(s) / UC Diagnoses   Final diagnoses:  Subacute maxillary sinusitis  Cough, unspecified type     Discharge Instructions      Instructed patient to take medication as directed with food to completion.  Advised may take Tessalon Perles daily or as needed for cough.  Encouraged patient to increase daily water intake while taking these medications.  Advised patient if symptoms worsen and/or unresolved please follow-up  with PCP or here for further evaluation.     ED Prescriptions     Medication Sig Dispense Auth. Provider   doxycycline (VIBRAMYCIN) 100 MG capsule Take 1 capsule (100 mg total) by mouth 2 (two) times daily for 10 days. 20 capsule Eliezer Lofts, FNP   benzonatate (TESSALON) 200 MG capsule Take 1 capsule (200 mg total) by mouth 3 (three) times daily as needed for up to 7 days for cough. 40 capsule Eliezer Lofts, FNP      PDMP not reviewed this encounter.   Eliezer Lofts, [place] 03/22/22 1134

## 2022-03-24 ENCOUNTER — Telehealth: Payer: Self-pay | Admitting: Emergency Medicine

## 2022-03-24 MED ORDER — AMOXICILLIN 500 MG PO CAPS: ORAL_CAPSULE | ORAL | 0 refills | Status: DC

## 2022-03-24 NOTE — Telephone Encounter (Signed)
Pt called to request a different antibiotic - current one is making her "sick to her stomach" Dr Assunta Found to review & will send in a different prescription

## 2022-03-24 NOTE — Telephone Encounter (Signed)
Patient cannot tolerate doxycycline.  Will switch to amoxicillin '500mg'$  TID

## 2022-04-05 ENCOUNTER — Ambulatory Visit (INDEPENDENT_AMBULATORY_CARE_PROVIDER_SITE_OTHER): Payer: Medicare Other | Admitting: Nurse Practitioner

## 2022-04-05 ENCOUNTER — Encounter: Payer: Self-pay | Admitting: Nurse Practitioner

## 2022-04-05 VITALS — BP 140/80 | HR 73 | Ht 63.0 in | Wt 117.8 lb

## 2022-04-05 DIAGNOSIS — I5032 Chronic diastolic (congestive) heart failure: Secondary | ICD-10-CM | POA: Diagnosis not present

## 2022-04-05 DIAGNOSIS — I340 Nonrheumatic mitral (valve) insufficiency: Secondary | ICD-10-CM | POA: Diagnosis not present

## 2022-04-05 DIAGNOSIS — Z95818 Presence of other cardiac implants and grafts: Secondary | ICD-10-CM

## 2022-04-05 DIAGNOSIS — I10 Essential (primary) hypertension: Secondary | ICD-10-CM | POA: Diagnosis not present

## 2022-04-05 DIAGNOSIS — Z9889 Other specified postprocedural states: Secondary | ICD-10-CM | POA: Diagnosis not present

## 2022-04-05 DIAGNOSIS — R251 Tremor, unspecified: Secondary | ICD-10-CM

## 2022-04-05 DIAGNOSIS — I272 Pulmonary hypertension, unspecified: Secondary | ICD-10-CM | POA: Diagnosis not present

## 2022-04-05 MED ORDER — FUROSEMIDE 20 MG PO TABS
20.0000 mg | ORAL_TABLET | Freq: Every day | ORAL | 3 refills | Status: DC
Start: 2022-04-05 — End: 2023-03-03

## 2022-04-05 NOTE — Patient Instructions (Addendum)
Medication Instructions:  Lasix 20 mg  Potassium 10 meq daily  *If you need a refill on your cardiac medications before your next appointment, please call your pharmacy*   Lab Work: NONE ordered at this time of appointment   If you have labs (blood work) drawn today and your tests are completely normal, you will receive your results only by: [place] (if you have MyChart) OR A paper copy in the mail If you have any lab test that is abnormal or we need to change your treatment, we will call you to review the results.   Testing/Procedures: NONE ordered at this time of appointment     Follow-Up: At [facility], you and your health needs are our priority.  As part of our continuing mission to provide you with exceptional heart care, we have created designated Provider Care Teams.  These Care Teams include your primary Cardiologist (physician) and Advanced Practice Providers (APPs -  Physician Assistants and Nurse Practitioners) who all work together to provide you with the care you need, when you need it.  We recommend signing up for the patient portal called "MyChart".  Sign up information is provided on this After Visit Summary.  MyChart is used to connect with patients for Virtual Visits (Telemedicine).  Patients are able to view lab/test results, encounter notes, upcoming appointments, etc.  Non-urgent messages can be sent to your provider as well.   To learn more about what you can do with MyChart, go to [email or web address].    Your next appointment:    Next available (Dr. Burt Knack)  3-4 month(s)  The format for your next appointment:   In Person  Provider:   Sinclair Grooms, MD     Other Instructions Monitor Blood pressure once daily. Report BP consistently greater than 140/80. Complete BMET with your Primary Care doctor  Important Information About Sugar

## 2022-04-05 NOTE — Progress Notes (Addendum)
Office Visit    Patient Name: Erin [name] Date of Encounter: 04/05/2022  Primary Care Provider:  Tammi Sou, MD Primary Cardiologist:  Sinclair Grooms, MD  Chief Complaint    87 year old female with a history of severe mitral valve regurgitation s/p mitral valve clip implantation, chronic diastolic heart failure, severe pulmonary hypertension, hypertension, and osteoarthritis who presents for follow-up related to mitral valve regurgitation.  Past Medical History    Past Medical History:  Diagnosis Date   Achilles tendon rupture    left, no surgery required   Chronic diastolic heart failure ([place])    Gross hematuria 12/2020   Klebsiella on urine clx x2 but no UTI sx's.  CT showed tiny stones in each renal pelvis but no ureteral or bladder stones, no mass.  Urol did cysto->normal. Plan is annual UA, rpt hematuria w/u in 3-5 yrs if persistent pos.   History of kidney stones    Hypertension    Hypertensive retinopathy of both eyes    Dr. Herbert Deaner   Low back pain    spondylosis, listhesis, +scoliosis at L/S jxn   Lower extremity edema    lasix prn   OA (osteoarthritis)    Osteopenia    Osteoporosis 07/2021   07/2021 T score -4.6   S/P mitral valve clip implantation 11/06/2020   s/p TEER with one XTW MitraClip on A2P2 by Dr. Burt Knack.  DAPT w/ASA and plavix x 3 mo post procedure recommended   Severe mitral regurgitation    2021 transthoracic echo and TEE->cardiology following.  Mitral valve clip 10/2020.  f/u echo 10/28/20 mild/mod MVR and tricusp regurg   Severe pulmonary hypertension ([place]) 2021   transth echo; moderate by TEE 85/2778   Systolic murmur 24/23/5361   severe mitral regurge, mod/severe tricuspic regurg   Past Surgical History:  Procedure Laterality Date   APPENDECTOMY     87 yrs old   [place]  2004   ruptured disc   CHOLECYSTECTOMY     age 57   CYSTOSCOPY  02/24/2021   (for gross hematuria) ->normal.   DEXA  07/2021   2022 T score -4.6  (radius)   LUMBAR LAMINECTOMY  2004   MITRAL VALVE REPAIR N/A 11/06/2020   Procedure: MITRAL VALVE REPAIR;  Surgeon: Sherren Mocha, MD;  Location: [place] [facility];  Service: Cardiovascular;  Laterality: N/A;   RIGHT/LEFT HEART CATH AND CORONARY ANGIOGRAPHY N/A 09/16/2020   No CAD. Procedure: RIGHT/LEFT HEART CATH AND CORONARY ANGIOGRAPHY;  Surgeon: Belva Crome, MD;  Location: [place] [facility];  Service: Cardiovascular;  Laterality: N/A;   TEE WITHOUT CARDIOVERSION N/A 09/16/2020   Procedure: TRANSESOPHAGEAL ECHOCARDIOGRAM (TEE);  Surgeon: Buford Dresser, MD;  Location: [facility];  Service: Cardiovascular;  Laterality: N/A;   TEE WITHOUT CARDIOVERSION N/A 11/06/2020   Procedure: TRANSESOPHAGEAL ECHOCARDIOGRAM (TEE);  Surgeon: Sherren Mocha, MD;  Location: [place] [facility];  Service: Cardiovascular;  Laterality: N/A;   TONSILLECTOMY     age 67   TOTAL KNEE ARTHROPLASTY Left 06/02/2021   Procedure: LEFT TOTAL KNEE ARTHROPLASTY;  Surgeon: Renette Butters, MD;  Location: [facility];  Service: Orthopedics;  Laterality: Left;   TRANSTHORACIC ECHOCARDIOGRAM  07/03/2020; 10/28/20; 12/03/20   06/2020 TTE EF 60-65%, grd II DD, severe pulm art HTN, severe mitral valve regurg, mod/sev tricuspic valve regurg. Conf on TEE 08/2020.  10/28/20 (s/p MV clip procedure) mild-mod MVR, EF 55-60%. 11/2020 EF 60%, mild/mod MR w/mean grad 75m hg and mod to sev  TR.    Allergies  Allergies  Allergen Reactions   Barbiturates Nausea And Vomiting   Demerol Other (See Comments)    sick   Hydrochlorothiazide Other (See Comments)    unknown   Irbesartan     HA   Losartan Potassium     hair loss   Olmesartan Medoxomil     hair loss   Telmisartan     cramps    History of Present Illness    87 year old female with the above past medical history including severe mitral valve regurgitation s/p mitral valve clip implantation, chronic diastolic heart failure, severe pulmonary hypertension, hypertension,  and osteoarthritis.  Cardiac catheterization in November 2021 showed normal coronary arteries.  She has a history of severe primary mitral valve regurgitation s/p transcatheter edge-to-edge mitral valve repair with MitraClip in January 2022.  She initially had a good result with reduction in mitral valve regurgitation mild to moderate range.  She did well clinically for several months but was found to have severe mitral regurgitation on follow-up echocardiogram.  Most recent echocardiogram in January 2023 showed EF 60 to 65%, no RWMA, severely dilated left atrium, mildly dilated right atrium, moderate to 0 mitral valve regurgitation, mean gradient 5 mmHg, small ASD.  She was last seen in the office on 01/25/2022 reported an increase in shortness of breath with activity, orthopnea, and PND as well as occasional chest pressure.  TEE was recommended to better assess functional anatomy of mitral valve, feasibility of repeat transcatheter edge-to-edge repair with MitraClip device, however, the patient subsequently cancelled the procedure. She had COVID in early May 2023.  She subsequently developed a sinus infection and was evaluated at Urgent Care on 03/22/2022 in setting of fever, chills.  She was discharged home on antibiotic therapy.  She presents today for follow-up. Since her last visit she reports ongoing dyspnea at rest and on exertion.  She states her symptoms improve when she takes her Lasix, however, she has not been taking her Lasix regularly. She denies weight gain, edema, PND, orthopnea, denies symptoms concerning for angina. Additionally, she notes a 2-year history of right hand tremor. She states this bothers her. Other than her ongoing shortness of breath and right hand tremor, she denies any additional symptoms or concerns today.  Home Medications    Current Outpatient Medications  Medication Sig Dispense Refill   amoxicillin (AMOXIL) 500 MG capsule Take one cap PO Q8hr. 30 capsule 0    amoxicillin (AMOXIL) 500 MG tablet Take 4 tablets (2,000 mg) one hour prior to all dental visits. 8 tablet 11   Ascorbic Acid (VITAMIN C PO) Take 1,000 mg by mouth daily.     aspirin EC 81 MG tablet Take 1 tablet (81 mg total) by mouth daily. Swallow whole. 90 tablet 3   carvedilol (COREG) 25 MG tablet Take 1 tablet (25 mg total) by mouth daily. 90 tablet 3   cholecalciferol (VITAMIN D) 25 MCG (1000 UNIT) tablet Take 1,000 Units by mouth daily.     gabapentin (NEURONTIN) 100 MG capsule 100 mg taken by mouth in the morning and 300 mg taken by mouth in the evening     gabapentin (NEURONTIN) 300 MG capsule Take 300 mg by mouth at bedtime.     ibandronate (BONIVA) 150 MG tablet Take 1 tablet (150 mg total) by mouth every 30 (thirty) days. Take in the morning with a full glass of water, on an empty stomach, and do not take anything else by mouth or lie down  for the next 30 min. 3 tablet 3   Multiple Vitamin (MULTIVITAMIN WITH MINERALS) TABS tablet Take 1 tablet by mouth daily.     Multiple Vitamins-Minerals (PRESERVISION AREDS PO) Take 1 tablet by mouth in the morning and at bedtime.      Multiple Vitamins-Minerals (ZINC PO) Take 50 mg by mouth daily.     nitroGLYCERIN (NITRODUR - DOSED IN MG/24 HR) 0.2 mg/hr patch 1/2 patch over L Achilles tendon bid 30 patch 12   omeprazole (PRILOSEC) 20 MG capsule Take 20 mg by mouth daily.     pantoprazole (PROTONIX) 40 MG tablet Take 40 mg by mouth 2 (two) times daily.     potassium chloride (KLOR-CON) 10 MEQ tablet Take 10 mEq by mouth daily.     sodium chloride (MURO 128) 5 % ophthalmic solution Place 1 drop into both eyes at bedtime.     traZODone (DESYREL) 50 MG tablet 1-2 tabs po qhs as needed for insomnia 30 tablet 1   vitamin E 180 MG (400 UNITS) capsule Take 400 Units by mouth daily.     furosemide (LASIX) 20 MG tablet Take 1 tablet (20 mg total) by mouth daily. 90 tablet 3   No current facility-administered medications for this visit.     Review of  Systems    She denies chest pain, palpitations, dyspnea, pnd, orthopnea, n, v, dizziness, syncope, edema, weight gain, or early satiety. All other systems reviewed and are otherwise negative except as noted above.   Physical Exam    VS:  BP 140/80   Pulse 73   Ht '5\' 3"'$  (1.6 m)   Wt 117 lb 12.8 oz (53.4 kg)   PF 95 L/min   BMI 20.87 kg/m   GEN: Well nourished, well developed, in no acute distress. HEENT: normal. Neck: Supple, no JVD, carotid bruits, or masses. Cardiac: RRR, 3/6 murmur, no rubs, or gallops. No clubbing, cyanosis, edema.  Radials/DP/PT 2+ and equal bilaterally.  Respiratory:  Respirations regular and unlabored, clear to auscultation bilaterally. GI: Soft, nontender, nondistended, BS + x 4. MS: no deformity or atrophy. Skin: warm and dry, no rash. Neuro:  Strength and sensation are intact. Psych: Normal affect.  Accessory Clinical Findings    ECG personally reviewed by me today - NSR, 73 bpm - no acute changes.  Lab Results  Component Value Date   WBC 6.9 01/25/2022   HGB 13.3 01/25/2022   HCT 41.3 01/25/2022   MCV 87 01/25/2022   PLT 256 01/25/2022   Lab Results  Component Value Date   CREATININE 0.56 (L) 01/25/2022   BUN 20 01/25/2022   NA 146 (H) 01/25/2022   K 4.7 01/25/2022   CL 105 01/25/2022   CO2 27 01/25/2022   Lab Results  Component Value Date   ALT 6 07/22/2021   AST 12 07/22/2021   ALKPHOS 69 07/22/2021   BILITOT 0.7 07/22/2021   Lab Results  Component Value Date   CHOL 139 07/02/2020   HDL 80.30 07/02/2020   LDLCALC 53 07/02/2020   TRIG 30.0 07/02/2020   CHOLHDL 2 07/02/2020    No results found for: "HGBA1C"  Assessment & Plan    1. Mitral valve regurgitation: S/p transcatheter edge-to-edge mitral valve repair with MitraClip in 10/2020. Most recent echo in January 2023 showed EF 60 to 65%, no RWMA, severely dilated left atrium, mildly dilated right atrium, moderate to 0 mitral valve regurgitation, mean gradient 5 mmHg, small  ASD.  Most recent outpatient follow-up visit, Dr. Burt Knack  recommended TEE.  However, patient declined.  She reports ongoing dyspnea, improved when she takes Lasix. Will increase Lasix to 20 mg daily, will have her take her potassium 10 mill equivalents daily.  Recommend BMET in 2 weeks.  She prefers to have this drawn through her PCP. Discussed possibility of TEE.  Patient declines at this time.  She would like to discuss her options further with Dr. Burt Knack.  Will arrange for follow-up with Dr. Burt Knack.  2. Chronic diastolic heart failure: Most recent echo as above. She does have some ongoing dyspnea at rest and on exertion. Otherwise, euvolemic on exam. Will increase Lasix as above.  Continue carvedilol.  3. Pulmonary hypertension: Pulmonary pressures improved following MitraClip however they did not fully normalize.   4. Hypertension: BP elevated initially in office today.  Improved upon reassessment.  Encouraged ongoing BP monitoring at home.  She will report BP consistently >130-140/80.  5. R hand tremor: She reports a 2-year history of right hand tremor.  She states this bothers her.  Recommend follow-up with PCP.  6. Disposition: Follow-up with Dr. Burt Knack in 2 to 3 months, follow-up with Dr. Tamala Julian, primary cardiologist, in 6 months.  Lenna Sciara, NP 04/05/2022, 4:15 PM

## 2022-04-20 ENCOUNTER — Telehealth: Payer: Self-pay

## 2022-04-20 NOTE — Telephone Encounter (Signed)
Pt scheduled for appt 6/28 with provider  [facility] Day - Client TELEPHONE ADVICE RECORD AccessNurse Patient Name: Erin [name] [facility] Gender: Female DOB: 11-Jan-1935 Age: 87 Y 1 M 26 D Return Phone Number: [phone] (Primary) Address: City/State/Zip: [place] [address] Client [facility] Day - Client Client Site  [facility] [place] - Day Provider Santiago Bumpers - MD Contact Type Call Who Is Calling Patient / Member / Family / Caregiver Call Type Triage / Clinical Relationship To Patient Self Return Phone Number [phone] (Primary) Chief Complaint Nasal Congestion Reason for Call Symptomatic / Request for Health Information Initial Comment Caller states she is having nasal congestion. She is having reddish-brown mucus when blowing nose. Translation No Nurse Assessment Nurse: Annye English, RN, Denise Date/Time (Eastern Time): 04/20/2022 8:22:10 AM Confirm and document reason for call. If symptomatic, describe symptoms. ---Caller states she is having nasal congestion. She is having reddish-brown mucus when blowing nose. Using Afrin nasal spray since May when tx for sinus infection. Does the patient have any new or worsening symptoms? ---Yes Will a triage be completed? ---Yes Related visit to physician within the last 2 weeks? ---No Does the PT have any chronic conditions? (i.e. diabetes, asthma, this includes High risk factors for pregnancy, etc.) ---Yes List chronic conditions. ---CAD w/micro valve Is this a behavioral health or substance abuse call? ---No Guidelines Guideline Title Affirmed Question Affirmed Notes Nurse Date/Time Lamount Cohen Time) Sinus Pain or Congestion [1] Sinus congestion (pressure, fullness) AND [2] present > 10 days Carmon, RN, Angelique Blonder 04/20/2022 8:23:26 AM Disp. Time Lamount Cohen Time) Disposition Final User 04/20/2022 8:25:40 AM SEE PCP WITHIN 3 DAYS Yes Carmon, RN, Denise PLEASE NOTE: All timestamps  contained within this report are represented as [place] Standard Time. CONFIDENTIALTY NOTICE: This fax transmission is intended only for the addressee. It contains information that is legally privileged, confidential or otherwise protected from use or disclosure. If you are not the intended recipient, you are strictly prohibited from reviewing, disclosing, copying using or disseminating any of this information or taking any action in reliance on or regarding this information. If you have received this fax in error, please notify us immediately by telephone so that we can arrange for its return to [place]. Phone: [phone], Toll-Free: [phone], Fax: [phone] Page: 2 of 2 Call Id: [id number] Caller Disagree/Comply Comply Caller Understands Yes PreDisposition Call Doctor Care Advice Given Per Guideline SEE PCP WITHIN 3 DAYS: NASAL WASHES FOR A STUFFY NOSE: * Introduction: Saline (salt water) nasal irrigation (nasal wash) is an effective and simple home remedy for treating stuffy nose and sinus congestion. The nose can be irrigated by pouring, spraying, or squirting salt water into the nose and then letting it run back out. * How it Helps: The salt water rinses out excess mucus and washes out any irritants (dust, allergens) that might be present. It also moistens the nasal cavity. PAIN MEDICINES: * ACETAMINOPHEN - EXTRA STRENGTH TYLENOL: Take 1,000 mg (two 500 mg pills) every 6 to 8 hours as needed. Each Extra Strength Tylenol pill has 500 mg of acetaminophen. The most you should take is 6 pills a day (3,000 mg total). Note: In [place], the maximum is 8 pills a day (4,000 mg total). CALL BACK IF: * You become worse CARE ADVICE given per Sinus Pain or Congestion (Adult) guideline. Referrals REFERRED TO PCP OFFICE

## 2022-04-21 ENCOUNTER — Encounter: Payer: Self-pay | Admitting: Family Medicine

## 2022-04-21 ENCOUNTER — Ambulatory Visit (INDEPENDENT_AMBULATORY_CARE_PROVIDER_SITE_OTHER): Payer: Medicare Other | Admitting: Family Medicine

## 2022-04-21 VITALS — BP 178/72 | HR 59 | Temp 98.0°F | Ht 63.0 in | Wt 115.6 lb

## 2022-04-21 DIAGNOSIS — J019 Acute sinusitis, unspecified: Secondary | ICD-10-CM

## 2022-04-21 MED ORDER — AZELASTINE HCL 0.1 % NA SOLN
2.0000 | Freq: Two times a day (BID) | NASAL | 12 refills | Status: DC
Start: 2022-04-21 — End: 2023-05-09

## 2022-04-21 MED ORDER — AMOXICILLIN-POT CLAVULANATE 875-125 MG PO TABS: 1.0000 | ORAL_TABLET | Freq: Two times a day (BID) | ORAL | 0 refills | Status: DC

## 2022-04-21 NOTE — Progress Notes (Signed)
OFFICE VISIT  04/21/2022  CC:  Chief Complaint  Patient presents with   Sinus Problem    COVID in may; congestion since COVID    Patient is a 87 y.o. female who presents for sinus concerns.  HPI: Reports greater than 3 weeks history of nasal congestion, gradually progressing.  Thick mucus from both nares.  No fever or cough or sore throat. ED visit 03/22/22, dx'd with subacute sinusitis, rx'd doxy and tessalon.  Doxy not tolerated due to nausea.  She was switched to amoxicillin, which she says did help some but symptoms then rebounded worse. Covid + illness in early May.  Used Afrin for a few days then stopped it yesterday at the instruction of our nurse Using nasal saline spray  Says home blood pressures have consistently been normal  Past Medical History:  Diagnosis Date   Achilles tendon rupture    left, no surgery required   Chronic diastolic heart failure ([place])    Gross hematuria 12/2020   Klebsiella on urine clx x2 but no UTI sx's.  CT showed tiny stones in each renal pelvis but no ureteral or bladder stones, no mass.  Urol did cysto->normal. Plan is annual UA, rpt hematuria w/u in 3-5 yrs if persistent pos.   History of kidney stones    Hypertension    Hypertensive retinopathy of both eyes    Dr. Herbert Deaner   Low back pain    spondylosis, listhesis, +scoliosis at L/S jxn   Lower extremity edema    lasix prn   OA (osteoarthritis)    Osteopenia    Osteoporosis 07/2021   07/2021 T score -4.6   S/P mitral valve clip implantation 11/06/2020   s/p TEER with one XTW MitraClip on A2P2 by Dr. Burt Knack.  DAPT w/ASA and plavix x 3 mo post procedure recommended   Severe mitral regurgitation    2021 transthoracic echo and TEE->cardiology following.  Mitral valve clip 10/2020.  f/u echo 10/28/20 mild/mod MVR and tricusp regurg   Severe pulmonary hypertension ([place]) 2021   transth echo; moderate by TEE 94/1740   Systolic murmur 81/44/8185   severe mitral regurge, mod/severe tricuspic  regurg    Past Surgical History:  Procedure Laterality Date   APPENDECTOMY     87 yrs old   [place]  2004   ruptured disc   CHOLECYSTECTOMY     age 75   CYSTOSCOPY  02/24/2021   (for gross hematuria) ->normal.   DEXA  07/2021   2022 T score -4.6 (radius)   LUMBAR LAMINECTOMY  2004   MITRAL VALVE REPAIR N/A 11/06/2020   Procedure: MITRAL VALVE REPAIR;  Surgeon: Sherren Mocha, MD;  Location: [place] [facility];  Service: Cardiovascular;  Laterality: N/A;   RIGHT/LEFT HEART CATH AND CORONARY ANGIOGRAPHY N/A 09/16/2020   No CAD. Procedure: RIGHT/LEFT HEART CATH AND CORONARY ANGIOGRAPHY;  Surgeon: Belva Crome, MD;  Location: [place] [facility];  Service: Cardiovascular;  Laterality: N/A;   TEE WITHOUT CARDIOVERSION N/A 09/16/2020   Procedure: TRANSESOPHAGEAL ECHOCARDIOGRAM (TEE);  Surgeon: Buford Dresser, MD;  Location: [facility] ENDOSCOPY;  Service: Cardiovascular;  Laterality: N/A;   TEE WITHOUT CARDIOVERSION N/A 11/06/2020   Procedure: TRANSESOPHAGEAL ECHOCARDIOGRAM (TEE);  Surgeon: Sherren Mocha, MD;  Location: [place] [facility];  Service: Cardiovascular;  Laterality: N/A;   TONSILLECTOMY     age 78   TOTAL KNEE ARTHROPLASTY Left 06/02/2021   Procedure: LEFT TOTAL KNEE ARTHROPLASTY;  Surgeon: Renette Butters, MD;  Location: [facility];  Service:  Orthopedics;  Laterality: Left;   TRANSTHORACIC ECHOCARDIOGRAM  07/03/2020; 10/28/20; 12/03/20   06/2020 TTE EF 60-65%, grd II DD, severe pulm art HTN, severe mitral valve regurg, mod/sev tricuspic valve regurg. Conf on TEE 08/2020.  10/28/20 (s/p MV clip procedure) mild-mod MVR, EF 55-60%. 11/2020 EF 60%, mild/mod MR w/mean grad 21mm hg and mod to sev TR.    Outpatient Medications Prior to Visit  Medication Sig Dispense Refill   Ascorbic Acid (VITAMIN C PO) Take 1,000 mg by mouth daily.     carvedilol (COREG) 25 MG tablet Take 1 tablet (25 mg total) by mouth daily. 90 tablet 3   cholecalciferol (VITAMIN D) 25 MCG (1000 UNIT) tablet Take  1,000 Units by mouth daily.     furosemide (LASIX) 20 MG tablet Take 1 tablet (20 mg total) by mouth daily. 90 tablet 3   gabapentin (NEURONTIN) 100 MG capsule 100 mg taken by mouth in the morning and 300 mg taken by mouth in the evening     gabapentin (NEURONTIN) 300 MG capsule Take 300 mg by mouth at bedtime.     ibandronate (BONIVA) 150 MG tablet Take 1 tablet (150 mg total) by mouth every 30 (thirty) days. Take in the morning with a full glass of water, on an empty stomach, and do not take anything else by mouth or lie down for the next 30 min. 3 tablet 3   Multiple Vitamin (MULTIVITAMIN WITH MINERALS) TABS tablet Take 1 tablet by mouth daily.     Multiple Vitamins-Minerals (PRESERVISION AREDS PO) Take 1 tablet by mouth in the morning and at bedtime.      Multiple Vitamins-Minerals (ZINC PO) Take 50 mg by mouth daily.     nitroGLYCERIN (NITRODUR - DOSED IN MG/24 HR) 0.2 mg/hr patch 1/2 patch over L Achilles tendon bid 30 patch 12   omeprazole (PRILOSEC) 20 MG capsule Take 20 mg by mouth daily.     pantoprazole (PROTONIX) 40 MG tablet Take 40 mg by mouth 2 (two) times daily.     potassium chloride (KLOR-CON) 10 MEQ tablet Take 10 mEq by mouth daily.     sodium chloride (MURO 128) 5 % ophthalmic solution Place 1 drop into both eyes at bedtime.     vitamin E 180 MG (400 UNITS) capsule Take 400 Units by mouth daily.     amoxicillin (AMOXIL) 500 MG capsule Take one cap PO Q8hr. 30 capsule 0   aspirin EC 81 MG tablet Take 1 tablet (81 mg total) by mouth daily. Swallow whole. 90 tablet 3   traZODone (DESYREL) 50 MG tablet 1-2 tabs po qhs as needed for insomnia (Patient not taking: Reported on 04/21/2022) 30 tablet 1   amoxicillin (AMOXIL) 500 MG tablet Take 4 tablets (2,000 mg) one hour prior to all dental visits. (Patient not taking: Reported on 04/21/2022) 8 tablet 11   No facility-administered medications prior to visit.    Allergies  Allergen Reactions   Barbiturates Nausea And Vomiting    Demerol Other (See Comments)    sick   Doxycycline Nausea Only   Hydrochlorothiazide Other (See Comments)    unknown   Irbesartan     HA   Losartan Potassium     hair loss   Olmesartan Medoxomil     hair loss   Telmisartan     cramps    ROS As per HPI  PE:    04/21/2022    3:26 PM 04/05/2022    4:15 PM 04/05/2022    3:21  PM  Vitals with BMI  Height $Remov'5\' 3"'OEYLVw$   '5\' 3"'$   Weight 115 lbs 10 oz  117 lbs 13 oz  BMI 01.74  94.49  Systolic 675 916 384  Diastolic 72 80 94  Pulse 59  73     Physical Exam  VS: noted--normal. Gen: alert, NAD, NONTOXIC APPEARING. HEENT: eyes without injection, drainage, or swelling.  Ears: EACs clear, TMs with normal light reflex and landmarks.  Nose: thick dull yellow/greenish mucous bilat, edematous on R.  L lateral nasal wall mucosal defect. Mild R paranasal sinus TTP.  No facial swelling.  Throat and mouth without focal lesion.  No pharyngial swelling, erythema, or exudate.   Neck: supple, no LAD.   LUNGS: CTA bilat, nonlabored resps.   CV: RRR, 3/6 syst murmur unchanged, no diastolic murmur EXT: no c/c/e SKIN: no rash   LABS:  Last CBC Lab Results  Component Value Date   WBC 6.9 01/25/2022   HGB 13.3 01/25/2022   HCT 41.3 01/25/2022   MCV 87 01/25/2022   MCH 27.9 01/25/2022   RDW 14.7 01/25/2022   PLT 256 66/59/9357   Last metabolic panel Lab Results  Component Value Date   GLUCOSE 91 01/25/2022   NA 146 (H) 01/25/2022   K 4.7 01/25/2022   CL 105 01/25/2022   CO2 27 01/25/2022   BUN 20 01/25/2022   CREATININE 0.56 (L) 01/25/2022   EGFR 90 01/25/2022   CALCIUM 9.8 01/25/2022   PROT 6.5 07/22/2021   ALBUMIN 4.0 07/22/2021   BILITOT 0.7 07/22/2021   ALKPHOS 69 07/22/2021   AST 12 07/22/2021   ALT 6 07/22/2021   ANIONGAP 6 07/03/2021   IMPRESSION AND PLAN:  Acute sinusitis. She has chronic defect in the left nasal passage due to old sinus surgery decades ago. Augmentin 875 twice daily x14 days. Astelin 2 sprays every 12  hours. Continue nasal saline spray.   An After Visit Summary was printed and given to the patient.  FOLLOW UP: Return for pt to make future appt for med f/u.  Signed:  Crissie Sickles, MD           04/21/2022

## 2022-04-28 ENCOUNTER — Ambulatory Visit: Payer: Medicare Other | Admitting: Family Medicine

## 2022-04-28 NOTE — Progress Notes (Deleted)
OFFICE VISIT  04/28/2022  CC: No chief complaint on file.   Patient is a 87 y.o. female who presents for f/u HTN, insomnia, and right hand tremor.  INTERIM HX: ***  Past Medical History:  Diagnosis Date   Achilles tendon rupture    left, no surgery required   Chronic diastolic heart failure ([place])    Gross hematuria 12/2020   Klebsiella on urine clx x2 but no UTI sx's.  CT showed tiny stones in each renal pelvis but no ureteral or bladder stones, no mass.  Urol did cysto->normal. Plan is annual UA, rpt hematuria w/u in 3-5 yrs if persistent pos.   History of kidney stones    Hypertension    Hypertensive retinopathy of both eyes    Dr. Herbert Deaner   Low back pain    spondylosis, listhesis, +scoliosis at L/S jxn   Lower extremity edema    lasix prn   OA (osteoarthritis)    Osteopenia    Osteoporosis 07/2021   07/2021 T score -4.6   S/P mitral valve clip implantation 11/06/2020   s/p TEER with one XTW MitraClip on A2P2 by Dr. Burt Knack.  DAPT w/ASA and plavix x 3 mo post procedure recommended   Severe mitral regurgitation    2021 transthoracic echo and TEE->cardiology following.  Mitral valve clip 10/2020.  f/u echo 10/28/20 mild/mod MVR and tricusp regurg   Severe pulmonary hypertension ([place]) 2021   transth echo; moderate by TEE 92/9244   Systolic murmur 62/86/3817   severe mitral regurge, mod/severe tricuspic regurg    Past Surgical History:  Procedure Laterality Date   APPENDECTOMY     87 yrs old   [place]  2004   ruptured disc   CHOLECYSTECTOMY     age 33   CYSTOSCOPY  02/24/2021   (for gross hematuria) ->normal.   DEXA  07/2021   2022 T score -4.6 (radius)   LUMBAR LAMINECTOMY  2004   MITRAL VALVE REPAIR N/A 11/06/2020   Procedure: MITRAL VALVE REPAIR;  Surgeon: Sherren Mocha, MD;  Location: [facility];  Service: Cardiovascular;  Laterality: N/A;   RIGHT/LEFT HEART CATH AND CORONARY ANGIOGRAPHY N/A 09/16/2020   No CAD. Procedure: RIGHT/LEFT HEART CATH AND  CORONARY ANGIOGRAPHY;  Surgeon: Belva Crome, MD;  Location: [facility];  Service: Cardiovascular;  Laterality: N/A;   TEE WITHOUT CARDIOVERSION N/A 09/16/2020   Procedure: TRANSESOPHAGEAL ECHOCARDIOGRAM (TEE);  Surgeon: Buford Dresser, MD;  Location: [facility];  Service: Cardiovascular;  Laterality: N/A;   TEE WITHOUT CARDIOVERSION N/A 11/06/2020   Procedure: TRANSESOPHAGEAL ECHOCARDIOGRAM (TEE);  Surgeon: Sherren Mocha, MD;  Location: [place] [facility] [facility];  Service: Cardiovascular;  Laterality: N/A;   TONSILLECTOMY     age 78   TOTAL KNEE ARTHROPLASTY Left 06/02/2021   Procedure: LEFT TOTAL KNEE ARTHROPLASTY;  Surgeon: Renette Butters, MD;  Location: [facility];  Service: Orthopedics;  Laterality: Left;   TRANSTHORACIC ECHOCARDIOGRAM  07/03/2020; 10/28/20; 12/03/20   06/2020 TTE EF 60-65%, grd II DD, severe pulm art HTN, severe mitral valve regurg, mod/sev tricuspic valve regurg. Conf on TEE 08/2020.  10/28/20 (s/p MV clip procedure) mild-mod MVR, EF 55-60%. 11/2020 EF 60%, mild/mod MR w/mean grad 50mm hg and mod to sev TR.    Outpatient Medications Prior to Visit  Medication Sig Dispense Refill   amoxicillin-clavulanate (AUGMENTIN) 875-125 MG tablet Take 1 tablet by mouth 2 (two) times daily. 28 tablet 0   Ascorbic Acid (VITAMIN C PO) Take 1,000 mg by mouth  daily.     azelastine (ASTELIN) 0.1 % nasal spray Place 2 sprays into both nostrils 2 (two) times daily. 30 mL 12   carvedilol (COREG) 25 MG tablet Take 1 tablet (25 mg total) by mouth daily. 90 tablet 3   cholecalciferol (VITAMIN D) 25 MCG (1000 UNIT) tablet Take 1,000 Units by mouth daily.     furosemide (LASIX) 20 MG tablet Take 1 tablet (20 mg total) by mouth daily. 90 tablet 3   gabapentin (NEURONTIN) 300 MG capsule Take 300 mg by mouth at bedtime.     Multiple Vitamin (MULTIVITAMIN WITH MINERALS) TABS tablet Take 1 tablet by mouth daily.     Multiple Vitamins-Minerals (PRESERVISION AREDS PO) Take 1 tablet by mouth in the  morning and at bedtime.      Multiple Vitamins-Minerals (ZINC PO) Take 50 mg by mouth daily.     nitroGLYCERIN (NITRODUR - DOSED IN MG/24 HR) 0.2 mg/hr patch 1/2 patch over L Achilles tendon bid 30 patch 12   omeprazole (PRILOSEC) 20 MG capsule Take 20 mg by mouth daily.     pantoprazole (PROTONIX) 40 MG tablet Take 40 mg by mouth 2 (two) times daily.     potassium chloride (KLOR-CON) 10 MEQ tablet Take 10 mEq by mouth daily.     sodium chloride (MURO 128) 5 % ophthalmic solution Place 1 drop into both eyes at bedtime.     vitamin E 180 MG (400 UNITS) capsule Take 400 Units by mouth daily.     No facility-administered medications prior to visit.    Allergies  Allergen Reactions   Barbiturates Nausea And Vomiting   Demerol Other (See Comments)    sick   Doxycycline Nausea Only   Hydrochlorothiazide Other (See Comments)    unknown   Irbesartan     HA   Losartan Potassium     hair loss   Olmesartan Medoxomil     hair loss   Telmisartan     cramps    ROS As per HPI  PE:    04/21/2022    3:26 PM 04/05/2022    4:15 PM 04/05/2022    3:21 PM  Vitals with BMI  Height $Remov'5\' 3"'XbSIBg$   '5\' 3"'$   Weight 115 lbs 10 oz  117 lbs 13 oz  BMI 76.81  15.72  Systolic 620 355 974  Diastolic 72 80 94  Pulse 59  73   Physical Exam  ***  LABS:  Last CBC Lab Results  Component Value Date   WBC 6.9 01/25/2022   HGB 13.3 01/25/2022   HCT 41.3 01/25/2022   MCV 87 01/25/2022   MCH 27.9 01/25/2022   RDW 14.7 01/25/2022   PLT 256 16/38/4536   Last metabolic panel Lab Results  Component Value Date   GLUCOSE 91 01/25/2022   NA 146 (H) 01/25/2022   K 4.7 01/25/2022   CL 105 01/25/2022   CO2 27 01/25/2022   BUN 20 01/25/2022   CREATININE 0.56 (L) 01/25/2022   EGFR 90 01/25/2022   CALCIUM 9.8 01/25/2022   PROT 6.5 07/22/2021   ALBUMIN 4.0 07/22/2021   BILITOT 0.7 07/22/2021   ALKPHOS 69 07/22/2021   AST 12 07/22/2021   ALT 6 07/22/2021   ANIONGAP 6 07/03/2021   Last lipids Lab Results   Component Value Date   CHOL 139 07/02/2020   HDL 80.30 07/02/2020   LDLCALC 53 07/02/2020   TRIG 30.0 07/02/2020   CHOLHDL 2 07/02/2020   Last thyroid functions Lab Results  Component Value Date   TSH 2.37 07/02/2020   IMPRESSION AND PLAN:  No problem-specific Assessment & Plan notes found for this encounter.  Osteoporosis: July 26, 2021 showed T score -4.6.  I recommended she start Boniva-->***.  An After Visit Summary was printed and given to the patient.  FOLLOW UP: No follow-ups on file.  Signed:  Crissie Sickles, MD           04/28/2022

## 2022-04-29 ENCOUNTER — Ambulatory Visit: Payer: Medicare Other | Admitting: Family Medicine

## 2022-04-29 DIAGNOSIS — M810 Age-related osteoporosis without current pathological fracture: Secondary | ICD-10-CM

## 2022-05-03 ENCOUNTER — Ambulatory Visit (INDEPENDENT_AMBULATORY_CARE_PROVIDER_SITE_OTHER): Payer: Medicare Other | Admitting: Family Medicine

## 2022-05-03 ENCOUNTER — Encounter: Payer: Self-pay | Admitting: Family Medicine

## 2022-05-03 VITALS — BP 118/56 | HR 56 | Temp 98.0°F | Ht 63.0 in | Wt 113.4 lb

## 2022-05-03 DIAGNOSIS — R0609 Other forms of dyspnea: Secondary | ICD-10-CM

## 2022-05-03 DIAGNOSIS — M810 Age-related osteoporosis without current pathological fracture: Secondary | ICD-10-CM | POA: Diagnosis not present

## 2022-05-03 DIAGNOSIS — J019 Acute sinusitis, unspecified: Secondary | ICD-10-CM | POA: Diagnosis not present

## 2022-05-03 DIAGNOSIS — T887XXA Unspecified adverse effect of drug or medicament, initial encounter: Secondary | ICD-10-CM

## 2022-05-03 DIAGNOSIS — I10 Essential (primary) hypertension: Secondary | ICD-10-CM

## 2022-05-03 DIAGNOSIS — R197 Diarrhea, unspecified: Secondary | ICD-10-CM

## 2022-05-03 MED ORDER — PANTOPRAZOLE SODIUM 40 MG PO TBEC: 40.0000 mg | DELAYED_RELEASE_TABLET | Freq: Two times a day (BID) | ORAL | 1 refills | Status: DC

## 2022-05-03 MED ORDER — IBANDRONATE SODIUM 150 MG PO TABS: 150.0000 mg | ORAL_TABLET | ORAL | 3 refills | Status: DC

## 2022-05-03 NOTE — Progress Notes (Signed)
OFFICE VISIT  05/03/2022  CC:  Chief Complaint  Patient presents with   Hypertension    Patient is a 87 y.o. female who presents for follow-up hypertension, osteoporosis, and recent acute sinusitis. I last saw her about 2 weeks ago. A/P as of that visit: "Acute sinusitis. She has chronic defect in the left nasal passage due to old sinus surgery decades ago. Augmentin 875 twice daily x14 days. Astelin 2 sprays every 12 hours. Continue nasal saline spray."  INTERIM HX: Says her nasal congestion and nasal mucus is much much better. However in the last few days she has started develop frequent loose stool.  No abdominal pain, no nausea vomiting, no fever, no blood in stool.   Home blood pressure lately has been less than 130/70 more consistently. When she gets anxious or is in pain does go up briefly.  Cardiology follow-up 04/05/2022--> Lasix 20 mg daily for ongoing DOE.  Past Medical History:  Diagnosis Date   Achilles tendon rupture    left, no surgery required   Chronic diastolic heart failure ([place])    Gross hematuria 12/2020   Klebsiella on urine clx x2 but no UTI sx's.  CT showed tiny stones in each renal pelvis but no ureteral or bladder stones, no mass.  Urol did cysto->normal. Plan is annual UA, rpt hematuria w/u in 3-5 yrs if persistent pos.   History of kidney stones    Hypertension    Hypertensive retinopathy of both eyes    Dr. Herbert Deaner   Low back pain    spondylosis, listhesis, +scoliosis at L/S jxn   Lower extremity edema    lasix prn   OA (osteoarthritis)    Osteopenia    Osteoporosis 07/2021   07/2021 T score -4.6   S/P mitral valve clip implantation 11/06/2020   s/p TEER with one XTW MitraClip on A2P2 by Dr. Burt Knack.  DAPT w/ASA and plavix x 3 mo post procedure recommended   Severe mitral regurgitation    2021 transthoracic echo and TEE->cardiology following.  Mitral valve clip 10/2020.  f/u echo 10/28/20 mild/mod MVR and tricusp regurg   Severe pulmonary  hypertension ([place]) 2021   transth echo; moderate by TEE 50/0370   Systolic murmur 48/88/9169   severe mitral regurge, mod/severe tricuspic regurg   Tremor of right hand     Past Surgical History:  Procedure Laterality Date   APPENDECTOMY     87 yrs old   [place]  2004   ruptured disc   CHOLECYSTECTOMY     age 85   CYSTOSCOPY  02/24/2021   (for gross hematuria) ->normal.   DEXA  07/2021   2022 T score -4.6 (radius)   LUMBAR LAMINECTOMY  2004   MITRAL VALVE REPAIR N/A 11/06/2020   Procedure: MITRAL VALVE REPAIR;  Surgeon: Sherren Mocha, MD;  Location: [facility];  Service: Cardiovascular;  Laterality: N/A;   RIGHT/LEFT HEART CATH AND CORONARY ANGIOGRAPHY N/A 09/16/2020   No CAD. Procedure: RIGHT/LEFT HEART CATH AND CORONARY ANGIOGRAPHY;  Surgeon: Belva Crome, MD;  Location: [facility];  Service: Cardiovascular;  Laterality: N/A;   TEE WITHOUT CARDIOVERSION N/A 09/16/2020   Procedure: TRANSESOPHAGEAL ECHOCARDIOGRAM (TEE);  Surgeon: Buford Dresser, MD;  Location: [facility];  Service: Cardiovascular;  Laterality: N/A;   TEE WITHOUT CARDIOVERSION N/A 11/06/2020   Procedure: TRANSESOPHAGEAL ECHOCARDIOGRAM (TEE);  Surgeon: Sherren Mocha, MD;  Location: [facility];  Service: Cardiovascular;  Laterality: N/A;   TONSILLECTOMY     age  18   TOTAL KNEE ARTHROPLASTY Left 06/02/2021   Procedure: LEFT TOTAL KNEE ARTHROPLASTY;  Surgeon: Renette Butters, MD;  Location: [facility];  Service: Orthopedics;  Laterality: Left;   TRANSTHORACIC ECHOCARDIOGRAM  07/03/2020; 10/28/20; 12/03/20   06/2020 TTE EF 60-65%, grd II DD, severe pulm art HTN, severe mitral valve regurg, mod/sev tricuspic valve regurg. Conf on TEE 08/2020.  10/28/20 (s/p MV clip procedure) mild-mod MVR, EF 55-60%. 11/2020 EF 60%, mild/mod MR w/mean grad 60mm hg and mod to sev TR.    Outpatient Medications Prior to Visit  Medication Sig Dispense Refill   amoxicillin-clavulanate (AUGMENTIN) 875-125 MG  tablet Take 1 tablet by mouth 2 (two) times daily. 28 tablet 0   Ascorbic Acid (VITAMIN C PO) Take 1,000 mg by mouth daily.     azelastine (ASTELIN) 0.1 % nasal spray Place 2 sprays into both nostrils 2 (two) times daily. 30 mL 12   carvedilol (COREG) 25 MG tablet Take 1 tablet (25 mg total) by mouth daily. 90 tablet 3   cholecalciferol (VITAMIN D) 25 MCG (1000 UNIT) tablet Take 1,000 Units by mouth daily.     furosemide (LASIX) 20 MG tablet Take 1 tablet (20 mg total) by mouth daily. 90 tablet 3   gabapentin (NEURONTIN) 300 MG capsule Take 300 mg by mouth at bedtime.     Multiple Vitamin (MULTIVITAMIN WITH MINERALS) TABS tablet Take 1 tablet by mouth daily.     Multiple Vitamins-Minerals (PRESERVISION AREDS PO) Take 1 tablet by mouth in the morning and at bedtime.      nitroGLYCERIN (NITRODUR - DOSED IN MG/24 HR) 0.2 mg/hr patch 1/2 patch over L Achilles tendon bid 30 patch 12   omeprazole (PRILOSEC) 20 MG capsule Take 20 mg by mouth daily.     pantoprazole (PROTONIX) 40 MG tablet Take 40 mg by mouth 2 (two) times daily.     potassium chloride (KLOR-CON) 10 MEQ tablet Take 10 mEq by mouth daily.     sodium chloride (MURO 128) 5 % ophthalmic solution Place 1 drop into both eyes at bedtime.     vitamin E 180 MG (400 UNITS) capsule Take 400 Units by mouth daily.     Multiple Vitamins-Minerals (ZINC PO) Take 50 mg by mouth daily.     No facility-administered medications prior to visit.    Allergies  Allergen Reactions   Barbiturates Nausea And Vomiting   Demerol Other (See Comments)    sick   Doxycycline Nausea Only   Hydrochlorothiazide Other (See Comments)    unknown   Irbesartan     HA   Losartan Potassium     hair loss   Olmesartan Medoxomil     hair loss   Telmisartan     cramps    ROS As per HPI  PE:    05/03/2022    1:46 PM 04/21/2022    3:26 PM 04/05/2022    4:15 PM  Vitals with BMI  Height $Remov'5\' 3"'pYSdZG$  $Remove'5\' 3"'FOuEQdZ$    Weight 113 lbs 6 oz 115 lbs 10 oz   BMI 62.56 38.93    Systolic 734 287 681  Diastolic 56 72 80  Pulse 56 59     Physical Exam  Gen: Alert, well appearing.  Patient is oriented to person, place, time, and situation. AFFECT: pleasant, lucid thought and speech. No further exam today  LABS:  Last CBC Lab Results  Component Value Date   WBC 6.9 01/25/2022   HGB 13.3 01/25/2022   HCT 41.3 01/25/2022  MCV 87 01/25/2022   MCH 27.9 01/25/2022   RDW 14.7 01/25/2022   PLT 256 31/43/8887   Last metabolic panel Lab Results  Component Value Date   GLUCOSE 91 01/25/2022   NA 146 (H) 01/25/2022   K 4.7 01/25/2022   CL 105 01/25/2022   CO2 27 01/25/2022   BUN 20 01/25/2022   CREATININE 0.56 (L) 01/25/2022   EGFR 90 01/25/2022   CALCIUM 9.8 01/25/2022   PROT 6.5 07/22/2021   ALBUMIN 4.0 07/22/2021   BILITOT 0.7 07/22/2021   ALKPHOS 69 07/22/2021   AST 12 07/22/2021   ALT 6 07/22/2021   ANIONGAP 6 07/03/2021   Last lipids Lab Results  Component Value Date   CHOL 139 07/02/2020   HDL 80.30 07/02/2020   LDLCALC 53 07/02/2020   TRIG 30.0 07/02/2020   CHOLHDL 2 07/02/2020   Last thyroid functions Lab Results  Component Value Date   TSH 2.37 07/02/2020   IMPRESSION AND PLAN:  #1 osteoporosis. DEXA T score -4.6 back in October 2022.  She has been taking only calcium and vitamin D since that time. Start Boniva 30 mg monthly.  I believe she would tolerate this medication much better than the alendronate weekly since she does have a significant history of dyspepsia/gastritis. Plan repeat DEXA 1 year.  #2 acute sinusitis.  Resolved on Augmentin for the last 11 days. Diarrhea has developed from this medication.  Okay to stop Augmentin at this time.  3 hypertension.  Some sporadic elevation in systolics but overall well controlled. Continue Coreg 25 mg, which she takes once a day.  4.  DOE, chronic.  She has hypertensive heart disease, mitral regurg status post mitral valve clip, and significant pulmonary  hypertension. Cardiology follow-up 04/05/2022--> Lasix 20 mg daily for ongoing DOE. Recheck electrolytes today.  An After Visit Summary was printed and given to the patient.  FOLLOW UP: Return in about 3 months (around 08/03/2022) for routine chronic illness f/u.  Signed:  Crissie Sickles, MD           05/03/2022

## 2022-05-04 LAB — BASIC METABOLIC PANEL
BUN: 23 mg/dL (ref 6–23)
CO2: 30 mEq/L (ref 19–32)
Calcium: 9.5 mg/dL (ref 8.4–10.5)
Chloride: 102 mEq/L (ref 96–112)
Creatinine, Ser: 0.84 mg/dL (ref 0.40–1.20)
GFR: 63.47 mL/min (ref >60.00)
Glucose, Bld: 99 mg/dL (ref 70–99)
Potassium: 4.6 mEq/L (ref 3.5–5.1)
Sodium: 141 mEq/L (ref 135–145)

## 2022-05-05 ENCOUNTER — Telehealth: Payer: Self-pay

## 2022-05-05 NOTE — Telephone Encounter (Signed)
Pt would like to know about getting an inhaler to have on hand in case she feels short of breath or wheezing.   Please review and advise

## 2022-05-05 NOTE — Telephone Encounter (Signed)
An inhaler is used for asthma or emphysema. She has neither of these.   An inhaler will not help her symptoms. Reassure her that she does not need this. Thx

## 2022-05-06 NOTE — Telephone Encounter (Signed)
Pt advised of recommendations.  

## 2022-05-07 ENCOUNTER — Encounter (INDEPENDENT_AMBULATORY_CARE_PROVIDER_SITE_OTHER): Payer: Medicare Other | Admitting: Ophthalmology

## 2022-05-07 DIAGNOSIS — H43813 Vitreous degeneration, bilateral: Secondary | ICD-10-CM | POA: Diagnosis not present

## 2022-05-07 DIAGNOSIS — H348312 Tributary (branch) retinal vein occlusion, right eye, stable: Secondary | ICD-10-CM | POA: Diagnosis not present

## 2022-05-07 DIAGNOSIS — H353121 Nonexudative age-related macular degeneration, left eye, early dry stage: Secondary | ICD-10-CM | POA: Diagnosis not present

## 2022-05-07 DIAGNOSIS — I10 Essential (primary) hypertension: Secondary | ICD-10-CM | POA: Diagnosis not present

## 2022-05-07 DIAGNOSIS — H35033 Hypertensive retinopathy, bilateral: Secondary | ICD-10-CM

## 2022-05-10 ENCOUNTER — Encounter (HOSPITAL_COMMUNITY): Payer: Self-pay | Admitting: Cardiovascular Disease

## 2022-05-25 ENCOUNTER — Ambulatory Visit (HOSPITAL_COMMUNITY): Payer: Medicare Other | Attending: Cardiovascular Disease

## 2022-05-25 DIAGNOSIS — I340 Nonrheumatic mitral (valve) insufficiency: Secondary | ICD-10-CM | POA: Diagnosis not present

## 2022-05-26 LAB — ECHOCARDIOGRAM COMPLETE
Area-P 1/2: 2.99 cm2
MV M vel: 5.44 m/s
MV Peak grad: 118.2 mmHg
MV VTI: 0.54 cm2
S' Lateral: 2.8 cm

## 2022-05-31 ENCOUNTER — Telehealth: Payer: Self-pay | Admitting: Cardiovascular Disease

## 2022-05-31 NOTE — Telephone Encounter (Signed)
Patient calling for echo results 

## 2022-05-31 NOTE — Telephone Encounter (Signed)
Returned call to patient to inform her that Dr Burt Knack hasn't read the ECHO yet, but would be back in office tomorrow and I can get it addressed then.

## 2022-06-01 NOTE — Telephone Encounter (Signed)
See result note from 06/01/22

## 2022-06-02 ENCOUNTER — Encounter: Payer: Self-pay | Admitting: Cardiovascular Disease

## 2022-06-02 ENCOUNTER — Ambulatory Visit (INDEPENDENT_AMBULATORY_CARE_PROVIDER_SITE_OTHER): Payer: Medicare Other | Admitting: Cardiovascular Disease

## 2022-06-02 VITALS — BP 146/80 | HR 72 | Ht 63.0 in | Wt 118.6 lb

## 2022-06-02 DIAGNOSIS — R002 Palpitations: Secondary | ICD-10-CM

## 2022-06-02 DIAGNOSIS — I340 Nonrheumatic mitral (valve) insufficiency: Secondary | ICD-10-CM

## 2022-06-02 NOTE — Progress Notes (Signed)
Cardiology Office Note:    Date:  06/09/2022   ID:  Erin [name], DOB November 04, 1934, MRN [id number]  PCP:  Tammi Sou, MD   [place] Providers Cardiologist:  Sinclair Grooms, MD     Referring MD: Tammi Sou, MD   Chief Complaint  Patient presents with   Shortness of Breath    History of Present Illness:    Erin [name] is a 87 y.o. female with a hx of severe mitral regurgitation status post Teer with MitraClip January 2022.  The patient initially had an acceptable result with mild to moderate residual mitral regurgitation and did well clinically for several months, but earlier this year was noted to have progressive mitral regurgitation now back to the moderately severe to severe range associated with worsening shortness of breath with activity.  She returns today for further discussion after recent echocardiogram showed severe mitral and tricuspid regurgitation as well as increased transmitral gradients up to 7 mmHg mean.  The patient continues to experience exertional dyspnea with daily activities. She also complains of fatigue. She denies chest pain, orthopnea, or PND. No leg edema. Reports worsening heart palpitations. Symptoms have not improved with medical therapy to date. She would like to consider further treatment options if there is an opportunity to improve her symptoms and QOL.  Past Medical History:  Diagnosis Date   Achilles tendon rupture    left, no surgery required   Chronic diastolic heart failure ([place])    Gross hematuria 12/2020   Klebsiella on urine clx x2 but no UTI sx's.  CT showed tiny stones in each renal pelvis but no ureteral or bladder stones, no mass.  Urol did cysto->normal. Plan is annual UA, rpt hematuria w/u in 3-5 yrs if persistent pos.   History of kidney stones    Hypertension    Hypertensive retinopathy of both eyes    Dr. Herbert Deaner   Low back pain    spondylosis, listhesis, +scoliosis at L/S jxn   Lower extremity  edema    lasix prn   OA (osteoarthritis)    Osteopenia    Osteoporosis 07/2021   07/2021 T score -4.6   S/P mitral valve clip implantation 11/06/2020   s/p TEER with one XTW MitraClip on A2P2 by Dr. Burt Knack.  DAPT w/ASA and plavix x 3 mo post procedure recommended   Severe mitral regurgitation    2021 transthoracic echo and TEE->cardiology following.  Mitral valve clip 10/2020.  f/u echo 10/28/20 mild/mod MVR and tricusp regurg   Severe pulmonary hypertension ([place]) 2021   transth echo; moderate by TEE 64/4034   Systolic murmur 74/25/9563   severe mitral regurge, mod/severe tricuspic regurg   Tremor of right hand     Past Surgical History:  Procedure Laterality Date   APPENDECTOMY     87 yrs old   [place]  2004   ruptured disc   CHOLECYSTECTOMY     age 13   CYSTOSCOPY  02/24/2021   (for gross hematuria) ->normal.   DEXA  07/2021   2022 T score -4.6 (radius)   LUMBAR LAMINECTOMY  2004   MITRAL VALVE REPAIR N/A 11/06/2020   Procedure: MITRAL VALVE REPAIR;  Surgeon: Sherren Mocha, MD;  Location: [place];  Service: Cardiovascular;  Laterality: N/A;   RIGHT/LEFT HEART CATH AND CORONARY ANGIOGRAPHY N/A 09/16/2020   No CAD. Procedure: RIGHT/LEFT HEART CATH AND CORONARY ANGIOGRAPHY;  Surgeon: Belva Crome, MD;  Location: [place] [facility];  Service: Cardiovascular;  Laterality: N/A;   TEE WITHOUT CARDIOVERSION N/A 09/16/2020   Procedure: TRANSESOPHAGEAL ECHOCARDIOGRAM (TEE);  Surgeon: Buford Dresser, MD;  Location: [facility];  Service: Cardiovascular;  Laterality: N/A;   TEE WITHOUT CARDIOVERSION N/A 11/06/2020   Procedure: TRANSESOPHAGEAL ECHOCARDIOGRAM (TEE);  Surgeon: Sherren Mocha, MD;  Location: [facility];  Service: Cardiovascular;  Laterality: N/A;   TONSILLECTOMY     age 57   TOTAL KNEE ARTHROPLASTY Left 06/02/2021   Procedure: LEFT TOTAL KNEE ARTHROPLASTY;  Surgeon: Renette Butters, MD;  Location: [facility];  Service: Orthopedics;   Laterality: Left;   TRANSTHORACIC ECHOCARDIOGRAM  07/03/2020; 10/28/20; 12/03/20   06/2020 TTE EF 60-65%, grd II DD, severe pulm art HTN, severe mitral valve regurg, mod/sev tricuspic valve regurg. Conf on TEE 08/2020.  10/28/20 (s/p MV clip procedure) mild-mod MVR, EF 55-60%. 11/2020 EF 60%, mild/mod MR w/mean grad 64m hg and mod to sev TR.    Current Medications: Current Meds  Medication Sig   Ascorbic Acid (VITAMIN C PO) Take 1,000 mg by mouth in the morning.   azelastine (ASTELIN) 0.1 % nasal spray Place 2 sprays into both nostrils 2 (two) times daily.   cholecalciferol (VITAMIN D) 25 MCG (1000 UNIT) tablet Take 1,000 Units by mouth in the morning.   furosemide (LASIX) 20 MG tablet Take 1 tablet (20 mg total) by mouth daily. (Patient taking differently: Take 20 mg by mouth daily as needed (fluid retention).)   gabapentin (NEURONTIN) 300 MG capsule Take 300 mg by mouth at bedtime.   ibandronate (BONIVA) 150 MG tablet Take 1 tablet (150 mg total) by mouth every 30 (thirty) days. Take in the morning with a full glass of water, on an empty stomach, and do not take anything else by mouth or lie down for the next 30 min.   Multiple Vitamin (MULTIVITAMIN WITH MINERALS) TABS tablet Take 1 tablet by mouth in the morning.   Multiple Vitamins-Minerals (PRESERVISION AREDS PO) Take 1 tablet by mouth in the morning and at bedtime.    nitroGLYCERIN (NITRODUR - DOSED IN MG/24 HR) 0.2 mg/hr patch 1/2 patch over L Achilles tendon bid (Patient taking differently: Place 1 patch onto the skin daily. over L Achilles tendon bid)   pantoprazole (PROTONIX) 40 MG tablet Take 1 tablet (40 mg total) by mouth 2 (two) times daily.   potassium chloride (KLOR-CON) 10 MEQ tablet Take 10 mEq by mouth daily as needed (with furosemide (fluid retention)).   sodium chloride (MURO 128) 5 % ophthalmic solution Place 1 drop into both eyes at bedtime as needed (dry/irritated eyes.).   vitamin E 180 MG (400 UNITS) capsule Take 400 Units by  mouth in the morning.   [DISCONTINUED] carvedilol (COREG) 25 MG tablet Take 1 tablet (25 mg total) by mouth daily.     Allergies:   Barbiturates, Demerol, Doxycycline, Hydrochlorothiazide, Irbesartan, Losartan potassium, Olmesartan medoxomil, and Telmisartan   Social History   Socioeconomic History   Marital status: Married    Spouse name: Not on file   Number of children: Not on file   Years of education: Not on file   Highest education level: Not on file  Occupational History   Not on file  Tobacco Use   Smoking status: Never   Smokeless tobacco: Never  Vaping Use   Vaping Use: Never used  Substance and Sexual Activity   Alcohol use: No   Drug use: Never   Sexual activity: Not Currently  Other Topics Concern   Not  on file  Social History Narrative   Married, 6 children, about 15 GC.  8 [place]   Former Network engineer at [employer].  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   No tob.   No alc.   Social Determinants of Health   Financial Resource Strain: Low Risk  (07/19/2021)   Overall Financial Resource Strain (CARDIA)    Difficulty of Paying Living Expenses: Not hard at all  Food Insecurity: No Food Insecurity (07/19/2021)   Hunger Vital Sign    Worried About Running Out of Food in the Last Year: Never true    Ran Out of Food in the Last Year: Never true  Transportation Needs: No Transportation Needs (07/19/2021)   PRAPARE - Hydrologist (Medical): No    Lack of Transportation (Non-Medical): No  Physical Activity: Sufficiently Active (07/19/2021)   Exercise Vital Sign    Days of Exercise per Week: 7 days    Minutes of Exercise per Session: 30 min  Stress: No Stress Concern Present (07/19/2021)   [place]    Feeling of Stress : Not at all  Social Connections: [place] (07/19/2021)   Social Connection and Isolation Panel [NHANES]    Frequency of  Communication with Friends and Family: More than three times a week    Frequency of Social Gatherings with Friends and Family: More than three times a week    Attends Religious Services: More than 4 times per year    Active Member of [employer] or Organizations: Yes    Attends Music therapist: More than 4 times per year    Marital Status: Married     Family History: The patient's family history includes Cancer in her father; Early death in her father; Hypertension in her mother and another family member; Liver cancer in her father. There is no history of Colon cancer or Rectal cancer.  ROS:   Please see the history of present illness.    All other systems reviewed and are negative.  EKGs/Labs/Other Studies Reviewed:    The following studies were reviewed today: Echo 05/25/2022:  1. Left ventricular ejection fraction, by estimation, is 60 to 65%. The  left ventricle has normal function. The left ventricle has no regional  wall motion abnormalities. There is mild concentric left ventricular  hypertrophy. Left ventricular diastolic  parameters are indeterminate.   2. Right ventricular systolic function is normal. The right ventricular  size is normal. There is moderately elevated pulmonary artery systolic  pressure.   3. Left atrial size was severely dilated.   4. Left to right flow noted at the site of interatrial septum  instrumentation. Evidence of atrial level shunting detected by color flow  Doppler.   5. Right atrial size was mildly dilated.   6. Mitra-Clip in the A2-P2 position. Moderate to severe mitral  regurgitation lateral to the clip is unchanged from prior. Mean mitral  inflow gradient has increased from 5 mmHg on 10/2021 to 7 mmHg now. The  mitral valve has been repaired/replaced.  Moderate to severe mitral valve regurgitation. Mild to moderate mitral  stenosis. The mean mitral valve gradient is 7.0 mmHg. There is a  Mitra-Clip present in the mitral position.    7. Tricuspid valve regurgitation is severe.   8. The aortic valve is tricuspid. Aortic valve regurgitation is mild. No  aortic stenosis is present.   9. The  inferior vena cava is dilated in size with >50% respiratory  variability, suggesting right atrial pressure of 8 mmHg.    Recent Labs: 07/22/2021: ALT 6 06/07/2022: BUN 15; Creatinine, Ser 0.68; Hemoglobin 11.9; Platelets 261; Potassium 4.2; Sodium 146  Recent Lipid Panel    Component Value Date/Time   CHOL 139 07/02/2020 1057   TRIG 30.0 07/02/2020 1057   TRIG 33 09/21/2006 1115   HDL 80.30 07/02/2020 1057   CHOLHDL 2 07/02/2020 1057   VLDL 6.0 07/02/2020 1057   LDLCALC 53 07/02/2020 1057     Risk Assessment/Calculations:           Physical Exam:    VS:  BP (!) 146/80   Pulse 72   Ht '5\' 3"'$  (1.6 m)   Wt 118 lb 9.6 oz (53.8 kg)   SpO2 96%   BMI 21.01 kg/m     Wt Readings from Last 3 Encounters:  06/02/22 118 lb 9.6 oz (53.8 kg)  05/03/22 113 lb 6.4 oz (51.4 kg)  04/21/22 115 lb 9.6 oz (52.4 kg)     GEN:  Well nourished, well developed  pleasant elderly woman in no acute distress HEENT: Normal NECK: No JVD; No carotid bruits LYMPHATICS: No lymphadenopathy CARDIAC: RRR, 3/6 holosystolic murmur at the apex.  RESPIRATORY:  Clear to auscultation without rales, wheezing or rhonchi  ABDOMEN: Soft, non-tender, non-distended MUSCULOSKELETAL:  No edema; No deformity  SKIN: Warm and dry NEUROLOGIC:  Alert and oriented x 3 PSYCHIATRIC:  Normal affect   ASSESSMENT:    1. Severe mitral regurgitation   2. Palpitations    PLAN:    In order of problems listed above:  The patient has recurrent severe MR after TEER associated with NYHA 3 symptoms of dyspnea/diastolic heart failure. She appears to have a large jet of MR lateral to the previously implanted MitraClip device with elevated transmitral gradient. Her treatment options may be limited, but I think it is reasonable to order a TEE to better define the anatomy of  her mitral valve and whether a repeat Clip can be performed. Limitations may be related to mitral stenosis risk, but a TEE should better define this risk.  Risks/indication/alternative to TEE is reviewed with the patient and she would like to proceed. I will see her back in follow-up after her case is reviewed in our multidisciplinary valve team meeting. More frequent palpitations noted. At risk for atrial arrhythmias. Will order ZIO monitor to assess further.      Shared Decision Making/Informed Consent The risks [esophageal damage, perforation (1:10,000 risk), bleeding, pharyngeal hematoma as well as other potential complications associated with conscious sedation including aspiration, arrhythmia, respiratory failure and death], benefits (treatment guidance and diagnostic support) and alternatives of a transesophageal echocardiogram were discussed in detail with Ms. Lightcap and she is willing to proceed.     Medication Adjustments/Labs and Tests Ordered: Current medicines are reviewed at length with the patient today.  Concerns regarding medicines are outlined above.  Orders Placed This Encounter  Procedures   LONG TERM MONITOR (3-14 DAYS)   No orders of the defined types were placed in this encounter.   Patient Instructions  Medication Instructions:  Your physician recommends that you continue on your current medications as directed. Please refer to the Current Medication list given to you today.  *If you need a refill on your cardiac medications before your next appointment, please call your pharmacy*   Lab Work: CBC, CMET (7 days prior to TEE) If you have labs (blood  work) drawn today and your tests are completely normal, you will receive your results only by: [place] (if you have MyChart) OR A paper copy in the mail If you have any lab test that is abnormal or we need to change your treatment, we will call you to review the results.   Testing/Procedures: 3 day Zio  monitor  Transesophageal Echocardiogram Your physician has requested that you have a TEE. During a TEE, sound waves are used to create images of your heart. It provides your doctor with information about the size and shape of your heart and how well your heart's chambers and valves are working. In this test, a transducer is attached to the end of a flexible tube that's guided down your throat and into your esophagus (the tube leading from you mouth to your stomach) to get a more detailed image of your heart. You are not awake for the procedure. Please see the instruction sheet given to you today. For further information please visit [email or web address].  Follow-Up: At [facility], you and your health needs are our priority.  As part of our continuing mission to provide you with exceptional heart care, we have created designated Provider Care Teams.  These Care Teams include your primary Cardiologist (physician) and Advanced Practice Providers (APPs -  Physician Assistants and Nurse Practitioners) who all work together to provide you with the care you need, when you need it.  Your next appointment:   1 week after TEE  The format for your next appointment:   In Person  Provider:   Gardiner Sleeper- Long Term Monitor Instructions  Your physician has requested you wear a ZIO patch monitor for 3 days.  This is a single patch monitor. [employer] supplies one patch monitor per enrollment. Additional stickers are not available. Please do not apply patch if you will be having a Nuclear Stress Test,  Echocardiogram, Cardiac CT, MRI, or Chest Xray during the period you would be wearing the  monitor. The patch cannot be worn during these tests. You cannot remove and re-apply the  ZIO XT patch monitor.  Your ZIO patch monitor will be mailed 3 day USPS to your address on file. It may take 3-5 days  to receive your monitor after you have been enrolled.  Once you have received your monitor, please  review the enclosed instructions. Your monitor  has already been registered assigning a specific monitor serial # to you.  Billing and Patient Assistance Program Information  We have supplied [employer] with any of your insurance information on file for billing purposes. [employer] offers a sliding scale Patient Assistance Program for patients that do not have  insurance, or whose insurance does not completely cover the cost of the ZIO monitor.  You must apply for the Patient Assistance Program to qualify for this discounted rate.  To apply, please call [employer] at [phone], select option 4, select option 2, ask to apply for  Patient Assistance Program. Theodore Demark will ask your household income, and how many people  are in your household. They will quote your out-of-pocket cost based on that information.  [employer] will also be able to set up a 17-month interest-free payment plan if needed.  Applying the monitor   Shave hair from upper left chest.  Hold abrader disc by orange tab. Rub abrader in 40 strokes over the upper left chest as  indicated in your monitor instructions.  Clean area with 4 enclosed alcohol pads. Let dry.  Apply patch  as indicated in monitor instructions. Patch will be placed under collarbone on left  side of chest with arrow pointing upward.  Rub patch adhesive wings for 2 minutes. Remove white label marked "1". Remove the white  label marked "2". Rub patch adhesive wings for 2 additional minutes.  While looking in a mirror, press and release button in center of patch. A small green light will  flash 3-4 times. This will be your only indicator that the monitor has been turned on.  Do not shower for the first 24 hours. You may shower after the first 24 hours.  Press the button if you feel a symptom. You will hear a small click. Record Date, Time and  Symptom in the Patient Logbook.  When you are ready to remove the patch, follow instructions on the last 2 pages of Patient   Logbook. Stick patch monitor onto the last page of Patient Logbook.  Place Patient Logbook in the blue and white box. Use locking tab on box and tape box closed  securely. The blue and white box has prepaid postage on it. Please place it in the mailbox as  soon as possible. Your physician should have your test results approximately 7 days after the  monitor has been mailed back to [facility].  Call [place] at [phone] if you have questions regarding  your ZIO XT patch monitor. Call them immediately if you see an orange light blinking on your  monitor.  If your monitor falls off in less than 4 days, contact our Monitor department at [phone].  If your monitor becomes loose or falls off after 4 days call [employer] at [phone] for  suggestions on securing your monitor         Signed, Sherren Mocha, MD  06/09/2022 11:11 PM    [place]

## 2022-06-02 NOTE — H&P (View-Only) (Signed)
Cardiology Office Note:    Date:  06/09/2022   ID:  SHALINI MAIR, DOB [birth year], MRN [id number]  PCP:  Tammi Sou, MD   [place] Prov[facility] Cardiologist:  Sinclair Grooms, MD     Referring MD: Tammi Sou, MD   Chief Complaint  Patient presents with   Shortness of Breath    History of Present Illness:    Erin [name] is a 87 y.o. female with a hx of severe mitral regurgitation status post Teer with MitraClip January 2022.  The patient initially had an acceptable result with mild to moderate residual mitral regurgitation and did well clinically for several months, but earlier this year was noted to have progressive mitral regurgitation now back to the moderately severe to severe range associated with worsening shortness of breath with activity.  She returns today for further discussion after recent echocardiogram showed severe mitral and tricuspid regurgitation as well as increased transmitral gradients up to 7 mmHg mean.  The patient continues to experience exertional dyspnea with daily activities. She also complains of fatigue. She denies chest pain, orthopnea, or PND. No leg edema. Reports worsening heart palpitations. Symptoms have not improved with medical therapy to date. She would like to consider further treatment options if there is an opportunity to improve her symptoms and QOL.  Past Medical History:  Diagnosis Date   Achilles tendon rupture    left, no surgery required   Chronic diastolic heart failure ([place])    Gross hematuria 12/2020   Klebsiella on urine clx x2 but no UTI sx's.  CT showed tiny stones in each renal pelvis but no ureteral or bladder stones, no mass.  Urol did cysto->normal. Plan is annual UA, rpt hematuria w/u in 3-5 yrs if persistent pos.   History of kidney stones    Hypertension    Hypertensive retinopathy of both eyes    Dr. Herbert Deaner   Low back pain    spondylosis, listhesis, +scoliosis at L/S jxn   Lower extremity  edema    lasix prn   OA (osteoarthritis)    Osteopenia    Osteoporosis 07/2021   07/2021 T score -4.6   S/P mitral valve clip implantation 11/06/2020   s/p TEER with one XTW MitraClip on A2P2 by Dr. Burt Knack.  DAPT w/ASA and plavix x 3 mo post procedure recommended   Severe mitral regurgitation    2021 transthoracic echo and TEE->cardiology following.  Mitral valve clip 10/2020.  f/u echo 10/28/20 mild/mod MVR and tricusp regurg   Severe pulmonary hypertension ([place]) 2021   transth echo; moderate by TEE 43/3295   Systolic murmur 18/84/1660   severe mitral regurge, mod/severe tricuspic regurg   Tremor of right hand     Past Surgical History:  Procedure Laterality Date   APPENDECTOMY     87 yrs old   [place]  2004   ruptured disc   CHOLECYSTECTOMY     age 65   CYSTOSCOPY  02/24/2021   (for gross hematuria) ->normal.   DEXA  07/2021   2022 T score -4.6 (radius)   LUMBAR LAMINECTOMY  2004   MITRAL VALVE REPAIR N/A 11/06/2020   Procedure: MITRAL VALVE REPAIR;  Surgeon: Sherren Mocha, MD;  Location: [place] [facility];  Service: Cardiovascular;  Laterality: N/A;   RIGHT/LEFT HEART CATH AND CORONARY ANGIOGRAPHY N/A 09/16/2020   No CAD. Procedure: RIGHT/LEFT HEART CATH AND CORONARY ANGIOGRAPHY;  Surgeon: Belva Crome, MD;  Location: [place] [facility];  Service: Cardiovascular;  Laterality: N/A;   TEE WITHOUT CARDIOVERSION N/A 09/16/2020   Procedure: TRANSESOPHAGEAL ECHOCARDIOGRAM (TEE);  Surgeon: Buford Dresser, MD;  Location: [facility];  Service: Cardiovascular;  Laterality: N/A;   TEE WITHOUT CARDIOVERSION N/A 11/06/2020   Procedure: TRANSESOPHAGEAL ECHOCARDIOGRAM (TEE);  Surgeon: Sherren Mocha, MD;  Location: [place] [facility];  Service: Cardiovascular;  Laterality: N/A;   TONSILLECTOMY     age 4   TOTAL KNEE ARTHROPLASTY Left 06/02/2021   Procedure: LEFT TOTAL KNEE ARTHROPLASTY;  Surgeon: Renette Butters, MD;  Location: [facility];  Service: Orthopedics;   Laterality: Left;   TRANSTHORACIC ECHOCARDIOGRAM  07/03/2020; 10/28/20; 12/03/20   06/2020 TTE EF 60-65%, grd II DD, severe pulm art HTN, severe mitral valve regurg, mod/sev tricuspic valve regurg. Conf on TEE 08/2020.  10/28/20 (s/p MV clip procedure) mild-mod MVR, EF 55-60%. 11/2020 EF 60%, mild/mod MR w/mean grad 33m hg and mod to sev TR.    Current Medications: Current Meds  Medication Sig   Ascorbic Acid (VITAMIN C PO) Take 1,000 mg by mouth in the morning.   azelastine (ASTELIN) 0.1 % nasal spray Place 2 sprays into both nostrils 2 (two) times daily.   cholecalciferol (VITAMIN D) 25 MCG (1000 UNIT) tablet Take 1,000 Units by mouth in the morning.   furosemide (LASIX) 20 MG tablet Take 1 tablet (20 mg total) by mouth daily. (Patient taking differently: Take 20 mg by mouth daily as needed (fluid retention).)   gabapentin (NEURONTIN) 300 MG capsule Take 300 mg by mouth at bedtime.   ibandronate (BONIVA) 150 MG tablet Take 1 tablet (150 mg total) by mouth every 30 (thirty) days. Take in the morning with a full glass of water, on an empty stomach, and do not take anything else by mouth or lie down for the next 30 min.   Multiple Vitamin (MULTIVITAMIN WITH MINERALS) TABS tablet Take 1 tablet by mouth in the morning.   Multiple Vitamins-Minerals (PRESERVISION AREDS PO) Take 1 tablet by mouth in the morning and at bedtime.    nitroGLYCERIN (NITRODUR - DOSED IN MG/24 HR) 0.2 mg/hr patch 1/2 patch over L Achilles tendon bid (Patient taking differently: Place 1 patch onto the skin daily. over L Achilles tendon bid)   pantoprazole (PROTONIX) 40 MG tablet Take 1 tablet (40 mg total) by mouth 2 (two) times daily.   potassium chloride (KLOR-CON) 10 MEQ tablet Take 10 mEq by mouth daily as needed (with furosemide (fluid retention)).   sodium chloride (MURO 128) 5 % ophthalmic solution Place 1 drop into both eyes at bedtime as needed (dry/irritated eyes.).   vitamin E 180 MG (400 UNITS) capsule Take 400 Units by  mouth in the morning.   [DISCONTINUED] carvedilol (COREG) 25 MG tablet Take 1 tablet (25 mg total) by mouth daily.     Allergies:   Barbiturates, Demerol, Doxycycline, Hydrochlorothiazide, Irbesartan, Losartan potassium, Olmesartan medoxomil, and Telmisartan   Social History   Socioeconomic History   Marital status: Married    Spouse name: Not on file   Number of children: Not on file   Years of education: Not on file   Highest education level: Not on file  Occupational History   Not on file  Tobacco Use   Smoking status: Never   Smokeless tobacco: Never  Vaping Use   Vaping Use: Never used  Substance and Sexual Activity   Alcohol use: No   Drug use: Never   Sexual activity: Not Currently  Other Topics Concern   Not  on file  Social History Narrative   Married, 6 children, about 15 GC.  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   No tob.   No alc.   Social Determinants of Health   Financial Resource Strain: Low Risk  (07/19/2021)   Overall Financial Resource Strain (CARDIA)    Difficulty of Paying Living Expenses: Not hard at all  Food Insecurity: No Food Insecurity (07/19/2021)   Hunger Vital Sign    Worried About Running Out of Food in the Last Year: Never true    Ran Out of Food in the Last Year: Never true  Transportation Needs: No Transportation Needs (07/19/2021)   PRAPARE - Hydrologist (Medical): No    Lack of Transportation (Non-Medical): No  Physical Activity: Sufficiently Active (07/19/2021)   Exercise Vital Sign    Days of Exercise per Week: 7 days    Minutes of Exercise per Session: 30 min  Stress: No Stress Concern Present (07/19/2021)   [place]    Feeling of Stress : Not at all  Social Connections: [place] (07/19/2021)   Social Connection and Isolation Panel [NHANES]    Frequency of  Communication with Friends and Family: More than three times a week    Frequency of Social Gatherings with Friends and Family: More than three times a week    Attends Religious Services: More than 4 times per year    Active Member of [employer] or Organizations: Yes    Attends Music therapist: More than 4 times per year    Marital Status: Married     Family History: The patient's family history includes Cancer in her father; Early death in her father; Hypertension in her mother and another family member; Liver cancer in her father. There is no history of Colon cancer or Rectal cancer.  ROS:   Please see the history of present illness.    All other systems reviewed and are negative.  EKGs/Labs/Other Studies Reviewed:    The following studies were reviewed today: Echo 05/25/2022:  1. Left ventricular ejection fraction, by estimation, is 60 to 65%. The  left ventricle has normal function. The left ventricle has no regional  wall motion abnormalities. There is mild concentric left ventricular  hypertrophy. Left ventricular diastolic  parameters are indeterminate.   2. Right ventricular systolic function is normal. The right ventricular  size is normal. There is moderately elevated pulmonary artery systolic  pressure.   3. Left atrial size was severely dilated.   4. Left to right flow noted at the site of interatrial septum  instrumentation. Evidence of atrial level shunting detected by color flow  Doppler.   5. Right atrial size was mildly dilated.   6. Mitra-Clip in the A2-P2 position. Moderate to severe mitral  regurgitation lateral to the clip is unchanged from prior. Mean mitral  inflow gradient has increased from 5 mmHg on 10/2021 to 7 mmHg now. The  mitral valve has been repaired/replaced.  Moderate to severe mitral valve regurgitation. Mild to moderate mitral  stenosis. The mean mitral valve gradient is 7.0 mmHg. There is a  Mitra-Clip present in the mitral position.    7. Tricuspid valve regurgitation is severe.   8. The aortic valve is tricuspid. Aortic valve regurgitation is mild. No  aortic stenosis is present.   9. The  inferior vena cava is dilated in size with >50% respiratory  variability, suggesting right atrial pressure of 8 mmHg.    Recent Labs: 07/22/2021: ALT 6 06/07/2022: BUN 15; Creatinine, Ser 0.68; Hemoglobin 11.9; Platelets 261; Potassium 4.2; Sodium 146  Recent Lipid Panel    Component Value Date/Time   CHOL 139 07/02/2020 1057   TRIG 30.0 07/02/2020 1057   TRIG 33 09/21/2006 1115   HDL 80.30 07/02/2020 1057   CHOLHDL 2 07/02/2020 1057   VLDL 6.0 07/02/2020 1057   LDLCALC 53 07/02/2020 1057     Risk Assessment/Calculations:           Physical Exam:    VS:  BP (!) 146/80   Pulse 72   Ht '5\' 3"'$  (1.6 m)   Wt 118 lb 9.6 oz (53.8 kg)   SpO2 96%   BMI 21.01 kg/m     Wt Readings from Last 3 Encounters:  06/02/22 118 lb 9.6 oz (53.8 kg)  05/03/22 113 lb 6.4 oz (51.4 kg)  04/21/22 115 lb 9.6 oz (52.4 kg)     GEN:  Well nourished, well developed  pleasant elderly woman in no acute distress HEENT: Normal NECK: No JVD; No carotid bruits LYMPHATICS: No lymphadenopathy CARDIAC: RRR, 3/6 holosystolic murmur at the apex.  RESPIRATORY:  Clear to auscultation without rales, wheezing or rhonchi  ABDOMEN: Soft, non-tender, non-distended MUSCULOSKELETAL:  No edema; No deformity  SKIN: Warm and dry NEUROLOGIC:  Alert and oriented x 3 PSYCHIATRIC:  Normal affect   ASSESSMENT:    1. Severe mitral regurgitation   2. Palpitations    PLAN:    In order of problems listed above:  The patient has recurrent severe MR after TEER associated with NYHA 3 symptoms of dyspnea/diastolic heart failure. She appears to have a large jet of MR lateral to the previously implanted MitraClip device with elevated transmitral gradient. Her treatment options may be limited, but I think it is reasonable to order a TEE to better define the anatomy of  her mitral valve and whether a repeat Clip can be performed. Limitations may be related to mitral stenosis risk, but a TEE should better define this risk.  Risks/indication/alternative to TEE is reviewed with the patient and she would like to proceed. I will see her back in follow-up after her case is reviewed in our multidisciplinary valve team meeting. More frequent palpitations noted. At risk for atrial arrhythmias. Will order ZIO monitor to assess further.      Shared Decision Making/Informed Consent The risks [esophageal damage, perforation (1:10,000 risk), bleeding, pharyngeal hematoma as well as other potential complications associated with conscious sedation including aspiration, arrhythmia, respiratory failure and death], benefits (treatment guidance and diagnostic support) and alternatives of a transesophageal echocardiogram were discussed in detail with Erin [name] and she is willing to proceed.     Medication Adjustments/Labs and Tests Ordered: Current medicines are reviewed at length with the patient today.  Concerns regarding medicines are outlined above.  Orders Placed This Encounter  Procedures   LONG TERM MONITOR (3-14 DAYS)   No orders of the defined types were placed in this encounter.   Patient Instructions  Medication Instructions:  Your physician recommends that you continue on your current medications as directed. Please refer to the Current Medication list given to you today.  *If you need a refill on your cardiac medications before your next appointment, please call your pharmacy*   Lab Work: CBC, CMET (7 days prior to TEE) If you have labs (blood  work) drawn today and your tests are completely normal, you will receive your results only by: [place] (if you have MyChart) OR A paper copy in the mail If you have any lab test that is abnormal or we need to change your treatment, we will call you to review the results.   Testing/Procedures: 3 day Zio  monitor  Transesophageal Echocardiogram Your physician has requested that you have a TEE. During a TEE, sound waves are used to create images of your heart. It provides your doctor with information about the size and shape of your heart and how well your heart's chambers and valves are working. In this test, a transducer is attached to the end of a flexible tube that's guided down your throat and into your esophagus (the tube leading from you mouth to your stomach) to get a more detailed image of your heart. You are not awake for the procedure. Please see the instruction sheet given to you today. For further information please visit [email or web address].  Follow-Up: At [facility], you and your health needs are our priority.  As part of our continuing mission to provide you with exceptional heart care, we have created designated Provider Care Teams.  These Care Teams include your primary Cardiologist (physician) and Advanced Practice Providers (APPs -  Physician Assistants and Nurse Practitioners) who all work together to provide you with the care you need, when you need it.  Your next appointment:   1 week after TEE  The format for your next appointment:   In Person  Provider:   Gardiner Sleeper- Long Term Monitor Instructions  Your physician has requested you wear a ZIO patch monitor for 3 days.  This is a single patch monitor. [employer] supplies one patch monitor per enrollment. Additional stickers are not available. Please do not apply patch if you will be having a Nuclear Stress Test,  Echocardiogram, Cardiac CT, MRI, or Chest Xray during the period you would be wearing the  monitor. The patch cannot be worn during these tests. You cannot remove and re-apply the  ZIO XT patch monitor.  Your ZIO patch monitor will be mailed 3 day USPS to your address on file. It may take 3-5 days  to receive your monitor after you have been enrolled.  Once you have received your monitor, please  review the enclosed instructions. Your monitor  has already been registered assigning a specific monitor serial # to you.  Billing and Patient Assistance Program Information  We have supplied [employer] with any of your insurance information on file for billing purposes. [employer] offers a sliding scale Patient Assistance Program for patients that do not have  insurance, or whose insurance does not completely cover the cost of the ZIO monitor.  You must apply for the Patient Assistance Program to qualify for this discounted rate.  To apply, please call [employer] at [phone], select option 4, select option 2, ask to apply for  Patient Assistance Program. Theodore Demark will ask your household income, and how many people  are in your household. They will quote your out-of-pocket cost based on that information.  [employer] will also be able to set up a 56-month interest-free payment plan if needed.  Applying the monitor   Shave hair from upper left chest.  Hold abrader disc by orange tab. Rub abrader in 40 strokes over the upper left chest as  indicated in your monitor instructions.  Clean area with 4 enclosed alcohol pads. Let dry.  Apply patch  as indicated in monitor instructions. Patch will be placed under collarbone on left  side of chest with arrow pointing upward.  Rub patch adhesive wings for 2 minutes. Remove white label marked "1". Remove the white  label marked "2". Rub patch adhesive wings for 2 additional minutes.  While looking in a mirror, press and release button in center of patch. A small green light will  flash 3-4 times. This will be your only indicator that the monitor has been turned on.  Do not shower for the first 24 hours. You may shower after the first 24 hours.  Press the button if you feel a symptom. You will hear a small click. Record Date, Time and  Symptom in the Patient Logbook.  When you are ready to remove the patch, follow instructions on the last 2 pages of Patient   Logbook. Stick patch monitor onto the last page of Patient Logbook.  Place Patient Logbook in the blue and white box. Use locking tab on box and tape box closed  securely. The blue and white box has prepaid postage on it. Please place it in the mailbox as  soon as possible. Your physician should have your test results approximately 7 days after the  monitor has been mailed back to [facility].  Call [place] at [phone] if you have questions regarding  your ZIO XT patch monitor. Call them immediately if you see an orange light blinking on your  monitor.  If your monitor falls off in less than 4 days, contact our Monitor department at [phone].  If your monitor becomes loose or falls off after 4 days call [employer] at [phone] for  suggestions on securing your monitor         Signed, Sherren Mocha, MD  06/09/2022 11:11 PM    [place]

## 2022-06-02 NOTE — Patient Instructions (Addendum)
Medication Instructions:  Your physician recommends that you continue on your current medications as directed. Please refer to the Current Medication list given to you today.  *If you need a refill on your cardiac medications before your next appointment, please call your pharmacy*   Lab Work: CBC, CMET (7 days prior to TEE) If you have labs (blood work) drawn today and your tests are completely normal, you will receive your results only by: [place] (if you have MyChart) OR A paper copy in the mail If you have any lab test that is abnormal or we need to change your treatment, we will call you to review the results.   Testing/Procedures: 3 day Zio monitor  Transesophageal Echocardiogram Your physician has requested that you have a TEE. During a TEE, sound waves are used to create images of your heart. It provides your doctor with information about the size and shape of your heart and how well your heart's chambers and valves are working. In this test, a transducer is attached to the end of a flexible tube that's guided down your throat and into your esophagus (the tube leading from you mouth to your stomach) to get a more detailed image of your heart. You are not awake for the procedure. Please see the instruction sheet given to you today. For further information please visit [email or web address].  Follow-Up: At [facility], you and your health needs are our priority.  As part of our continuing mission to provide you with exceptional heart care, we have created designated Provider Care Teams.  These Care Teams include your primary Cardiologist (physician) and Advanced Practice Providers (APPs -  Physician Assistants and Nurse Practitioners) who all work together to provide you with the care you need, when you need it.  Your next appointment:   1 week after TEE  The format for your next appointment:   In Person  Provider:   Gardiner Sleeper- Long Term Monitor  Instructions  Your physician has requested you wear a ZIO patch monitor for 3 days.  This is a single patch monitor. [employer] supplies one patch monitor per enrollment. Additional stickers are not available. Please do not apply patch if you will be having a Nuclear Stress Test,  Echocardiogram, Cardiac CT, MRI, or Chest Xray during the period you would be wearing the  monitor. The patch cannot be worn during these tests. You cannot remove and re-apply the  ZIO XT patch monitor.  Your ZIO patch monitor will be mailed 3 day USPS to your address on file. It may take 3-5 days  to receive your monitor after you have been enrolled.  Once you have received your monitor, please review the enclosed instructions. Your monitor  has already been registered assigning a specific monitor serial # to you.  Billing and Patient Assistance Program Information  We have supplied [employer] with any of your insurance information on file for billing purposes. [employer] offers a sliding scale Patient Assistance Program for patients that do not have  insurance, or whose insurance does not completely cover the cost of the ZIO monitor.  You must apply for the Patient Assistance Program to qualify for this discounted rate.  To apply, please call [employer] at [phone], select option 4, select option 2, ask to apply for  Patient Assistance Program. Theodore Demark will ask your household income, and how many people  are in your household. They will quote your out-of-pocket cost based on that information.  [employer] will also be able  to set up a 34-month interest-free payment plan if needed.  Applying the monitor   Shave hair from upper left chest.  Hold abrader disc by orange tab. Rub abrader in 40 strokes over the upper left chest as  indicated in your monitor instructions.  Clean area with 4 enclosed alcohol pads. Let dry.  Apply patch as indicated in monitor instructions. Patch will be placed under collarbone on left  side  of chest with arrow pointing upward.  Rub patch adhesive wings for 2 minutes. Remove white label marked "1". Remove the white  label marked "2". Rub patch adhesive wings for 2 additional minutes.  While looking in a mirror, press and release button in center of patch. A small green light will  flash 3-4 times. This will be your only indicator that the monitor has been turned on.  Do not shower for the first 24 hours. You may shower after the first 24 hours.  Press the button if you feel a symptom. You will hear a small click. Record Date, Time and  Symptom in the Patient Logbook.  When you are ready to remove the patch, follow instructions on the last 2 pages of Patient  Logbook. Stick patch monitor onto the last page of Patient Logbook.  Place Patient Logbook in the blue and white box. Use locking tab on box and tape box closed  securely. The blue and white box has prepaid postage on it. Please place it in the mailbox as  soon as possible. Your physician should have your test results approximately 7 days after the  monitor [facility]  Call [place] [phone] you have questions regarding  your ZIO XT patch monitor. Call them immediately if you see an orange light blinking on your  monitor.  If your monitor falls off in less than 4 days, contact our Monitor department at [phone]  If your monitor becomes loose or falls off after 4 days call [employer] at [phone]  suggestions on securing your monitor

## 2022-06-03 ENCOUNTER — Ambulatory Visit (INDEPENDENT_AMBULATORY_CARE_PROVIDER_SITE_OTHER): Payer: Medicare Other

## 2022-06-03 DIAGNOSIS — R002 Palpitations: Secondary | ICD-10-CM

## 2022-06-03 DIAGNOSIS — I340 Nonrheumatic mitral (valve) insufficiency: Secondary | ICD-10-CM

## 2022-06-03 NOTE — Progress Notes (Unsigned)
Enrolled for [employer] to mail a ZIO XT long term holter monitor to the patients address on file.  

## 2022-06-04 ENCOUNTER — Other Ambulatory Visit: Payer: Self-pay | Admitting: Cardiovascular Disease

## 2022-06-04 DIAGNOSIS — Z01810 Encounter for preprocedural cardiovascular examination: Secondary | ICD-10-CM

## 2022-06-04 NOTE — Progress Notes (Signed)
Lab orders placed for upcoming TEE.

## 2022-06-07 ENCOUNTER — Other Ambulatory Visit: Payer: Medicare Other

## 2022-06-07 ENCOUNTER — Telehealth: Payer: Self-pay | Admitting: Cardiovascular Disease

## 2022-06-07 DIAGNOSIS — Z01810 Encounter for preprocedural cardiovascular examination: Secondary | ICD-10-CM

## 2022-06-07 NOTE — Telephone Encounter (Signed)
Patient called stating that she has not received her monitor as yet.  Patient stated that she is concerned if she receives it today should she still attach it as she has a TEE procedure on Wednesday or when should she start wearing the monitor.

## 2022-06-07 NOTE — Telephone Encounter (Signed)
Spoke with [facility] who looked up zio tracking. Delivered at 1:41 today (left at side door). Returned call to patient who states that she does now have it. Advised her that it would be okay to go ahead and put it on, but if she would prefer to wait until after her procedure (TEE), that's fine too. She states she will decide later, but will call if she has questions.

## 2022-06-08 LAB — CBC
Hematocrit: 35.4 % (ref 34.0–46.6)
Hemoglobin: 11.9 g/dL (ref 11.1–15.9)
MCH: 28.3 pg (ref 26.6–33.0)
MCHC: 33.6 g/dL (ref 31.5–35.7)
MCV: 84 fL (ref 79–97)
Platelets: 261 10*3/uL (ref 150–450)
RBC: 4.21 x10E6/uL (ref 3.77–5.28)
RDW: 15.3 % (ref 11.7–15.4)
WBC: 6.9 10*3/uL (ref 3.4–10.8)

## 2022-06-08 LAB — BASIC METABOLIC PANEL
BUN/Creatinine Ratio: 22 (ref 12–28)
BUN: 15 mg/dL (ref 8–27)
CO2: 23 mmol/L (ref 20–29)
Calcium: 8.5 mg/dL — ABNORMAL LOW (ref 8.7–10.3)
Chloride: 107 mmol/L — ABNORMAL HIGH (ref 96–106)
Creatinine, Ser: 0.68 mg/dL (ref 0.57–1.00)
Glucose: 94 mg/dL (ref 70–99)
Potassium: 4.2 mmol/L (ref 3.5–5.2)
Sodium: 146 mmol/L — ABNORMAL HIGH (ref 134–144)
eGFR: 85 mL/min/{1.73_m2} (ref >59)

## 2022-06-09 ENCOUNTER — Other Ambulatory Visit (HOSPITAL_COMMUNITY): Payer: Self-pay | Admitting: Orthopedic Surgery

## 2022-06-09 DIAGNOSIS — M170 Bilateral primary osteoarthritis of knee: Secondary | ICD-10-CM | POA: Diagnosis not present

## 2022-06-09 DIAGNOSIS — T84033A Mechanical loosening of internal left knee prosthetic joint, initial encounter: Secondary | ICD-10-CM

## 2022-06-10 ENCOUNTER — Encounter (HOSPITAL_COMMUNITY): Payer: Self-pay | Admitting: Cardiovascular Disease

## 2022-06-10 ENCOUNTER — Telehealth: Payer: Self-pay | Admitting: Cardiovascular Disease

## 2022-06-10 DIAGNOSIS — H3562 Retinal hemorrhage, left eye: Secondary | ICD-10-CM | POA: Diagnosis not present

## 2022-06-10 DIAGNOSIS — H40013 Open angle with borderline findings, low risk, bilateral: Secondary | ICD-10-CM | POA: Diagnosis not present

## 2022-06-10 NOTE — Telephone Encounter (Signed)
Patient is having a bone scan done on her knee with dye the day before her procedure.  She wants to know if okay do have it done prior to her procedure with [place]. Please advise.

## 2022-06-10 NOTE — Telephone Encounter (Signed)
Informed patient that this should be fine since there's no contrast in TEE procedure (although potentially will get definity). Pt understands.

## 2022-06-15 ENCOUNTER — Encounter (HOSPITAL_COMMUNITY): Payer: Self-pay

## 2022-06-15 ENCOUNTER — Encounter (HOSPITAL_COMMUNITY): Payer: Medicare Other

## 2022-06-16 ENCOUNTER — Encounter (HOSPITAL_COMMUNITY): Payer: Self-pay | Admitting: Cardiovascular Disease

## 2022-06-16 ENCOUNTER — Ambulatory Visit (HOSPITAL_BASED_OUTPATIENT_CLINIC_OR_DEPARTMENT_OTHER)
Admission: RE | Admit: 2022-06-16 | Discharge: 2022-06-16 | Disposition: A | Discharge status: Home / Self Care | Payer: Medicare Other | Source: Ambulatory Visit | Attending: Cardiovascular Disease | Admitting: Cardiovascular Disease

## 2022-06-16 ENCOUNTER — Ambulatory Visit (HOSPITAL_COMMUNITY)
Admission: RE | Admit: 2022-06-16 | Discharge: 2022-06-16 | Disposition: A | Discharge status: Home / Self Care | Payer: Medicare Other | Source: Ambulatory Visit | Attending: Cardiovascular Disease | Admitting: Cardiovascular Disease

## 2022-06-16 ENCOUNTER — Encounter (HOSPITAL_COMMUNITY)
Admission: RE | Disposition: A | Discharge status: Home / Self Care | Payer: Medicare Other | Source: Ambulatory Visit | Attending: Cardiovascular Disease

## 2022-06-16 ENCOUNTER — Other Ambulatory Visit: Payer: Self-pay

## 2022-06-16 ENCOUNTER — Ambulatory Visit (HOSPITAL_COMMUNITY): Payer: Medicare Other | Admitting: Anesthesiology

## 2022-06-16 ENCOUNTER — Ambulatory Visit (HOSPITAL_BASED_OUTPATIENT_CLINIC_OR_DEPARTMENT_OTHER): Payer: Medicare Other | Admitting: Anesthesiology

## 2022-06-16 DIAGNOSIS — Q2111 Secundum atrial septal defect: Secondary | ICD-10-CM | POA: Insufficient documentation

## 2022-06-16 DIAGNOSIS — I110 Hypertensive heart disease with heart failure: Secondary | ICD-10-CM | POA: Insufficient documentation

## 2022-06-16 DIAGNOSIS — I10 Essential (primary) hypertension: Secondary | ICD-10-CM

## 2022-06-16 DIAGNOSIS — M1990 Unspecified osteoarthritis, unspecified site: Secondary | ICD-10-CM | POA: Diagnosis not present

## 2022-06-16 DIAGNOSIS — I081 Rheumatic disorders of both mitral and tricuspid valves: Secondary | ICD-10-CM | POA: Insufficient documentation

## 2022-06-16 DIAGNOSIS — R0602 Shortness of breath: Secondary | ICD-10-CM | POA: Insufficient documentation

## 2022-06-16 DIAGNOSIS — I5032 Chronic diastolic (congestive) heart failure: Secondary | ICD-10-CM | POA: Diagnosis not present

## 2022-06-16 DIAGNOSIS — I340 Nonrheumatic mitral (valve) insufficiency: Secondary | ICD-10-CM

## 2022-06-16 DIAGNOSIS — I088 Other rheumatic multiple valve diseases: Secondary | ICD-10-CM | POA: Diagnosis not present

## 2022-06-16 DIAGNOSIS — I272 Pulmonary hypertension, unspecified: Secondary | ICD-10-CM | POA: Diagnosis not present

## 2022-06-16 DIAGNOSIS — R002 Palpitations: Secondary | ICD-10-CM | POA: Diagnosis not present

## 2022-06-16 HISTORY — PX: TEE WITHOUT CARDIOVERSION: SHX5443

## 2022-06-16 LAB — ECHO TEE
Area-P 1/2: 3.2 cm2
MV M vel: 5.19 m/s
MV Peak grad: 107.6 mmHg
MV VTI: 1.04 cm2
Radius: 1.1 cm

## 2022-06-16 SURGERY — ECHOCARDIOGRAM, TRANSESOPHAGEAL
Anesthesia: Monitor Anesthesia Care

## 2022-06-16 MED ORDER — SODIUM CHLORIDE 0.9 % IV SOLN
INTRAVENOUS | Status: AC | PRN
  Administered 2022-06-16: 500 mL via INTRAVENOUS

## 2022-06-16 MED ORDER — SODIUM CHLORIDE 0.9 % IV SOLN: INTRAVENOUS | Status: DC

## 2022-06-16 MED ORDER — PROPOFOL 500 MG/50ML IV EMUL
INTRAVENOUS | Status: DC | PRN
  Administered 2022-06-16: 50 ug/kg/min via INTRAVENOUS

## 2022-06-16 NOTE — Transfer of Care (Signed)
Immediate Anesthesia Transfer of Care Note  Patient: Erin [name]  Procedure(s) Performed: TRANSESOPHAGEAL ECHOCARDIOGRAM (TEE)  Patient Location: PACU and Endoscopy Unit  Anesthesia Type:MAC  Level of Consciousness: awake and patient cooperative  Airway & Oxygen Therapy: Patient Spontanous Breathing and Patient connected to nasal cannula oxygen  Post-op Assessment: Report given to RN and Post -op Vital signs reviewed and stable  Post vital signs: Reviewed and stable  Last Vitals:  Vitals Value Taken Time  BP    Temp    Pulse    Resp    SpO2      Last Pain:  Vitals:   06/16/22 1015  TempSrc: Temporal  PainSc: 0-No pain         Complications: No notable events documented.

## 2022-06-16 NOTE — Discharge Instructions (Signed)

## 2022-06-16 NOTE — Interval H&P Note (Signed)
History and Physical Interval Note:  06/16/2022 10:09 AM  Erin [name]  has presented today for surgery, with the diagnosis of [place].  The various methods of treatment have been discussed with the patient and family. After consideration of risks, benefits and other options for treatment, the patient has consented to  Procedure(s): TRANSESOPHAGEAL ECHOCARDIOGRAM (TEE) (N/A) as a surgical intervention.  The patient's history has been reviewed, patient examined, no change in status, stable for surgery.  I have reviewed the patient's chart and labs.  Questions were answered to the patient's satisfaction.     Jovon Winterhalter

## 2022-06-16 NOTE — Progress Notes (Signed)
  Echocardiogram Echocardiogram Transesophageal has been performed.  Erin [name] 06/16/2022, 11:44 AM

## 2022-06-16 NOTE — Anesthesia Preprocedure Evaluation (Addendum)
Anesthesia Evaluation  Patient identified by MRN, date of birth, ID band  Reviewed: Allergy & Precautions, NPO status , Patient's Chart, lab work & pertinent test results  Airway Mallampati: III  TM Distance: >3 FB Neck ROM: Full    Dental  (+) Dental Advisory Given, Teeth Intact   Pulmonary neg pulmonary ROS,    Pulmonary exam normal breath sounds clear to auscultation       Cardiovascular hypertension, Pt. on medications pulmonary hypertension+ Valvular Problems/Murmurs MR  Rhythm:Regular Rate:Normal + Systolic murmurs Echo 10/5054 1. Left ventricular ejection fraction, by estimation, is 60 to 65%. The left ventricle has normal function. The left ventricle has no regional wall motion abnormalities. There is mild concentric left ventricular hypertrophy. Left ventricular diastolic parameters are indeterminate.  2. Right ventricular systolic function is normal. The right ventricular size is normal. There is moderately elevated pulmonary artery systolic pressure.  3. Left atrial size was severely dilated.  4. Left to right flow noted at the site of interatrial septum  instrumentation. Evidence of atrial level shunting detected by color flow Doppler.  5. Right atrial size was mildly dilated.  6. Mitra-Clip in the A2-P2 position. Moderate to severe mitral regurgitation lateral to the clip is unchanged from prior. Mean mitral inflow gradient has increased from 5 mmHg on 10/2021 to 7 mmHg now. The mitral valve has been repaired/replaced. Moderate to severe mitral valve regurgitation. Mild to moderate mitral stenosis. The mean mitral valve gradient is 7.0 mmHg. There is a Mitra-Clip present in the mitral position.  7. Tricuspid valve regurgitation is severe.  8. The aortic valve is tricuspid. Aortic valve regurgitation is mild. No aortic stenosis is present.  9. The inferior vena cava is dilated in size with >50% respiratory variability,  suggesting right atrial pressure of 8 mmHg.    Echo 10/2021 1. Left ventricular ejection fraction, by estimation, is 60 to 65%. The left ventricle has normal function. The left ventricle has no regional wall motion abnormalities. Left ventricular diastolic parameters are indeterminate.  2. Peak RV-RA gradient 34 mmHg. The IVC was not visualized. Right ventricular systolic function is normal. The right ventricular size is normal.  3. Left atrial size was severely dilated.  4. Right atrial size was mildly dilated.  5. Mitral valve repair s/p Mitraclip in A2-P2 position. Mean gradient 5 mmHg, suggesting mild stenosis (though gradient likely elevated by significant residual mitral regurgitation). There is moderate-severe mitral regurgitation that appears to originate laterally to the Mitraclip and is directed anteriorly.  6. The tricuspid valve is abnormal. Tricuspid valve regurgitation is moderate.  7. The aortic valve is tricuspid. Aortic valve regurgitation is not visualized. No aortic stenosis is present.  8. Small ASD from prior septal puncture with left to right flow.    Neuro/Psych negative neurological ROS     GI/Hepatic Neg liver ROS, GERD  ,  Endo/Other  negative endocrine ROS  Renal/GU negative Renal ROS     Musculoskeletal  (+) Arthritis ,   Abdominal   Peds  Hematology negative hematology ROS (+)   Anesthesia Other Findings   Reproductive/Obstetrics                            Anesthesia Physical Anesthesia Plan  ASA: 4  Anesthesia Plan: MAC   Post-op Pain Management:    Induction: Intravenous  PONV Risk Score and Plan: 2 and Propofol infusion, TIVA and Treatment may vary due to age or medical condition  Airway Management Planned: Natural Airway  Additional Equipment:   Intra-op Plan:   Post-operative Plan:   Informed Consent: I have reviewed the patients History and Physical, chart, labs and discussed the procedure  including the risks, benefits and alternatives for the proposed anesthesia with the patient or authorized representative who has indicated his/her understanding and acceptance.     Dental advisory given  Plan Discussed with: CRNA  Anesthesia Plan Comments:         Anesthesia Quick Evaluation

## 2022-06-16 NOTE — Anesthesia Procedure Notes (Signed)
Procedure Name: MAC Date/Time: 06/16/2022 10:58 AM  Performed by: Lowella Dell, CRNAPre-anesthesia Checklist: Patient identified, Emergency Drugs available, Suction available, Patient being monitored and Timeout performed Patient Re-evaluated:Patient Re-evaluated prior to induction Oxygen Delivery Method: Nasal cannula Placement Confirmation: positive ETCO2 Dental Injury: Teeth and Oropharynx as per pre-operative assessment

## 2022-06-16 NOTE — Op Note (Signed)
.  INDICATIONS: malfunction of valve repair  PROCEDURE:   Informed consent was obtained prior to the procedure. The risks, benefits and alternatives for the procedure were discussed and the patient comprehended these risks.  Risks include, but are not limited to, cough, sore throat, vomiting, nausea, somnolence, esophageal and stomach trauma or perforation, bleeding, low blood pressure, aspiration, pneumonia, infection, trauma to the teeth and death.    After a procedural time-out, the oropharynx was anesthetized with 20% benzocaine spray.   During this procedure the patient was administered IV propofol by Anesthesiology, Dr. Lissa Hoard  The transesophageal probe was inserted in the esophagus and stomach without difficulty and multiple views were obtained.  The patient was kept under observation until the patient left the procedure room.  The patient left the procedure room in stable condition.   Agitated microbubble saline contrast was not administered.  COMPLICATIONS:    There were no immediate complications.  FINDINGS:  The MitraClip remains well seated, firmly attached to both leaflets at A2-P2, with excellent tissue bridge. Immediately lateral to the clip, there is a very small flail segment of the posterior leaflet middle scallop (P2). There is moderate-to-severe MR through this area, anteriorly directed jet. There is no systolic flow reversal in the pulmonary veins, although on the right there is severe blunting of systolic flow. Mildly elevated mitral valve gradients. There is still a small iatrogenic ASD with brisk left to right flow. Moderate TR with moderately elevated sPAP.  RECOMMENDATIONS:     Consider repeat MitraClip. Discuss in structural heart meeting.  Time Spent Directly with the Patient:  66mnutes   Erin [name] 06/16/2022, 11:24 AM

## 2022-06-17 ENCOUNTER — Encounter (HOSPITAL_COMMUNITY): Payer: Self-pay | Admitting: Cardiovascular Disease

## 2022-06-17 NOTE — Anesthesia Postprocedure Evaluation (Signed)
Anesthesia Post Note  Patient: Erin [name]  Procedure(s) Performed: TRANSESOPHAGEAL ECHOCARDIOGRAM (TEE)     Patient location during evaluation: PACU Anesthesia Type: MAC Level of consciousness: awake and alert Pain management: pain level controlled Vital Signs Assessment: post-procedure vital signs reviewed and stable Respiratory status: spontaneous breathing Cardiovascular status: stable Anesthetic complications: no   No notable events documented.  Last Vitals:  Vitals:   06/16/22 1140 06/16/22 1150  BP: (!) 178/74 (!) 183/73  Pulse: 66 (!) 59  Resp: 20 18  Temp:    SpO2: 97% 98%    Last Pain:  Vitals:   06/16/22 1150  TempSrc:   PainSc: 0-No pain                 Nolon Nations

## 2022-06-18 ENCOUNTER — Ambulatory Visit (INDEPENDENT_AMBULATORY_CARE_PROVIDER_SITE_OTHER): Payer: Medicare Other | Admitting: Cardiovascular Disease

## 2022-06-18 ENCOUNTER — Encounter: Payer: Self-pay | Admitting: Cardiovascular Disease

## 2022-06-18 VITALS — BP 142/80 | HR 55 | Ht 63.0 in | Wt 116.0 lb

## 2022-06-18 DIAGNOSIS — I340 Nonrheumatic mitral (valve) insufficiency: Secondary | ICD-10-CM

## 2022-06-18 NOTE — Patient Instructions (Addendum)
Medication Instructions:  Your physician recommends that you continue on your current medications as directed. Please refer to the Current Medication list given to you today.  *If you need a refill on your cardiac medications before your next appointment, please call your pharmacy*   Lab Work: NONE If you have labs (blood work) drawn today and your tests are completely normal, you will receive your results only by: [facility] (if you have MyChart) OR A paper copy in the mail If you have any lab test that is abnormal or we need to change your treatment, we will call you to review the results.   Testing/Procedures: IN 6 MONTHS: Your physician has requested that you have an echocardiogram. Echocardiography is a painless test that uses sound waves to create images of your heart. It provides your doctor with information about the size and shape of your heart and how well your heart's chambers and valves are working. This procedure takes approximately one hour. There are no restrictions for this procedure.     Follow-Up: At [facility], you and your health needs are our priority.  As part of our continuing mission to provide you with exceptional heart care, we have created designated Provider Care Teams.  These Care Teams include your primary Cardiologist (physician) and Advanced Practice Providers (APPs -  Physician Assistants and Nurse Practitioners) who all work together to provide you with the care you need, when you need it.     Your next appointment:   6 month(s)  The format for your next appointment:   In Person  Provider:   Sherren Mocha, MD   Important Information About [place]

## 2022-06-18 NOTE — Progress Notes (Addendum)
Cardiology Office Note:    Date:  06/22/2022   ID:  Erin [name], DOB 03-Oct-1935, MRN [id number]  PCP:  Tammi Sou, MD   [facility] Providers Cardiologist:  Sinclair Grooms, MD     Referring MD: Tammi Sou, MD   Chief Complaint  Patient presents with   Shortness of Breath    History of Present Illness:    Erin [name] is a 87 y.o. female presenting for follow-up of mitral regurgitation.  The patient was recently seen in June 02, 2022 for follow-up evaluation.  She underwent transcatheter edge-to-edge repair of the mitral valve in January 2022 for treatment of severe primary mitral regurgitation with prolapse and flail of the posterior leaflet.  She initially had an acceptable result with mild to moderate residual mitral regurgitation, but a follow-up echocardiogram earlier this year showed recurrence of at least moderately severe mitral regurgitation with a new flail segment adjacent to the clip.  After her recent evaluation, she was referred for transesophageal echo to assess her treatment options.  She returns today for further discussion.  She reports no change in symptoms since her last evaluation.  Symptoms include exertional dyspnea and fatigue.  She continues to deny orthopnea, PND, or edema.  Past Medical History:  Diagnosis Date   Achilles tendon rupture    left, no surgery required   Chronic diastolic heart failure ([place])    Gross hematuria 12/2020   Klebsiella on urine clx x2 but no UTI sx's.  CT showed tiny stones in each renal pelvis but no ureteral or bladder stones, no mass.  Urol did cysto->normal. Plan is annual UA, rpt hematuria w/u in 3-5 yrs if persistent pos.   History of kidney stones    Hypertension    Hypertensive retinopathy of both eyes    Dr. Herbert Deaner   Low back pain    spondylosis, listhesis, +scoliosis at L/S jxn   Lower extremity edema    lasix prn   OA (osteoarthritis)    Osteopenia    Osteoporosis 07/2021   07/2021 T  score -4.6   S/P mitral valve clip implantation 11/06/2020   s/p TEER with one XTW MitraClip on A2P2 by Dr. Burt Knack.  DAPT w/ASA and plavix x 3 mo post procedure recommended.   Severe mitral regurgitation    2021 transthoracic echo and TEE->cardiology following.  Mitral valve clip 10/2020.  f/u echo 10/28/20 mild/mod MVR and tricusp regurg   Severe pulmonary hypertension ([place]) 2021   transth echo; moderate by TEE 37/9024   Systolic murmur 09/73/5329   severe mitral regurge, mod/severe tricuspic regurg   Tremor of right hand     Past Surgical History:  Procedure Laterality Date   APPENDECTOMY     87 yrs old   [place]  2004   ruptured disc   CHOLECYSTECTOMY     age 87   CYSTOSCOPY  02/24/2021   (for gross hematuria) ->normal.   DEXA  07/2021   2022 T score -4.6 (radius)   LUMBAR LAMINECTOMY  2004   MITRAL VALVE REPAIR N/A 11/06/2020   Procedure: MITRAL VALVE REPAIR;  Surgeon: Sherren Mocha, MD;  Location: [facility];  Service: Cardiovascular;  Laterality: N/A;   RIGHT/LEFT HEART CATH AND CORONARY ANGIOGRAPHY N/A 09/16/2020   No CAD. Procedure: RIGHT/LEFT HEART CATH AND CORONARY ANGIOGRAPHY;  Surgeon: Belva Crome, MD;  Location: [facility];  Service: Cardiovascular;  Laterality: N/A;   TEE WITHOUT CARDIOVERSION N/A 09/16/2020  Procedure: TRANSESOPHAGEAL ECHOCARDIOGRAM (TEE);  Surgeon: Buford Dresser, MD;  Location: [facility] ENDOSCOPY;  Service: Cardiovascular;  Laterality: N/A;   TEE WITHOUT CARDIOVERSION N/A 11/06/2020   Procedure: TRANSESOPHAGEAL ECHOCARDIOGRAM (TEE);  Surgeon: Sherren Mocha, MD;  Location: [facility];  Service: Cardiovascular;  Laterality: N/A;   TEE WITHOUT CARDIOVERSION N/A 06/16/2022   Procedure: TRANSESOPHAGEAL ECHOCARDIOGRAM (TEE);  Surgeon: Sanda Klein, MD;  Location: [facility]COPY;  Service: Cardiovascular;  Laterality: N/A;   TONSILLECTOMY     age 16   TOTAL KNEE ARTHROPLASTY Left 06/02/2021   Procedure: LEFT TOTAL  KNEE ARTHROPLASTY;  Surgeon: Renette Butters, MD;  Location: [facility];  Service: Orthopedics;  Laterality: Left;   TRANSTHORACIC ECHOCARDIOGRAM  07/03/2020; 10/28/20; 12/03/20   06/2020 TTE EF 60-65%, grd II DD, severe pulm art HTN, severe mitral valve regurg, mod/sev tricuspic valve regurg. Conf on TEE 08/2020.  10/28/20 (s/p MV clip procedure) mild-mod MVR, EF 55-60%. 11/2020 EF 60%, mild/mod MR w/mean grad 72m hg and mod to sev TR.    Current Medications: Current Meds  Medication Sig   Ascorbic Acid (VITAMIN C PO) Take 1,000 mg by mouth in the morning.   azelastine (ASTELIN) 0.1 % nasal spray Place 2 sprays into both nostrils 2 (two) times daily.   cholecalciferol (VITAMIN D) 25 MCG (1000 UNIT) tablet Take 1,000 Units by mouth in the morning.   Cyanocobalamin (VITAMIN B-12 PO) Take 1 tablet by mouth in the morning.   furosemide (LASIX) 20 MG tablet Take 1 tablet (20 mg total) by mouth daily. (Patient taking differently: Take 20 mg by mouth daily as needed (fluid retention).)   gabapentin (NEURONTIN) 300 MG capsule Take 300 mg by mouth at bedtime.   ibandronate (BONIVA) 150 MG tablet Take 1 tablet (150 mg total) by mouth every 30 (thirty) days. Take in the morning with a full glass of water, on an empty stomach, and do not take anything else by mouth or lie down for the next 30 min.   ibuprofen (ADVIL) 200 MG tablet Take 400 mg by mouth every 8 (eight) hours as needed (pain.).   Multiple Vitamin (MULTIVITAMIN WITH MINERALS) TABS tablet Take 1 tablet by mouth in the morning.   Multiple Vitamins-Minerals (PRESERVISION AREDS PO) Take 1 tablet by mouth in the morning and at bedtime.    nitroGLYCERIN (NITRODUR - DOSED IN MG/24 HR) 0.2 mg/hr patch 1/2 patch over L Achilles tendon bid (Patient taking differently: Place 1 patch onto the skin daily. over L Achilles tendon bid)   pantoprazole (PROTONIX) 40 MG tablet Take 1 tablet (40 mg total) by mouth 2 (two) times daily.   potassium chloride (KLOR-CON) 10 MEQ  tablet Take 10 mEq by mouth daily as needed (with furosemide (fluid retention)).   sodium chloride (MURO 128) 5 % ophthalmic solution Place 1 drop into both eyes at bedtime as needed (dry/irritated eyes.).   vitamin E 180 MG (400 UNITS) capsule Take 400 Units by mouth in the morning.     Allergies:   Barbiturates, Demerol, Doxycycline, Hydrochlorothiazide, Irbesartan, Losartan potassium, Olmesartan medoxomil, and Telmisartan   Social History   Socioeconomic History   Marital status: Married    Spouse name: Not on file   Number of children: Not on file   Years of education: Not on file   Highest education level: Not on file  Occupational History   Not on file  Tobacco Use   Smoking status: Never   Smokeless tobacco: Never  Vaping Use   Vaping Use:  Never used  Substance and Sexual Activity   Alcohol use: No   Drug use: Never   Sexual activity: Not Currently  Other Topics Concern   Not on file  Social History Narrative   Married, 6 children, about 15 GC.  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   No tob.   No alc.   Social Determinants of Health   Financial Resource Strain: Low Risk  (07/19/2021)   Overall Financial Resource Strain (CARDIA)    Difficulty of Paying Living Expenses: Not hard at all  Food Insecurity: No Food Insecurity (07/19/2021)   Hunger Vital Sign    Worried About Running Out of Food in the Last Year: Never true    Ran Out of Food in the Last Year: Never true  Transportation Needs: No Transportation Needs (07/19/2021)   PRAPARE - Hydrologist (Medical): No    Lack of Transportation (Non-Medical): No  Physical Activity: Sufficiently Active (07/19/2021)   Exercise Vital Sign    Days of Exercise per Week: 7 days    Minutes of Exercise per Session: 30 min  Stress: No Stress Concern Present (07/19/2021)   [place]    Feeling of Stress : Not at all  Social Connections: [place] (07/19/2021)   Social Connection and Isolation Panel [NHANES]    Frequency of Communication with Friends and Family: More than three times a week    Frequency of Social Gatherings with Friends and Family: More than three times a week    Attends Religious Services: More than 4 times per year    Active Member of [employer] or Organizations: Yes    Attends Music therapist: More than 4 times per year    Marital Status: Married     Family History: The patient's family history includes Cancer in her father; Early death in her father; Hypertension in her mother and another family member; Liver cancer in her father. There is no history of Colon cancer or Rectal cancer.  ROS:   Please see the history of present illness.    All other systems reviewed and are negative.  EKGs/Labs/Other Studies Reviewed:    The following studies were reviewed today: TEE: 1. Left ventricular ejection fraction, by estimation, is 60 to 65%. The  left ventricle has normal function. The left ventricle has no regional  wall motion abnormalities. Left ventricular diastolic function could not  be evaluated.   2. Right ventricular systolic function is normal. The right ventricular  size is normal. There is moderately elevated pulmonary artery systolic  pressure. The estimated right ventricular systolic pressure is 90.2 mmHg.   3. Left atrial size was severely dilated. No left atrial/left atrial  appendage thrombus was detected.   4. Right atrial size was mildly dilated.   5. A well seated MitraClip XTW is firmly attached at A2-P2, with good  tissue bridge. Immediately lateral to the clip is a small segment of flail  posterior leaflet (still at P2). Effective regurgitant orifice area is  0.56 cmsq, regurgitant volume is 97  mL, regurgitant fraction 69%. There is complete blunting of systolic  forward flow in the right  pulmonary veins, but there is no flow reversal.  Diastolic dominant flow in the left pulmonary veins. Vena contracta 3 mm.  Cumulative area of neo-mitral orifice   by  planimetry is 3.46 cm sq by planimetry, 3.20 cm sq by PHT. The mitral  valve is myxomatous. Moderate to severe mitral valve regurgitation. The  mean mitral valve gradient is 4.3 mmHg with average heart rate of 66 bpm.  There is a Mitra-Clip present in  the mitral position. Procedure Date: 11/06/20.   6. Tricuspid valve regurgitation is moderate.   7. The aortic valve is tricuspid. Aortic valve regurgitation is not  visualized. No aortic stenosis is present.   8. Evidence of atrial level shunting detected by color flow Doppler.  There is a small secundum atrial septal defect with exclusively left to  right shunting across the atrial septum.    Recent Labs: 07/22/2021: ALT 6 06/07/2022: BUN 15; Creatinine, Ser 0.68; Hemoglobin 11.9; Platelets 261; Potassium 4.2; Sodium 146  Recent Lipid Panel    Component Value Date/Time   CHOL 139 07/02/2020 1057   TRIG 30.0 07/02/2020 1057   TRIG 33 09/21/2006 1115   HDL 80.30 07/02/2020 1057   CHOLHDL 2 07/02/2020 1057   VLDL 6.0 07/02/2020 1057   LDLCALC 53 07/02/2020 1057   Risk Assessment/Calculations:     STS Risk Calculation:  Isolated MVR Procedure Type: Isolated MVR Perioperative Outcome Estimate % Operative Mortality 6.72% Morbidity & Mortality 16% Stroke 2.25% Renal Failure 1.67% Reoperation 5.79% Prolonged Ventilation 6.84% Deep Sternal Wound Infection 0.031% [place] Hospital Stay (>14 days) 8.19% Short Hospital Stay (<6 days)* 20.6%  Isolated MV Repair:  Procedure Type: Isolated MVr Perioperative Outcome Estimate % Operative Mortality 4.31% Morbidity & Mortality 9.91% Stroke 6.06% Renal Failure 1.28% Reoperation 4.27% Prolonged Ventilation 4.7% Deep Sternal Wound Infection 0.027% [place] Hospital Stay (>14 days) 5.12% Short Hospital Stay (<6  days)* 35.5%  Physical Exam:    VS:  BP (!) 142/80   Pulse (!) 55   Ht '5\' 3"'$  (1.6 m)   Wt 116 lb (52.6 kg)   SpO2 98%   BMI 20.55 kg/m     Wt Readings from Last 3 Encounters:  06/18/22 116 lb (52.6 kg)  06/16/22 118 lb 9.6 oz (53.8 kg)  06/02/22 118 lb 9.6 oz (53.8 kg)     GEN:  Well nourished, well developed in no acute distress HEENT: Normal NECK: No JVD; No carotid bruits LYMPHATICS: No lymphadenopathy CARDIAC: RRR, 3/6 holosystolic murmur at the LLSB and apex RESPIRATORY:  Clear to auscultation without rales, wheezing or rhonchi  ABDOMEN: Soft, non-tender, non-distended MUSCULOSKELETAL:  No edema; No deformity  SKIN: Warm and dry NEUROLOGIC:  Alert and oriented x 3 PSYCHIATRIC:  Normal affect   ASSESSMENT:    1. Severe mitral regurgitation    PLAN:    In order of problems listed above:  The patient has recurrent mitral regurgitation, at least 3+, associated with NYHA functional class II-III symptoms of exertional dyspnea and fatigue.  Her transesophageal echo is personally reviewed.  It shows an intact MitraClip device positioned A2/P2.  Just lateral to the clip there is a small flail with severe eccentric MR.  The planimeter valve area is about 3.5 cm and the mean gradient is 4.3 mmHg.  I reviewed potential treatment options with the patient.  These include ongoing medical therapy and echo surveillance, repeat MitraClip, or high risk surgery at her advanced age of 87 years old.  I think a repeat MitraClip procedure is potentially feasible.  I would likely treat her with a NT clip, which is the smallest clip available and least likely to cause mitral stenosis.  Even with an NT clip, mitral  stenosis could be a concern based on her current valve area calculations.  We reviewed specific issues regarding this, including the possibility that a clip is placed with good reduction in MR, but could potentially have to be removed if it creates too much mitral stenosis.  I advised her  that we would review her case in our multidisciplinary valve team meeting before providing a final treatment recommendation.  I will contact her after our meeting for further discussion.           Medication Adjustments/Labs and Tests Ordered: Current medicines are reviewed at length with the patient today.  Concerns regarding medicines are outlined above.  Orders Placed This Encounter  Procedures   ECHOCARDIOGRAM COMPLETE   No orders of the defined types were placed in this encounter.   Patient Instructions  Medication Instructions:  Your physician recommends that you continue on your current medications as directed. Please refer to the Current Medication list given to you today.  *If you need a refill on your cardiac medications before your next appointment, please call your pharmacy*   Lab Work: NONE If you have labs (blood work) drawn today and your tests are completely normal, you will receive your results only by: [place] (if you have MyChart) OR A paper copy in the mail If you have any lab test that is abnormal or we need to change your treatment, we will call you to review the results.   Testing/Procedures: IN 6 MONTHS: Your physician has requested that you have an echocardiogram. Echocardiography is a painless test that uses sound waves to create images of your heart. It provides your doctor with information about the size and shape of your heart and how well your heart's chambers and valves are working. This procedure takes approximately one hour. There are no restrictions for this procedure.     Follow-Up: At [facility], you and your health needs are our priority.  As part of our continuing mission to provide you with exceptional heart care, we have created designated Provider Care Teams.  These Care Teams include your primary Cardiologist (physician) and Advanced Practice Providers (APPs -  Physician Assistants and Nurse Practitioners) who all work  together to provide you with the care you need, when you need it.     Your next appointment:   6 month(s)  The format for your next appointment:   In Person  Provider:   Sherren Mocha, MD   Important Information About [place]         Signed, Sherren Mocha, MD  06/22/2022 6:12 AM    [facility]  Addendum 07/01/2022: Our valve team has reviewed the patient's case including her transesophageal echo images.  We agree that it is technically feasible to place MitraClip just lateral to the indwelling clip in order to reduce the patient's mitral regurgitation.  There is some risk of mitral stenosis, but this risk cannot be clearly predicted until the clip is placed.  The patient understands that there would be a chance that if her transmitral gradient is too high after MitraClip placement, the clip would have to be removed and she would be left with her current level of mitral regurgitation.  We have otherwise reviewed the MitraClip procedure at length and she understands the risks, indications, and alternatives.  She has been through the procedure once before and also understands the recovery period.  She has been feeling more short of breath and would like to proceed with an attempt of transcatheter  edge-to-edge repair of the mitral valve.  It looks like her next scheduled date is October 5 which she states works well for her schedule as her husband is going to be out of town for a while this month.  We will bring her back in for a preoperative office visit to set everything up for the procedure.  Eugene Gavia 07/01/2022 2:37 PM

## 2022-06-20 DIAGNOSIS — R002 Palpitations: Secondary | ICD-10-CM | POA: Diagnosis not present

## 2022-06-20 DIAGNOSIS — I340 Nonrheumatic mitral (valve) insufficiency: Secondary | ICD-10-CM

## 2022-06-22 ENCOUNTER — Encounter: Payer: Self-pay | Admitting: Cardiovascular Disease

## 2022-06-23 ENCOUNTER — Ambulatory Visit: Payer: Medicare Other | Admitting: Cardiovascular Disease

## 2022-06-23 ENCOUNTER — Telehealth: Payer: Self-pay

## 2022-06-23 NOTE — Telephone Encounter (Signed)
[place] review of 06/16/22 TEE: "For patient DE: this is an evaluation for additional clip  This looks like a suitable valve for a MitraClip. The fossa looks approachable for transseptal puncture in the SAXB and Bicaval views. LA dimensions are large enough for device steering and straddle. The residual MR is lateral of the existing clip and caused by residual prolapse. The posterior leaflet measures over 60m in the LVOT degree grasping view. MVA & gradient not provided. I'd like to verify both in the procedure prior to the start and based clip size on this information. Clip will be placed next to existing, just lateral.  *TR noted"

## 2022-06-30 DIAGNOSIS — R002 Palpitations: Secondary | ICD-10-CM | POA: Diagnosis not present

## 2022-06-30 DIAGNOSIS — I340 Nonrheumatic mitral (valve) insufficiency: Secondary | ICD-10-CM | POA: Diagnosis not present

## 2022-07-01 ENCOUNTER — Telehealth: Payer: Self-pay

## 2022-07-01 NOTE — Telephone Encounter (Signed)
Per Dr. Burt Knack, discussed MitraClip planning with the patient and she wishes to have procedure on 07/29/2022. Pre-procedure visit and PAT appointment scheduled 10/2. The patient was grateful for call and agrees with plan.

## 2022-07-06 ENCOUNTER — Ambulatory Visit: Payer: Medicare Other | Admitting: Interventional Cardiology

## 2022-07-06 ENCOUNTER — Other Ambulatory Visit: Payer: Self-pay

## 2022-07-06 DIAGNOSIS — I340 Nonrheumatic mitral (valve) insufficiency: Secondary | ICD-10-CM

## 2022-07-09 ENCOUNTER — Ambulatory Visit: Payer: Medicare Other | Admitting: Cardiovascular Disease

## 2022-07-17 IMAGING — CT CT ABD-PEL WO/W CM
3 of 10 series · 12 of 46 positions shown, 18 images · IV contrast (omnipaque)
Comparison: None.

CLINICAL DATA: Gross hematuria

EXAM:
CT ABDOMEN AND PELVIS WITHOUT AND WITH CONTRAST
TECHNIQUE: Multidetector CT imaging of the abdomen and pelvis was performed
following the standard protocol before and following the bolus
administration of intravenous contrast.
CONTRAST:  100mL OMNIPAQUE IOHEXOL 300 MG/ML  SOLN

[Series 4: coronal pre · coronal · non-contrast · 0.67mm/px · 2 of 85 slices shown, 3 images]
[im 29/85  soft-tissue]
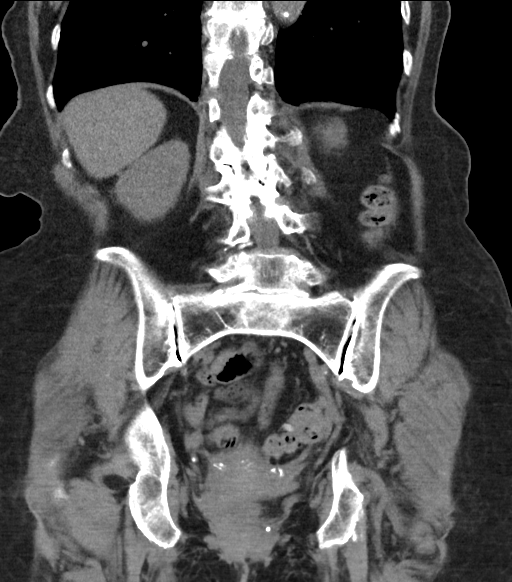
[im 29/85  bone]
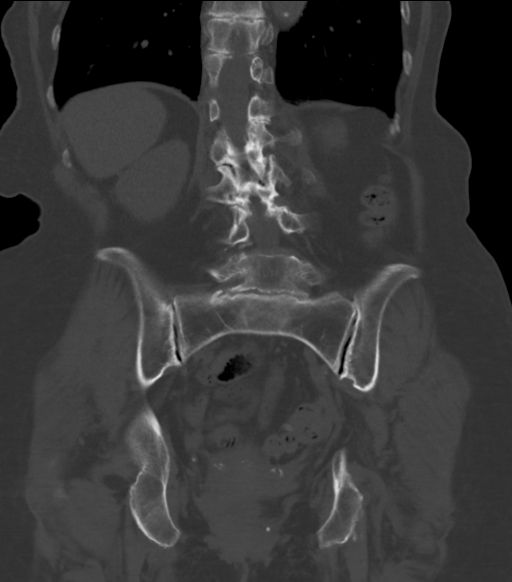
[im 57/85  soft-tissue]
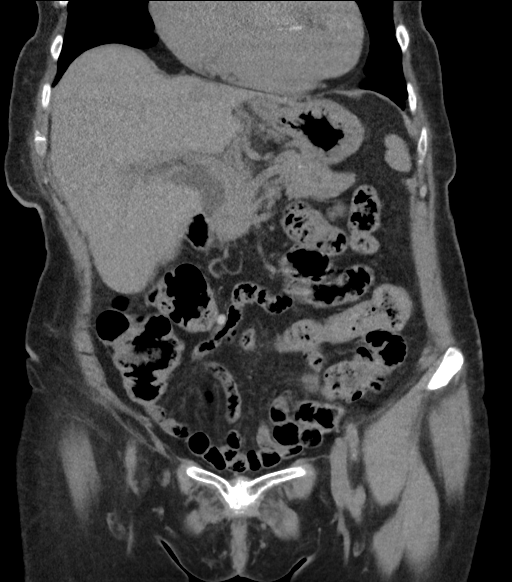

[Series 8: lung bases · axial · 0.69mm/px · z∈[+1106,+1126]mm · 2 of 30 slices shown]
[im 10/30  bone]
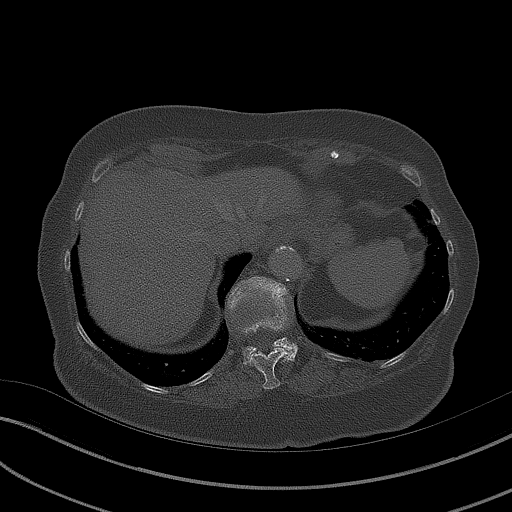
[im 20/30  bone]
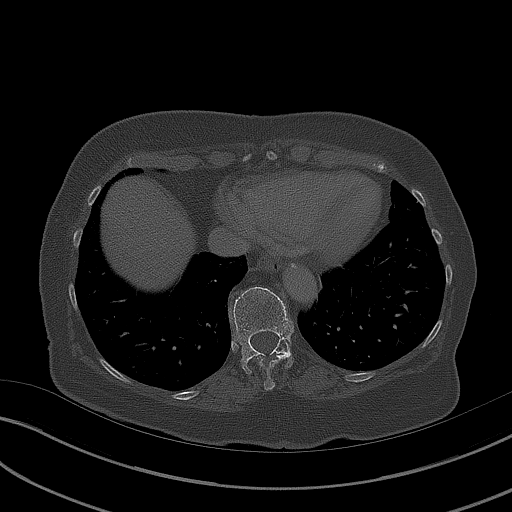

[Series 12: axial delay · axial · delayed · 0.84mm/px · z∈[+856,+1170]mm · 8 of 81 slices shown, 13 images]
[im 9/81  soft-tissue]
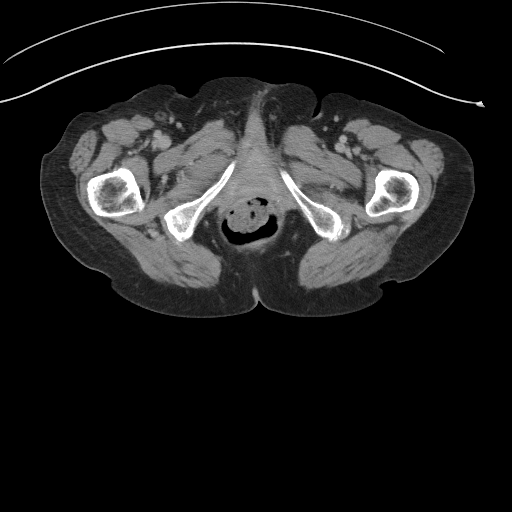
[im 9/81  bone]
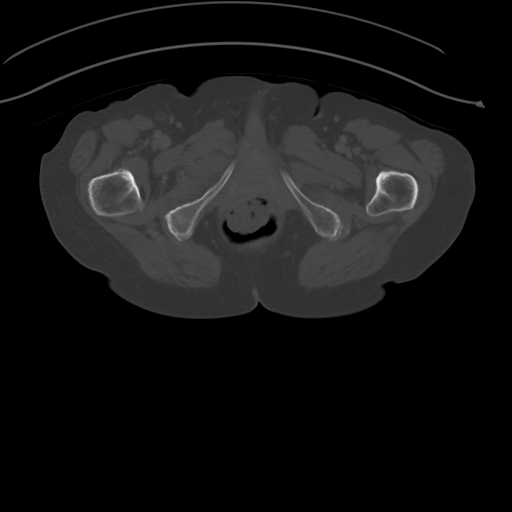
[im 18/81  soft-tissue]
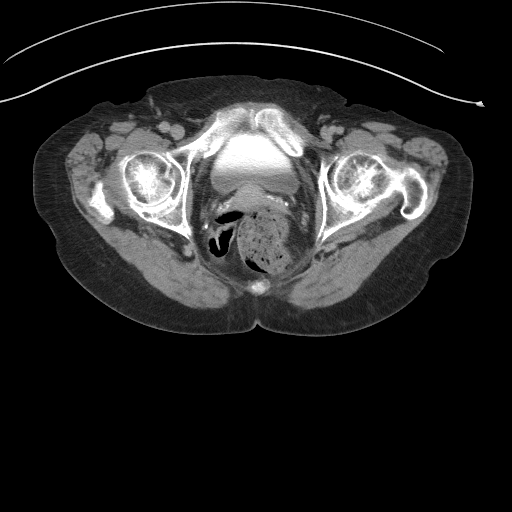
[im 27/81  soft-tissue]
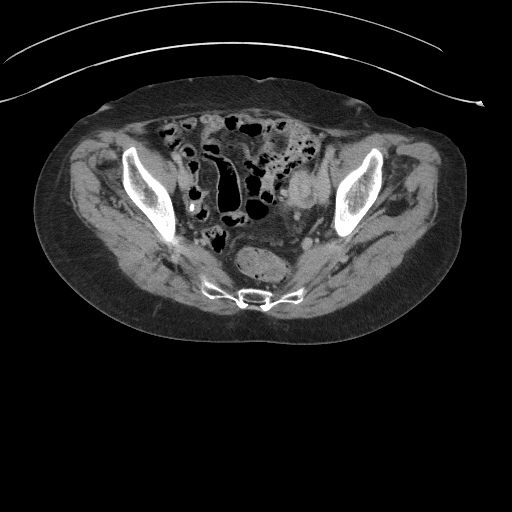
[im 36/81  soft-tissue]
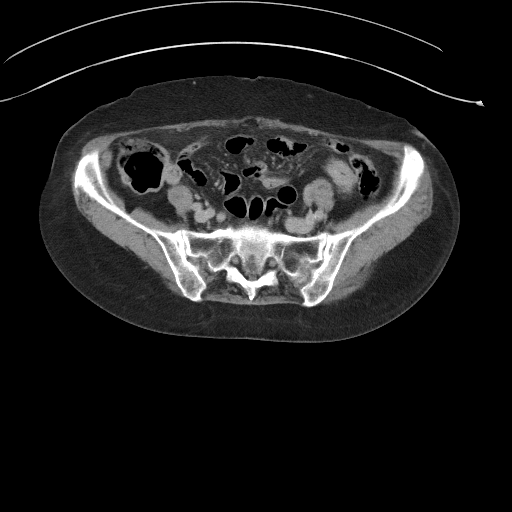
[im 45/81  soft-tissue]
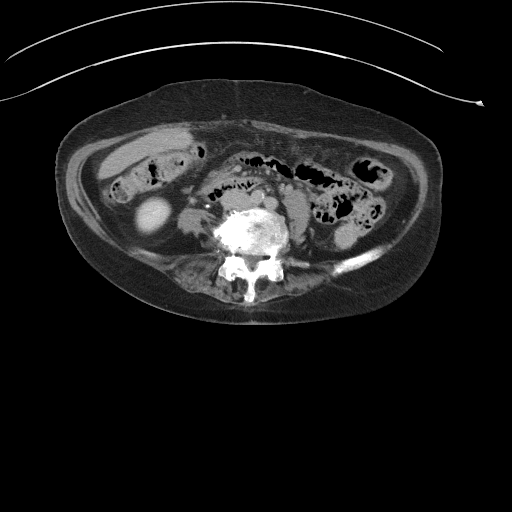
[im 45/81  lung]
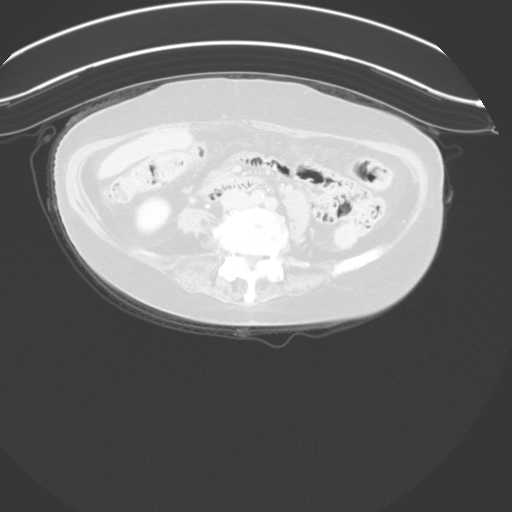
[im 54/81  soft-tissue]
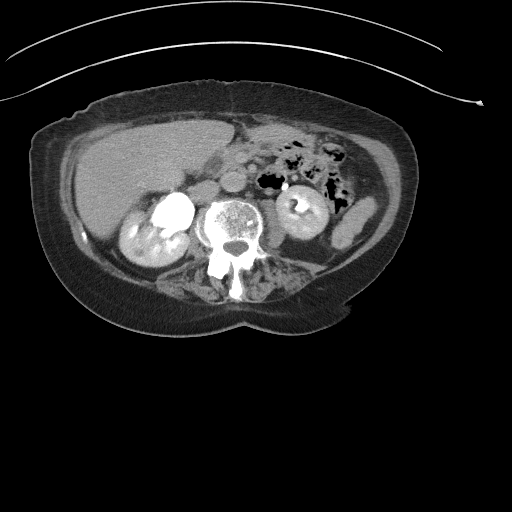
[im 54/81  lung]
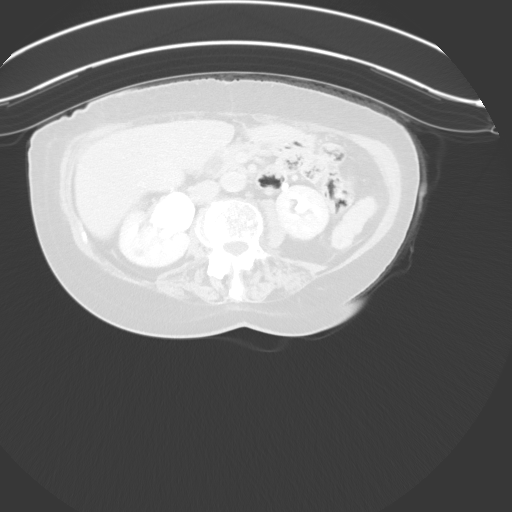
[im 63/81  soft-tissue]
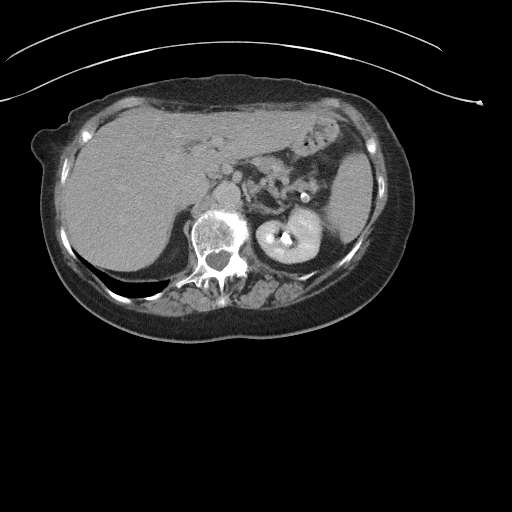
[im 63/81  lung]
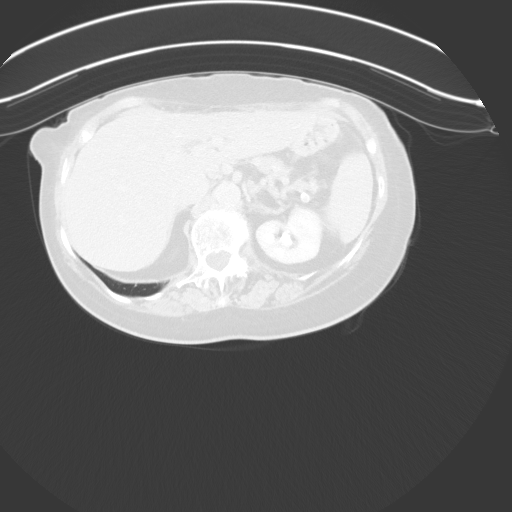
[im 72/81  soft-tissue]
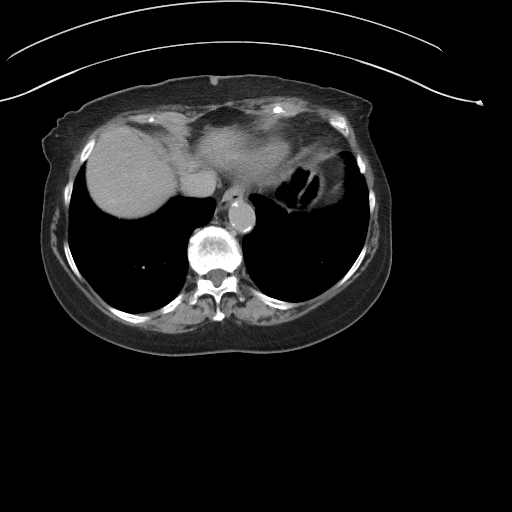
[im 72/81  lung]
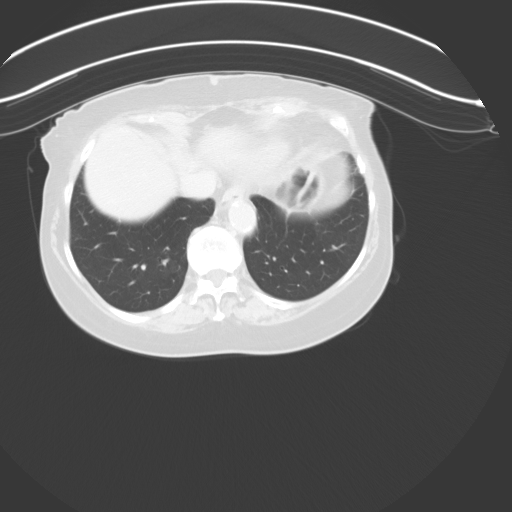

[12 of 46 positions shown; findings below may reference images not displayed]

FINDINGS: Lower chest: Lung bases are clear.

Hepatobiliary: No focal hepatic lesion on noncontrast exam.
Gallbladder normal. Common bile duct is dilated to 13 mm. This is
atypical in patient with gallbladder intact. However, on comparison
report from 1559, the common bile duct measures 15 mm therefore
favor benign dilatation

Pancreas: Pancreas normal noncontrast exam no duct dilatation.

Spleen: Normal spleen

Adrenals/urinary tract: Adrenal glands normal. Single nonobstructing
3 mm calculus in lower pole of the RIGHT kidney. Extrarenal pelvis
on the RIGHT. No ureterolithiasis or obstructive uropathy.

Tiny punctate 1 mm calculus in lower pole of the LEFT kidney. No
LEFT ureterolithiasis. No bladder calculi.

Simple fluid attenuation cystic appearing lesion extends from the
RIGHT kidney

Stomach/Bowel: Stomach, small bowel, appendix, and cecum are
normal. Multiple diverticula of the descending colon and sigmoid
colon without acute inflammation.

Vascular/Lymphatic: Abdominal aorta is normal caliber with
atherosclerotic calcification. There is no retroperitoneal or
periportal lymphadenopathy. No pelvic lymphadenopathy.

Reproductive: Uterus and adnexa unremarkable.

Other: No free fluid.

Musculoskeletal: No aggressive osseous lesion.
IMPRESSION: 1. Bilateral small nonobstructing renal calculi.
2. No ureterolithiasis or obstructive uropathy.
3. Extrarenal pelvis on the RIGHT.
4. Benign appearing RIGHT renal cysts.
5. Chronic dilatation of the common bile duct by report [DATE]

## 2022-07-20 ENCOUNTER — Telehealth: Payer: Self-pay | Admitting: Family Medicine

## 2022-07-20 NOTE — Telephone Encounter (Signed)
Spoke with patient she stated she will call back to schedule AWV after her procedure in October 2023

## 2022-07-21 ENCOUNTER — Ambulatory Visit: Payer: Medicare Other

## 2022-07-23 NOTE — Pre-Procedure Instructions (Signed)
Surgical Instructions    Your procedure is scheduled on Thursday, July 29, 2022 at 7:45 AM.  Report to [facility] Main Entrance "A" at 5:45 A.M., then check in with the Admitting office.  Call this number if you have problems the morning of surgery:  [phone]   If you have any questions prior to your surgery date call [phone]: Open Monday-Friday 8am-4pm  *If you experience any cold or flu symptoms such as cough, fever, chills, shortness of breath, etc. between now and your scheduled surgery, please notify us.*    Remember:  Do not eat or drink after midnight the night before your surgery    Take these medicines the morning of surgery with A SIP OF WATER:  azelastine (ASTELIN) 0.1 % nasal spray carvedilol (COREG) omeprazole (PRILOSEC)   As of today, STOP taking any Aspirin (unless otherwise instructed by your surgeon) Aleve, Naproxen, Ibuprofen, Motrin, Advil, Goody's, BC's, all herbal medications, fish oil, and all vitamins.                     Do NOT Smoke (Tobacco/Vaping) for 24 hours prior to your procedure.  If you use a CPAP at night, you may bring your mask/headgear for your overnight stay.   Contacts, glasses, piercing's, hearing aid's, dentures or partials may not be worn into surgery, please bring cases for these belongings.    For patients admitted to the hospital, discharge time will be determined by your treatment team.   Patients discharged the day of surgery will not be allowed to drive home, and someone needs to stay with them for 24 hours.  SURGICAL WAITING ROOM VISITATION Patients having surgery or a procedure may have two support people in the waiting area. Visitors may stay in the waiting area during the procedure and switch out with other visitors if needed. Children under the age of 71 must have an adult accompany them who is not the patient. If the patient needs to stay at the hospital during part of their recovery, the visitor guidelines for  inpatient rooms apply.  Please refer to the [facility] website for the visitor guidelines for Inpatients (after your surgery is over and you are in a regular room).    Special instructions:   [place]- Preparing For Surgery  Before surgery, you can play an important role. Because skin is not sterile, your skin needs to be as free of germs as possible. You can reduce the number of germs on your skin by washing with CHG (chlorahexidine gluconate) Soap before surgery.  CHG is an antiseptic cleaner which kills germs and bonds with the skin to continue killing germs even after washing.    Oral Hygiene is also important to reduce your risk of infection.  Remember - BRUSH YOUR TEETH THE MORNING OF SURGERY WITH YOUR REGULAR TOOTHPASTE  Please do not use if you have an allergy to CHG or antibacterial soaps. If your skin becomes reddened/irritated stop using the CHG.  Do not shave (including legs and underarms) for at least 48 hours prior to first CHG shower. It is OK to shave your face.  Please follow these instructions carefully.   Shower the NIGHT BEFORE SURGERY and the MORNING OF SURGERY  If you chose to wash your hair, wash your hair first as usual with your normal shampoo.  After you shampoo, rinse your hair and body thoroughly to remove the shampoo.  Use CHG Soap as you would any other liquid soap. You can apply  CHG directly to the skin and wash gently with a scrungie or a clean washcloth.   Apply the CHG Soap to your body ONLY FROM THE NECK DOWN.  Do not use on open wounds or open sores. Avoid contact with your eyes, ears, mouth and genitals (private parts). Wash Face and genitals (private parts)  with your normal soap.   Wash thoroughly, paying special attention to the area where your surgery will be performed.  Thoroughly rinse your body with warm water from the neck down.  DO NOT shower/wash with your normal soap after using and rinsing off the CHG Soap.  Pat yourself dry with a  CLEAN TOWEL.  Wear CLEAN PAJAMAS to bed the night before surgery  Place CLEAN SHEETS on your bed the night before your surgery  DO NOT SLEEP WITH PETS.   Day of Surgery: Take a shower with CHG soap. Do not wear jewelry or makeup Do not wear lotions, powders, perfumes/colognes, or deodorant. Do not shave 48 hours prior to surgery. Do not bring valuables to the hospital.  [facility] is not responsible for any belongings or valuables. Do not wear nail polish, gel polish, artificial nails, or any other type of covering on natural nails (fingers and toes) If you have artificial nails or gel coating that need to be removed by a nail salon, please have this removed prior to surgery. Artificial nails or gel coating may interfere with anesthesia's ability to adequately monitor your vital signs. Wear Clean/Comfortable clothing the morning of surgery Do not apply any deodorants/lotions.   Remember to brush your teeth WITH YOUR REGULAR TOOTHPASTE.   Please read over the following fact sheets that you were given.  If you received a COVID test during your pre-op visit  it is requested that you wear a mask when out in public, stay away from anyone that may not be feeling well and notify your surgeon if you develop symptoms. If you have been in contact with anyone that has tested positive in the last 10 days please notify you surgeon.

## 2022-07-26 ENCOUNTER — Ambulatory Visit (INDEPENDENT_AMBULATORY_CARE_PROVIDER_SITE_OTHER): Payer: Medicare Other | Admitting: Family Medicine

## 2022-07-26 ENCOUNTER — Ambulatory Visit: Payer: Medicare Other | Attending: Cardiovascular Disease | Admitting: Cardiovascular Disease

## 2022-07-26 ENCOUNTER — Encounter: Payer: Self-pay | Admitting: Cardiovascular Disease

## 2022-07-26 ENCOUNTER — Encounter: Payer: Self-pay | Admitting: Family Medicine

## 2022-07-26 ENCOUNTER — Inpatient Hospital Stay (HOSPITAL_COMMUNITY)
Admission: RE | Admit: 2022-07-26 | Discharge: 2022-07-26 | Disposition: A | Discharge status: Home / Self Care | Payer: Medicare Other | Source: Ambulatory Visit

## 2022-07-26 VITALS — BP 146/70 | HR 61 | Temp 98.2°F | Ht 63.0 in | Wt 116.6 lb

## 2022-07-26 VITALS — BP 122/48 | HR 58 | Ht 63.0 in | Wt 156.0 lb

## 2022-07-26 DIAGNOSIS — N3000 Acute cystitis without hematuria: Secondary | ICD-10-CM | POA: Diagnosis not present

## 2022-07-26 DIAGNOSIS — I340 Nonrheumatic mitral (valve) insufficiency: Secondary | ICD-10-CM | POA: Diagnosis not present

## 2022-07-26 DIAGNOSIS — R300 Dysuria: Secondary | ICD-10-CM

## 2022-07-26 MED ORDER — SULFAMETHOXAZOLE-TRIMETHOPRIM 800-160 MG PO TABS: 1.0000 | ORAL_TABLET | Freq: Two times a day (BID) | ORAL | 0 refills | Status: DC

## 2022-07-26 NOTE — Progress Notes (Signed)
OFFICE VISIT  07/26/2022  CC:  Chief Complaint  Patient presents with   Possible bladder infection    Pt c/o burning with urination and abd pressure like she has to go for 4 days. She took AZO for 3 days (9/29-10/1).     Patient is a 87 y.o. female who presents for concern of bladder infection.  HPI: Onset 3 d/a burning, urgency, frequency. No blood in urine, no fever or flank pain. Took AZO several times but none today or yesterday.  Past Medical History:  Diagnosis Date   Achilles tendon rupture    left, no surgery required   Chronic diastolic heart failure ([place])    Gross hematuria 12/2020   Klebsiella on urine clx x2 but no UTI sx's.  CT showed tiny stones in each renal pelvis but no ureteral or bladder stones, no mass.  Urol did cysto->normal. Plan is annual UA, rpt hematuria w/u in 3-5 yrs if persistent pos.   History of kidney stones    Hypertension    Hypertensive retinopathy of both eyes    Dr. Herbert Deaner   Low back pain    spondylosis, listhesis, +scoliosis at L/S jxn   Lower extremity edema    lasix prn   OA (osteoarthritis)    Osteopenia    Osteoporosis 07/2021   07/2021 T score -4.6   S/P mitral valve clip implantation 11/06/2020   s/p TEER with one XTW MitraClip on A2P2 by Dr. Burt Knack.  DAPT w/ASA and plavix x 3 mo post procedure recommended.   Severe mitral regurgitation    2021 transthoracic echo and TEE->cardiology following.  Mitral valve clip 10/2020.  f/u echo 10/28/20 mild/mod MVR and tricusp regurg   Severe pulmonary hypertension ([place]) 2021   transth echo; moderate by TEE 57/2620   Systolic murmur 35/59/7416   severe mitral regurge, mod/severe tricuspic regurg   Tremor of right hand     Past Surgical History:  Procedure Laterality Date   APPENDECTOMY     87 yrs old   [place]  2004   ruptured disc   CHOLECYSTECTOMY     age 89   CYSTOSCOPY  02/24/2021   (for gross hematuria) ->normal.   DEXA  07/2021   2022 T score -4.6 (radius)   LUMBAR  LAMINECTOMY  2004   MITRAL VALVE REPAIR N/A 11/06/2020   Procedure: MITRAL VALVE REPAIR;  Surgeon: Sherren Mocha, MD;  Location: [place] [facility];  Service: Cardiovascular;  Laterality: N/A;   RIGHT/LEFT HEART CATH AND CORONARY ANGIOGRAPHY N/A 09/16/2020   No CAD. Procedure: RIGHT/LEFT HEART CATH AND CORONARY ANGIOGRAPHY;  Surgeon: Belva Crome, MD;  Location: [place] [facility];  Service: Cardiovascular;  Laterality: N/A;   TEE WITHOUT CARDIOVERSION N/A 09/16/2020   Procedure: TRANSESOPHAGEAL ECHOCARDIOGRAM (TEE);  Surgeon: Buford Dresser, MD;  Location: [facility] ENDOSCOPY;  Service: Cardiovascular;  Laterality: N/A;   TEE WITHOUT CARDIOVERSION N/A 11/06/2020   Procedure: TRANSESOPHAGEAL ECHOCARDIOGRAM (TEE);  Surgeon: Sherren Mocha, MD;  Location: [place] [facility];  Service: Cardiovascular;  Laterality: N/A;   TEE WITHOUT CARDIOVERSION N/A 06/16/2022   Procedure: TRANSESOPHAGEAL ECHOCARDIOGRAM (TEE);  Surgeon: Sanda Klein, MD;  Location: [facility] ENDOSCOPY;  Service: Cardiovascular;  Laterality: N/A;   TONSILLECTOMY     age 79   TOTAL KNEE ARTHROPLASTY Left 06/02/2021   Procedure: LEFT TOTAL KNEE ARTHROPLASTY;  Surgeon: Renette Butters, MD;  Location: [facility];  Service: Orthopedics;  Laterality: Left;   TRANSTHORACIC ECHOCARDIOGRAM  07/03/2020; 10/28/20; 12/03/20   06/2020 TTE  EF 60-65%, grd II DD, severe pulm art HTN, severe mitral valve regurg, mod/sev tricuspic valve regurg. Conf on TEE 08/2020.  10/28/20 (s/p MV clip procedure) mild-mod MVR, EF 55-60%. 11/2020 EF 60%, mild/mod MR w/mean grad 78m hg and mod to sev TR.    Outpatient Medications Prior to Visit  Medication Sig Dispense Refill   Ascorbic Acid (VITAMIN C PO) Take 1,000 mg by mouth in the morning.     azelastine (ASTELIN) 0.1 % nasal spray Place 2 sprays into both nostrils 2 (two) times daily. 30 mL 12   carvedilol (COREG) 25 MG tablet Take 25 mg by mouth daily.     cholecalciferol (VITAMIN D) 25 MCG (1000 UNIT) tablet Take  1,000 Units by mouth in the morning.     Cyanocobalamin (VITAMIN B-12 PO) Take 1 tablet by mouth in the morning.     furosemide (LASIX) 20 MG tablet Take 1 tablet (20 mg total) by mouth daily. 90 tablet 3   ibandronate (BONIVA) 150 MG tablet Take 1 tablet (150 mg total) by mouth every 30 (thirty) days. Take in the morning with a full glass of water, on an empty stomach, and do not take anything else by mouth or lie down for the next 30 min. 3 tablet 3   ibuprofen (ADVIL) 200 MG tablet Take 400 mg by mouth every 8 (eight) hours as needed (pain.).     Multiple Vitamin (MULTIVITAMIN WITH MINERALS) TABS tablet Take 1 tablet by mouth in the morning.     Multiple Vitamins-Minerals (PRESERVISION AREDS PO) Take 1 tablet by mouth in the morning and at bedtime.      nitroGLYCERIN (NITRODUR - DOSED IN MG/24 HR) 0.2 mg/hr patch 1/2 patch over L Achilles tendon bid 30 patch 12   omeprazole (PRILOSEC) 20 MG capsule Take 20 mg by mouth daily.     potassium chloride (KLOR-CON) 10 MEQ tablet Take 10 mEq by mouth daily as needed (with furosemide (fluid retention)).     sodium chloride (MURO 128) 5 % ophthalmic solution Place 1 drop into both eyes at bedtime as needed (dry/irritated eyes.).     vitamin E 180 MG (400 UNITS) capsule Take 400 Units by mouth in the morning.     No facility-administered medications prior to visit.    Allergies  Allergen Reactions   Barbiturates Nausea And Vomiting   Demerol Nausea And Vomiting   Doxycycline Nausea Only   Hydrochlorothiazide Other (See Comments)    unknown   Irbesartan     HA   Losartan Potassium     hair loss   Olmesartan Medoxomil     hair loss   Telmisartan     cramps    ROS As per HPI  PE:    07/26/2022    3:22 PM 07/26/2022   10:49 AM 06/18/2022   10:07 AM  Vitals with BMI  Height _0  _1  _2   Weight 116 lbs 10 oz 156 lbs 116 lbs  BMI 20.66 [phone] Systolic [id number] Diastolic 70 48 80  Pulse 61 58 55     Physical  Exam  Gen: Alert, well appearing.  Patient is oriented to person, place, time, and situation. AFFECT: pleasant, lucid thought and speech. No further exam today  LABS:  Last CBC Lab Results  Component Value Date   WBC 6.9 06/07/2022   HGB 11.9 06/07/2022   HCT 35.4 06/07/2022   MCV 84 06/07/2022   MCH 28.3 06/07/2022  RDW 15.3 06/07/2022   PLT 261 44/46/1901   Last metabolic panel Lab Results  Component Value Date   GLUCOSE 94 06/07/2022   NA 146 (H) 06/07/2022   K 4.2 06/07/2022   CL 107 (H) 06/07/2022   CO2 23 06/07/2022   BUN 15 06/07/2022   CREATININE 0.68 06/07/2022   EGFR 85 06/07/2022   CALCIUM 8.5 (L) 06/07/2022   PROT 6.5 07/22/2021   ALBUMIN 4.0 07/22/2021   BILITOT 0.7 07/22/2021   ALKPHOS 69 07/22/2021   AST 12 07/22/2021   ALT 6 07/22/2021   ANIONGAP 6 07/03/2021   IMPRESSION AND PLAN:  Acute cystitis. Pt unable to provide urine sample today. Bactrim DS 1 bid x 5d. Return to give urine sample if sx's not improved with abx.  Pt has rescheduled her mitral valve surgery for next month.  An After Visit Summary was printed and given to the patient.  FOLLOW UP: Return if symptoms worsen or fail to improve.  Signed:  Crissie Sickles, MD           07/26/2022

## 2022-07-26 NOTE — Progress Notes (Signed)
Cardiology Office Note:    Date:  07/26/2022   ID:  PALMA BUSTER, DOB 1935/10/24, MRN [id number]  PCP:  Tammi Sou, MD   [place] Providers Cardiologist:  Sinclair Grooms, MD     Referring MD: Tammi Sou, MD   Chief Complaint  Patient presents with   Shortness of Breath    History of Present Illness:    Erin [name] is a 87 y.o. female with a hx of severe symptomatic mitral regurgitation, presenting for follow-up evaluation. She underwent transcatheter edge-to-edge repair of the mitral valve in January 2022 for treatment of severe primary mitral regurgitation with prolapse and flail of the posterior leaflet.  She initially had an acceptable result with mild to moderate residual mitral regurgitation, but a follow-up echocardiogram earlier this year showed recurrence of at least moderately severe mitral regurgitation with a new flail segment adjacent to the clip.  After her recent evaluation, she was referred for transesophageal echo to assess her treatment options.  She was scheduled to come in today for preoperative assessment as we had planned on performing another transcatheter edge-to-edge repair with a MitraClip.  Our plan was to deploy the MitraClip lateral to the initial clip where there is a residual flail segment.  However, the patient comes in today and is concerned about her procedure this week because she has an upper respiratory infection.  Her husband returned from travel and had symptoms of a URI.  She has developed sinus congestion and cough.  She states that she does not feel well.  She is scheduled to see her primary care physician this afternoon.  States that last time she had symptoms like that she was treated with Augmentin improved within a week.  She also complains of symptoms of a urinary tract infection with frequency and dysuria.  Her shortness of breath is essentially unchanged.  She denies orthopnea, PND, or chest pain.   Past Medical  History:  Diagnosis Date   Achilles tendon rupture    left, no surgery required   Chronic diastolic heart failure ([place])    Gross hematuria 12/2020   Klebsiella on urine clx x2 but no UTI sx's.  CT showed tiny stones in each renal pelvis but no ureteral or bladder stones, no mass.  Urol did cysto->normal. Plan is annual UA, rpt hematuria w/u in 3-5 yrs if persistent pos.   History of kidney stones    Hypertension    Hypertensive retinopathy of both eyes    Dr. Herbert Deaner   Low back pain    spondylosis, listhesis, +scoliosis at L/S jxn   Lower extremity edema    lasix prn   OA (osteoarthritis)    Osteopenia    Osteoporosis 07/2021   07/2021 T score -4.6   S/P mitral valve clip implantation 11/06/2020   s/p TEER with one XTW MitraClip on A2P2 by Dr. Burt Knack.  DAPT w/ASA and plavix x 3 mo post procedure recommended.   Severe mitral regurgitation    2021 transthoracic echo and TEE->cardiology following.  Mitral valve clip 10/2020.  f/u echo 10/28/20 mild/mod MVR and tricusp regurg   Severe pulmonary hypertension ([place]) 2021   transth echo; moderate by TEE 40/9811   Systolic murmur 91/47/8295   severe mitral regurge, mod/severe tricuspic regurg   Tremor of right hand     Past Surgical History:  Procedure Laterality Date   APPENDECTOMY     87 yrs old   [place]  2004  ruptured disc   CHOLECYSTECTOMY     age 13   CYSTOSCOPY  02/24/2021   (for gross hematuria) ->normal.   DEXA  07/2021   2022 T score -4.6 (radius)   LUMBAR LAMINECTOMY  2004   MITRAL VALVE REPAIR N/A 11/06/2020   Procedure: MITRAL VALVE REPAIR;  Surgeon: Sherren Mocha, MD;  Location: [facility];  Service: Cardiovascular;  Laterality: N/A;   RIGHT/LEFT HEART CATH AND CORONARY ANGIOGRAPHY N/A 09/16/2020   No CAD. Procedure: RIGHT/LEFT HEART CATH AND CORONARY ANGIOGRAPHY;  Surgeon: Belva Crome, MD;  Location: [facility];  Service: Cardiovascular;  Laterality: N/A;   TEE WITHOUT CARDIOVERSION N/A  09/16/2020   Procedure: TRANSESOPHAGEAL ECHOCARDIOGRAM (TEE);  Surgeon: Buford Dresser, MD;  Location[facility] END[facility]COPY;  Service: Cardiovascular;  Laterality: N/A;   TEE WITHOUT CARDIOVERSION N/A 11/06/2020   Procedure: TRANSESOPHAGEAL ECHOCARDIOGRAM (TEE);  Surgeon: Sherren Mocha, MD;  Location: [facility];  Service: Cardiovascular;  Laterality: N/A;   TEE WITHOUT CARDIOVERSION N/A 06/16/2022   Procedure: TRANSESOPHAGEAL ECHOCARDIOGRAM (TEE);  Surgeon: Sanda Klein, MD;  Location: [facility] ENDOSCOPY;  Service: Cardiovascular;  Laterality: N/A;   TONSILLECTOMY     age 40   TOTAL KNEE ARTHROPLASTY Left 06/02/2021   Procedure: LEFT TOTAL KNEE ARTHROPLASTY;  Surgeon: Renette Butters, MD;  Location: [facility];  Service: Orthopedics;  Laterality: Left;   TRANSTHORACIC ECHOCARDIOGRAM  07/03/2020; 10/28/20; 12/03/20   06/2020 TTE EF 60-65%, grd II DD, severe pulm art HTN, severe mitral valve regurg, mod/sev tricuspic valve regurg. Conf on TEE 08/2020.  10/28/20 (s/p MV clip procedure) mild-mod MVR, EF 55-60%. 11/2020 EF 60%, mild/mod MR w/mean grad 11m hg and mod to sev TR.    Current Medications: Current Meds  Medication Sig   Ascorbic Acid (VITAMIN C PO) Take 1,000 mg by mouth in the morning.   azelastine (ASTELIN) 0.1 % nasal spray Place 2 sprays into both nostrils 2 (two) times daily.   carvedilol (COREG) 25 MG tablet Take 25 mg by mouth daily.   cholecalciferol (VITAMIN D) 25 MCG (1000 UNIT) tablet Take 1,000 Units by mouth in the morning.   Cyanocobalamin (VITAMIN B-12 PO) Take 1 tablet by mouth in the morning.   furosemide (LASIX) 20 MG tablet Take 1 tablet (20 mg total) by mouth daily.   ibandronate (BONIVA) 150 MG tablet Take 1 tablet (150 mg total) by mouth every 30 (thirty) days. Take in the morning with a full glass of water, on an empty stomach, and do not take anything else by mouth or lie down for the next 30 min.   ibuprofen (ADVIL) 200 MG tablet Take 400 mg by mouth every 8  (eight) hours as needed (pain.).   Multiple Vitamin (MULTIVITAMIN WITH MINERALS) TABS tablet Take 1 tablet by mouth in the morning.   Multiple Vitamins-Minerals (PRESERVISION AREDS PO) Take 1 tablet by mouth in the morning and at bedtime.    nitroGLYCERIN (NITRODUR - DOSED IN MG/24 HR) 0.2 mg/hr patch 1/2 patch over L Achilles tendon bid   omeprazole (PRILOSEC) 20 MG capsule Take 20 mg by mouth daily.   potassium chloride (KLOR-CON) 10 MEQ tablet Take 10 mEq by mouth daily as needed (with furosemide (fluid retention)).   sodium chloride (MURO 128) 5 % ophthalmic solution Place 1 drop into both eyes at bedtime as needed (dry/irritated eyes.).   vitamin E 180 MG (400 UNITS) capsule Take 400 Units by mouth in the morning.     Allergies:   Barbiturates,  Demerol, Doxycycline, Hydrochlorothiazide, Irbesartan, Losartan potassium, Olmesartan medoxomil, and Telmisartan   Social History   Socioeconomic History   Marital status: Married    Spouse name: Not on file   Number of children: Not on file   Years of education: Not on file   Highest education level: Not on file  Occupational History   Not on file  Tobacco Use   Smoking status: Never   Smokeless tobacco: Never  Vaping Use   Vaping Use: Never used  Substance and Sexual Activity   Alcohol use: No   Drug use: Never   Sexual activity: Not Currently  Other Topics Concern   Not on file  Social History Narrative   Married, 6 children, about 33 GC.  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   No tob.   No alc.   Social Determinants of Health   Financial Resource Strain: Low Risk  (07/19/2021)   Overall Financial Resource Strain (CARDIA)    Difficulty of Paying Living Expenses: Not hard at all  Food Insecurity: No Food Insecurity (07/19/2021)   Hunger Vital Sign    Worried About Running Out of Food in the Last Year: Never true    Ran Out of Food in the Last Year: Never  true  Transportation Needs: No Transportation Needs (07/19/2021)   PRAPARE - Hydrologist (Medical): No    Lack of Transportation (Non-Medical): No  Physical Activity: Sufficiently Active (07/19/2021)   Exercise Vital Sign    Days of Exercise per Week: 7 days    Minutes of Exercise per Session: 30 min  Stress: No Stress Concern Present (07/19/2021)   [place]    Feeling of Stress : Not at all  Social Connections: [place] (07/19/2021)   Social Connection and Isolation Panel [NHANES]    Frequency of Communication with Friends and Family: More than three times a week    Frequency of Social Gatherings with Friends and Family: More than three times a week    Attends Religious Services: More than 4 times per year    Active Member of [employer] or Organizations: Yes    Attends Music therapist: More than 4 times per year    Marital Status: Married     Family History: The patient's family history includes Cancer in her father; Early death in her father; Hypertension in her mother and another family member; Liver cancer in her father. There is no history of Colon cancer or Rectal cancer.  ROS:   Please see the history of present illness.    All other systems reviewed and are negative.  EKGs/Labs/Other Studies Reviewed:    The following studies were reviewed today: Transesophageal echo 06/16/2022: 1. Left ventricular ejection fraction, by estimation, is 60 to 65%. The  left ventricle has normal function. The left ventricle has no regional  wall motion abnormalities. Left ventricular diastolic function could not  be evaluated.   2. Right ventricular systolic function is normal. The right ventricular  size is normal. There is moderately elevated pulmonary artery systolic  pressure. The estimated right ventricular systolic pressure is 71.6 mmHg.   3. Left atrial size was severely  dilated. No left atrial/left atrial  appendage thrombus was detected.   4. Right atrial size was mildly dilated.   5. A well seated MitraClip XTW is  firmly attached at A2-P2, with good  tissue bridge. Immediately lateral to the clip is a small segment of flail  posterior leaflet (still at P2). Effective regurgitant orifice area is  0.56 cmsq, regurgitant volume is 97  mL, regurgitant fraction 69%. There is complete blunting of systolic  forward flow in the right pulmonary veins, but there is no flow reversal.  Diastolic dominant flow in the left pulmonary veins. Vena contracta 3 mm.  Cumulative area of neo-mitral orifice   by planimetry is 3.46 cm sq by planimetry, 3.20 cm sq by PHT. The mitral  valve is myxomatous. Moderate to severe mitral valve regurgitation. The  mean mitral valve gradient is 4.3 mmHg with average heart rate of 66 bpm.  There is a Mitra-Clip present in  the mitral position. Procedure Date: 11/06/20.   6. Tricuspid valve regurgitation is moderate.   7. The aortic valve is tricuspid. Aortic valve regurgitation is not  visualized. No aortic stenosis is present.   8. Evidence of atrial level shunting detected by color flow Doppler.  There is a small secundum atrial septal defect with exclusively left to  right shunting across the atrial septum.   EKG:  EKG is not ordered today.    Recent Labs: 06/07/2022: BUN 15; Creatinine, Ser 0.68; Hemoglobin 11.9; Platelets 261; Potassium 4.2; Sodium 146  Recent Lipid Panel    Component Value Date/Time   CHOL 139 07/02/2020 1057   TRIG 30.0 07/02/2020 1057   TRIG 33 09/21/2006 1115   HDL 80.30 07/02/2020 1057   CHOLHDL 2 07/02/2020 1057   VLDL 6.0 07/02/2020 1057   LDLCALC 53 07/02/2020 1057     Risk Assessment/Calculations:                Physical Exam:    VS:  BP (!) 122/48   Pulse (!) 58   Ht '5\' 3"'$  (1.6 m)   Wt 156 lb (70.8 kg)   SpO2 98%   BMI 27.63 kg/m     Wt Readings from Last 3 Encounters:  07/26/22  156 lb (70.8 kg)  06/18/22 116 lb (52.6 kg)  06/16/22 118 lb 9.6 oz (53.8 kg)     GEN: Elderly woman in no acute distress HEENT: Normal NECK: No JVD; No carotid bruits LYMPHATICS: No lymphadenopathy CARDIAC: RRR, 3/6 holosystolic murmur loudest at the left lower sternal border RESPIRATORY:  Clear to auscultation without rales, wheezing or rhonchi  ABDOMEN: Soft, non-tender, non-distended MUSCULOSKELETAL:  No edema; No deformity  SKIN: Warm and dry NEUROLOGIC:  Alert and oriented x 3 PSYCHIATRIC:  Normal affect   ASSESSMENT:    1. Severe mitral insufficiency    PLAN:    In order of problems listed above:  The patient has severe primary mitral regurgitation with New York Heart Association functional class III symptoms of exertional dyspnea and fatigue.  She has undergone transcatheter edge-to-edge repair of the mitral valve with MitraClip device and has developed a new flail segment lateral to the device associated with severe eccentric MR.  We have had multiple office visits and extensive discussions about treatment options.  Her transvalvular gradients are borderline, but could be partly driven by high flow with severe MR.  After review of all potential treatment options, we plan to proceed with transcatheter edge-to-edge repair with plans to place in a clip just lateral to the original clip.  We may use a smaller NT clip to see if this gives [place] an acceptable transvalvular gradient post procedure.  The patient prefers to  delay her procedure until she is over her respiratory infection.  We will tentatively schedule her for Thursday, October 26.  She will see her primary care physician today and undergo evaluation for her upper respiratory infection and urinary tract symptoms.  Risks, indications, and alternatives to MitraClip have been reviewed with the patient at length.  She understands that the risks include but are not limited to vascular injury, infection, bleeding, arrhythmia, stroke,  myocardial infarction, cardiac injury, perforation, cardiac tamponade, need for pericardiocentesis, need for emergency surgery, device embolization, single leaflet device attachment, and death.  She understands these risks occur at a low frequency of less than 1 to 2% in total.           Medication Adjustments/Labs and Tests Ordered: Current medicines are reviewed at length with the patient today.  Concerns regarding medicines are outlined above.  No orders of the defined types were placed in this encounter.  No orders of the defined types were placed in this encounter.   Patient Instructions  Medication Instructions:  Your physician recommends that you continue on your current medications as directed. Please refer to the Current Medication list given to you today.  *If you need a refill on your cardiac medications before your next appointment, please call your pharmacy*   Lab Work: NONE If you have labs (blood work) drawn today and your tests are completely normal, you will receive your results only by: [place] (if you have MyChart) OR A paper copy in the mail If you have any lab test that is abnormal or we need to change your treatment, we will call you to review the results.   Testing/Procedures: NONE-Clip procedure moved to 08/19/22   Follow-Up: At [facility], you and your health needs are our priority.  As part of our continuing mission to provide you with exceptional heart care, we have created designated Provider Care Teams.  These Care Teams include your primary Cardiologist (physician) and Advanced Practice Providers (APPs -  Physician Assistants and Nurse Practitioners) who all work together to provide you with the care you need, when you need it.  Your next appointment:   Structural Team will-follow  The format for your next appointment:   In Person  Provider:   Sherren Mocha, MD    Important Information About Sugar          Signed, Sherren Mocha, MD  07/26/2022 11:29 AM    [place]

## 2022-07-26 NOTE — Patient Instructions (Signed)
Medication Instructions:  Your physician recommends that you continue on your current medications as directed. Please refer to the Current Medication list given to you today.  *If you need a refill on your cardiac medications before your next appointment, please call your pharmacy*   Lab Work: NONE If you have labs (blood work) drawn today and your tests are completely normal, you will receive your results only by: [place] (if you have MyChart) OR A paper copy in the mail If you have any lab test that is abnormal or we need to change your treatment, we will call you to review the results.   Testing/Procedures: NONE-Clip procedure moved to 08/19/22   Follow-Up: At [facility], you and your health needs are our priority.  As part of our continuing mission to provide you with exceptional heart care, we have created designated Provider Care Teams.  These Care Teams include your primary Cardiologist (physician) and Advanced Practice Providers (APPs -  Physician Assistants and Nurse Practitioners) who all work together to provide you with the care you need, when you need it.  Your next appointment:   Structural Team will-follow  The format for your next appointment:   In Person  Provider:   Sherren Mocha, MD    Important Information About Sugar

## 2022-07-27 ENCOUNTER — Other Ambulatory Visit: Payer: Self-pay | Admitting: Family Medicine

## 2022-07-29 DIAGNOSIS — I340 Nonrheumatic mitral (valve) insufficiency: Secondary | ICD-10-CM

## 2022-08-09 ENCOUNTER — Telehealth: Payer: Self-pay

## 2022-08-09 MED ORDER — AMOXICILLIN 500 MG PO TABS: 2000.0000 mg | ORAL_TABLET | ORAL | 8 refills | Status: DC

## 2022-08-09 NOTE — Addendum Note (Signed)
Addended by: Harland German A on: 08/09/2022 03:53 PM   Modules accepted: Orders

## 2022-08-09 NOTE — Telephone Encounter (Signed)
Noted. Thanks for letting me know.

## 2022-08-09 NOTE — Telephone Encounter (Signed)
Called to confirm PAT appointment for MitraClip procedure on 10/26.   The patient continues to have sinus congestion and cough as reported to Dr. Burt Knack earlier this month (and the reason for delaying MitraClip from 10/5). She is following with Dr. Anitra Lauth. She ultimately decided she does not want to pursue a second MitraClip at this time.  Reviewed symptoms with patient and she understands to call if she has increased SOB, swelling, fatigue.  Rescheduled the patient's echo and visit with Dr. Burt Knack until 11/08/22 per her request.  She was grateful for assistance.

## 2022-08-16 ENCOUNTER — Other Ambulatory Visit (HOSPITAL_COMMUNITY): Payer: Medicare Other

## 2022-08-19 ENCOUNTER — Encounter (HOSPITAL_COMMUNITY): Admission: RE | Payer: Self-pay | Source: Home / Self Care

## 2022-08-19 ENCOUNTER — Inpatient Hospital Stay (HOSPITAL_COMMUNITY): Admission: RE | Admit: 2022-08-19 | Payer: Medicare Other | Source: Home / Self Care | Admitting: Cardiovascular Disease

## 2022-08-19 DIAGNOSIS — I340 Nonrheumatic mitral (valve) insufficiency: Secondary | ICD-10-CM

## 2022-08-19 SURGERY — MITRAL VALVE REPAIR
Anesthesia: General

## 2022-09-06 ENCOUNTER — Telehealth: Payer: Self-pay | Admitting: Family Medicine

## 2022-09-06 NOTE — Telephone Encounter (Signed)
Attempted to schedule AWV. Unable to LVM.  Will try at later time.  Mail box full 

## 2022-09-15 ENCOUNTER — Ambulatory Visit: Payer: Medicare Other

## 2022-09-29 ENCOUNTER — Encounter (INDEPENDENT_AMBULATORY_CARE_PROVIDER_SITE_OTHER): Payer: Medicare Other

## 2022-09-29 NOTE — Patient Instructions (Signed)

## 2022-09-29 NOTE — Progress Notes (Deleted)
Subjective:   Erin [name] is a 87 y.o. female who presents for Medicare Annual (Subsequent) preventive examination.  Review of Systems    ***       Objective:    There were no vitals filed for this visit. There is no height or weight on file to calculate BMI.     06/16/2022   10:11 AM 07/19/2021   10:15 AM 07/03/2021    5:10 PM 05/22/2021    9:34 AM 11/07/2020   11:08 AM 11/04/2020    1:34 PM 11/04/2020   12:25 PM  Advanced Directives  Does Patient Have a Medical Advance Directive? No No No No No No No  Would patient like information on creating a medical advance directive? No - Patient declined Yes (MAU/Ambulatory/Procedural Areas - Information given)  Yes (Inpatient - patient defers creating a medical advance directive and declines information at this time) No - Patient declined Yes (MAU/Ambulatory/Procedural Areas - Information given) No - Patient declined    Current Medications (verified) Outpatient Encounter Medications as of 09/29/2022  Medication Sig   amoxicillin (AMOXIL) 500 MG tablet Take 4 tablets (2,000 mg total) by mouth as directed. Take 4 tablets (2,000 mg total) by mouth 1 hour prior to all dental visits.   Ascorbic Acid (VITAMIN C PO) Take 1,000 mg by mouth in the morning.   azelastine (ASTELIN) 0.1 % nasal spray Place 2 sprays into both nostrils 2 (two) times daily.   carvedilol (COREG) 25 MG tablet Take 25 mg by mouth daily.   cholecalciferol (VITAMIN D) 25 MCG (1000 UNIT) tablet Take 1,000 Units by mouth in the morning.   Cyanocobalamin (VITAMIN B-12 PO) Take 1 tablet by mouth in the morning.   furosemide (LASIX) 20 MG tablet Take 1 tablet (20 mg total) by mouth daily.   ibandronate (BONIVA) 150 MG tablet Take 1 tablet (150 mg total) by mouth every 30 (thirty) days. Take in the morning with a full glass of water, on an empty stomach, and do not take anything else by mouth or lie down for the next 30 min.   ibuprofen (ADVIL) 200 MG tablet Take 400 mg by mouth  every 8 (eight) hours as needed (pain.).   Multiple Vitamin (MULTIVITAMIN WITH MINERALS) TABS tablet Take 1 tablet by mouth in the morning.   Multiple Vitamins-Minerals (PRESERVISION AREDS PO) Take 1 tablet by mouth in the morning and at bedtime.    nitroGLYCERIN (NITRODUR - DOSED IN MG/24 HR) 0.2 mg/hr patch 1/2 patch over L Achilles tendon bid   omeprazole (PRILOSEC) 20 MG capsule Take 20 mg by mouth daily.   potassium chloride (KLOR-CON) 10 MEQ tablet Take 10 mEq by mouth daily as needed (with furosemide (fluid retention)).   sodium chloride (MURO 128) 5 % ophthalmic solution Place 1 drop into both eyes at bedtime as needed (dry/irritated eyes.).   sulfamethoxazole-trimethoprim (BACTRIM DS) 800-160 MG tablet Take 1 tablet by mouth 2 (two) times daily.   vitamin E 180 MG (400 UNITS) capsule Take 400 Units by mouth in the morning.   No facility-administered encounter medications on file as of 09/29/2022.    Allergies (verified) Barbiturates, Demerol, Doxycycline, Hydrochlorothiazide, Irbesartan, Losartan potassium, Olmesartan medoxomil, and Telmisartan   History: Past Medical History:  Diagnosis Date   Achilles tendon rupture    left, no surgery required   Chronic diastolic heart failure ([place])    Gross hematuria 12/2020   Klebsiella on urine clx x2 but no UTI sx's.  CT showed tiny stones  in each renal pelvis but no ureteral or bladder stones, no mass.  Urol did cysto->normal. Plan is annual UA, rpt hematuria w/u in 3-5 yrs if persistent pos.   History of kidney stones    Hypertension    Hypertensive retinopathy of both eyes    Dr. Herbert Deaner   Low back pain    spondylosis, listhesis, +scoliosis at L/S jxn   Lower extremity edema    lasix prn   OA (osteoarthritis)    Osteopenia    Osteoporosis 07/2021   07/2021 T score -4.6   S/P mitral valve clip implantation 11/06/2020   s/p TEER with one XTW MitraClip on A2P2 by Dr. Burt Knack.  DAPT w/ASA and plavix x 3 mo post procedure recommended.    Severe mitral regurgitation    2021 transthoracic echo and TEE->cardiology following.  Mitral valve clip 10/2020.  f/u echo 10/28/20 mild/mod MVR and tricusp regurg   Severe pulmonary hypertension ([place]) 2021   transth echo; moderate by TEE 16/1096   Systolic murmur 04/54/0981   severe mitral regurge, mod/severe tricuspic regurg   Tremor of right hand    Past Surgical History:  Procedure Laterality Date   APPENDECTOMY     87 yrs old   [place]  2004   ruptured disc   CHOLECYSTECTOMY     age 18   CYSTOSCOPY  02/24/2021   (for gross hematuria) ->normal.   DEXA  07/2021   2022 T score -4.6 (radius)   LUMBAR LAMINECTOMY  2004   MITRAL VALVE REPAIR N/A 11/06/2020   Procedure: MITRAL VALVE REPAIR;  Surgeon: Sherren Mocha, MD;  Location: [facility];  Service: Cardiovascular;  Laterality: N/A;   RIGHT/LEFT HEART CATH AND CORONARY ANGIOGRAPHY N/A 09/16/2020   No CAD. Procedure: RIGHT/LEFT HEART CATH AND CORONARY ANGIOGRAPHY;  Surgeon: Belva Crome, MD;  Location: [facility];  Service: Cardiovascular;  Laterality: N/A;   TEE WITHOUT CARDIOVERSION N/A 09/16/2020   Procedure: TRANSESOPHAGEAL ECHOCARDIOGRAM (TEE);  Surgeon: Buford Dresser, MD;  Location: [facility] ENDOSCOPY;  Service: Cardiovascular;  Laterality: N/A;   TEE WITHOUT CARDIOVERSION N/A 11/06/2020   Procedure: TRANSESOPHAGEAL ECHOCARDIOGRAM (TEE);  Surgeon: Sherren Mocha, MD;  Location: [facility];  Service: Cardiovascular;  Laterality: N/A;   TEE WITHOUT CARDIOVERSION N/A 06/16/2022   Procedure: TRANSESOPHAGEAL ECHOCARDIOGRAM (TEE);  Surgeon: Sanda Klein, MD;  Location: [facility] ENDOSCOPY;  Service: Cardiovascular;  Laterality: N/A;   TONSILLECTOMY     age 18   TOTAL KNEE ARTHROPLASTY Left 06/02/2021   Procedure: LEFT TOTAL KNEE ARTHROPLASTY;  Surgeon: Renette Butters, MD;  Location: [facility];  Service: Orthopedics;  Laterality: Left;   TRANSTHORACIC ECHOCARDIOGRAM  07/03/2020; 10/28/20; 12/03/20    06/2020 TTE EF 60-65%, grd II DD, severe pulm art HTN, severe mitral valve regurg, mod/sev tricuspic valve regurg. Conf on TEE 08/2020.  10/28/20 (s/p MV clip procedure) mild-mod MVR, EF 55-60%. 11/2020 EF 60%, mild/mod MR w/mean grad 36m hg and mod to sev TR.   Family History  Problem Relation Age of Onset   Hypertension Mother    Early death Father    Cancer Father    Liver cancer Father    Hypertension Other    Colon cancer Neg Hx    Rectal cancer Neg Hx    Social History   Socioeconomic History   Marital status: Married    Spouse name: Not on file   Number of children: Not on file   Years of education: Not on file  Highest education level: Not on file  Occupational History   Not on file  Tobacco Use   Smoking status: Never   Smokeless tobacco: Never  Vaping Use   Vaping Use: Never used  Substance and Sexual Activity   Alcohol use: No   Drug use: Never   Sexual activity: Not Currently  Other Topics Concern   Not on file  Social History Narrative   Married, 6 children, about 21 GC.  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   No tob.   No alc.   Social Determinants of Health   Financial Resource Strain: Low Risk  (07/19/2021)   Overall Financial Resource Strain (CARDIA)    Difficulty of Paying Living Expenses: Not hard at all  Food Insecurity: No Food Insecurity (07/19/2021)   Hunger Vital Sign    Worried About Running Out of Food in the Last Year: Never true    Ran Out of Food in the Last Year: Never true  Transportation Needs: No Transportation Needs (07/19/2021)   PRAPARE - Hydrologist (Medical): No    Lack of Transportation (Non-Medical): No  Physical Activity: Sufficiently Active (07/19/2021)   Exercise Vital Sign    Days of Exercise per Week: 7 days    Minutes of Exercise per Session: 30 min  Stress: No Stress Concern Present (07/19/2021)   [place]    Feeling of Stress : Not at all  Social Connections: [place] (07/19/2021)   Social Connection and Isolation Panel [NHANES]    Frequency of Communication with Friends and Family: More than three times a week    Frequency of Social Gatherings with Friends and Family: More than three times a week    Attends Religious Services: More than 4 times per year    Active Member of [employer] or Organizations: Yes    Attends Music therapist: More than 4 times per year    Marital Status: Married    Tobacco Counseling Counseling given: Not Answered   Clinical Intake:                 Diabetic?***         Activities of Daily Living     No data to display          Patient Care Team: Tammi Sou, MD as PCP - General (Family Medicine) Belva Crome, MD as PCP - Cardiology (Cardiology) Kristeen Miss, MD as Consulting Physician (Neurosurgery) Renette Butters, MD as Consulting Physician (Orthopedic Surgery) Tanda Rockers, MD as Consulting Physician (Pulmonary Disease) Monna Fam, MD as Consulting Physician (Ophthalmology) Belva Crome, MD as Consulting Physician (Cardiology) Sherren Mocha, MD as Consulting Physician (Cardiology) Janith Lima, MD as Consulting Physician (Urology)  Indicate any recent Medical Services you may have received from other than [facility] providers in the past year (date may be approximate).     Assessment:   This is a routine wellness examination for Erin [name].  Hearing/Vision screen No results found.  Dietary issues and exercise activities discussed:     Goals Addressed   None    Depression Screen    09/02/2021    2:38 PM 07/19/2021   10:16 AM 01/14/2021   10:29 AM 07/02/2020   10:09 AM 01/11/2018    1:12 PM 09/22/2016    2:30 PM  PHQ 2/9 Scores  PHQ - 2 Score 0 0 0 0 0 0    Fall Risk    09/02/2021    2:38 PM 07/19/2021   10:15 AM 01/14/2021    10:29 AM 07/02/2020   10:09 AM 01/11/2018    1:12 PM  Fall Risk   Falls in the past year? 0 0 0 0 No  Number falls in past yr: 0 0 0 0   Injury with Fall? 0 0 0 0   Risk for fall due to :  History of fall(s)     Follow up Falls evaluation completed Falls evaluation completed Falls evaluation completed Falls evaluation completed     [place]:  Any stairs in or around the home? {YES/NO:21197} If so, are there any without handrails? {YES/NO:21197} Home free of loose throw rugs in walkways, pet beds, electrical cords, etc? {YES/NO:21197} Adequate lighting in your home to reduce risk of falls? {YES/NO:21197}  ASSISTIVE DEVICES UTILIZED TO PREVENT FALLS:  Life alert? {YES/NO:21197} Use of a cane, walker or w/c? {YES/NO:21197} Grab bars in the bathroom? {YES/NO:21197} Shower chair or bench in shower? {YES/NO:21197} Elevated toilet seat or a handicapped toilet? {YES/NO:21197}  TIMED UP AND GO:  Was the test performed? {YES/NO:21197}.  Length of time to ambulate 10 feet: *** sec.     Cognitive Function:        07/19/2021   10:18 AM  6CIT Screen  What Year? 0 points  What month? 0 points  What time? 0 points  Count back from 20 2 points  Months in reverse 0 points  Repeat phrase 0 points  Total Score 2 points    Immunizations Immunization History  Administered Date(s) Administered   Pneumococcal Conjugate-13 03/24/2017   Pneumococcal Polysaccharide-23 05/21/2010   Td 10/11/2013    TDAP status: Up to date  Flu Vaccine status: Due, Education has been provided regarding the importance of this vaccine. Advised may receive this vaccine at local pharmacy or Health Dept. Aware to provide a copy of the vaccination record if obtained from local pharmacy or Health Dept. Verbalized acceptance and understanding.  Pneumococcal vaccine status: Up to date  Covid-19 vaccine status: Completed vaccines  Qualifies for Shingles Vaccine? Yes    Zostavax completed No   Shingrix Completed?: No.    Education has been provided regarding the importance of this vaccine. Patient has been advised to call insurance company to determine out of pocket expense if they have not yet received this vaccine. Advised may also receive vaccine at local pharmacy or Health Dept. Verbalized acceptance and understanding.  Screening Tests Health Maintenance  Topic Date Due   COVID-19 Vaccine (1) Never done   Zoster Vaccines- Shingrix (1 of 2) Never done   INFLUENZA VACCINE  01/23/2023 (Originally 05/25/2022)   Medicare Annual Wellness (AWV)  09/30/2023   DTaP/Tdap/Td (2 - Tdap) 10/12/2023   Pneumonia Vaccine 35+ Years old  Completed   DEXA SCAN  Completed   HPV VACCINES  Aged Out    Health Maintenance  Health Maintenance Due  Topic Date Due   COVID-19 Vaccine (1) Never done   Zoster Vaccines- Shingrix (1 of 2) Never done    Colorectal cancer screening: No longer required.   Mammogram status: No longer required due to age.  Bone Density status: Completed 07/30/21. Results reflect: Bone density results: OSTEOPOROSIS. Repeat every *** years.  Lung Cancer Screening: (Low Dose CT Chest recommended if Age 63-80 years, 30 pack-year currently smoking OR  have quit w/in 15years.) does not qualify.   Lung Cancer Screening Referral: n/a  Additional Screening:  Hepatitis C Screening: does not qualify; Completed N/A  Vision Screening: Recommended annual ophthalmology exams for early detection of glaucoma and other disorders of the eye. Is the patient up to date with their annual eye exam?  {YES/NO:21197} Who is the provider or what is the name of the office in which the patient attends annual eye exams? *** If pt is not established with a provider, would they like to be referred to a provider to establish care? {YES/NO:21197}.   Dental Screening: Recommended annual dental exams for proper oral hygiene  Community Resource Referral / Chronic Care  Management: CRR required this visit?  {YES/NO:21197}  CCM required this visit?  {YES/NO:21197}     Plan:     I have personally reviewed and noted the following in the patient's chart:   Medical and social history Use of alcohol, tobacco or illicit drugs  Current medications and supplements including opioid prescriptions. {Opioid Prescriptions:[phone]} Functional ability and status Nutritional status Physical activity Advanced directives List of other physicians Hospitalizations, surgeries, and ER visits in previous 12 months Vitals Screenings to include cognitive, depression, and falls Referrals and appointments  In addition, I have reviewed and discussed with patient certain preventive protocols, quality metrics, and best practice recommendations. A written personalized care plan for preventive services as well as general preventive health recommendations were provided to patient.     Beatrix Fetters, [place]   09/29/2022   Nurse Notes: ***

## 2022-09-30 NOTE — Progress Notes (Signed)
Pt did not answer.

## 2022-10-13 DIAGNOSIS — M5416 Radiculopathy, lumbar region: Secondary | ICD-10-CM | POA: Diagnosis not present

## 2022-11-01 DIAGNOSIS — M5116 Intervertebral disc disorders with radiculopathy, lumbar region: Secondary | ICD-10-CM | POA: Diagnosis not present

## 2022-11-01 DIAGNOSIS — M5416 Radiculopathy, lumbar region: Secondary | ICD-10-CM | POA: Diagnosis not present

## 2022-11-03 ENCOUNTER — Ambulatory Visit (INDEPENDENT_AMBULATORY_CARE_PROVIDER_SITE_OTHER): Payer: Medicare Other

## 2022-11-03 ENCOUNTER — Ambulatory Visit (INDEPENDENT_AMBULATORY_CARE_PROVIDER_SITE_OTHER): Payer: Medicare Other | Admitting: Family Medicine

## 2022-11-03 ENCOUNTER — Encounter: Payer: Self-pay | Admitting: Family Medicine

## 2022-11-03 VITALS — BP 142/70 | HR 59 | Temp 97.7°F | Ht 63.0 in | Wt 115.2 lb

## 2022-11-03 DIAGNOSIS — I10 Essential (primary) hypertension: Secondary | ICD-10-CM

## 2022-11-03 DIAGNOSIS — G8929 Other chronic pain: Secondary | ICD-10-CM | POA: Diagnosis not present

## 2022-11-03 DIAGNOSIS — I340 Nonrheumatic mitral (valve) insufficiency: Secondary | ICD-10-CM | POA: Diagnosis not present

## 2022-11-03 DIAGNOSIS — I5032 Chronic diastolic (congestive) heart failure: Secondary | ICD-10-CM | POA: Diagnosis not present

## 2022-11-03 DIAGNOSIS — M25561 Pain in right knee: Secondary | ICD-10-CM | POA: Diagnosis not present

## 2022-11-03 DIAGNOSIS — M25562 Pain in left knee: Secondary | ICD-10-CM

## 2022-11-03 DIAGNOSIS — Z0000 Encounter for general adult medical examination without abnormal findings: Secondary | ICD-10-CM | POA: Diagnosis not present

## 2022-11-03 LAB — BASIC METABOLIC PANEL
BUN: 22 mg/dL (ref 6–23)
CO2: 30 mEq/L (ref 19–32)
Calcium: 8.8 mg/dL (ref 8.4–10.5)
Chloride: 105 mEq/L (ref 96–112)
Creatinine, Ser: 0.66 mg/dL (ref 0.40–1.20)
GFR: 79.84 mL/min (ref >60.00)
Glucose, Bld: 97 mg/dL (ref 70–99)
Potassium: 4.5 mEq/L (ref 3.5–5.1)
Sodium: 143 mEq/L (ref 135–145)

## 2022-11-03 MED ORDER — LISINOPRIL 10 MG PO TABS: 10.0000 mg | ORAL_TABLET | Freq: Every day | ORAL | 0 refills | Status: DC

## 2022-11-03 MED ORDER — DICLOFENAC SODIUM 75 MG PO TBEC: DELAYED_RELEASE_TABLET | ORAL | 0 refills | Status: DC

## 2022-11-03 MED ORDER — OMEPRAZOLE 20 MG PO CPDR: 20.0000 mg | DELAYED_RELEASE_CAPSULE | Freq: Every day | ORAL | 1 refills | Status: DC

## 2022-11-03 MED ORDER — CARVEDILOL 25 MG PO TABS: 25.0000 mg | ORAL_TABLET | Freq: Two times a day (BID) | ORAL | 1 refills | Status: DC

## 2022-11-03 NOTE — Progress Notes (Signed)
Subjective:   Erin [name] is a 88 y.o. female who presents for Medicare Annual (Subsequent) preventive examination.  I connected with  Erin [name] on 11/03/22 by an audio only telemedicine application and verified that I am speaking with the correct person using two identifiers.   I discussed the limitations, risks, security and privacy concerns of performing an evaluation and management service by telephone and the availability of in person appointments. I also discussed with the patient that there may be a patient responsible charge related to this service. The patient expressed understanding and verbally consented to this telephonic visit.  Location of Patient: home Location of Provider: office  List any persons and their role that are participating in the visit with the patient.   [place], CMA  Review of Systems    Defer to PCP Cardiac Risk Factors include: advanced age (>14mn, >>63women)     Objective:    There were no vitals filed for this visit. There is no height or weight on file to calculate BMI.     11/03/2022    1:24 PM 06/16/2022   10:11 AM 07/19/2021   10:15 AM 07/03/2021    5:10 PM 05/22/2021    9:34 AM 11/07/2020   11:08 AM 11/04/2020    1:34 PM  Advanced Directives  Does Patient Have a Medical Advance Directive? No No No No No No No  Would patient like information on creating a medical advance directive? No - Patient declined No - Patient declined Yes (MAU/Ambulatory/Procedural Areas - Information given)  Yes (Inpatient - patient defers creating a medical advance directive and declines information at this time) No - Patient declined Yes (MAU/Ambulatory/Procedural Areas - Information given)    Current Medications (verified) Outpatient Encounter Medications as of 11/03/2022  Medication Sig   amoxicillin (AMOXIL) 500 MG tablet Take 4 tablets (2,000 mg total) by mouth as directed. Take 4 tablets (2,000 mg total) by mouth 1 hour prior to  all dental visits. (Patient not taking: Reported on 11/03/2022)   Ascorbic Acid (VITAMIN C PO) Take 1,000 mg by mouth in the morning.   azelastine (ASTELIN) 0.1 % nasal spray Place 2 sprays into both nostrils 2 (two) times daily.   carvedilol (COREG) 25 MG tablet Take 1 tablet (25 mg total) by mouth in the morning and at bedtime.   cholecalciferol (VITAMIN D) 25 MCG (1000 UNIT) tablet Take 1,000 Units by mouth in the morning.   Cyanocobalamin (VITAMIN B-12 PO) Take 1 tablet by mouth in the morning.   diclofenac (VOLTAREN) 75 MG EC tablet 1 tab po bid prn back and knee pain   furosemide (LASIX) 20 MG tablet Take 1 tablet (20 mg total) by mouth daily.   ibandronate (BONIVA) 150 MG tablet Take 1 tablet (150 mg total) by mouth every 30 (thirty) days. Take in the morning with a full glass of water, on an empty stomach, and do not take anything else by mouth or lie down for the next 30 min.   lisinopril (ZESTRIL) 10 MG tablet Take 1 tablet (10 mg total) by mouth daily.   Multiple Vitamin (MULTIVITAMIN WITH MINERALS) TABS tablet Take 1 tablet by mouth in the morning.   Multiple Vitamins-Minerals (PRESERVISION AREDS PO) Take 1 tablet by mouth in the morning and at bedtime.    nitroGLYCERIN (NITRODUR - DOSED IN MG/24 HR) 0.2 mg/hr patch 1/2 patch over L Achilles tendon bid (Patient not taking: Reported on 11/03/2022)   omeprazole (PRILOSEC) 20  MG capsule Take 1 capsule (20 mg total) by mouth daily.   potassium chloride (KLOR-CON) 10 MEQ tablet Take 10 mEq by mouth daily as needed (with furosemide (fluid retention)).   sodium chloride (MURO 128) 5 % ophthalmic solution Place 1 drop into both eyes at bedtime as needed (dry/irritated eyes.).   vitamin E 180 MG (400 UNITS) capsule Take 400 Units by mouth in the morning.   No facility-administered encounter medications on file as of 11/03/2022.    Allergies (verified) Barbiturates, Demerol, Doxycycline, Hydrochlorothiazide, Irbesartan, Losartan potassium,  Olmesartan medoxomil, and Telmisartan   History: Past Medical History:  Diagnosis Date   Achilles tendon rupture    left, no surgery required   Chronic diastolic heart failure ([place])    Gross hematuria 12/2020   Klebsiella on urine clx x2 but no UTI sx's.  CT showed tiny stones in each renal pelvis but no ureteral or bladder stones, no mass.  Urol did cysto->normal. Plan is annual UA, rpt hematuria w/u in 3-5 yrs if persistent pos.   History of kidney stones    Hypertension    Hypertensive retinopathy of both eyes    Dr. Herbert Deaner   Low back pain    spondylosis, listhesis, +scoliosis at L/S jxn   Lower extremity edema    lasix prn   OA (osteoarthritis)    Osteopenia    Osteoporosis 07/2021   07/2021 T score -4.6   S/P mitral valve clip implantation 11/06/2020   s/p TEER with one XTW MitraClip on A2P2 by Dr. Burt Knack.  DAPT w/ASA and plavix x 3 mo post procedure recommended.   Severe mitral regurgitation    2021 transthoracic echo and TEE->cardiology following.  Mitral valve clip 10/2020.  f/u echo 10/28/20 mild/mod MVR and tricusp regurg   Severe pulmonary hypertension ([place]) 2021   transth echo; moderate by TEE 37/9024   Systolic murmur 09/73/5329   severe mitral regurge, mod/severe tricuspic regurg   Tremor of right hand    Past Surgical History:  Procedure Laterality Date   APPENDECTOMY     88 yrs old   [place]  2004   ruptured disc   CHOLECYSTECTOMY     age 41   CYSTOSCOPY  02/24/2021   (for gross hematuria) ->normal.   DEXA  07/2021   2022 T score -4.6 (radius)   LUMBAR LAMINECTOMY  2004   MITRAL VALVE REPAIR N/A 11/06/2020   Procedure: MITRAL VALVE REPAIR;  Surgeon: Sherren Mocha, MD;  Location: [facility];  Service: Cardiovascular;  Laterality: N/A;   RIGHT/LEFT HEART CATH AND CORONARY ANGIOGRAPHY N/A 09/16/2020   No CAD. Procedure: RIGHT/LEFT HEART CATH AND CORONARY ANGIOGRAPHY;  Surgeon: Belva Crome, MD;  Location: [place] [facility];  Service:  Cardiovascular;  Laterality: N/A;   TEE WITHOUT CARDIOVERSION N/A 09/16/2020   Procedure: TRANSESOPHAGEAL ECHOCARDIOGRAM (TEE);  Surgeon: Buford Dresser, MD;  Location: [facility] ENDOS[facility];  Service: Cardiovascular;  Laterality: N/A;   TEE WITHOUT CARDIOVERSION N/A 11/06/2020   Procedure: TRANSESOPHAGEAL ECHOCARDIOGRAM (TEE);  Surgeon: Sherren Mocha, MD;  Location: [place] [facility];  Service: Cardiovascular;  Laterality: N/A;   TEE WITHOUT CARDIOVERSION N/A 06/16/2022   Procedure: TRANSESOPHAGEAL ECHOCARDIOGRAM (TEE);  Surgeon: Sanda Klein, MD;  Location: [facility] ENDOSCOPY;  Service: Cardiovascular;  Laterality: N/A;   TONSILLECTOMY     age 88   TOTAL KNEE ARTHROPLASTY Left 06/02/2021   Procedure: LEFT TOTAL KNEE ARTHROPLASTY;  Surgeon: Renette Butters, MD;  Location: [facility];  Service: Orthopedics;  Laterality: Left;  TRANSTHORACIC ECHOCARDIOGRAM  07/03/2020; 10/28/20; 12/03/20   06/2020 TTE EF 60-65%, grd II DD, severe pulm art HTN, severe mitral valve regurg, mod/sev tricuspic valve regurg. Conf on TEE 08/2020.  10/28/20 (s/p MV clip procedure) mild-mod MVR, EF 55-60%. 11/2020 EF 60%, mild/mod MR w/mean grad 56m hg and mod to sev TR.   Family History  Problem Relation Age of Onset   Hypertension Mother    Early death Father    Cancer Father    Liver cancer Father    Hypertension Other    Colon cancer Neg Hx    Rectal cancer Neg Hx    Social History   Socioeconomic History   Marital status: Married    Spouse name: Not on file   Number of children: Not on file   Years of education: Not on file   Highest education level: Not on file  Occupational History   Not on file  Tobacco Use   Smoking status: Never   Smokeless tobacco: Never  Vaping Use   Vaping Use: Never used  Substance and Sexual Activity   Alcohol use: No   Drug use: Never   Sexual activity: Not Currently  Other Topics Concern   Not on file  Social History Narrative   Married, 6 children, about 143GC.  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   No alc.   Social Determinants of Health   Financial Resource Strain: Low Risk  (11/03/2022)   Overall Financial Resource Strain (CARDIA)    Difficulty of Paying Living Expenses: Not hard at all  Food Insecurity: No Food Insecurity (11/03/2022)   Hunger Vital Sign    Worried About Running Out of Food in the Last Year: Never true    Ran Out of Food in the Last Year: Never true  Transportation Needs: No Transportation Needs (11/03/2022)   PRAPARE - THydrologist(Medical): No    Lack of Transportation (Non-Medical): No  Physical Activity: Insufficiently Active (11/03/2022)   Exercise Vital Sign    Days of Exercise per Week: 7 days    Minutes of Exercise per Session: 20 min  Stress: No Stress Concern Present (11/03/2022)   [place]   Feeling of Stress : Not at all  Social Connections: [place](11/03/2022)   Social Connection and Isolation Panel [NHANES]    Frequency of Communication with Friends and Family: More than three times a week    Frequency of Social Gatherings with Friends and Family: More than three times a week    Attends Religious Services: More than 4 times per year    Active Member of [employer] Organizations: Yes    Attends CMusic therapist More than 4 times per year    Marital Status: Married    Tobacco Counseling Counseling given: Not Answered   Clinical Intake:  Pre-visit preparation completed: No  Pain : No/denies pain     Nutritional Risks: None Diabetes: No  How often do you need to have someone help you when you read instructions, pamphlets, or other written materials from your doctor or pharmacy?: 1 - Never What is the last grade level you completed in school?: some college  Diabetic?no         Activities of Daily  Living    11/03/2022  1:21 PM  In your present state of health, do you have any difficulty performing the following activities:  Hearing? 0  Vision? 0  Difficulty concentrating or making decisions? 0  Walking or climbing stairs? 0  Dressing or bathing? 0  Doing errands, shopping? 0  Preparing Food and eating ? N  Using the Toilet? N  In the past six months, have you accidently leaked urine? N  Do you have problems with loss of bowel control? N  Managing your Medications? N  Managing your Finances? N  Housekeeping or managing your Housekeeping? N    Patient Care Team: Tammi Sou, MD as PCP - General (Family Medicine) Belva Crome, MD as PCP - Cardiology (Cardiology) Kristeen Miss, MD as Consulting Physician (Neurosurgery) Renette Butters, MD as Consulting Physician (Orthopedic Surgery) Tanda Rockers, MD as Consulting Physician (Pulmonary Disease) Monna Fam, MD as Consulting Physician (Ophthalmology) Belva Crome, MD as Consulting Physician (Cardiology) Sherren Mocha, MD as Consulting Physician (Cardiology) Janith Lima, MD as Consulting Physician (Urology)  Indicate any recent Medical Services you may have received from other than [facility] providers in the past year (date may be approximate).     Assessment:   This is a routine wellness examination for [place].  Hearing/Vision screen No results found.  Dietary issues and exercise activities discussed: Exercise limited by: None identified   Goals Addressed             This Visit's Progress    Quality of Life Maintained       Evidence-based guidance:  Assess patient's thoughts about quality of life, goals and expectations, and dissatisfaction or desire to improve.  Identify issues of primary importance such as mental health, illness, exercise tolerance, pain, sexual function and intimacy, cognitive change, social isolation, finances and relationships.  Assess and monitor for signs/symptoms of  psychosocial concerns, especially depression or ideations regarding harm to others or self; provide or refer for mental health services as needed.  Identify sensory issues that impact quality of life such as hearing loss, vision deficit; strategize ways to maintain or improve hearing, vision.  Promote access to services in the community to support independence such as support groups, home visiting programs, financial assistance, handicapped parking tags, durable medical equipment and emergency responder.  Promote activities to decrease social isolation such as group support or social, leisure and recreational activities, employment, use of social media; consider safety concerns about being out of home for activities.  Provide patient an opportunity to share by storytelling or a "life review" to give positive meaning to life and to assist with coping and negative experiences.  Encourage patient to tap into hope to improve sense of self.  Counsel based on prognosis and as early as possible about end-of-life and palliative care; consider referral to palliative care provider.  Advocate for the development of palliative care plan that may include avoidance of unnecessary testing and intervention, symptom control, discontinuation of medications, hospice and organ donation.  Counsel as early as possible those with life-limiting chronic disease about palliative care; consider referral to palliative care provider.  Advocate for the development of palliative care plan.   Notes:       Depression Screen    11/03/2022    1:17 PM 11/03/2022   10:42 AM 09/02/2021    2:38 PM 07/19/2021   10:16 AM 01/14/2021   10:29 AM 07/02/2020   10:09 AM 01/11/2018    1:12 PM  PHQ 2/9 Scores  PHQ - 2  Score 0 0 0 0 0 0 0    Fall Risk    11/03/2022    1:24 PM 11/03/2022   10:42 AM 09/02/2021    2:38 PM 07/19/2021   10:15 AM 01/14/2021   10:29 AM  Fall Risk   Falls in the past year? 0 0 0 0 0  Number falls in past yr: 0 0 0 0  0  Injury with Fall? 0 0 0 0 0  Risk for fall due to :  No Fall Risks  History of fall(s)   Follow up Falls evaluation completed Falls evaluation completed Falls evaluation completed Falls evaluation completed Falls evaluation completed    [place]:  Any stairs in or around the home? No  If so, are there any without handrails? No  Home free of loose throw rugs in walkways, pet beds, electrical cords, etc? Yes  Adequate lighting in your home to reduce risk of falls? Yes   ASSISTIVE DEVICES UTILIZED TO PREVENT FALLS:  Life alert? No  Use of a cane, walker or w/c? No  Grab bars in the bathroom? No  Shower chair or bench in shower? Yes  Elevated toilet seat or a handicapped toilet? Yes   TIMED UP AND GO:  Was the test performed? No .  Length of time to ambulate 10 feet: n/a sec.    Cognitive Function:        11/03/2022    1:27 PM 07/19/2021   10:18 AM  6CIT Screen  What Year? 0 points 0 points  What month? 0 points 0 points  What time? 0 points 0 points  Count back from 20 0 points 2 points  Months in reverse 0 points 0 points  Repeat phrase 0 points 0 points  Total Score 0 points 2 points    Immunizations Immunization History  Administered Date(s) Administered   Pneumococcal Conjugate-13 03/24/2017   Pneumococcal Polysaccharide-23 05/21/2010   Td 10/11/2013    TDAP status: Up to date  Flu Vaccine status: Declined, Education has been provided regarding the importance of this vaccine but patient still declined. Advised may receive this vaccine at local pharmacy or Health Dept. Aware to provide a copy of the vaccination record if obtained from local pharmacy or Health Dept. Verbalized acceptance and understanding.  Pneumococcal vaccine status: Up to date  Covid-19 vaccine status: Completed vaccines  Qualifies for Shingles Vaccine? Yes   Zostavax completed No   Shingrix Completed?: No.    Education has been provided regarding the  importance of this vaccine. Patient has been advised to call insurance company to determine out of pocket expense if they have not yet received this vaccine. Advised may also receive vaccine at local pharmacy or Health Dept. Verbalized acceptance and understanding.  Screening Tests Health Maintenance  Topic Date Due   COVID-19 Vaccine (1) 11/19/2022 (Originally 08/25/1935)   INFLUENZA VACCINE  01/23/2023 (Originally 05/25/2022)   Zoster Vaccines- Shingrix (1 of 2) 02/02/2023 (Originally [birth year]/1987)   DTaP/Tdap/Td (2 - Tdap) 10/12/2023   Medicare Annual Wellness (AWV)  11/04/2023   Pneumonia Vaccine 20+ Years old  Completed   DEXA SCAN  Completed   HPV VACCINES  Aged Out    Health Maintenance  There are no preventive care reminders to display for this patient.   Colorectal cancer screening: No longer required.   Mammogram status: No longer required due to age.  Bone Density status: Completed 07/30/2021. Results reflect: Bone density results: OSTEOPOROSIS. Repeat every 2  years.  Lung Cancer Screening: (Low Dose CT Chest recommended if Age 76-80 years, 30 pack-year currently smoking OR have quit w/in 15years.) does not qualify.   Lung Cancer Screening Referral: n/a  Additional Screening:  Hepatitis C Screening: does not qualify; Completed n/a  Vision Screening: Recommended annual ophthalmology exams for early detection of glaucoma and other disorders of the eye. Is the patient up to date with their annual eye exam?  Yes  Who is the provider or what is the name of the office in which the patient attends annual eye exams? Dr Herbert Deaner If pt is not established with a provider, would they like to be referred to a provider to establish care? No .   Dental Screening: Recommended annual dental exams for proper oral hygiene  Community Resource Referral / Chronic Care Management: CRR required this visit?  No   CCM required this visit?  No      Plan:     I have personally reviewed and  noted the following in the patient's chart:   Medical and social history Use of alcohol, tobacco or illicit drugs  Current medications and supplements including opioid prescriptions. Patient is not currently taking opioid prescriptions. Functional ability and status Nutritional status Physical activity Advanced directives List of other physicians Hospitalizations, surgeries, and ER visits in previous 12 months Vitals Screenings to include cognitive, depression, and falls Referrals and appointments  In addition, I have reviewed and discussed with patient certain preventive protocols, quality metrics, and best practice recommendations. A written personalized care plan for preventive services as well as general preventive health recommendations were provided to patient.     Beatrix Fetters, [place]   11/03/2022   Nurse Notes: Non-Face to Face or Face to Face 8 minute visit Encounter    Ms. Celia , Thank you for taking time to come for your Medicare Wellness Visit. I appreciate your ongoing commitment to your health goals. Please review the following plan we discussed and let me know if I can assist you in the future.   These are the goals we discussed:  Goals      Quality of Life Maintained     Evidence-based guidance:  Assess patient's thoughts about quality of life, goals and expectations, and dissatisfaction or desire to improve.  Identify issues of primary importance such as mental health, illness, exercise tolerance, pain, sexual function and intimacy, cognitive change, social isolation, finances and relationships.  Assess and monitor for signs/symptoms of psychosocial concerns, especially depression or ideations regarding harm to others or self; provide or refer for mental health services as needed.  Identify sensory issues that impact quality of life such as hearing loss, vision deficit; strategize ways to maintain or improve hearing, vision.  Promote access to services in the  community to support independence such as support groups, home visiting programs, financial assistance, handicapped parking tags, durable medical equipment and emergency responder.  Promote activities to decrease social isolation such as group support or social, leisure and recreational activities, employment, use of social media; consider safety concerns about being out of home for activities.  Provide patient an opportunity to share by storytelling or a "life review" to give positive meaning to life and to assist with coping and negative experiences.  Encourage patient to tap into hope to improve sense of self.  Counsel based on prognosis and as early as possible about end-of-life and palliative care; consider referral to palliative care provider.  Advocate for the development of palliative care plan that  may include avoidance of unnecessary testing and intervention, symptom control, discontinuation of medications, hospice and organ donation.  Counsel as early as possible those with life-limiting chronic disease about palliative care; consider referral to palliative care provider.  Advocate for the development of palliative care plan.   Notes:         This is a list of the screening recommended for you and due dates:  Health Maintenance  Topic Date Due   COVID-19 Vaccine (1) 11/19/2022*   Flu Shot  01/23/2023*   Zoster (Shingles) Vaccine (1 of 2) 02/02/2023*   DTaP/Tdap/Td vaccine (2 - Tdap) 10/12/2023   Medicare Annual Wellness Visit  11/04/2023   Pneumonia Vaccine  Completed   DEXA scan (bone density measurement)  Completed   HPV Vaccine  Aged Out  *Topic was postponed. The date shown is not the original due date.

## 2022-11-03 NOTE — Patient Instructions (Signed)

## 2022-11-03 NOTE — Progress Notes (Signed)
OFFICE VISIT  11/03/2022  CC:  Chief Complaint  Patient presents with   Medical Management of Chronic Issues    Pt is fasting    Patient is a 88 y.o. female who presents for follow-up hypertension and chronic diastolic heart failure.  INTERIM HX: Jennet says she feels well. Only checks blood pressure at home occasionally, the only numbers she recalls around 140s.  She denies any dizziness, chest pain, or shortness of breath. Admittedly, her ambulation is limited by back and knee pain. She got a back injection yesterday. Her twin sister takes diclofenac for her musculoskeletal pain and states that it helps her very well.  Patient would like to try this if possible.  No lower extremity edema.  She has Lasix to take as needed but has not used this in quite a while.  She does not take her potassium unless she takes Lasix.  Her weight is stable at 115 pounds.  ROS as above, plus--> no fevers, no CP, no SOB, no wheezing, no cough, no dizziness, no HAs, no rashes, no melena/hematochezia.  No polyuria or polydipsia.  No myalgias or arthralgias.  No focal weakness or paresthesias.  No acute vision or hearing abnormalities.  No dysuria or unusual/new urinary urgency or frequency.  No recent changes in lower legs. No n/v/d or abd pain.  No palpitations.     Past Medical History:  Diagnosis Date   Achilles tendon rupture    left, no surgery required   Chronic diastolic heart failure ([place])    Gross hematuria 12/2020   Klebsiella on urine clx x2 but no UTI sx's.  CT showed tiny stones in each renal pelvis but no ureteral or bladder stones, no mass.  Urol did cysto->normal. Plan is annual UA, rpt hematuria w/u in 3-5 yrs if persistent pos.   History of kidney stones    Hypertension    Hypertensive retinopathy of both eyes    Dr. Herbert Deaner   Low back pain    spondylosis, listhesis, +scoliosis at L/S jxn   Lower extremity edema    lasix prn   OA (osteoarthritis)    Osteopenia    Osteoporosis  07/2021   07/2021 T score -4.6   S/P mitral valve clip implantation 11/06/2020   s/p TEER with one XTW MitraClip on A2P2 by Dr. Burt Knack.  DAPT w/ASA and plavix x 3 mo post procedure recommended.   Severe mitral regurgitation    2021 transthoracic echo and TEE->cardiology following.  Mitral valve clip 10/2020.  f/u echo 10/28/20 mild/mod MVR and tricusp regurg   Severe pulmonary hypertension ([place]) 2021   transth echo; moderate by TEE 88/5027   Systolic murmur 74/09/8785   severe mitral regurge, mod/severe tricuspic regurg   Tremor of right hand     Past Surgical History:  Procedure Laterality Date   APPENDECTOMY     88 yrs old   [place]  2004   ruptured disc   CHOLECYSTECTOMY     age 22   CYSTOSCOPY  02/24/2021   (for gross hematuria) ->normal.   DEXA  07/2021   2022 T score -4.6 (radius)   LUMBAR LAMINECTOMY  2004   MITRAL VALVE REPAIR N/A 11/06/2020   Procedure: MITRAL VALVE REPAIR;  Surgeon: Sherren Mocha, MD;  Location: [facility];  Service: Cardiovascular;  Laterality: N/A;   RIGHT/LEFT HEART CATH AND CORONARY ANGIOGRAPHY N/A 09/16/2020   No CAD. Procedure: RIGHT/LEFT HEART CATH AND CORONARY ANGIOGRAPHY;  Surgeon: Belva Crome, MD;  Location:  [facility];  Service: Cardiovascular;  Laterality: N/A;   TEE WITHOUT CARDIOVERSION N/A 09/16/2020   Procedure: TRANSESOPHAGEAL ECHOCARDIOGRAM (TEE);  Surgeon: Buford Dresser, MD;  Location: [facility];  Service: Cardiovascular;  Laterality: N/A;   TEE WITHOUT CARDIOVERSION N/A 11/06/2020   Procedure: TRANSESOPHAGEAL ECHOCARDIOGRAM (TEE);  Surgeon: Sherren Mocha, MD;  Location: [facility];  Service: Cardiovascular;  Laterality: N/A;   TEE WITHOUT CARDIOVERSION N/A 06/16/2022   Procedure: TRANSESOPHAGEAL ECHOCARDIOGRAM (TEE);  Surgeon: Sanda Klein, MD;  Location: [facility];  Service: Cardiovascular;  Laterality: N/A;   TONSILLECTOMY     age 46   TOTAL KNEE ARTHROPLASTY Left 06/02/2021    Procedure: LEFT TOTAL KNEE ARTHROPLASTY;  Surgeon: Renette Butters, MD;  Location: [facility];  Service: Orthopedics;  Laterality: Left;   TRANSTHORACIC ECHOCARDIOGRAM  07/03/2020; 10/28/20; 12/03/20   06/2020 TTE EF 60-65%, grd II DD, severe pulm art HTN, severe mitral valve regurg, mod/sev tricuspic valve regurg. Conf on TEE 08/2020.  10/28/20 (s/p MV clip procedure) mild-mod MVR, EF 55-60%. 11/2020 EF 60%, mild/mod MR w/mean grad 16m hg and mod to sev TR.    Outpatient Medications Prior to Visit  Medication Sig Dispense Refill   Ascorbic Acid (VITAMIN C PO) Take 1,000 mg by mouth in the morning.     azelastine (ASTELIN) 0.1 % nasal spray Place 2 sprays into both nostrils 2 (two) times daily. 30 mL 12   cholecalciferol (VITAMIN D) 25 MCG (1000 UNIT) tablet Take 1,000 Units by mouth in the morning.     Cyanocobalamin (VITAMIN B-12 PO) Take 1 tablet by mouth in the morning.     furosemide (LASIX) 20 MG tablet Take 1 tablet (20 mg total) by mouth daily. 90 tablet 3   ibandronate (BONIVA) 150 MG tablet Take 1 tablet (150 mg total) by mouth every 30 (thirty) days. Take in the morning with a full glass of water, on an empty stomach, and do not take anything else by mouth or lie down for the next 30 min. 3 tablet 3   Multiple Vitamin (MULTIVITAMIN WITH MINERALS) TABS tablet Take 1 tablet by mouth in the morning.     Multiple Vitamins-Minerals (PRESERVISION AREDS PO) Take 1 tablet by mouth in the morning and at bedtime.      potassium chloride (KLOR-CON) 10 MEQ tablet Take 10 mEq by mouth daily as needed (with furosemide (fluid retention)).     sodium chloride (MURO 128) 5 % ophthalmic solution Place 1 drop into both eyes at bedtime as needed (dry/irritated eyes.).     vitamin E 180 MG (400 UNITS) capsule Take 400 Units by mouth in the morning.     carvedilol (COREG) 25 MG tablet Take 25 mg by mouth daily.     ibuprofen (ADVIL) 200 MG tablet Take 400 mg by mouth every 8 (eight) hours as needed (pain.).      omeprazole (PRILOSEC) 20 MG capsule Take 20 mg by mouth daily.     amoxicillin (AMOXIL) 500 MG tablet Take 4 tablets (2,000 mg total) by mouth as directed. Take 4 tablets (2,000 mg total) by mouth 1 hour prior to all dental visits. (Patient not taking: Reported on 11/03/2022) 8 tablet 8   nitroGLYCERIN (NITRODUR - DOSED IN MG/24 HR) 0.2 mg/hr patch 1/2 patch over L Achilles tendon bid (Patient not taking: Reported on 11/03/2022) 30 patch 12   sulfamethoxazole-trimethoprim (BACTRIM DS) 800-160 MG tablet Take 1 tablet by mouth 2 (two) times daily. 10 tablet 0   No  facility-administered medications prior to visit.    Allergies  Allergen Reactions   Barbiturates Nausea And Vomiting   Demerol Nausea And Vomiting   Doxycycline Nausea Only   Hydrochlorothiazide Other (See Comments)    unknown   Irbesartan     HA   Losartan Potassium     hair loss   Olmesartan Medoxomil     hair loss   Telmisartan     cramps    Review of Systems As per HPI  PE:    11/03/2022   10:34 AM 07/26/2022    3:22 PM 07/26/2022   10:49 AM  Vitals with BMI  Height '5\' 3"'$  '5\' 3"'$  '5\' 3"'$   Weight 115 lbs 3 oz 116 lbs 10 oz 156 lbs  BMI 20.41 33.29 51.88  Systolic 416 606 301  Diastolic 92 70 48  Pulse 59 61 58   Physical Exam  Gen: Alert, well appearing.  Patient is oriented to person, place, time, and situation. AFFECT: pleasant, lucid thought and speech. Cardiovascular: Regular rhythm and rate, 4 out of 6 systolic murmur, S1 obscured, S2 normal, no diastolic murmur.  No rub or gallop. Lungs are clear bilaterally, breathing is nonlabored. Extremities show no edema.  LABS:  Last CBC Lab Results  Component Value Date   WBC 6.9 06/07/2022   HGB 11.9 06/07/2022   HCT 35.4 06/07/2022   MCV 84 06/07/2022   MCH 28.3 06/07/2022   RDW 15.3 06/07/2022   PLT 261 60/07/9322   Last metabolic panel Lab Results  Component Value Date   GLUCOSE 94 06/07/2022   NA 146 (H) 06/07/2022   K 4.2 06/07/2022   CL 107  (H) 06/07/2022   CO2 23 06/07/2022   BUN 15 06/07/2022   CREATININE 0.68 06/07/2022   EGFR 85 06/07/2022   CALCIUM 8.5 (L) 06/07/2022   PROT 6.5 07/22/2021   ALBUMIN 4.0 07/22/2021   BILITOT 0.7 07/22/2021   ALKPHOS 69 07/22/2021   AST 12 07/22/2021   ALT 6 07/22/2021   ANIONGAP 6 07/03/2021   Last lipids Lab Results  Component Value Date   CHOL 139 07/02/2020   HDL 80.30 07/02/2020   LDLCALC 53 07/02/2020   TRIG 30.0 07/02/2020   CHOLHDL 2 07/02/2020   Last thyroid functions Lab Results  Component Value Date   TSH 2.37 07/02/2020   IMPRESSION AND PLAN:  #1 hypertension, uncontrolled. Add lisinopril 10 mg a day.  Continue carvedilol 25 mg twice daily. Electrolytes and creatinine today.  2.  Chronic diastolic heart failure. Her fluid balance is very good--not on any daily diuretic.  She has this to take as needed. Continue carvedilol. We will continue our efforts to obtain better blood pressure control.  #3 chronic low back pain and bilateral osteoarthritic knee pain. Has responded to as needed ibuprofen some in the past. I think is okay to try diclofenac 75 mg twice daily as needed.  Stop ibuprofen.  #4 severe mitral regurgitation.  History of mitral clip placement but it appears that this is leaking. There had been plans to get a repeat clip procedure with Dr. Burt Knack. She is hesitant to get this done. Hard to assess symptomatology from her valvular insufficiency because her activity level is so limited by her musculoskeletal pain.  An After Visit Summary was printed and given to the patient.  FOLLOW UP: Return for 7-10d f/u HTN.  Signed:  Crissie Sickles, MD           11/03/2022

## 2022-11-08 ENCOUNTER — Encounter: Payer: Self-pay | Admitting: Cardiovascular Disease

## 2022-11-08 ENCOUNTER — Ambulatory Visit (HOSPITAL_BASED_OUTPATIENT_CLINIC_OR_DEPARTMENT_OTHER): Payer: Medicare Other

## 2022-11-08 ENCOUNTER — Ambulatory Visit (HOSPITAL_COMMUNITY): Payer: Medicare Other | Attending: Cardiovascular Disease | Admitting: Cardiovascular Disease

## 2022-11-08 VITALS — BP 122/58 | HR 56 | Ht 63.0 in | Wt 115.0 lb

## 2022-11-08 DIAGNOSIS — I340 Nonrheumatic mitral (valve) insufficiency: Secondary | ICD-10-CM

## 2022-11-08 LAB — ECHOCARDIOGRAM COMPLETE
Area-P 1/2: 2.87 cm2
MV M vel: 4.35 m/s
MV Peak grad: 75.7 mmHg
MV VTI: 0.94 cm2
Radius: 1.1 cm
S' Lateral: 2.8 cm

## 2022-11-08 NOTE — Progress Notes (Signed)
Cardiology Office Note:    Date:  11/08/2022   ID:  Erin [name], DOB 02/21/1935, MRN [id number]  PCP:  Erin Sou, MD   [facility] Providers Cardiologist:  Erin Grooms, MD     Referring MD: Erin Sou, MD   Chief Complaint  Patient presents with   Shortness of Breath    History of Present Illness:    Erin [name] is a 88 y.o. female with a hx of severe degenerative mitral regurgitation who underwent transcatheter edge-to-edge repair of the mitral valve in January 2022.  The patient presents for follow-up evaluation.  The mechanism of her mitral regurgitation was felt to be prolapse and flail of the posterior leaflet.  Initially, she had an acceptable result with mild to moderate residual mitral regurgitation.  However, on follow-up imaging she was demonstrated to have severe mitral regurgitation confirmed by transesophageal echo assessment.  The patient was evaluated in follow-up with recommendations for repeat transcatheter edge-to-edge repair of the mitral valve.  She was initially scheduled but she canceled the procedure.  She has been reluctant to proceed.  She returns today for follow-up evaluation.  The patient is here alone today.  She continues to have some shortness of breath when she lies down on her back.  She is able to sleep on her side without problems and does not need to prop up on pillows.  She has no edema in her legs or abdomen.  No PND.  She is most limited in her physical activity with knee and back problems.  She reports occasional chest pain.  Past Medical History:  Diagnosis Date   Achilles tendon rupture    left, no surgery required   Chronic diastolic heart failure ([place])    Gross hematuria 12/2020   Klebsiella on urine clx x2 but no UTI sx's.  CT showed tiny stones in each renal pelvis but no ureteral or bladder stones, no mass.  Urol did cysto->normal. Plan is annual UA, rpt hematuria w/u in 3-5 yrs if persistent pos.    History of kidney stones    Hypertension    Hypertensive retinopathy of both eyes    Dr. Herbert [name]   Low back pain    spondylosis, listhesis, +scoliosis at L/S jxn   Lower extremity edema    lasix prn   OA (osteoarthritis)    Osteopenia    Osteoporosis 07/2021   07/2021 T score -4.6   S/P mitral valve clip implantation 11/06/2020   s/p TEER with one XTW MitraClip on A2P2 by Dr. Burt Knack.  DAPT w/ASA and plavix x 3 mo post procedure recommended.   Severe mitral regurgitation    2021 transthoracic echo and TEE->cardiology following.  Mitral valve clip 10/2020.  f/u echo 10/28/20 mild/mod MVR and tricusp regurg   Severe pulmonary hypertension ([place]) 2021   transth echo; moderate by TEE 00/7622   Systolic murmur 63/33/5456   severe mitral regurge, mod/severe tricuspic regurg   Tremor of right hand     Past Surgical History:  Procedure Laterality Date   APPENDECTOMY     88 yrs old   Erin [name]  2004   ruptured disc   CHOLECYSTECTOMY     age 30   CYSTOSCOPY  02/24/2021   (for gross hematuria) ->normal.   DEXA  07/2021   2022 T score -4.6 (radius)   LUMBAR LAMINECTOMY  2004   MITRAL VALVE REPAIR N/A 11/06/2020   Procedure: MITRAL VALVE REPAIR;  Surgeon: Sherren Mocha,  MD;  Location: [place] [facility];  Service: Cardiovascular;  Laterality: N/A;   RIGHT/LEFT HEART CATH AND CORONARY ANGIOGRAPHY N/A 09/16/2020   No CAD. Procedure: RIGHT/LEFT HEART CATH AND CORONARY ANGIOGRAPHY;  Surgeon: Belva Crome, MD;  Location: [place] [facility];  Service: Cardiovascular;  Laterality: N/A;   TEE WITHOUT CARDIOVERSION N/A 09/16/2020   Procedure: TRANSESOPHAGEAL ECHOCARDIOGRAM (TEE);  Surgeon: Buford Dresser, MD;  Location[facility];  Service: Cardiovascular;  Laterality: N/A;   TEE WITHOUT CARDIOVERSION N/A 11/06/2020   Procedure: TRANSESOPHAGEAL ECHOCARDIOGRAM (TEE);  Surgeon: Sherren Mocha, MD;  Location: [place] [facility];  Service: Cardiovascular;  Laterality: N/A;   TEE WITHOUT  CARDIOVERSION N/A 06/16/2022   Procedure: TRANSESOPHAGEAL ECHOCARDIOGRAM (TEE);  Surgeon: Sanda Klein, MD;  Location[facility];  Service: Cardiovascular;  Laterality: N/A;   TONSILLECTOMY     age 9   TOTAL KNEE ARTHROPLASTY Left 06/02/2021   Procedure: LEFT TOTAL KNEE ARTHROPLASTY;  Surgeon: Renette Butters, MD;  Location: [facility];  Service: Orthopedics;  Laterality: Left;   TRANSTHORACIC ECHOCARDIOGRAM  07/03/2020; 10/28/20; 12/03/20   06/2020 TTE EF 60-65%, grd II DD, severe pulm art HTN, severe mitral valve regurg, mod/sev tricuspic valve regurg. Conf on TEE 08/2020.  10/28/20 (s/p MV clip procedure) mild-mod MVR, EF 55-60%. 11/2020 EF 60%, mild/mod MR w/mean grad 42m hg and mod to sev TR.    Current Medications: Current Meds  Medication Sig   amoxicillin (AMOXIL) 500 MG tablet Take 4 tablets (2,000 mg total) by mouth as directed. Take 4 tablets (2,000 mg total) by mouth 1 hour prior to all dental visits.   Ascorbic Acid (VITAMIN C PO) Take 1,000 mg by mouth in the morning.   azelastine (ASTELIN) 0.1 % nasal spray Place 2 sprays into both nostrils 2 (two) times daily.   carvedilol (COREG) 25 MG tablet Take 1 tablet (25 mg total) by mouth in the morning and at bedtime.   cholecalciferol (VITAMIN D) 25 MCG (1000 UNIT) tablet Take 1,000 Units by mouth in the morning.   Cyanocobalamin (VITAMIN B-12 PO) Take 1 tablet by mouth in the morning.   diclofenac (VOLTAREN) 75 MG EC tablet 1 tab po bid prn back and knee pain   furosemide (LASIX) 20 MG tablet Take 1 tablet (20 mg total) by mouth daily.   ibandronate (BONIVA) 150 MG tablet Take 1 tablet (150 mg total) by mouth every 30 (thirty) days. Take in the morning with a full glass of water, on an empty stomach, and do not take anything else by mouth or lie down for the next 30 min.   lisinopril (ZESTRIL) 10 MG tablet Take 1 tablet (10 mg total) by mouth daily.   Multiple Vitamin (MULTIVITAMIN WITH MINERALS) TABS tablet Take 1 tablet by mouth in the  morning.   Multiple Vitamins-Minerals (PRESERVISION AREDS PO) Take 1 tablet by mouth in the morning and at bedtime.    nitroGLYCERIN (NITRODUR - DOSED IN MG/24 HR) 0.2 mg/hr patch 1/2 patch over L Achilles tendon bid (Patient taking differently: as needed (chest pain). 1/2 patch over L Achilles tendon bid)   omeprazole (PRILOSEC) 20 MG capsule Take 1 capsule (20 mg total) by mouth daily. (Patient taking differently: Take 20 mg by mouth as needed (Heartburn).)   potassium chloride (KLOR-CON) 10 MEQ tablet Take 10 mEq by mouth daily as needed (with furosemide (fluid retention)).   sodium chloride (MURO 128) 5 % ophthalmic solution Place 1 drop into both eyes at bedtime as needed (dry/irritated eyes.).  vitamin E 180 MG (400 UNITS) capsule Take 400 Units by mouth in the morning.     Allergies:   Barbiturates, Demerol, Doxycycline, Hydrochlorothiazide, Irbesartan, Losartan potassium, Olmesartan medoxomil, and Telmisartan   Social History   Socioeconomic History   Marital status: Married    Spouse name: Not on file   Number of children: Not on file   Years of education: Not on file   Highest education level: Not on file  Occupational History   Not on file  Tobacco Use   Smoking status: Never   Smokeless tobacco: Never  Vaping Use   Vaping Use: Never used  Substance and Sexual Activity   Alcohol use: No   Drug use: Never   Sexual activity: Not Currently  Other Topics Concern   Not on file  Social History Narrative   Married, 6 children, about 2 GC.  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   No tob.   No alc.   Social Determinants of Health   Financial Resource Strain: Low Risk  (11/03/2022)   Overall Financial Resource Strain (CARDIA)    Difficulty of Paying Living Expenses: Not hard at all  Food Insecurity: No Food Insecurity (11/03/2022)   Hunger Vital Sign    Worried About Running Out of Food in the Last  Year: Never true    Ran Out of Food in the Last Year: Never true  Transportation Needs: No Transportation Needs (11/03/2022)   PRAPARE - Hydrologist (Medical): No    Lack of Transportation (Non-Medical): No  Physical Activity: Insufficiently Active (11/03/2022)   Exercise Vital Sign    Days of Exercise per Week: 7 days    Minutes of Exercise per Session: 20 min  Stress: No Stress Concern Present (11/03/2022)   [place]    Feeling of Stress : Not at all  Social Connections: [place] (11/03/2022)   Social Connection and Isolation Panel [NHANES]    Frequency of Communication with Friends and Family: More than three times a week    Frequency of Social Gatherings with Friends and Family: More than three times a week    Attends Religious Services: More than 4 times per year    Active Member of [employer] or Organizations: Yes    Attends Music therapist: More than 4 times per year    Marital Status: Married     Family History: The patient's family history includes Cancer in her father; Early death in her father; Hypertension in her mother and another family member; Liver cancer in her father. There is no history of Colon cancer or Rectal cancer.  ROS:   Please see the history of present illness.    All other systems reviewed and are negative.  EKGs/Labs/Other Studies Reviewed:    The following studies were reviewed today: TEE 07/06/22: 1. Left ventricular ejection fraction, by estimation, is 60 to 65%. The  left ventricle has normal function. The left ventricle has no regional  wall motion abnormalities. Left ventricular diastolic function could not  be evaluated.   2. Right ventricular systolic function is normal. The right ventricular  size is normal. There is moderately elevated pulmonary artery systolic  pressure. The estimated right ventricular systolic pressure is  08.6 mmHg.   3. Left atrial size was severely dilated. No left atrial/left atrial  appendage thrombus was detected.   4. Right atrial size was mildly dilated.   5. A well seated MitraClip XTW is firmly attached at A2-P2, with good  tissue bridge. Immediately lateral to the clip is a small segment of flail  posterior leaflet (still at P2). Effective regurgitant orifice area is  0.56 cmsq, regurgitant volume is 97  mL, regurgitant fraction 69%. There is complete blunting of systolic  forward flow in the right pulmonary veins, but there is no flow reversal.  Diastolic dominant flow in the left pulmonary veins. Vena contracta 3 mm.  Cumulative area of neo-mitral orifice   by planimetry is 3.46 cm sq by planimetry, 3.20 cm sq by PHT. The mitral  valve is myxomatous. Moderate to severe mitral valve regurgitation. The  mean mitral valve gradient is 4.3 mmHg with average heart rate of 66 bpm.  There is a Mitra-Clip present in  the mitral position. Procedure Date: 11/06/20.   6. Tricuspid valve regurgitation is moderate.   7. The aortic valve is tricuspid. Aortic valve regurgitation is not  visualized. No aortic stenosis is present.   8. Evidence of atrial level shunting detected by color flow Doppler.  There is a small secundum atrial septal defect with exclusively left to  right shunting across the atrial septum.    EKG:  EKG is not ordered today.  Recent Labs: 06/07/2022: Hemoglobin 11.9; Platelets 261 11/03/2022: BUN 22; Creatinine, Ser 0.66; Potassium 4.5; Sodium 143  Recent Lipid Panel    Component Value Date/Time   CHOL 139 07/02/2020 1057   TRIG 30.0 07/02/2020 1057   TRIG 33 09/21/2006 1115   HDL 80.30 07/02/2020 1057   CHOLHDL 2 07/02/2020 1057   VLDL 6.0 07/02/2020 1057   LDLCALC 53 07/02/2020 1057     Risk Assessment/Calculations:                Physical Exam:    VS:  BP (!) 122/58   Pulse (!) 56   Ht '5\' 3"'$  (1.6 m)   Wt 115 lb (52.2 kg)   SpO2 98%   BMI 20.37  kg/m     Wt Readings from Last 3 Encounters:  11/08/22 115 lb (52.2 kg)  11/03/22 115 lb 3.2 oz (52.3 kg)  07/26/22 116 lb 9.6 oz (52.9 kg)     GEN:  Well nourished, well developed in no acute distress HEENT: Normal NECK: No JVD; No carotid bruits LYMPHATICS: No lymphadenopathy CARDIAC: RRR, 4/6 holosystolic murmur at the apex but heard throughout RESPIRATORY:  Clear to auscultation without rales, wheezing or rhonchi  ABDOMEN: Soft, non-tender, non-distended MUSCULOSKELETAL:  No edema; No deformity  SKIN: Warm and dry NEUROLOGIC:  Alert and oriented x 3 PSYCHIATRIC:  Normal affect   ASSESSMENT:    1. Severe mitral regurgitation    PLAN:    In order of problems listed above:  The patient has severe residual mitral regurgitation following initial transcatheter edge-to-edge repair of the mitral valve now about 2 years ago.  Discussed treatment options at length again today.  The patient is reluctant to proceed with a repeat MitraClip.  I told her I think this procedure is technically feasible and would likely reduce her mitral regurgitation and improve her quality of life.  There are associated risks and she understands this.  Because of her concern over the risks, she would not like to proceed at this time.  She will be set up for clinical follow-up in about 3 months.  I will continue her current  medical regimen without changes.  All of her questions were answered today.           Medication Adjustments/Labs and Tests Ordered: Current medicines are reviewed at length with the patient today.  Concerns regarding medicines are outlined above.  No orders of the defined types were placed in this encounter.  No orders of the defined types were placed in this encounter.   Patient Instructions  Medication Instructions:  Your physician recommends that you continue on your current medications as directed. Please refer to the Current Medication list given to you today.  *If you need  a refill on your cardiac medications before your next appointment, please call your pharmacy*   Lab Work: NONE If you have labs (blood work) drawn today and your tests are completely normal, you will receive your results only by: [place] (if you have MyChart) OR A paper copy in the mail If you have any lab test that is abnormal or we need to change your treatment, we will call you to review the results.   Testing/Procedures: NONE   Follow-Up: At [facility], you and your health needs are our priority.  As part of our continuing mission to provide you with exceptional heart care, we have created designated Provider Care Teams.  These Care Teams include your primary Cardiologist (physician) and Advanced Practice Providers (APPs -  Physician Assistants and Nurse Practitioners) who all work together to provide you with the care you need, when you need it.  We recommend signing up for the patient portal called "MyChart".  Sign up information is provided on this After Visit Summary.  MyChart is used to connect with patients for Virtual Visits (Telemedicine).  Patients are able to view lab/test results, encounter notes, upcoming appointments, etc.  Non-urgent messages can be sent to your provider as well.   To learn more about what you can do with MyChart, go to [email or web address].    Your next appointment:   3 month(s)  Provider:   Structural APP       Signed, Sherren Mocha, MD  11/08/2022 1:34 PM    [place]

## 2022-11-08 NOTE — Patient Instructions (Signed)
Medication Instructions:  Your physician recommends that you continue on your current medications as directed. Please refer to the Current Medication list given to you today.  *If you need a refill on your cardiac medications before your next appointment, please call your pharmacy*   Lab Work: NONE If you have labs (blood work) drawn today and your tests are completely normal, you will receive your results only by: [place] (if you have MyChart) OR A paper copy in the mail If you have any lab test that is abnormal or we need to change your treatment, we will call you to review the results.   Testing/Procedures: NONE   Follow-Up: At [facility], you and your health needs are our priority.  As part of our continuing mission to provide you with exceptional heart care, we have created designated Provider Care Teams.  These Care Teams include your primary Cardiologist (physician) and Advanced Practice Providers (APPs -  Physician Assistants and Nurse Practitioners) who all work together to provide you with the care you need, when you need it.  We recommend signing up for the patient portal called "MyChart".  Sign up information is provided on this After Visit Summary.  MyChart is used to connect with patients for Virtual Visits (Telemedicine).  Patients are able to view lab/test results, encounter notes, upcoming appointments, etc.  Non-urgent messages can be sent to your provider as well.   To learn more about what you can do with MyChart, go to [email or web address].    Your next appointment:   3 month(s)  Provider:   Structural APP

## 2022-11-11 ENCOUNTER — Ambulatory Visit (INDEPENDENT_AMBULATORY_CARE_PROVIDER_SITE_OTHER): Payer: Medicare Other | Admitting: Family Medicine

## 2022-11-11 ENCOUNTER — Telehealth: Payer: Self-pay | Admitting: Family Medicine

## 2022-11-11 ENCOUNTER — Encounter: Payer: Self-pay | Admitting: Family Medicine

## 2022-11-11 VITALS — BP 117/62 | HR 74 | Temp 97.5°F | Ht 63.0 in | Wt 115.6 lb

## 2022-11-11 DIAGNOSIS — Z96652 Presence of left artificial knee joint: Secondary | ICD-10-CM

## 2022-11-11 DIAGNOSIS — I10 Essential (primary) hypertension: Secondary | ICD-10-CM

## 2022-11-11 DIAGNOSIS — M79652 Pain in left thigh: Secondary | ICD-10-CM

## 2022-11-11 DIAGNOSIS — G8929 Other chronic pain: Secondary | ICD-10-CM

## 2022-11-11 LAB — BASIC METABOLIC PANEL
BUN: 27 mg/dL — ABNORMAL HIGH (ref 6–23)
CO2: 30 mEq/L (ref 19–32)
Calcium: 8.8 mg/dL (ref 8.4–10.5)
Chloride: 104 mEq/L (ref 96–112)
Creatinine, Ser: 0.69 mg/dL (ref 0.40–1.20)
GFR: 78.97 mL/min (ref >60.00)
Glucose, Bld: 98 mg/dL (ref 70–99)
Potassium: 4.1 mEq/L (ref 3.5–5.1)
Sodium: 141 mEq/L (ref 135–145)

## 2022-11-11 MED ORDER — PANTOPRAZOLE SODIUM 40 MG PO TBEC: 40.0000 mg | DELAYED_RELEASE_TABLET | Freq: Every day | ORAL | 1 refills | Status: DC

## 2022-11-11 NOTE — Progress Notes (Signed)
OFFICE VISIT  11/11/2022  CC:  Chief Complaint  Patient presents with   Hypertension    Follow up; pt is not fasting    Patient is a 88 y.o. female who presents for 1 week follow-up uncontrolled hypertension. A/P as of last visit: "#1 hypertension, uncontrolled. Add lisinopril 10 mg a day.  Continue carvedilol 25 mg twice daily. Electrolytes and creatinine today.   2.  Chronic diastolic heart failure. Her fluid balance is very good--not on any daily diuretic.  She has this to take as needed. Continue carvedilol. We will continue our efforts to obtain better blood pressure control.   #3 chronic low back pain and bilateral osteoarthritic knee pain. Has responded to as needed ibuprofen some in the past. I think is okay to try diclofenac 75 mg twice daily as needed.  Stop ibuprofen.   #4 severe mitral regurgitation.  History of mitral clip placement but it appears that this is leaking. There had been plans to get a repeat clip procedure with Dr. Burt Knack. She is hesitant to get this done. Hard to assess symptomatology from her valvular insufficiency because her activity level is so limited by her musculoskeletal pain."  INTERIM HX: She feels well. Home blood pressures have been normal (120s/70s).  We discussed her mobility limitations, primarily her left knee stiffness and heaviness. She describes the left knee is feeling this exact way prior to getting total knee replacement surgery in August 2022.  She did rehab afterwards but nothing changed.  She states it does not hurt and does not swell or buckle or catch.  It simply limits the extent of her walking because it feels heavy after a while.   Past Medical History:  Diagnosis Date   Achilles tendon rupture    left, no surgery required   Chronic diastolic heart failure ([place])    Gross hematuria 12/2020   Klebsiella on urine clx x2 but no UTI sx's.  CT showed tiny stones in each renal pelvis but no ureteral or bladder stones, no  mass.  Urol did cysto->normal. Plan is annual UA, rpt hematuria w/u in 3-5 yrs if persistent pos.   History of kidney stones    Hypertension    Hypertensive retinopathy of both eyes    Dr. Herbert Deaner   Low back pain    spondylosis, listhesis, +scoliosis at L/S jxn   Lower extremity edema    lasix prn   OA (osteoarthritis)    Osteopenia    Osteoporosis 07/2021   07/2021 T score -4.6   S/P mitral valve clip implantation 11/06/2020   s/p TEER with one XTW MitraClip on A2P2 by Dr. Burt Knack.  DAPT w/ASA and plavix x 3 mo post procedure recommended.   Severe mitral regurgitation    2021 transthoracic echo and TEE->cardiology following.  Mitral valve clip 10/2020.  f/u echo 10/28/20 mild/mod MVR and tricusp regurg   Severe pulmonary hypertension ([place]) 2021   transth echo; moderate by TEE 25/8527   Systolic murmur 78/24/2353   severe mitral regurge, mod/severe tricuspic regurg   Tremor of right hand     Past Surgical History:  Procedure Laterality Date   APPENDECTOMY     88 yrs old   BACK SURGERY  2004   ruptured disc   CHOLECYSTECTOMY     age 89   CYSTOSCOPY  02/24/2021   (for gross hematuria) ->normal.   DEXA  07/2021   2022 T score -4.6 (radius)   LUMBAR LAMINECTOMY  2004   MITRAL VALVE REPAIR  N/A 11/06/2020   Procedure: MITRAL VALVE REPAIR;  Surgeon: Sherren Mocha, MD;  Location: [facility];  Service: Cardiovascular;  Laterality: N/A;   RIGHT/LEFT HEART CATH AND CORONARY ANGIOGRAPHY N/A 09/16/2020   No CAD. Procedure: RIGHT/LEFT HEART CATH AND CORONARY ANGIOGRAPHY;  Surgeon: Belva Crome, MD;  Location: [facility];  Service: Cardiovascular;  Laterality: N/A;   TEE WITHOUT CARDIOVERSION N/A 09/16/2020   Procedure: TRANSESOPHAGEAL ECHOCARDIOGRAM (TEE);  Surgeon: Buford Dresser, MD;  Location: [facility] END[facility]COPY;  Service: Cardiovascular;  Laterality: N/A;   TEE WITHOUT CARDIOVERSION N/A 11/06/2020   Procedure: TRANSESOPHAGEAL ECHOCARDIOGRAM (TEE);  Surgeon: Sherren Mocha, MD;  Location: [facility];  Service: Cardiovascular;  Laterality: N/A;   TEE WITHOUT CARDIOVERSION N/A 06/16/2022   Procedure: TRANSESOPHAGEAL ECHOCARDIOGRAM (TEE);  Surgeon: Sanda Klein, MD;  Location: [facility];  Service: Cardiovascular;  Laterality: N/A;   TONSILLECTOMY     age 51   TOTAL KNEE ARTHROPLASTY Left 06/02/2021   Procedure: LEFT TOTAL KNEE ARTHROPLASTY;  Surgeon: Renette Butters, MD;  Location: [facility];  Service: Orthopedics;  Laterality: Left;   TRANSTHORACIC ECHOCARDIOGRAM  07/03/2020; 10/28/20; 12/03/20   06/2020 TTE EF 60-65%, grd II DD, severe pulm art HTN, severe mitral valve regurg, mod/sev tricuspic valve regurg. Conf on TEE 08/2020.  10/28/20 (s/p MV clip procedure) mild-mod MVR, EF 55-60%. 11/2020 EF 60%, mild/mod MR w/mean grad 82m hg and mod to sev TR.   TRANSTHORACIC ECHOCARDIOGRAM     10/2022 EF 60-65%, nl LV fxn, grd II DD, severe MV clip regurg, severe pulm HTN    Outpatient Medications Prior to Visit  Medication Sig Dispense Refill   amoxicillin (AMOXIL) 500 MG tablet Take 4 tablets (2,000 mg total) by mouth as directed. Take 4 tablets (2,000 mg total) by mouth 1 hour prior to all dental visits. 8 tablet 8   Ascorbic Acid (VITAMIN C PO) Take 1,000 mg by mouth in the morning.     azelastine (ASTELIN) 0.1 % nasal spray Place 2 sprays into both nostrils 2 (two) times daily. 30 mL 12   carvedilol (COREG) 25 MG tablet Take 1 tablet (25 mg total) by mouth in the morning and at bedtime. 180 tablet 1   cholecalciferol (VITAMIN D) 25 MCG (1000 UNIT) tablet Take 1,000 Units by mouth in the morning.     Cyanocobalamin (VITAMIN B-12 PO) Take 1 tablet by mouth in the morning.     diclofenac (VOLTAREN) 75 MG EC tablet 1 tab po bid prn back and knee pain 60 tablet 0   furosemide (LASIX) 20 MG tablet Take 1 tablet (20 mg total) by mouth daily. 90 tablet 3   ibandronate (BONIVA) 150 MG tablet Take 1 tablet (150 mg total) by mouth every 30 (thirty) days. Take in  the morning with a full glass of water, on an empty stomach, and do not take anything else by mouth or lie down for the next 30 min. 3 tablet 3   lisinopril (ZESTRIL) 10 MG tablet Take 1 tablet (10 mg total) by mouth daily. 30 tablet 0   Multiple Vitamin (MULTIVITAMIN WITH MINERALS) TABS tablet Take 1 tablet by mouth in the morning.     Multiple Vitamins-Minerals (PRESERVISION AREDS PO) Take 1 tablet by mouth in the morning and at bedtime.      nitroGLYCERIN (NITRODUR - DOSED IN MG/24 HR) 0.2 mg/hr patch 1/2 patch over L Achilles tendon bid (Patient taking differently: as needed (chest pain). 1/2 patch over L Achilles tendon  bid) 30 patch 12   potassium chloride (KLOR-CON) 10 MEQ tablet Take 10 mEq by mouth daily as needed (with furosemide (fluid retention)).     sodium chloride (MURO 128) 5 % ophthalmic solution Place 1 drop into both eyes at bedtime as needed (dry/irritated eyes.).     vitamin E 180 MG (400 UNITS) capsule Take 400 Units by mouth in the morning.     omeprazole (PRILOSEC) 20 MG capsule Take 1 capsule (20 mg total) by mouth daily. (Patient taking differently: Take 20 mg by mouth as needed (Heartburn).) 90 capsule 1   No facility-administered medications prior to visit.    Allergies  Allergen Reactions   Barbiturates Nausea And Vomiting   Demerol Nausea And Vomiting   Doxycycline Nausea Only   Hydrochlorothiazide Other (See Comments)    unknown   Irbesartan     HA   Losartan Potassium     hair loss   Olmesartan Medoxomil     hair loss   Telmisartan     cramps    Review of Systems As per HPI  PE:    11/11/2022   10:20 AM 11/08/2022   11:23 AM 11/03/2022   11:20 AM  Vitals with BMI  Height '5\' 3"'$  '5\' 3"'$    Weight 115 lbs 10 oz 115 lbs   BMI 29.93 71.69   Systolic 678 938 101  Diastolic 62 58 70  Pulse 74 56      Physical Exam  Gen: Alert, well appearing.  Patient is oriented to person, place, time, and situation. AFFECT: pleasant, lucid thought and  speech. Knees: No swelling, erythema, or tenderness.  Active and passive range of motion fully intact.  No instability.  LABS:  Last metabolic panel Lab Results  Component Value Date   GLUCOSE 97 11/03/2022   NA 143 11/03/2022   K 4.5 11/03/2022   CL 105 11/03/2022   CO2 30 11/03/2022   BUN 22 11/03/2022   CREATININE 0.66 11/03/2022   EGFR 85 06/07/2022   CALCIUM 8.8 11/03/2022   PROT 6.5 07/22/2021   ALBUMIN 4.0 07/22/2021   BILITOT 0.7 07/22/2021   ALKPHOS 69 07/22/2021   AST 12 07/22/2021   ALT 6 07/22/2021   ANIONGAP 6 07/03/2021   IMPRESSION AND PLAN:  #1 hypertension, good control now.  Continue lisinopril 10 mg a day and Coreg 25 mg twice a day. Basic metabolic panel today.  2.  Chronic left knee heaviness. Symptom unchanged with getting total knee replacement in August 2022. This limits her activity level significantly. Her orthopedist, Dr. Edmonia Lynch, had ordered a bone scan June 09, 2022 but patient decided not to get this done because she did not want to wait the 3 hours that it took to get it done. Patient is considering getting a second orthopedic opinion.  #3 mitral valve regurgitation, severe.  History of mitral valve clip placement, now leaking. Not symptomatic, but cannot tell the extent to which this may bother her with activity since activity is still limited by her left knee symptoms and her chronic low back pain. Dr. Burt Knack has presented the option of placing another clip but patient declines at this time.  An After Visit Summary was printed and given to the patient.  FOLLOW UP: Return in about 6 months (around 05/12/2023) for routine chronic illness f/u.  Signed:  Crissie Sickles, MD           11/11/2022

## 2022-11-11 NOTE — Telephone Encounter (Signed)
Please call patient and tell her that I would like for her to get an x-ray of her left thigh as well as her left knee.  I have ordered them for [employer].  She can go at her convenience without appointment. thx

## 2022-11-11 NOTE — Telephone Encounter (Signed)
Pt was advised of recommendations.

## 2022-11-17 ENCOUNTER — Ambulatory Visit (HOSPITAL_BASED_OUTPATIENT_CLINIC_OR_DEPARTMENT_OTHER)
Admission: RE | Admit: 2022-11-17 | Discharge: 2022-11-17 | Disposition: A | Discharge status: Home / Self Care | Payer: Medicare Other | Source: Ambulatory Visit | Attending: Family Medicine | Admitting: Family Medicine

## 2022-11-17 DIAGNOSIS — G8929 Other chronic pain: Secondary | ICD-10-CM | POA: Insufficient documentation

## 2022-11-17 DIAGNOSIS — R531 Weakness: Secondary | ICD-10-CM | POA: Diagnosis not present

## 2022-11-17 DIAGNOSIS — M25562 Pain in left knee: Secondary | ICD-10-CM | POA: Diagnosis not present

## 2022-11-17 DIAGNOSIS — M79652 Pain in left thigh: Secondary | ICD-10-CM | POA: Diagnosis not present

## 2022-11-17 DIAGNOSIS — Z96652 Presence of left artificial knee joint: Secondary | ICD-10-CM | POA: Diagnosis not present

## 2022-11-17 DIAGNOSIS — Z471 Aftercare following joint replacement surgery: Secondary | ICD-10-CM | POA: Diagnosis not present

## 2022-11-29 ENCOUNTER — Other Ambulatory Visit: Payer: Self-pay | Admitting: Family Medicine

## 2022-11-30 ENCOUNTER — Other Ambulatory Visit (HOSPITAL_COMMUNITY): Payer: Medicare Other

## 2022-12-01 DIAGNOSIS — M4180 Other forms of scoliosis, site unspecified: Secondary | ICD-10-CM | POA: Diagnosis not present

## 2022-12-13 DIAGNOSIS — H40013 Open angle with borderline findings, low risk, bilateral: Secondary | ICD-10-CM | POA: Diagnosis not present

## 2022-12-13 DIAGNOSIS — H348312 Tributary (branch) retinal vein occlusion, right eye, stable: Secondary | ICD-10-CM | POA: Diagnosis not present

## 2022-12-13 DIAGNOSIS — H353122 Nonexudative age-related macular degeneration, left eye, intermediate dry stage: Secondary | ICD-10-CM | POA: Diagnosis not present

## 2022-12-13 DIAGNOSIS — H35363 Drusen (degenerative) of macula, bilateral: Secondary | ICD-10-CM | POA: Diagnosis not present

## 2023-01-18 DIAGNOSIS — I890 Lymphedema, not elsewhere classified: Secondary | ICD-10-CM | POA: Diagnosis not present

## 2023-02-01 IMAGING — MR MR ABDOMEN WO/W CM MRCP
13 of 22 series · 23 of 48 positions shown · IV contrast (GADAVIST)
Comparison: 07/03/2021 chest CT angiogram. 02/04/2021 CT
abdomen/pelvis.

CLINICAL DATA: Progressive extrahepatic biliary ductal dilatation.
Remote cholecystectomy 60 years ago. No acute symptoms reported.

EXAM:
MRI ABDOMEN WITHOUT AND WITH CONTRAST (INCLUDING MRCP)
TECHNIQUE: Multiplanar multisequence MR imaging of the abdomen was performed
both before and after the administration of intravenous contrast.
Heavily T2-weighted images of the biliary and pancreatic ducts were
obtained, and three-dimensional MRCP images were rendered by post
processing.
CONTRAST:  5.3mL GADAVIST GADOBUTROL 1 MMOL/ML IV SOLN

[Series 3: T2 · coronal · 6.0mm · 1.33mm/px · 1 of 30 slices shown (1 of 3)]
[im 1/30]
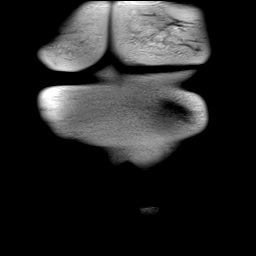

[Series 4: T2 · axial · 5.0mm · 1.33mm/px · 1 of 38 slices shown (2 of 3)]
[im 1/38]
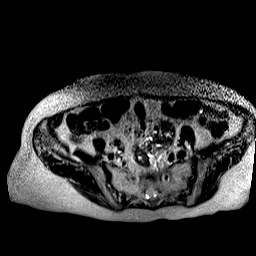

[Series 5: MRCP · coronal · 3.0mm · 0.78mm/px · 1 of 27 slices shown (1 of 3)]
[im 1/27]
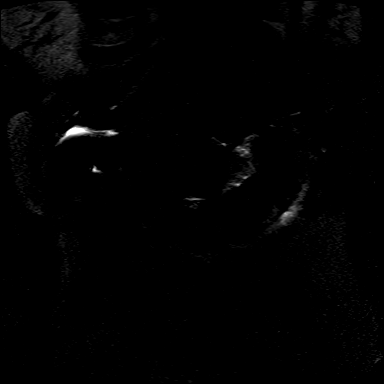

[Series 12: MRCP · coronal · 50.0mm · 0.78mm/px · 1 of 5 slices shown (2 of 3)]
[im 1/5]
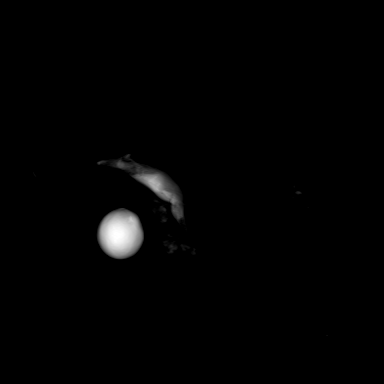

[Series 13: MRCP · sagittal · 50.0mm · 0.78mm/px · 1 of 5 slices shown (3 of 3)]
[im 1/5]
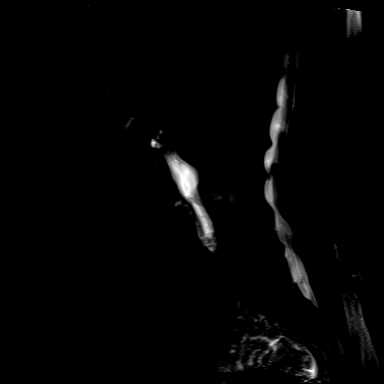

[Series 14: T2 fat-sat · axial · 6.0mm · 1.37mm/px · 1 of 34 slices shown]
[im 1/34]
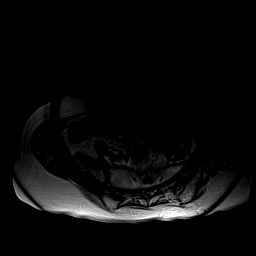

[Series 15: in + out · axial · 6.0mm · 0.68mm/px · z∈[-229,+4]mm · 2 of 64 slices shown]
[im 1/64]
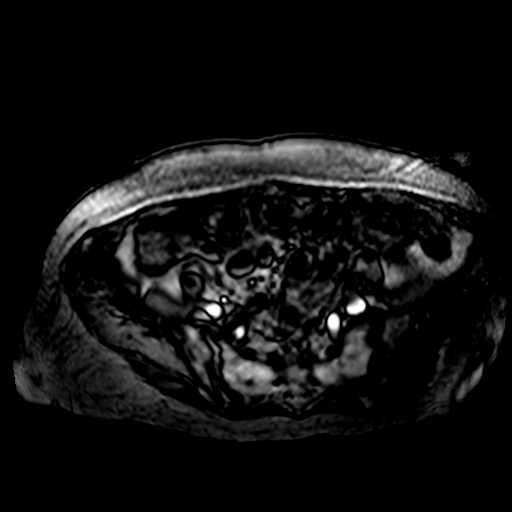
[im 64/64]
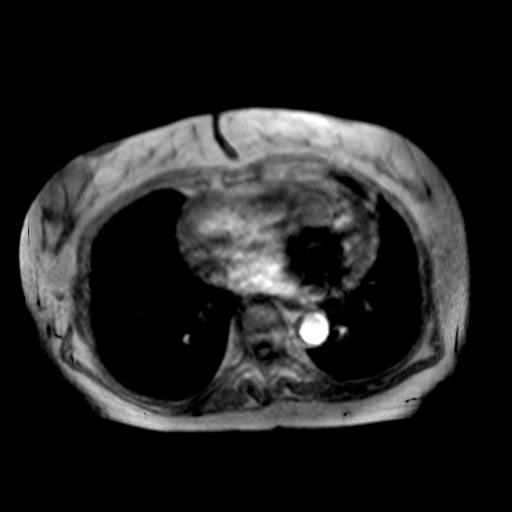

[Series 16: T2 · coronal · 6.0mm · 0.66mm/px · 1 of 30 slices shown (3 of 3)]
[im 1/30]
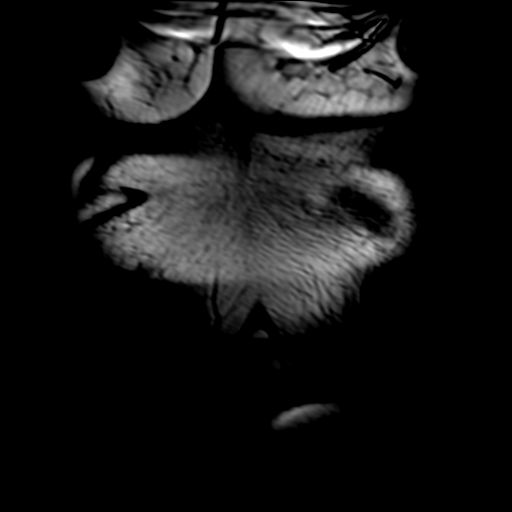

[Series 17: [id number] free breathing · axial · 6.0mm · 1.82mm/px · z∈[-216,+32]mm · 4 of 102 slices shown]
[im 1/102]
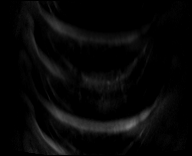
[im 34/102]
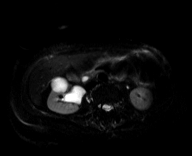
[im 68/102]
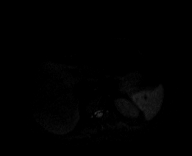
[im 102/102]
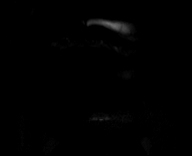

[Series 18: [id number] free breathing_adc · axial · 6.0mm · 1.82mm/px · z∈[-216,+32]mm · 2 of 34 slices shown]
[im 1/34]
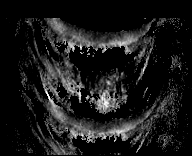
[im 34/34]
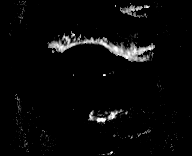

[Series 19: DWI · axial · 6.0mm · 0.68mm/px · z∈[-229,+4]mm · 2 of 32 slices shown]
[im 1/32]
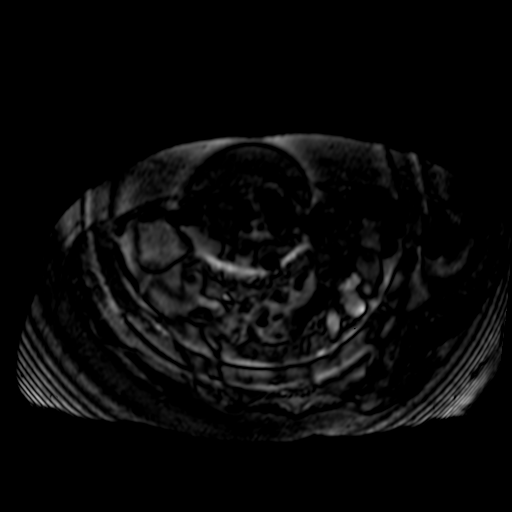
[im 32/32]
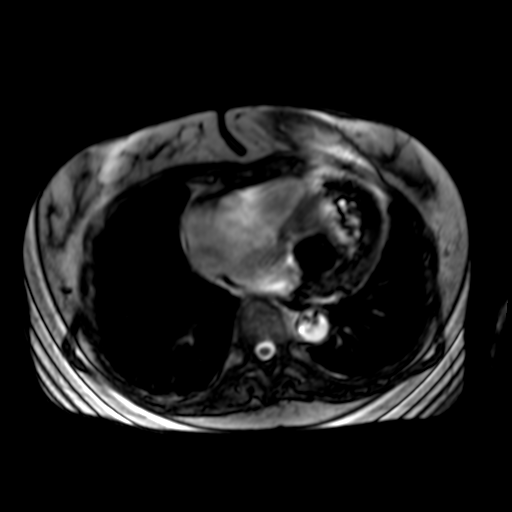

[Series 28: T1 dynamic fat-sat post-contrast · axial · delayed · 5.0mm · 1.22mm/px · z∈[-227,+8]mm · 3 of 48 slices shown (1 of 2)]
[im 1/48]
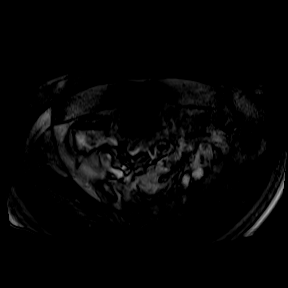
[im 24/48]
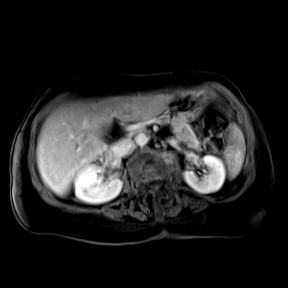
[im 48/48]
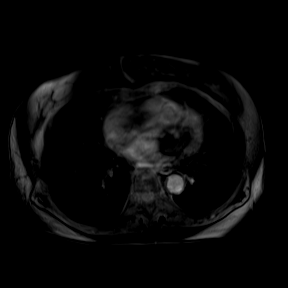

[Series 29: T1 dynamic fat-sat post-contrast · axial · 5.0mm · 1.22mm/px · z∈[-227,+8]mm · 3 of 48 slices shown (2 of 2)]
[im 1/48]
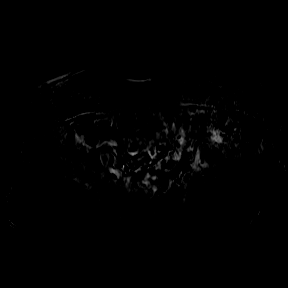
[im 24/48]
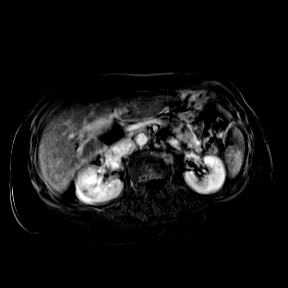
[im 48/48]
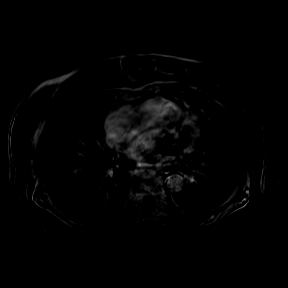

[23 of 48 positions shown; findings below may reference images not displayed]

FINDINGS: Lower chest: No acute abnormality at the lung bases.

Hepatobiliary: Normal liver size and configuration. No hepatic
steatosis. Subcentimeter simple inferior right liver cyst. No
suspicious liver masses. Cholecystectomy. Mild diffuse central
intrahepatic biliary ductal dilatation. Dilated common bile duct
measuring 19 mm diameter proximally with smooth distal tapering. No
biliary filling defects to suggest choledocholithiasis. No evidence
of biliary strictures, masses or beading.

Pancreas: No pancreatic mass or duct dilation.  No pancreas divisum.

Spleen: Normal size. No mass.

Adrenals/Urinary Tract: Normal adrenals. No hydronephrosis. Simple
4.0 cm anterior lower right renal cyst. No suspicious renal masses.
Mildly dilated right extrarenal pelvis. No overt hydronephrosis.

Stomach/Bowel: Normal non-distended stomach. Visualized small and
large bowel is normal caliber, with no bowel wall thickening.

Vascular/Lymphatic: Normal caliber abdominal aorta. Patent portal,
splenic, hepatic and renal veins. No pathologically enlarged lymph
nodes in the abdomen.

Other: No abdominal ascites or focal fluid collection.

Musculoskeletal: No aggressive appearing focal osseous lesions. Left
L4 vertebral hemangioma.
IMPRESSION: No acute abnormality. Mild diffuse central intrahepatic biliary
ductal dilatation. Dilated common bile duct measuring 19 mm diameter
with smooth distal tapering. No choledocholithiasis. No obstructing
biliary mass. No pancreatic duct dilation. Findings are presumably
due to chronic post cholecystectomy effect.

## 2023-02-04 NOTE — Progress Notes (Addendum)
[facility]   MULTIDISCIPLINARY HEART VALVE CLINIC                                     Cardiology Office Note:    Date:  02/08/2023   ID:  Erin [name], DOB December 07, 1934, MRN [id number]  PCP:  Jeoffrey Massed, MD  [facility] Cardiologist:  Lesleigh Noe, MD (Inactive) / Dr. Excell Seltzer, MD (TEER)  Referring MD: Jeoffrey Massed, MD   Chief Complaint  Patient presents with   Follow-up    3 month follow up; pre-TEER   History of Present Illness:    Erin [name] is a 88 y.o. female with a hx of HTN, OA, chronic diastolic CHF, and mitral valve prolapse with severe degenerative mitral regurgitation who underwent transcatheter edge-to-edge repair of the mitral valve in 10/2020 now with severe residual MR who si being seen today for 3 month follow up.   [name] was referred to Dr. Excell Seltzer with evaluation of possible TEER after  progressive symptoms of exertional shortness of breath. Transthoracic echocardiogram was performed July 03, 2020 revealing normal left ventricular systolic function with ejection fraction estimated 60 to 65%. There was mitral valve prolapse with what appeared to be partially flail segment of the middle scallop of the posterior leaflet causing severe mitral regurgitation.  She was referred for formal cardiology evaluation and initially seen by Dr. Katrinka Blazing 08/27/2020. TEE and diagnostic cardiac catheterization were performed September 16, 2020.  TEE confirmed the presence of myxomatous degenerative disease with partially flail segment involving the middle scallop (P2) of the posterior leaflet and severe mitral regurgitation. Left ventricular systolic function was reported to be normal with ejection fraction estimated 60 to 65%.   She has been followed very closely with Dr. Excell Seltzer since her procedure. Her one year echo showed moderate to severe residual MR with plans to follow with surveillance imaging. She continued to have progressive SOB and TEE from  05/2022 confirmed moderate to severe MR with myxomatous valve with normal LV function, well seated XTW attached at A2/P2 with ERO at 0.56cm2, RV at 26ml, RF 69% with completeblunting of systolic forward flow in the right pulmonary veins, but there is no flow reversal. Mean gradient at 4.83mmHg with HR 66bpm. She was scheduled for repeat TEER however cancelled her procedure. In most recent follow up with Dr. Excell Seltzer, she continued to have symptoms however deferred repeat procedure.   Today she is here alone and reports that over the last month she has developed worsening DOE with minimal activities and orthopnea. She states that she was previously able to lay flat for sleep, mostly sleeping on her side but this has progressed and she is now unable to lay on her back or side. She sleeps on the couch every night at this point. She cannot clean her home without stopping many times due to SOB. She denies LE edema, palpitations, dizziness, syncope, bleeding in stool or urine. Long discussion about moving forward with repeat TEER given progression of symptoms.   Past Medical History:  Diagnosis Date   Achilles tendon rupture    left, no surgery required   Chronic diastolic heart failure    Gross hematuria 12/2020   Klebsiella on urine clx x2 but no UTI sx's.  CT showed tiny stones in each renal pelvis but no ureteral or bladder stones, no mass.  Urol did cysto->normal. Plan is  annual UA, rpt hematuria w/u in 3-5 yrs if persistent pos.   History of kidney stones    Hypertension    Hypertensive retinopathy of both eyes    Dr. Elmer Picker   Low back pain    spondylosis, listhesis, +scoliosis at L/S jxn   Lower extremity edema    lasix prn   OA (osteoarthritis)    Osteopenia    Osteoporosis 07/2021   07/2021 T score -4.6   S/P mitral valve clip implantation 11/06/2020   s/p TEER with one XTW MitraClip on A2P2 by Dr. Excell Seltzer.  DAPT w/ASA and plavix x 3 mo post procedure recommended.   Severe mitral regurgitation     2021 transthoracic echo and TEE->cardiology following.  Mitral valve clip 10/2020.  f/u echo 10/28/20 mild/mod MVR and tricusp regurg   Severe pulmonary hypertension 2021   transth echo; moderate by TEE 08/2020   Systolic murmur 07/03/2020   severe mitral regurge, mod/severe tricuspic regurg   Tremor of right hand     Past Surgical History:  Procedure Laterality Date   APPENDECTOMY     88 yrs old   BACK SURGERY  2004   ruptured disc   CHOLECYSTECTOMY     age 44   CYSTOSCOPY  02/24/2021   (for gross hematuria) ->normal.   DEXA  07/2021   2022 T score -4.6 (radius)   LUMBAR LAMINECTOMY  2004   MITRAL VALVE REPAIR N/A 11/06/2020   Procedure: MITRAL VALVE REPAIR;  Surgeon: Tonny Bollman, MD;  Location: [facility] INVASIVE CV LAB;  Service: Cardiovascular;  Laterality: N/A;   RIGHT/LEFT HEART CATH AND CORONARY ANGIOGRAPHY N/A 09/16/2020   No CAD. Procedure: RIGHT/LEFT HEART CATH AND CORONARY ANGIOGRAPHY;  Surgeon: Lyn Records, MD;  Location: [facility] INVASIVE CV LAB;  Service: Cardiovascular;  Laterality: N/A;   TEE WITHOUT CARDIOVERSION N/A 09/16/2020   Procedure: TRANSESOPHAGEAL ECHOCARDIOGRAM (TEE);  Surgeon: Jodelle Red, MD;  Location: [facility] ENDOSCOPY;  Service: Cardiovascular;  Laterality: N/A;   TEE WITHOUT CARDIOVERSION N/A 11/06/2020   Procedure: TRANSESOPHAGEAL ECHOCARDIOGRAM (TEE);  Surgeon: Tonny Bollman, MD;  Location: [facility] INVASIVE CV LAB;  Service: Cardiovascular;  Laterality: N/A;   TEE WITHOUT CARDIOVERSION N/A 06/16/2022   Procedure: TRANSESOPHAGEAL ECHOCARDIOGRAM (TEE);  Surgeon: Thurmon Fair, MD;  Location: [facility] ENDOSCOPY;  Service: Cardiovascular;  Laterality: N/A;   TONSILLECTOMY     age 71   TOTAL KNEE ARTHROPLASTY Left 06/02/2021   Procedure: LEFT TOTAL KNEE ARTHROPLASTY;  Surgeon: Sheral Apley, MD;  Location: [facility] ORS;  Service: Orthopedics;  Laterality: Left;   TRANSTHORACIC ECHOCARDIOGRAM  07/03/2020; 10/28/20; 12/03/20   06/2020 TTE EF 60-65%, grd II DD, severe  pulm art HTN, severe mitral valve regurg, mod/sev tricuspic valve regurg. Conf on TEE 08/2020.  10/28/20 (s/p MV clip procedure) mild-mod MVR, EF 55-60%. 11/2020 EF 60%, mild/mod MR w/mean grad 4mm hg and mod to sev TR.   TRANSTHORACIC ECHOCARDIOGRAM     10/2022 EF 60-65%, nl LV fxn, grd II DD, severe MV clip regurg, severe pulm HTN    Current Medications: Current Meds  Medication Sig   amoxicillin (AMOXIL) 500 MG tablet Take 4 tablets (2,000 mg total) by mouth as directed. Take 4 tablets (2,000 mg total) by mouth 1 hour prior to all dental visits.   Ascorbic Acid (VITAMIN C PO) Take 1,000 mg by mouth in the morning.   azelastine (ASTELIN) 0.1 % nasal spray Place 2 sprays into both nostrils 2 (two) times daily.   carvedilol (COREG) 25 MG tablet Take 1  tablet (25 mg total) by mouth in the morning and at bedtime.   cholecalciferol (VITAMIN D) 25 MCG (1000 UNIT) tablet Take 1,000 Units by mouth in the morning.   Cyanocobalamin (VITAMIN B-12 PO) Take 1 tablet by mouth in the morning.   diclofenac (VOLTAREN) 75 MG EC tablet 1 tab po bid prn back and knee pain   furosemide (LASIX) 20 MG tablet Take 1 tablet (20 mg total) by mouth daily.   gabapentin (NEURONTIN) 100 MG capsule Take 100 mg by mouth daily.   gabapentin (NEURONTIN) 300 MG capsule Take 300 mg by mouth at bedtime.   ibandronate (BONIVA) 150 MG tablet Take 1 tablet (150 mg total) by mouth every 30 (thirty) days. Take in the morning with a full glass of water, on an empty stomach, and do not take anything else by mouth or lie down for the next 30 min.   lisinopril (ZESTRIL) 10 MG tablet Take 1 tablet by mouth once daily   Multiple Vitamin (MULTIVITAMIN WITH MINERALS) TABS tablet Take 1 tablet by mouth in the morning.   Multiple Vitamins-Minerals (PRESERVISION AREDS PO) Take 1 tablet by mouth in the morning and at bedtime.    nitroGLYCERIN (NITRODUR - DOSED IN MG/24 HR) 0.2 mg/hr patch 1/2 patch over L Achilles tendon bid (Patient taking  differently: as needed (chest pain). 1/2 patch over L Achilles tendon bid)   pantoprazole (PROTONIX) 40 MG tablet Take 1 tablet (40 mg total) by mouth daily.   potassium chloride (KLOR-CON) 10 MEQ tablet Take 10 mEq by mouth daily as needed (with furosemide (fluid retention)).   sodium chloride (MURO 128) 5 % ophthalmic solution Place 1 drop into both eyes at bedtime as needed (dry/irritated eyes.).   vitamin E 180 MG (400 UNITS) capsule Take 400 Units by mouth in the morning.     Allergies:   Barbiturates, Demerol, Doxycycline, Hydrochlorothiazide, Irbesartan, Losartan potassium, Olmesartan medoxomil, and Telmisartan   Social History   Socioeconomic History   Marital status: Married    Spouse name: Not on file   Number of children: Not on file   Years of education: Not on file   Highest education level: Not on file  Occupational History   Not on file  Tobacco Use   Smoking status: Never   Smokeless tobacco: Never  Vaping Use   Vaping Use: Never used  Substance and Sexual Activity   Alcohol use: No   Drug use: Never   Sexual activity: Not Currently  Other Topics Concern   Not on file  Social History Narrative   Married, 6 children, about 15 GC.  8 GGC   Former Diplomatic Services operational officer at [employer].  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   No tob.   No alc.   Social Determinants of Health   Financial Resource Strain: Low Risk  (11/03/2022)   Overall Financial Resource Strain (CARDIA)    Difficulty of Paying Living Expenses: Not hard at all  Food Insecurity: No Food Insecurity (11/03/2022)   Hunger Vital Sign    Worried About Running Out of Food in the Last Year: Never true    Ran Out of Food in the Last Year: Never true  Transportation Needs: No Transportation Needs (11/03/2022)   PRAPARE - Administrator, Civil Service (Medical): No    Lack of Transportation (Non-Medical): No  Physical Activity: Insufficiently Active (11/03/2022)   Exercise Vital  Sign    Days of  Exercise per Week: 7 days    Minutes of Exercise per Session: 20 min  Stress: No Stress Concern Present (11/03/2022)   [employer] of Occupational Health - Occupational Stress Questionnaire    Feeling of Stress : Not at all  Social Connections: Socially Integrated (11/03/2022)   Social Connection and Isolation Panel [NHANES]    Frequency of Communication with Friends and Family: More than three times a week    Frequency of Social Gatherings with Friends and Family: More than three times a week    Attends Religious Services: More than 4 times per year    Active Member of [employer] or Organizations: Yes    Attends Engineer, structural: More than 4 times per year    Marital Status: Married    Family History: The patient's family history includes Cancer in her father; Early death in her father; Hypertension in her mother and another family member; Liver cancer in her father. There is no history of Colon cancer or Rectal cancer.  ROS:   Please see the history of present illness.    All other systems reviewed and are negative.  EKGs/Labs/Other Studies Reviewed:    The following studies were reviewed today:  TEE 05/2022:   1. Left ventricular ejection fraction, by estimation, is 60 to 65%. The  left ventricle has normal function. The left ventricle has no regional  wall motion abnormalities. Left ventricular diastolic function could not  be evaluated.   2. Right ventricular systolic function is normal. The right ventricular  size is normal. There is moderately elevated pulmonary artery systolic  pressure. The estimated right ventricular systolic pressure is 49.1 mmHg.   3. Left atrial size was severely dilated. No left atrial/left atrial  appendage thrombus was detected.   4. Right atrial size was mildly dilated.   5. A well seated MitraClip XTW is firmly attached at A2-P2, with good  tissue bridge. Immediately lateral to the clip is a small segment of flail   posterior leaflet (still at P2). Effective regurgitant orifice area is  0.56 cmsq, regurgitant volume is 97  mL, regurgitant fraction 69%. There is complete blunting of systolic  forward flow in the right pulmonary veins, but there is no flow reversal.  Diastolic dominant flow in the left pulmonary veins. Vena contracta 3 mm.  Cumulative area of neo-mitral orifice   by planimetry is 3.46 cm sq by planimetry, 3.20 cm sq by PHT. The mitral  valve is myxomatous. Moderate to severe mitral valve regurgitation. The  mean mitral valve gradient is 4.3 mmHg with average heart rate of 66 bpm.  There is a Mitra-Clip present in  the mitral position. Procedure Date: 11/06/20.   6. Tricuspid valve regurgitation is moderate.   7. The aortic valve is tricuspid. Aortic valve regurgitation is not  visualized. No aortic stenosis is present.   8. Evidence of atrial level shunting detected by color flow Doppler.  There is a small secundum atrial septal defect with exclusively left to  right shunting across the atrial septum.    Echo 11/04/21:   1. Left ventricular ejection fraction, by estimation, is 60 to 65%. The  left ventricle has normal function. The left ventricle has no regional  wall motion abnormalities. Left ventricular diastolic parameters are  indeterminate.   2. Peak RV-RA gradient 34 mmHg. The IVC was not visualized. Right  ventricular systolic function is normal. The right ventricular size is  normal.   3. Left atrial size was severely dilated.  4. Right atrial size was mildly dilated.   5. Mitral valve repair s/p Mitraclip in A2-P2 position. Mean gradient 5  mmHg, suggesting mild stenosis (though gradient likely elevated by  significant residual mitral regurgitation). There is moderate-severe  mitral regurgitation that appears to  originate laterally to the Mitraclip and is directed anteriorly.   6. The tricuspid valve is abnormal. Tricuspid valve regurgitation is  moderate.   7. The  aortic valve is tricuspid. Aortic valve regurgitation is not  visualized. No aortic stenosis is present.   8. Small ASD from prior septal puncture with left to right flow.   TEER 11/06/20 MITRAL VALVE REPAIR  Conclusion Successful transcatheter edge-to-edge repair of the mitral valve with a MitraClip XTW device, placed A2/P2, reducing MR from 4+ at baseline to 1-2+ post-procedure   Recommendations   Antiplatelet/Anticoag Recommend uninterrupted dual antiplatelet therapy with Aspirin 81mg  daily and Clopidogrel 75mg  daily. 3 months DAPT with ASA and plavix   EKG:  EKG is not ordered today.    Recent Labs: 06/07/2022: Hemoglobin 11.9; Platelets 261 11/11/2022: BUN 27; Creatinine, Ser 0.69; Potassium 4.1; Sodium 141  Recent Lipid Panel    Component Value Date/Time   CHOL 139 07/02/2020 1057   TRIG 30.0 07/02/2020 1057   TRIG 33 09/21/2006 1115   HDL 80.30 07/02/2020 1057   CHOLHDL 2 07/02/2020 1057   VLDL 6.0 07/02/2020 1057   LDLCALC 53 07/02/2020 1057    STS surgical risk calculation:  Procedure Type: Isolated MVR PERIOPERATIVE OUTCOME ESTIMATE % Operative Mortality 5.74% Morbidity & Mortality 14.8% Stroke 2.53% Renal Failure 1.28% Reoperation 5.76% Prolonged Ventilation 6.46% Deep Sternal Wound Infection 0.031% Long Hospital Stay (>14 days) 8.09% Short Hospital Stay (<6 days)* 19.4%   Procedure Type: Isolated MV Repair  PERIOPERATIVE OUTCOME ESTIMATE % Operative Mortality 3.75% Morbidity & Mortality 9.14% Stroke 5.72% Renal Failure 0.862% Reoperation 4.29% Prolonged Ventilation 4.57% Deep Sternal Wound Infection 0.026% Long Hospital Stay (>14 days) 4.77% Short Hospital Stay (<6 days)* 33.1%  Physical Exam:    VS:  BP 124/72   Pulse 64   Ht 5\' 3"  (1.6 m)   Wt 119 lb 3.2 oz (54.1 kg)   SpO2 100%   BMI 21.12 kg/m     Wt Readings from Last 3 Encounters:  02/07/23 119 lb 3.2 oz (54.1 kg)  11/11/22 115 lb 9.6 oz (52.4 kg)  11/08/22 115 lb (52.2 kg)     General: Well developed, well nourished, NAD Lungs:Clear to ausculation bilaterally. No wheezes, rales, or rhonchi. Breathing is unlabored. Cardiovascular: RRR with S1 S2. + murmurs Extremities: No edema.  Neuro: Alert and oriented. No focal deficits. No facial asymmetry. MAE spontaneously. Psych: Responds to questions appropriately with normal affect.    ASSESSMENT/PLAN:    Mitral valve prolapse severe MR s/p TEER: Followed for some time for residual MR s/p TEER, now with NYHA class III symptoms of orthopnea and DOE. Long discussion regarding repeat TEER. She is now wanting to proceed due to progression of symptoms. Tentative plan for 5/8. Plan for PAT labs prior to procedure.   Chronic diastolic CHF: Appears euvolemic on exam today. Follow PAT labs   HTN: Stable with no changes needed today   OA: Having issues with hip/anterior leg pain. Seeing ortho soon for possible injection once stable from a valve standpoint.    Medication Adjustments/Labs and Tests Ordered: Current medicines are reviewed at length with the patient today.  Concerns regarding medicines are outlined above.  No orders of the defined types were  placed in this encounter.  No orders of the defined types were placed in this encounter.   Patient Instructions  Medication Instructions:  Your physician recommends that you continue on your current medications as directed. Please refer to the Current Medication list given to you today.  *If you need a refill on your cardiac medications before your next appointment, please call your pharmacy*   Lab Work: NONE If you have labs (blood work) drawn today and your tests are completely normal, you will receive your results only by: MyChart Message (if you have MyChart) OR A paper copy in the mail If you have any lab test that is abnormal or we need to change your treatment, we will call you to review the results.   Testing/Procedures: NONE   Follow-Up: At [facility], you and your health needs are our priority.  As part of our continuing mission to provide you with exceptional heart care, we have created designated Provider Care Teams.  These Care Teams include your primary Cardiologist (physician) and Advanced Practice Providers (APPs -  Physician Assistants and Nurse Practitioners) who all work together to provide you with the care you need, when you need it.  We recommend signing up for the patient portal called "MyChart".  Sign up information is provided on this After Visit Summary.  MyChart is used to connect with patients for Virtual Visits (Telemedicine).  Patients are able to view lab/test results, encounter notes, upcoming appointments, etc.  Non-urgent messages can be sent to your provider as well.   To learn more about what you can do with MyChart, go to [email or web address].    Your next appointment:   KEEP SCHEDULED FOLLOW-UP   Signed, Georgie Chard, NP  02/08/2023 7:50 AM    [facility]

## 2023-02-04 NOTE — H&P (View-Only) (Signed)
[facility]   MULTIDISCIPLINARY HEART VALVE CLINIC                                     Cardiology Office Note:    Date:  02/08/2023   ID:  Erin [name], DOB 11/15/1934, MRN [id number]  PCP:  McGowen, Philip H, MD  [facility] Cardiologist:  Henry W Smith III, MD (Inactive) / Dr. Cooper, MD (TEER)  Referring MD: McGowen, Philip H, MD   Chief Complaint  Patient presents with   Follow-up    3 month follow up; pre-TEER   History of Present Illness:    Erin [name] is a 87 y.o. female with a hx of HTN, OA, chronic diastolic CHF, and mitral valve prolapse with severe degenerative mitral regurgitation who underwent transcatheter edge-to-edge repair of the mitral valve in 10/2020 now with severe residual MR who si being seen today for 3 month follow up.   Erin [name] was referred to Dr. Cooper with evaluation of possible TEER after  progressive symptoms of exertional shortness of breath. Transthoracic echocardiogram was performed July 03, 2020 revealing normal left ventricular systolic function with ejection fraction estimated 60 to 65%. There was mitral valve prolapse with what appeared to be partially flail segment of the middle scallop of the posterior leaflet causing severe mitral regurgitation.  She was referred for formal cardiology evaluation and initially seen by Dr. Smith 08/27/2020. TEE and diagnostic cardiac catheterization were performed September 16, 2020.  TEE confirmed the presence of myxomatous degenerative disease with partially flail segment involving the middle scallop (P2) of the posterior leaflet and severe mitral regurgitation. Left ventricular systolic function was reported to be normal with ejection fraction estimated 60 to 65%.   She has been followed very closely with Dr. Cooper since her procedure. Her one year echo showed moderate to severe residual MR with plans to follow with surveillance imaging. She continued to have progressive SOB and TEE from  05/2022 confirmed moderate to severe MR with myxomatous valve with normal LV function, well seated XTW attached at A2/P2 with ERO at 0.56cm2, RV at 97ml, RF 69% with completeblunting of systolic forward flow in the right pulmonary veins, but there is no flow reversal. Mean gradient at 4.3mmHg with HR 66bpm. She was scheduled for repeat TEER however cancelled her procedure. In most recent follow up with Dr. Cooper, she continued to have symptoms however deferred repeat procedure.   Today she is here alone and reports that over the last month she has developed worsening DOE with minimal activities and orthopnea. She states that she was previously able to lay flat for sleep, mostly sleeping on her side but this has progressed and she is now unable to lay on her back or side. She sleeps on the couch every night at this point. She cannot clean her home without stopping many times due to SOB. She denies LE edema, palpitations, dizziness, syncope, bleeding in stool or urine. Long discussion about moving forward with repeat TEER given progression of symptoms.   Past Medical History:  Diagnosis Date   Achilles tendon rupture    left, no surgery required   Chronic diastolic heart failure    Gross hematuria 12/2020   Klebsiella on urine clx x2 but no UTI sx's.  CT showed tiny stones in each renal pelvis but no ureteral or bladder stones, no mass.  Urol did cysto->normal. Plan is   annual UA, rpt hematuria w/u in 3-5 yrs if persistent pos.   History of kidney stones    Hypertension    Hypertensive retinopathy of both eyes    Dr. hecker   Low back pain    spondylosis, listhesis, +scoliosis at L/S jxn   Lower extremity edema    lasix prn   OA (osteoarthritis)    Osteopenia    Osteoporosis 07/2021   07/2021 T score -4.6   S/P mitral valve clip implantation 11/06/2020   s/p TEER with one XTW MitraClip on A2P2 by Dr. Cooper.  DAPT w/ASA and plavix x 3 mo post procedure recommended.   Severe mitral regurgitation     2021 transthoracic echo and TEE->cardiology following.  Mitral valve clip 10/2020.  f/u echo 10/28/20 mild/mod MVR and tricusp regurg   Severe pulmonary hypertension 2021   transth echo; moderate by TEE 08/2020   Systolic murmur 07/03/2020   severe mitral regurge, mod/severe tricuspic regurg   Tremor of right hand     Past Surgical History:  Procedure Laterality Date   APPENDECTOMY     88 yrs old   BACK SURGERY  2004   ruptured disc   CHOLECYSTECTOMY     age 22   CYSTOSCOPY  02/24/2021   (for gross hematuria) ->normal.   DEXA  07/2021   2022 T score -4.6 (radius)   LUMBAR LAMINECTOMY  2004   MITRAL VALVE REPAIR N/A 11/06/2020   Procedure: MITRAL VALVE REPAIR;  Surgeon: Cooper, Michael, MD;  Location: [facility];  Service: Cardiovascular;  Laterality: N/A;   RIGHT/LEFT HEART CATH AND CORONARY ANGIOGRAPHY N/A 09/16/2020   No CAD. Procedure: RIGHT/LEFT HEART CATH AND CORONARY ANGIOGRAPHY;  Surgeon: Smith, Henry W, MD;  Location: [facility];  Service: Cardiovascular;  Laterality: N/A;   TEE WITHOUT CARDIOVERSION N/A 09/16/2020   Procedure: TRANSESOPHAGEAL ECHOCARDIOGRAM (TEE);  Surgeon: Christopher, Bridgette, MD;  Location: [facility];  Service: Cardiovascular;  Laterality: N/A;   TEE WITHOUT CARDIOVERSION N/A 11/06/2020   Procedure: TRANSESOPHAGEAL ECHOCARDIOGRAM (TEE);  Surgeon: Cooper, Michael, MD;  Location: [facility];  Service: Cardiovascular;  Laterality: N/A;   TEE WITHOUT CARDIOVERSION N/A 06/16/2022   Procedure: TRANSESOPHAGEAL ECHOCARDIOGRAM (TEE);  Surgeon: Croitoru, Mihai, MD;  Location: [facility];  Service: Cardiovascular;  Laterality: N/A;   TONSILLECTOMY     age 18   TOTAL KNEE ARTHROPLASTY Left 06/02/2021   Procedure: LEFT TOTAL KNEE ARTHROPLASTY;  Surgeon: Murphy, Timothy D, MD;  Location: [facility];  Service: Orthopedics;  Laterality: Left;   TRANSTHORACIC ECHOCARDIOGRAM  07/03/2020; 10/28/20; 12/03/20   06/2020 TTE EF 60-65%, grd II DD, severe  pulm art HTN, severe mitral valve regurg, mod/sev tricuspic valve regurg. Conf on TEE 08/2020.  10/28/20 (s/p MV clip procedure) mild-mod MVR, EF 55-60%. 11/2020 EF 60%, mild/mod MR w/mean grad 4mm hg and mod to sev TR.   TRANSTHORACIC ECHOCARDIOGRAM     10/2022 EF 60-65%, nl LV fxn, grd II DD, severe MV clip regurg, severe pulm HTN    Current Medications: Current Meds  Medication Sig   amoxicillin (AMOXIL) 500 MG tablet Take 4 tablets (2,000 mg total) by mouth as directed. Take 4 tablets (2,000 mg total) by mouth 1 hour prior to all dental visits.   Ascorbic Acid (VITAMIN C PO) Take 1,000 mg by mouth in the morning.   azelastine (ASTELIN) 0.1 % nasal spray Place 2 sprays into both nostrils 2 (two) times daily.   carvedilol (COREG) 25 MG tablet Take 1   tablet (25 mg total) by mouth in the morning and at bedtime.   cholecalciferol (VITAMIN D) 25 MCG (1000 UNIT) tablet Take 1,000 Units by mouth in the morning.   Cyanocobalamin (VITAMIN B-12 PO) Take 1 tablet by mouth in the morning.   diclofenac (VOLTAREN) 75 MG EC tablet 1 tab po bid prn back and knee pain   furosemide (LASIX) 20 MG tablet Take 1 tablet (20 mg total) by mouth daily.   gabapentin (NEURONTIN) 100 MG capsule Take 100 mg by mouth daily.   gabapentin (NEURONTIN) 300 MG capsule Take 300 mg by mouth at bedtime.   ibandronate (BONIVA) 150 MG tablet Take 1 tablet (150 mg total) by mouth every 30 (thirty) days. Take in the morning with a full glass of water, on an empty stomach, and do not take anything else by mouth or lie down for the next 30 min.   lisinopril (ZESTRIL) 10 MG tablet Take 1 tablet by mouth once daily   Multiple Vitamin (MULTIVITAMIN WITH MINERALS) TABS tablet Take 1 tablet by mouth in the morning.   Multiple Vitamins-Minerals (PRESERVISION AREDS PO) Take 1 tablet by mouth in the morning and at bedtime.    nitroGLYCERIN (NITRODUR - DOSED IN MG/24 HR) 0.2 mg/hr patch 1/2 patch over L Achilles tendon bid (Patient taking  differently: as needed (chest pain). 1/2 patch over L Achilles tendon bid)   pantoprazole (PROTONIX) 40 MG tablet Take 1 tablet (40 mg total) by mouth daily.   potassium chloride (KLOR-CON) 10 MEQ tablet Take 10 mEq by mouth daily as needed (with furosemide (fluid retention)).   sodium chloride (MURO 128) 5 % ophthalmic solution Place 1 drop into both eyes at bedtime as needed (dry/irritated eyes.).   vitamin E 180 MG (400 UNITS) capsule Take 400 Units by mouth in the morning.     Allergies:   Barbiturates, Demerol, Doxycycline, Hydrochlorothiazide, Irbesartan, Losartan potassium, Olmesartan medoxomil, and Telmisartan   Social History   Socioeconomic History   Marital status: Married    Spouse name: Not on file   Number of children: Not on file   Years of education: Not on file   Highest education level: Not on file  Occupational History   Not on file  Tobacco Use   Smoking status: Never   Smokeless tobacco: Never  Vaping Use   Vaping Use: Never used  Substance and Sexual Activity   Alcohol use: No   Drug use: Never   Sexual activity: Not Currently  Other Topics Concern   Not on file  Social History Narrative   Married, 6 children, about 15 GC.  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   No tob.   No alc.   Social Determinants of Health   Financial Resource Strain: Low Risk  (11/03/2022)   Overall Financial Resource Strain (CARDIA)    Difficulty of Paying Living Expenses: Not hard at all  Food Insecurity: No Food Insecurity (11/03/2022)   Hunger Vital Sign    Worried About Running Out of Food in the Last Year: Never true    Ran Out of Food in the Last Year: Never true  Transportation Needs: No Transportation Needs (11/03/2022)   PRAPARE - Transportation    Lack of Transportation (Medical): No    Lack of Transportation (Non-Medical): No  Physical Activity: Insufficiently Active (11/03/2022)   Exercise Vital  Sign    Days of   Exercise per Week: 7 days    Minutes of Exercise per Session: 20 min  Stress: No Stress Concern Present (11/03/2022)   [employer] - Occupational Stress Questionnaire    Feeling of Stress : Not at all  Social Connections: Socially Integrated (11/03/2022)   Social Connection and Isolation Panel [NHANES]    Frequency of Communication with Friends and Family: More than three times a week    Frequency of Social Gatherings with Friends and Family: More than three times a week    Attends Religious Services: More than 4 times per year    Active Member of Clubs or Organizations: Yes    Attends Club or Organization Meetings: More than 4 times per year    Marital Status: Married    Family History: The patient's family history includes Cancer in her father; Early death in her father; Hypertension in her mother and another family member; Liver cancer in her father. There is no history of Colon cancer or Rectal cancer.  ROS:   Please see the history of present illness.    All other systems reviewed and are negative.  EKGs/Labs/Other Studies Reviewed:    The following studies were reviewed today:  TEE 05/2022:   1. Left ventricular ejection fraction, by estimation, is 60 to 65%. The  left ventricle has normal function. The left ventricle has no regional  wall motion abnormalities. Left ventricular diastolic function could not  be evaluated.   2. Right ventricular systolic function is normal. The right ventricular  size is normal. There is moderately elevated pulmonary artery systolic  pressure. The estimated right ventricular systolic pressure is 49.1 mmHg.   3. Left atrial size was severely dilated. No left atrial/left atrial  appendage thrombus was detected.   4. Right atrial size was mildly dilated.   5. A well seated MitraClip XTW is firmly attached at A2-P2, with good  tissue bridge. Immediately lateral to the clip is a small segment of flail   posterior leaflet (still at P2). Effective regurgitant orifice area is  0.56 cmsq, regurgitant volume is 97  mL, regurgitant fraction 69%. There is complete blunting of systolic  forward flow in the right pulmonary veins, but there is no flow reversal.  Diastolic dominant flow in the left pulmonary veins. Vena contracta 3 mm.  Cumulative area of neo-mitral orifice   by planimetry is 3.46 cm sq by planimetry, 3.20 cm sq by PHT. The mitral  valve is myxomatous. Moderate to severe mitral valve regurgitation. The  mean mitral valve gradient is 4.3 mmHg with average heart rate of 66 bpm.  There is a Mitra-Clip present in  the mitral position. Procedure Date: 11/06/20.   6. Tricuspid valve regurgitation is moderate.   7. The aortic valve is tricuspid. Aortic valve regurgitation is not  visualized. No aortic stenosis is present.   8. Evidence of atrial level shunting detected by color flow Doppler.  There is a small secundum atrial septal defect with exclusively left to  right shunting across the atrial septum.    Echo 11/04/21:   1. Left ventricular ejection fraction, by estimation, is 60 to 65%. The  left ventricle has normal function. The left ventricle has no regional  wall motion abnormalities. Left ventricular diastolic parameters are  indeterminate.   2. Peak RV-RA gradient 34 mmHg. The IVC was not visualized. Right  ventricular systolic function is normal. The right ventricular size is  normal.   3. Left atrial size was severely dilated.     4. Right atrial size was mildly dilated.   5. Mitral valve repair s/p Mitraclip in A2-P2 position. Mean gradient 5  mmHg, suggesting mild stenosis (though gradient likely elevated by  significant residual mitral regurgitation). There is moderate-severe  mitral regurgitation that appears to  originate laterally to the Mitraclip and is directed anteriorly.   6. The tricuspid valve is abnormal. Tricuspid valve regurgitation is  moderate.   7. The  aortic valve is tricuspid. Aortic valve regurgitation is not  visualized. No aortic stenosis is present.   8. Small ASD from prior septal puncture with left to right flow.   TEER 11/06/20 MITRAL VALVE REPAIR  Conclusion Successful transcatheter edge-to-edge repair of the mitral valve with a MitraClip XTW device, placed A2/P2, reducing MR from 4+ at baseline to 1-2+ post-procedure   Recommendations   Antiplatelet/Anticoag Recommend uninterrupted dual antiplatelet therapy with Aspirin 81mg daily and Clopidogrel 75mg daily. 3 months DAPT with ASA and plavix   EKG:  EKG is not ordered today.    Recent Labs: 06/07/2022: Hemoglobin 11.9; Platelets 261 11/11/2022: BUN 27; Creatinine, Ser 0.69; Potassium 4.1; Sodium 141  Recent Lipid Panel    Component Value Date/Time   CHOL 139 07/02/2020 1057   TRIG 30.0 07/02/2020 1057   TRIG 33 09/21/2006 1115   HDL 80.30 07/02/2020 1057   CHOLHDL 2 07/02/2020 1057   VLDL 6.0 07/02/2020 1057   LDLCALC 53 07/02/2020 1057    STS surgical risk calculation:  Procedure Type: Isolated MVR PERIOPERATIVE OUTCOME ESTIMATE % Operative Mortality 5.74% Morbidity & Mortality 14.8% Stroke 2.53% Renal Failure 1.28% Reoperation 5.76% Prolonged Ventilation 6.46% Deep Sternal Wound Infection 0.031% Long Hospital Stay (>14 days) 8.09% Short Hospital Stay (<6 days)* 19.4%   Procedure Type: Isolated MV Repair  PERIOPERATIVE OUTCOME ESTIMATE % Operative Mortality 3.75% Morbidity & Mortality 9.14% Stroke 5.72% Renal Failure 0.862% Reoperation 4.29% Prolonged Ventilation 4.57% Deep Sternal Wound Infection 0.026% Long Hospital Stay (>14 days) 4.77% Short Hospital Stay (<6 days)* 33.1%  Physical Exam:    VS:  BP 124/72   Pulse 64   Ht 5' 3" (1.6 m)   Wt 119 lb 3.2 oz (54.1 kg)   SpO2 100%   BMI 21.12 kg/m     Wt Readings from Last 3 Encounters:  02/07/23 119 lb 3.2 oz (54.1 kg)  11/11/22 115 lb 9.6 oz (52.4 kg)  11/08/22 115 lb (52.2 kg)     General: Well developed, well nourished, NAD Lungs:Clear to ausculation bilaterally. No wheezes, rales, or rhonchi. Breathing is unlabored. Cardiovascular: RRR with S1 S2. + murmurs Extremities: No edema.  Neuro: Alert and oriented. No focal deficits. No facial asymmetry. MAE spontaneously. Psych: Responds to questions appropriately with normal affect.    ASSESSMENT/PLAN:    Mitral valve prolapse severe MR s/p TEER: Followed for some time for residual MR s/p TEER, now with NYHA class III symptoms of orthopnea and DOE. Long discussion regarding repeat TEER. She is now wanting to proceed due to progression of symptoms. Tentative plan for 5/8. Plan for PAT labs prior to procedure.   Chronic diastolic CHF: Appears euvolemic on exam today. Follow PAT labs   HTN: Stable with no changes needed today   OA: Having issues with hip/anterior leg pain. Seeing ortho soon for possible injection once stable from a valve standpoint.    Medication Adjustments/Labs and Tests Ordered: Current medicines are reviewed at length with the patient today.  Concerns regarding medicines are outlined above.  No orders of the defined types were   placed in this encounter.  No orders of the defined types were placed in this encounter.   Patient Instructions  Medication Instructions:  Your physician recommends that you continue on your current medications as directed. Please refer to the Current Medication list given to you today.  *If you need a refill on your cardiac medications before your next appointment, please call your pharmacy*   Lab Work: NONE If you have labs (blood work) drawn today and your tests are completely normal, you will receive your results only by: MyChart Message (if you have MyChart) OR A paper copy in the mail If you have any lab test that is abnormal or we need to change your treatment, we will call you to review the results.   Testing/Procedures: NONE   Follow-Up: At [facility], you and your health needs are our priority.  As part of our continuing mission to provide you with exceptional heart care, we have created designated Provider Care Teams.  These Care Teams include your primary Cardiologist (physician) and Advanced Practice Providers (APPs -  Physician Assistants and Nurse Practitioners) who all work together to provide you with the care you need, when you need it.  We recommend signing up for the patient portal called "MyChart".  Sign up information is provided on this After Visit Summary.  MyChart is used to connect with patients for Virtual Visits (Telemedicine).  Patients are able to view lab/test results, encounter notes, upcoming appointments, etc.  Non-urgent messages can be sent to your provider as well.   To learn more about what you can do with MyChart, go to [email or web address].    Your next appointment:   KEEP SCHEDULED FOLLOW-UP   Signed, [place], NP  02/08/2023 7:50 AM    [facility] 

## 2023-02-07 ENCOUNTER — Ambulatory Visit: Payer: Medicare Other | Attending: Cardiology | Admitting: Cardiology

## 2023-02-07 VITALS — BP 124/72 | HR 64 | Ht 63.0 in | Wt 119.2 lb

## 2023-02-07 DIAGNOSIS — Z01810 Encounter for preprocedural cardiovascular examination: Secondary | ICD-10-CM | POA: Diagnosis not present

## 2023-02-07 DIAGNOSIS — M1712 Unilateral primary osteoarthritis, left knee: Secondary | ICD-10-CM

## 2023-02-07 DIAGNOSIS — Z9889 Other specified postprocedural states: Secondary | ICD-10-CM

## 2023-02-07 DIAGNOSIS — I340 Nonrheumatic mitral (valve) insufficiency: Secondary | ICD-10-CM

## 2023-02-07 DIAGNOSIS — I5032 Chronic diastolic (congestive) heart failure: Secondary | ICD-10-CM | POA: Diagnosis not present

## 2023-02-07 DIAGNOSIS — Z95818 Presence of other cardiac implants and grafts: Secondary | ICD-10-CM | POA: Diagnosis not present

## 2023-02-07 DIAGNOSIS — I10 Essential (primary) hypertension: Secondary | ICD-10-CM

## 2023-02-07 NOTE — Patient Instructions (Signed)
Medication Instructions:  Your physician recommends that you continue on your current medications as directed. Please refer to the Current Medication list given to you today.  *If you need a refill on your cardiac medications before your next appointment, please call your pharmacy*   Lab Work: NONE If you have labs (blood work) drawn today and your tests are completely normal, you will receive your results only by: MyChart Message (if you have MyChart) OR A paper copy in the mail If you have any lab test that is abnormal or we need to change your treatment, we will call you to review the results.   Testing/Procedures: NONE   Follow-Up: At [facility], you and your health needs are our priority.  As part of our continuing mission to provide you with exceptional heart care, we have created designated Provider Care Teams.  These Care Teams include your primary Cardiologist (physician) and Advanced Practice Providers (APPs -  Physician Assistants and Nurse Practitioners) who all work together to provide you with the care you need, when you need it.  We recommend signing up for the patient portal called "MyChart".  Sign up information is provided on this After Visit Summary.  MyChart is used to connect with patients for Virtual Visits (Telemedicine).  Patients are able to view lab/test results, encounter notes, upcoming appointments, etc.  Non-urgent messages can be sent to your provider as well.   To learn more about what you can do with MyChart, go to [email or web address].    Your next appointment:   KEEP SCHEDULED FOLLOW-UP 

## 2023-02-08 ENCOUNTER — Other Ambulatory Visit: Payer: Self-pay

## 2023-02-08 DIAGNOSIS — I5032 Chronic diastolic (congestive) heart failure: Secondary | ICD-10-CM

## 2023-02-08 DIAGNOSIS — I340 Nonrheumatic mitral (valve) insufficiency: Secondary | ICD-10-CM

## 2023-02-28 ENCOUNTER — Encounter (HOSPITAL_COMMUNITY)
Admission: RE | Admit: 2023-02-28 | Discharge: 2023-02-28 | Disposition: A | Discharge status: Home / Self Care | Payer: Medicare Other | Source: Ambulatory Visit | Attending: Cardiovascular Disease | Admitting: Cardiovascular Disease

## 2023-02-28 ENCOUNTER — Other Ambulatory Visit: Payer: Self-pay

## 2023-02-28 ENCOUNTER — Ambulatory Visit (HOSPITAL_COMMUNITY)
Admission: RE | Admit: 2023-02-28 | Discharge: 2023-02-28 | Disposition: A | Discharge status: Home / Self Care | Payer: Medicare Other | Source: Ambulatory Visit | Attending: Cardiovascular Disease | Admitting: Cardiovascular Disease

## 2023-02-28 DIAGNOSIS — Z1152 Encounter for screening for COVID-19: Secondary | ICD-10-CM | POA: Diagnosis not present

## 2023-02-28 DIAGNOSIS — I5032 Chronic diastolic (congestive) heart failure: Secondary | ICD-10-CM

## 2023-02-28 DIAGNOSIS — H35033 Hypertensive retinopathy, bilateral: Secondary | ICD-10-CM | POA: Diagnosis not present

## 2023-02-28 DIAGNOSIS — Z8249 Family history of ischemic heart disease and other diseases of the circulatory system: Secondary | ICD-10-CM | POA: Diagnosis not present

## 2023-02-28 DIAGNOSIS — M47817 Spondylosis without myelopathy or radiculopathy, lumbosacral region: Secondary | ICD-10-CM | POA: Diagnosis not present

## 2023-02-28 DIAGNOSIS — Z79899 Other long term (current) drug therapy: Secondary | ICD-10-CM | POA: Diagnosis not present

## 2023-02-28 DIAGNOSIS — Z885 Allergy status to narcotic agent status: Secondary | ICD-10-CM | POA: Diagnosis not present

## 2023-02-28 DIAGNOSIS — Z96652 Presence of left artificial knee joint: Secondary | ICD-10-CM | POA: Diagnosis not present

## 2023-02-28 DIAGNOSIS — R011 Cardiac murmur, unspecified: Secondary | ICD-10-CM | POA: Diagnosis present

## 2023-02-28 DIAGNOSIS — I119 Hypertensive heart disease without heart failure: Secondary | ICD-10-CM | POA: Diagnosis not present

## 2023-02-28 DIAGNOSIS — I110 Hypertensive heart disease with heart failure: Secondary | ICD-10-CM | POA: Diagnosis not present

## 2023-02-28 DIAGNOSIS — I340 Nonrheumatic mitral (valve) insufficiency: Secondary | ICD-10-CM

## 2023-02-28 DIAGNOSIS — I10 Essential (primary) hypertension: Secondary | ICD-10-CM | POA: Diagnosis not present

## 2023-02-28 DIAGNOSIS — Z888 Allergy status to other drugs, medicaments and biological substances status: Secondary | ICD-10-CM | POA: Diagnosis not present

## 2023-02-28 DIAGNOSIS — Z881 Allergy status to other antibiotic agents status: Secondary | ICD-10-CM | POA: Diagnosis not present

## 2023-02-28 DIAGNOSIS — R0601 Orthopnea: Secondary | ICD-10-CM | POA: Diagnosis present

## 2023-02-28 DIAGNOSIS — I083 Combined rheumatic disorders of mitral, aortic and tricuspid valves: Secondary | ICD-10-CM | POA: Diagnosis not present

## 2023-02-28 DIAGNOSIS — I272 Pulmonary hypertension, unspecified: Secondary | ICD-10-CM | POA: Diagnosis not present

## 2023-02-28 DIAGNOSIS — Z87442 Personal history of urinary calculi: Secondary | ICD-10-CM | POA: Diagnosis not present

## 2023-02-28 DIAGNOSIS — Z954 Presence of other heart-valve replacement: Secondary | ICD-10-CM | POA: Diagnosis not present

## 2023-02-28 DIAGNOSIS — M810 Age-related osteoporosis without current pathological fracture: Secondary | ICD-10-CM | POA: Diagnosis not present

## 2023-02-28 DIAGNOSIS — Z01818 Encounter for other preprocedural examination: Secondary | ICD-10-CM

## 2023-02-28 DIAGNOSIS — M1990 Unspecified osteoarthritis, unspecified site: Secondary | ICD-10-CM | POA: Diagnosis not present

## 2023-02-28 DIAGNOSIS — Z9049 Acquired absence of other specified parts of digestive tract: Secondary | ICD-10-CM | POA: Diagnosis not present

## 2023-02-28 DIAGNOSIS — I3139 Other pericardial effusion (noninflammatory): Secondary | ICD-10-CM | POA: Diagnosis not present

## 2023-02-28 DIAGNOSIS — Z006 Encounter for examination for normal comparison and control in clinical research program: Secondary | ICD-10-CM | POA: Diagnosis not present

## 2023-02-28 LAB — URINALYSIS, ROUTINE W REFLEX MICROSCOPIC
Bilirubin Urine: NEGATIVE
Glucose, UA: NEGATIVE mg/dL
Hgb urine dipstick: NEGATIVE
Ketones, ur: NEGATIVE mg/dL
Nitrite: NEGATIVE
Protein, ur: NEGATIVE mg/dL
Specific Gravity, Urine: 1.011 (ref 1.005–1.030)
pH: 7 (ref 5.0–8.0)

## 2023-02-28 LAB — COMPREHENSIVE METABOLIC PANEL
ALT: 11 U/L (ref 0–44)
AST: 20 U/L (ref 15–41)
Albumin: 3.6 g/dL (ref 3.5–5.0)
Alkaline Phosphatase: 56 U/L (ref 38–126)
Anion gap: 8 (ref 5–15)
BUN: 15 mg/dL (ref 8–23)
CO2: 31 mmol/L (ref 22–32)
Calcium: 9 mg/dL (ref 8.9–10.3)
Chloride: 101 mmol/L (ref 98–111)
Creatinine, Ser: 0.67 mg/dL (ref 0.44–1.00)
GFR, Estimated: >60 mL/min (ref >60)
Glucose, Bld: 102 mg/dL — ABNORMAL HIGH (ref 70–99)
Potassium: 4 mmol/L (ref 3.5–5.1)
Sodium: 140 mmol/L (ref 135–145)
Total Bilirubin: 0.8 mg/dL (ref 0.3–1.2)
Total Protein: 6.3 g/dL — ABNORMAL LOW (ref 6.5–8.1)

## 2023-02-28 LAB — TYPE AND SCREEN
ABO/RH(D): O POS
Antibody Screen: NEGATIVE

## 2023-02-28 LAB — CBC
HCT: 40.4 % (ref 36.0–46.0)
Hemoglobin: 12.5 g/dL (ref 12.0–15.0)
MCH: 28.6 pg (ref 26.0–34.0)
MCHC: 30.9 g/dL (ref 30.0–36.0)
MCV: 92.4 fL (ref 80.0–100.0)
Platelets: 239 10*3/uL (ref 150–400)
RBC: 4.37 MIL/uL (ref 3.87–5.11)
RDW: 14.9 % (ref 11.5–15.5)
WBC: 7.3 10*3/uL (ref 4.0–10.5)
nRBC: 0 % (ref 0.0–0.2)

## 2023-02-28 LAB — SURGICAL PCR SCREEN
MRSA, PCR: NEGATIVE
Staphylococcus aureus: POSITIVE — AB

## 2023-02-28 LAB — BRAIN NATRIURETIC PEPTIDE: B Natriuretic Peptide: 659.1 pg/mL — ABNORMAL HIGH (ref 0.0–100.0)

## 2023-02-28 LAB — PROTIME-INR
INR: 1.1 (ref 0.8–1.2)
Prothrombin Time: 14.2 seconds (ref 11.4–15.2)

## 2023-02-28 NOTE — Progress Notes (Addendum)
Patient signed all consents at PAT lab appointment. CHG soap and instructions were given to patient. CHG surgical prep reviewed with patient and all questions answered.  Pt denies any respiratory illness/infection that last two months. She does endorse a dry, non-productive cough that she says she has had for a while. Pt instructed to let us know if she develops any additional symptoms such as fever, sore throat, productive cough, and/or runny nose.  Pt given Pharmacy Call Number and instructed to call when she got home to review medications prior to her surgery on Wednesday. Pt understood instructions.

## 2023-03-01 LAB — SARS CORONAVIRUS 2 (TAT 6-24 HRS): SARS Coronavirus 2: NEGATIVE

## 2023-03-02 ENCOUNTER — Other Ambulatory Visit: Payer: Self-pay | Admitting: Cardiology

## 2023-03-02 ENCOUNTER — Other Ambulatory Visit: Payer: Self-pay

## 2023-03-02 ENCOUNTER — Encounter (HOSPITAL_COMMUNITY): Payer: Self-pay | Admitting: Cardiovascular Disease

## 2023-03-02 ENCOUNTER — Encounter (HOSPITAL_COMMUNITY)
Admission: RE | Disposition: A | Discharge status: Home / Self Care | Payer: Self-pay | Source: Home / Self Care | Attending: Cardiovascular Disease

## 2023-03-02 ENCOUNTER — Inpatient Hospital Stay (HOSPITAL_COMMUNITY): Payer: Medicare Other

## 2023-03-02 ENCOUNTER — Inpatient Hospital Stay (HOSPITAL_COMMUNITY): Payer: Medicare Other | Admitting: Physician Assistant

## 2023-03-02 ENCOUNTER — Inpatient Hospital Stay (HOSPITAL_COMMUNITY): Payer: Medicare Other | Admitting: Certified Registered Nurse Anesthetist

## 2023-03-02 ENCOUNTER — Inpatient Hospital Stay (HOSPITAL_COMMUNITY)
Admission: RE | Admit: 2023-03-02 | Discharge: 2023-03-03 | DRG: 267 | Disposition: A | Discharge status: Home / Self Care | Payer: Medicare Other | Attending: Cardiovascular Disease | Admitting: Cardiovascular Disease

## 2023-03-02 DIAGNOSIS — I10 Essential (primary) hypertension: Secondary | ICD-10-CM

## 2023-03-02 DIAGNOSIS — M810 Age-related osteoporosis without current pathological fracture: Secondary | ICD-10-CM | POA: Diagnosis present

## 2023-03-02 DIAGNOSIS — I272 Pulmonary hypertension, unspecified: Secondary | ICD-10-CM | POA: Diagnosis present

## 2023-03-02 DIAGNOSIS — I340 Nonrheumatic mitral (valve) insufficiency: Secondary | ICD-10-CM

## 2023-03-02 DIAGNOSIS — Z96652 Presence of left artificial knee joint: Secondary | ICD-10-CM | POA: Diagnosis present

## 2023-03-02 DIAGNOSIS — Z9049 Acquired absence of other specified parts of digestive tract: Secondary | ICD-10-CM

## 2023-03-02 DIAGNOSIS — Z79899 Other long term (current) drug therapy: Secondary | ICD-10-CM

## 2023-03-02 DIAGNOSIS — H35033 Hypertensive retinopathy, bilateral: Secondary | ICD-10-CM | POA: Diagnosis present

## 2023-03-02 DIAGNOSIS — Z881 Allergy status to other antibiotic agents status: Secondary | ICD-10-CM | POA: Diagnosis not present

## 2023-03-02 DIAGNOSIS — Z885 Allergy status to narcotic agent status: Secondary | ICD-10-CM | POA: Diagnosis not present

## 2023-03-02 DIAGNOSIS — Z8249 Family history of ischemic heart disease and other diseases of the circulatory system: Secondary | ICD-10-CM

## 2023-03-02 DIAGNOSIS — Z006 Encounter for examination for normal comparison and control in clinical research program: Secondary | ICD-10-CM

## 2023-03-02 DIAGNOSIS — Z9889 Other specified postprocedural states: Secondary | ICD-10-CM

## 2023-03-02 DIAGNOSIS — M1990 Unspecified osteoarthritis, unspecified site: Secondary | ICD-10-CM | POA: Diagnosis present

## 2023-03-02 DIAGNOSIS — Z1152 Encounter for screening for COVID-19: Secondary | ICD-10-CM

## 2023-03-02 DIAGNOSIS — R0601 Orthopnea: Secondary | ICD-10-CM | POA: Diagnosis present

## 2023-03-02 DIAGNOSIS — I5032 Chronic diastolic (congestive) heart failure: Secondary | ICD-10-CM | POA: Diagnosis present

## 2023-03-02 DIAGNOSIS — Z954 Presence of other heart-valve replacement: Secondary | ICD-10-CM | POA: Diagnosis not present

## 2023-03-02 DIAGNOSIS — I083 Combined rheumatic disorders of mitral, aortic and tricuspid valves: Secondary | ICD-10-CM | POA: Diagnosis not present

## 2023-03-02 DIAGNOSIS — M47817 Spondylosis without myelopathy or radiculopathy, lumbosacral region: Secondary | ICD-10-CM | POA: Diagnosis present

## 2023-03-02 DIAGNOSIS — I110 Hypertensive heart disease with heart failure: Secondary | ICD-10-CM | POA: Diagnosis present

## 2023-03-02 DIAGNOSIS — R011 Cardiac murmur, unspecified: Secondary | ICD-10-CM | POA: Diagnosis present

## 2023-03-02 DIAGNOSIS — Z888 Allergy status to other drugs, medicaments and biological substances status: Secondary | ICD-10-CM | POA: Diagnosis not present

## 2023-03-02 DIAGNOSIS — I119 Hypertensive heart disease without heart failure: Secondary | ICD-10-CM | POA: Diagnosis not present

## 2023-03-02 DIAGNOSIS — Z87442 Personal history of urinary calculi: Secondary | ICD-10-CM | POA: Diagnosis not present

## 2023-03-02 DIAGNOSIS — I3139 Other pericardial effusion (noninflammatory): Secondary | ICD-10-CM | POA: Diagnosis not present

## 2023-03-02 HISTORY — PX: TRANSCATHETER MITRAL EDGE TO EDGE REPAIR: CATH118311

## 2023-03-02 HISTORY — PX: TEE WITHOUT CARDIOVERSION: SHX5443

## 2023-03-02 HISTORY — PX: MITRAL VALVE REPAIR: CATH118311

## 2023-03-02 LAB — ECHO TEE
MV M vel: 4.7 m/s
MV Peak grad: 88.4 mmHg
Radius: 1 cm

## 2023-03-02 SURGERY — MITRAL VALVE REPAIR
Anesthesia: General

## 2023-03-02 MED ORDER — PHENYLEPHRINE 80 MCG/ML (10ML) SYRINGE FOR IV PUSH (FOR BLOOD PRESSURE SUPPORT)
PREFILLED_SYRINGE | INTRAVENOUS | Status: DC | PRN
  Administered 2023-03-02: 80 ug via INTRAVENOUS

## 2023-03-02 MED ORDER — FENTANYL CITRATE (PF) 100 MCG/2ML IJ SOLN: 25.0000 ug | INTRAMUSCULAR | Status: DC | PRN

## 2023-03-02 MED ORDER — HEPARIN (PORCINE) IN NACL 2000-0.9 UNIT/L-% IV SOLN
INTRAVENOUS | Status: DC | PRN
  Administered 2023-03-02 (×4): 1000 mL

## 2023-03-02 MED ORDER — SUGAMMADEX SODIUM 200 MG/2ML IV SOLN
INTRAVENOUS | Status: DC | PRN
  Administered 2023-03-02: 200 mg via INTRAVENOUS

## 2023-03-02 MED ORDER — LIDOCAINE 2% (20 MG/ML) 5 ML SYRINGE
INTRAMUSCULAR | Status: DC | PRN
  Administered 2023-03-02: 40 mg via INTRAVENOUS

## 2023-03-02 MED ORDER — CEFAZOLIN SODIUM-DEXTROSE 2-4 GM/100ML-% IV SOLN
2.0000 g | INTRAVENOUS | Status: AC
  Administered 2023-03-02: 2 g via INTRAVENOUS
  Filled 2023-03-02: qty 100

## 2023-03-02 MED ORDER — ACETAMINOPHEN 325 MG PO TABS: 650.0000 mg | ORAL_TABLET | ORAL | Status: DC | PRN

## 2023-03-02 MED ORDER — EPHEDRINE SULFATE-NACL 50-0.9 MG/10ML-% IV SOSY
PREFILLED_SYRINGE | INTRAVENOUS | Status: DC | PRN
  Administered 2023-03-02: 5 mg via INTRAVENOUS

## 2023-03-02 MED ORDER — ROCURONIUM BROMIDE 10 MG/ML (PF) SYRINGE
PREFILLED_SYRINGE | INTRAVENOUS | Status: DC | PRN
  Administered 2023-03-02 (×2): 50 mg via INTRAVENOUS

## 2023-03-02 MED ORDER — SODIUM CHLORIDE 0.9% FLUSH
3.0000 mL | Freq: Two times a day (BID) | INTRAVENOUS | Status: DC
  Administered 2023-03-02 – 2023-03-03 (×2): 3 mL via INTRAVENOUS

## 2023-03-02 MED ORDER — ONDANSETRON HCL 4 MG/2ML IJ SOLN: 4.0000 mg | Freq: Four times a day (QID) | INTRAMUSCULAR | Status: DC | PRN

## 2023-03-02 MED ORDER — CHLORHEXIDINE GLUCONATE 4 % EX SOLN: 60.0000 mL | Freq: Once | CUTANEOUS | Status: DC

## 2023-03-02 MED ORDER — FENTANYL CITRATE (PF) 100 MCG/2ML IJ SOLN
INTRAMUSCULAR | Status: DC | PRN
  Administered 2023-03-02 (×2): 50 ug via INTRAVENOUS

## 2023-03-02 MED ORDER — CHLORHEXIDINE GLUCONATE 0.12 % MT SOLN
15.0000 mL | Freq: Once | OROMUCOSAL | Status: AC
  Administered 2023-03-02: 15 mL via OROMUCOSAL
  Filled 2023-03-02: qty 15

## 2023-03-02 MED ORDER — SODIUM CHLORIDE 0.9% FLUSH: 3.0000 mL | INTRAVENOUS | Status: DC | PRN

## 2023-03-02 MED ORDER — PROPOFOL 10 MG/ML IV BOLUS
INTRAVENOUS | Status: DC | PRN
  Administered 2023-03-02: 50 mg via INTRAVENOUS

## 2023-03-02 MED ORDER — PHENYLEPHRINE HCL-NACL 20-0.9 MG/250ML-% IV SOLN
INTRAVENOUS | Status: DC | PRN
  Administered 2023-03-02: 15 ug/min via INTRAVENOUS

## 2023-03-02 MED ORDER — DEXAMETHASONE SODIUM PHOSPHATE 10 MG/ML IJ SOLN
INTRAMUSCULAR | Status: DC | PRN
  Administered 2023-03-02: 5 mg via INTRAVENOUS

## 2023-03-02 MED ORDER — LABETALOL HCL 5 MG/ML IV SOLN
INTRAVENOUS | Status: AC
  Filled 2023-03-02: qty 4

## 2023-03-02 MED ORDER — CARVEDILOL 25 MG PO TABS
25.0000 mg | ORAL_TABLET | Freq: Two times a day (BID) | ORAL | Status: DC
  Administered 2023-03-02 – 2023-03-03 (×2): 25 mg via ORAL
  Filled 2023-03-02 (×2): qty 1

## 2023-03-02 MED ORDER — PANTOPRAZOLE SODIUM 40 MG PO TBEC
40.0000 mg | DELAYED_RELEASE_TABLET | Freq: Every day | ORAL | Status: DC
  Administered 2023-03-02 – 2023-03-03 (×2): 40 mg via ORAL
  Filled 2023-03-02 (×2): qty 1

## 2023-03-02 MED ORDER — PROMETHAZINE HCL 25 MG/ML IJ SOLN: 6.2500 mg | INTRAMUSCULAR | Status: DC | PRN

## 2023-03-02 MED ORDER — AMISULPRIDE (ANTIEMETIC) 5 MG/2ML IV SOLN: 10.0000 mg | Freq: Once | INTRAVENOUS | Status: DC | PRN

## 2023-03-02 MED ORDER — LABETALOL HCL 5 MG/ML IV SOLN
10.0000 mg | INTRAVENOUS | Status: DC | PRN
  Administered 2023-03-02: 10 mg via INTRAVENOUS

## 2023-03-02 MED ORDER — ONDANSETRON HCL 4 MG/2ML IJ SOLN
INTRAMUSCULAR | Status: DC | PRN
  Administered 2023-03-02: 4 mg via INTRAVENOUS

## 2023-03-02 MED ORDER — LISINOPRIL 10 MG PO TABS
10.0000 mg | ORAL_TABLET | Freq: Every day | ORAL | Status: DC
  Administered 2023-03-03: 10 mg via ORAL
  Filled 2023-03-02: qty 1

## 2023-03-02 MED ORDER — ASPIRIN 81 MG PO CHEW
81.0000 mg | CHEWABLE_TABLET | Freq: Every day | ORAL | Status: DC
  Filled 2023-03-02: qty 1

## 2023-03-02 MED ORDER — HYDRALAZINE HCL 20 MG/ML IJ SOLN: 5.0000 mg | INTRAMUSCULAR | Status: DC | PRN

## 2023-03-02 MED ORDER — SODIUM CHLORIDE 0.9 % IV SOLN: 250.0000 mL | INTRAVENOUS | Status: DC | PRN

## 2023-03-02 MED ORDER — LACTATED RINGERS IV SOLN: INTRAVENOUS | Status: DC

## 2023-03-02 MED ORDER — HEPARIN SODIUM (PORCINE) 1000 UNIT/ML IJ SOLN
INTRAMUSCULAR | Status: DC | PRN
  Administered 2023-03-02: 6000 [IU] via INTRAVENOUS
  Administered 2023-03-02: 3000 [IU] via INTRAVENOUS

## 2023-03-02 MED ORDER — ORAL CARE MOUTH RINSE: 15.0000 mL | OROMUCOSAL | Status: DC | PRN

## 2023-03-02 MED ORDER — ACETAMINOPHEN 10 MG/ML IV SOLN: 1000.0000 mg | Freq: Once | INTRAVENOUS | Status: DC | PRN

## 2023-03-02 MED ORDER — FENTANYL CITRATE (PF) 100 MCG/2ML IJ SOLN
INTRAMUSCULAR | Status: AC
  Filled 2023-03-02: qty 2

## 2023-03-02 MED ORDER — ACETAMINOPHEN 500 MG PO TABS
1000.0000 mg | ORAL_TABLET | Freq: Once | ORAL | Status: AC
  Administered 2023-03-02: 1000 mg via ORAL
  Filled 2023-03-02: qty 2

## 2023-03-02 MED ORDER — SODIUM CHLORIDE 0.9 % IV SOLN: INTRAVENOUS | Status: DC

## 2023-03-02 MED ORDER — MENTHOL 3 MG MT LOZG
1.0000 | LOZENGE | OROMUCOSAL | Status: DC | PRN
  Administered 2023-03-02: 3 mg via ORAL
  Filled 2023-03-02: qty 9

## 2023-03-02 MED ORDER — CHLORHEXIDINE GLUCONATE 4 % EX SOLN: 30.0000 mL | CUTANEOUS | Status: DC

## 2023-03-02 SURGICAL SUPPLY — 17 items
CATH MITRA STEERABLE GUIDE (CATHETERS) IMPLANT
CLIP MITRA G4 DELIVERY SYS NTW (Clip) IMPLANT
CLOSURE PERCLOSE PROSTYLE (VASCULAR PRODUCTS) IMPLANT
ELECT DEFIB PAD ADLT CADENCE (PAD) IMPLANT
KIT HEART LEFT (KITS) ×2 IMPLANT
KIT MICROPUNCTURE NIT STIFF (SHEATH) IMPLANT
KIT VERSACROSS LRG ACCESS (CATHETERS) IMPLANT
PACK CARDIAC CATHETERIZATION (CUSTOM PROCEDURE TRAY) ×1 IMPLANT
SHEATH DILAT COONS TAPER 22F (SHEATH) IMPLANT
SHEATH PINNACLE 8F 10CM (SHEATH) IMPLANT
SHEATH PROBE COVER 6X72 (BAG) ×1 IMPLANT
STOPCOCK MORSE 400PSI 3WAY (MISCELLANEOUS) ×6 IMPLANT
SYSTEM MITRACLIP G4 (SYSTAGENIX WOUND MANAGEMENT) IMPLANT
TRANSDUCER W/STOPCOCK (MISCELLANEOUS) ×1 IMPLANT
TUBING ART PRESS 72  MALE/FEM (TUBING) ×1
TUBING ART PRESS 72 MALE/FEM (TUBING) ×1 IMPLANT
WIRE EMERALD 3MM-J .035X150CM (WIRE) IMPLANT

## 2023-03-02 NOTE — Anesthesia Preprocedure Evaluation (Addendum)
Anesthesia Evaluation  Patient identified by MRN, date of birth, ID band Patient awake    Reviewed: Allergy & Precautions, NPO status , Patient's Chart, lab work & pertinent test results, reviewed documented beta blocker date and time   Airway Mallampati: I  TM Distance: <3 FB Neck ROM: Full    Dental  (+) Teeth Intact, Dental Advisory Given   Pulmonary neg pulmonary ROS   breath sounds clear to auscultation       Cardiovascular hypertension, Pt. on home beta blockers and Pt. on medications + Valvular Problems/Murmurs MR  Rhythm:Regular Rate:Normal  Echo: 1. Left ventricular ejection fraction, by estimation, is 60 to 65%. The  left ventricle has normal function. The left ventricle has no regional  wall motion abnormalities. There is mild concentric left ventricular  hypertrophy. Left ventricular diastolic  parameters are consistent with Grade II diastolic dysfunction  (pseudonormalization).   2. Right ventricular systolic function is normal. The right ventricular  size is mildly enlarged. There is severely elevated pulmonary artery  systolic pressure. The estimated right ventricular systolic pressure is  62.9 mmHg.   3. Left atrial size was severely dilated.   4. Small ASD from MitraClip procedure with left=>right flow.   5. Status post mTEER with MitraClip in A2-P2 position. Severe residual  anteriorly-directed mitral valve regurgitation. PISA ERO 0.56 cm^2. Mild  mitral stenosis. The mean mitral valve gradient is 5.0 mmHg. Procedure  Date: 11/06/2020.   6. The tricuspid valve is abnormal. Tricuspid valve regurgitation is  moderate.   7. The aortic valve is tricuspid. Aortic valve regurgitation is trivial.  No aortic stenosis is present.   8. Right atrial size was moderately dilated.   9. The inferior vena cava is dilated in size with <50% respiratory  variability, suggesting right atrial pressure of 15 mmHg.      Neuro/Psych negative neurological ROS  negative psych ROS   GI/Hepatic Neg liver ROS,GERD  Medicated,,  Endo/Other  negative endocrine ROS    Renal/GU negative Renal ROS     Musculoskeletal  (+) Arthritis ,    Abdominal   Peds  Hematology negative hematology ROS (+)   Anesthesia Other Findings   Reproductive/Obstetrics                             Anesthesia Physical Anesthesia Plan  ASA: 4  Anesthesia Plan: General   Post-op Pain Management: Tylenol PO (pre-op)*   Induction: Intravenous  PONV Risk Score and Plan: 3 and Ondansetron and Treatment may vary due to age or medical condition  Airway Management Planned: Oral ETT  Additional Equipment: Arterial line  Intra-op Plan:   Post-operative Plan: Extubation in OR  Informed Consent: I have reviewed the patients History and Physical, chart, labs and discussed the procedure including the risks, benefits and alternatives for the proposed anesthesia with the patient or authorized representative who has indicated his/her understanding and acceptance.       Plan Discussed with: CRNA  Anesthesia Plan Comments:        Anesthesia Quick Evaluation

## 2023-03-02 NOTE — Anesthesia Procedure Notes (Signed)
Arterial Line Insertion Start/End5/05/2023 7:40 AM, 03/02/2023 7:50 AM Performed by: Lelon Perla, CRNA, CRNA  Patient location: Pre-op. Preanesthetic checklist: patient identified, IV checked, site marked, risks and benefits discussed, surgical consent, monitors and equipment checked, pre-op evaluation, timeout performed and anesthesia consent Lidocaine 1% used for infiltration Right, radial was placed Catheter size: 20 G Hand hygiene performed  and maximum sterile barriers used   Attempts: 1 Procedure performed without using ultrasound guided technique. Following insertion, dressing applied and Biopatch. Post procedure assessment: normal and unchanged

## 2023-03-02 NOTE — Transfer of Care (Signed)
Immediate Anesthesia Transfer of Care Note  Patient: Erin [name]  Procedure(s) Performed: MITRAL VALVE REPAIR TRANSESOPHAGEAL ECHOCARDIOGRAM  Patient Location: Cath Lab  Anesthesia Type:General  Level of Consciousness: awake, alert , and oriented  Airway & Oxygen Therapy: Patient Spontanous Breathing and Patient connected to nasal cannula oxygen  Post-op Assessment: Report given to RN and Post -op Vital signs reviewed and stable  Post vital signs: Reviewed and stable  Last Vitals:  Vitals Value Taken Time  BP 155/69 03/02/23 1052  Temp    Pulse 66 03/02/23 1055  Resp 14 03/02/23 1055  SpO2 100 % 03/02/23 1055  Vitals shown include unvalidated device data.  Last Pain:  Vitals:   03/02/23 0711  TempSrc:   PainSc: 0-No pain         Complications: There were no known notable events for this encounter.

## 2023-03-02 NOTE — Interval H&P Note (Signed)
History and Physical Interval Note:  03/02/2023 7:40 AM  Erin [name]  has presented today for surgery, with the diagnosis of severe mitral insufficiency.  The various methods of treatment have been discussed with the patient and family. After consideration of risks, benefits and other options for treatment, the patient has consented to  Procedure(s): MITRAL VALVE REPAIR (N/A) TRANSESOPHAGEAL ECHOCARDIOGRAM (N/A) as a surgical intervention.  The patient's history has been reviewed, patient examined, no change in status, stable for surgery.  I have reviewed the patient's chart and labs.  Questions were answered to the patient's satisfaction.     Tonny Bollman

## 2023-03-02 NOTE — Discharge Summary (Incomplete)
[facility]   MULTIDISCIPLINARY HEART VALVE TEAM  Discharge Summary    Patient ID: Erin [name] MRN: [id number]; DOB: [birth year]  Admit date: 03/02/2023 Discharge date: 03/03/2023  Primary Care Provider: Jeoffrey Massed, MD  Primary Cardiologist: Tonny Bollman, MD   Discharge Diagnoses    Principal Problem:   S/P mitral valve clip implantation Active Problems:   Essential hypertension   Severe mitral valve regurgitation   Severe pulmonary hypertension (HCC)   Hypertension   Chronic diastolic heart failure (HCC)   Allergies Allergies  Allergen Reactions   Advil [Ibuprofen]     Bleeds excessively    Barbiturates Nausea And Vomiting   Demerol Nausea And Vomiting   Doxycycline Nausea Only   Hydrochlorothiazide Other (See Comments)    unknown   Irbesartan     HA   Losartan Potassium     hair loss   Olmesartan Medoxomil     hair loss   Telmisartan     cramps    Diagnostic Studies/Procedures    03/02/23  TRANSESOPHAGEAL ECHOCARDIOGRAM  MITRAL VALVE REPAIR  Conclusion Successful transcatheter edge-to-edge repair of the mitral valve with a single MitraClip NTW device, reducing mitral regurgitation from 4+ to 1+, device placed A2/P2 (lateral to the previously implanted clip device) _____________    Echo 06/03/23: completed but pending formal read at the time of discharge   History of Present Illness     Erin [name] is a 88 y.o. female with a history of HTN, OA, chronic diastolic CHF, severe pulmonary HTN, moderate TR and mitral valve prolapse with severe degenerative mitral regurgitation who underwent transcatheter edge-to-edge repair of the mitral valve in 10/2020 now with severe residual MR who presented to [name] on 03/02/23 for planned redo mTEER.   The patient underwent transcatheter edge-to-edge repair of the mitral valve for treatment of prolapse and flail of the posterior leaflet in January 2022. Initially, she had an acceptable result with mild to  moderate residual mitral regurgitation. However, on follow-up imaging she was demonstrated to have severe mitral regurgitation confirmed by TEE assessment.  She was scheduled for redo mitral transcatheter edge-to-edge repair, but the patient canceled her procedure. She then returned to the office with progressive shortness of breath and orthopnea and decided to proceed with surgery, which was set up for 03/02/2023.  [name] Course     Consultants: none   Severe MR: successful transcatheter edge-to-edge repair of the mitral valve with a single MitraClip NTW device, reducing mitral regurgitation from 4+ to 1+, device placed A2/P2 (lateral to the previously implanted clip device). POD1 echo completed but pending formal read. MR looks mild-moderate with mean gradient of 7 mm hg. TR severe. Started on a baby aspirin 81mg  daily. Plan for discharge home today with close follow up in the office next week.   Chronic diastolic CHF: appears euvolemic. Resume home meds with PRN lasix.   HTN: Bp has been mild-moderately elevated. Resume home meds and follow as outpatient.   Pulmonary HTN: will continue to follow.  _____________  Discharge Vitals Blood pressure 136/89, pulse 71, temperature 98.4 F (36.9 C), temperature source Oral, resp. rate 13, height 5\' 3"  (1.6 m), weight 55 kg, SpO2 97 %.  Filed Weights   03/02/23 0659 03/02/23 1153  Weight: 52.2 kg 55 kg   GEN: Well nourished, well developed, in no acute distress HEENT: normal Neck: no JVD or masses Cardiac: RRR; no murmurs, rubs, or gallops,no edema  Respiratory:  clear to auscultation bilaterally, normal  work of breathing GI: soft, nontender, nondistended, + BS MS: no deformity or atrophy Skin: warm and dry, no rash.  Groin site clear without hematoma or ecchymosis  Neuro:  Alert and Oriented x 3, Strength and sensation are intact Psych: euthymic mood, full affect   Labs & Radiologic Studies    CBC Recent Labs    03/03/23 0040  WBC  9.5  HGB 12.1  HCT 39.0  MCV 90.7  PLT 225   Basic Metabolic Panel Recent Labs    16/10/96 0040  NA 137  K 4.3  CL 105  CO2 25  GLUCOSE 133*  BUN 20  CREATININE 0.65  CALCIUM 7.9*   Liver Function Tests No results for input(s): "AST", "ALT", "ALKPHOS", "BILITOT", "PROT", "ALBUMIN" in the last 72 hours.  No results for input(s): "LIPASE", "AMYLASE" in the last 72 hours. Cardiac Enzymes No results for input(s): "CKTOTAL", "CKMB", "CKMBINDEX", "TROPONINI" in the last 72 hours. BNP Invalid input(s): "POCBNP" D-Dimer No results for input(s): "DDIMER" in the last 72 hours. Hemoglobin A1C No results for input(s): "HGBA1C" in the last 72 hours. Fasting Lipid Panel No results for input(s): "CHOL", "HDL", "LDLCALC", "TRIG", "CHOLHDL", "LDLDIRECT" in the last 72 hours. Thyroid Function Tests No results for input(s): "TSH", "T4TOTAL", "T3FREE", "THYROIDAB" in the last 72 hours.  Invalid input(s): "FREET3" _____________  ECHO TEE  Result Date: 03/02/2023    TRANSESOPHOGEAL ECHO REPORT   Patient Name:   Erin [name] Date of Exam: 03/02/2023 Medical Rec #:  [id number]       Height:       63.0 in Accession #:    [id number]      Weight:       115.0 lb Date of Birth:  May 12, 1935        BSA:          1.528 m Patient Age:    88 years        BP:           140/57 mmHg Patient Gender: F               HR:           58 bpm. Exam Location:  Inpatient Procedure: Transesophageal Echo, 3D Echo, Color Doppler and Cardiac Doppler Indications:     Mitral Valve Regurgitation i34.1  History:         Patient has prior history of Echocardiogram examinations, most                  recent 11/08/2022. Risk Factors:Hypertension. Mitraclip XTW                  placed A2P2 11/06/20.  Sonographer:     Irving Burton Senior RDCS Referring Phys:  [phone] Pilar Westergaard Diagnosing Phys: Charlton Haws MD  Sonographer Comments: Mitraclip Procedure PROCEDURE: After discussion of the risks and benefits of a TEE, an informed consent was  obtained from the patient. The transesophogeal probe was passed without difficulty through the esophogus of the patient. Sedation performed by different physician. The patient was monitored while under deep sedation. The patient developed no complications during the procedure.  IMPRESSIONS  1. Left ventricular ejection fraction, by estimation, is 55 to 60%. The left ventricle has normal function.  2. Right ventricular systolic function is mildly reduced. The right ventricular size is mildly enlarged.  3. Left atrial size was severely dilated. No left atrial/left atrial appendage thrombus was detected.  4. Right atrial size was moderately dilated.  5.  Prior XTW clip to A2/P2 intact. Residual posterior P2 leaflet flail/prolapse with severe anteriorly directed MR. Extensive 3D imaging done to guid placement of of a 2 nd NTW clip lateral to original one. MR reduced from plus 4 to plus 1. Mean diastolic graient 5 peak 11 mmHg at HR of 47 bpm. The mitral valve is myxomatous. Severe mitral valve regurgitation.  6. Tricuspid valve regurgitation is severe.  7. The aortic valve is tricuspid. There is mild calcification of the aortic valve. There is mild thickening of the aortic valve. Aortic valve regurgitation is trivial. Aortic valve sclerosis is present, with no evidence of aortic valve stenosis.  8. Evidence of atrial level shunting detected by color flow Doppler. Post trans septal only left to right shunting. FINDINGS  Left Ventricle: Left ventricular ejection fraction, by estimation, is 55 to 60%. The left ventricle has normal function. The left ventricular internal cavity size was normal in size. Right Ventricle: The right ventricular size is mildly enlarged. Right vetricular wall thickness was not assessed. Right ventricular systolic function is mildly reduced. Left Atrium: Left atrial size was severely dilated. No left atrial/left atrial appendage thrombus was detected. Right Atrium: Right atrial size was moderately  dilated. Pericardium: There is no evidence of pericardial effusion. Mitral Valve: Prior XTW clip to A2/P2 intact. Residual posterior P2 leaflet flail/prolapse with severe anteriorly directed MR. Extensive 3D imaging done to guid placement of of a 2 nd NTW clip lateral to original one. MR reduced from plus 4 to plus 1. Mean diastolic graient 5 peak 11 mmHg at HR of 47 bpm. The mitral valve is myxomatous. Severe mitral valve regurgitation. Tricuspid Valve: The tricuspid valve is normal in structure. Tricuspid valve regurgitation is severe. Aortic Valve: The aortic valve is tricuspid. There is mild calcification of the aortic valve. There is mild thickening of the aortic valve. Aortic valve regurgitation is trivial. Aortic valve sclerosis is present, with no evidence of aortic valve stenosis. Pulmonic Valve: The pulmonic valve was normal in structure. Pulmonic valve regurgitation is mild. Aorta: The aortic root is normal in size and structure. IAS/Shunts: Evidence of atrial level shunting detected by color flow Doppler. Post trans septal only left to right shunting. Additional Comments: Spectral Doppler performed. MR Peak grad:    88.4 mmHg MR Mean grad:    43.0 mmHg MR Vmax:         470.00 cm/s MR Vmean:        285.0 cm/s MR PISA:         6.28 cm MR PISA Eff ROA: 47 mm MR PISA Radius:  1.00 cm Charlton Haws MD Electronically signed by Charlton Haws MD Signature Date/Time: 03/02/2023/2:56:24 PM    Final    CARDIAC CATHETERIZATION  Result Date: 03/02/2023 Successful transcatheter edge-to-edge repair of the mitral valve with a single MitraClip NTW device, reducing mitral regurgitation from 4+ to 1+, device placed A2/P2 (lateral to the previously implanted clip device) See full Op Note for details   EP STUDY  Result Date: 03/02/2023 Successful transcatheter edge-to-edge repair of the mitral valve with a single MitraClip NTW device, reducing mitral regurgitation from 4+ to 1+, device placed A2/P2 (lateral to the  previously implanted clip device) See full Op Note for details    Disposition   Pt is being discharged home today in good condition.  Follow-up Plans & Appointments     Follow-up Information     Filbert Schilder, NP. Go on 03/09/2023.   Specialty: Cardiology Why: @ 9:30am, please  arrive at least 10 minutes early. Contact information: [address] [place] [address] [phone]                  Discharge Medications   Allergies as of 03/03/2023       Reactions   Advil [ibuprofen]    Bleeds excessively    Barbiturates Nausea And Vomiting   Demerol Nausea And Vomiting   Doxycycline Nausea Only   Hydrochlorothiazide Other (See Comments)   unknown   Irbesartan    HA   Losartan Potassium    hair loss   Olmesartan Medoxomil    hair loss   Telmisartan    cramps        Medication List     STOP taking these medications    pantoprazole 40 MG tablet Commonly known as: PROTONIX       TAKE these medications    amoxicillin 500 MG tablet Commonly known as: AMOXIL Take 4 tablets (2,000 mg total) by mouth as directed. Take 4 tablets (2,000 mg total) by mouth 1 hour prior to all dental visits.   aspirin 81 MG chewable tablet Chew 1 tablet (81 mg total) by mouth daily. Notes to patient: YOU DO NOT HAVE TO CHEW THIS!   azelastine 0.1 % nasal spray Commonly known as: ASTELIN Place 2 sprays into both nostrils 2 (two) times daily. What changed:  when to take this reasons to take this   CALCIUM 600 + D PO Take 1 tablet by mouth daily.   carvedilol 25 MG tablet Commonly known as: COREG Take 1 tablet (25 mg total) by mouth in the morning and at bedtime.   cyanocobalamin 1000 MCG tablet Commonly known as: VITAMIN B12 Take 1,000 mcg by mouth daily.   furosemide 20 MG tablet Commonly known as: LASIX Take 1 tablet (20 mg total) by mouth daily as needed for edema.   ibandronate 150 MG tablet Commonly known as: Boniva Take 1 tablet (150 mg  total) by mouth every 30 (thirty) days. Take in the morning with a full glass of water, on an empty stomach, and do not take anything else by mouth or lie down for the next 30 min.   lisinopril 10 MG tablet Commonly known as: ZESTRIL Take 1 tablet by mouth once daily   multivitamin with minerals Tabs tablet Take 1 tablet by mouth in the morning.   Muro 128 5 % ophthalmic ointment Generic drug: sodium chloride Place 1 Application into both eyes at bedtime as needed (dry eyes).   nitroGLYCERIN 0.2 mg/hr patch Commonly known as: NITRODUR - Dosed in mg/24 hr 1/2 patch over L Achilles tendon bid What changed:  how much to take how to take this when to take this reasons to take this additional instructions   omeprazole 20 MG capsule Commonly known as: PRILOSEC Take 20 mg by mouth daily.   potassium chloride 10 MEQ tablet Commonly known as: KLOR-CON Take 10 mEq by mouth daily as needed (with furosemide (fluid retention)).   PRESERVISION AREDS PO Take 1 tablet by mouth in the morning and at bedtime.   vitamin C 1000 MG tablet Take 1,000 mg by mouth daily.   vitamin E 180 MG (400 UNITS) capsule Take 400 Units by mouth in the morning.            Outstanding Labs/Studies   none  Duration of Discharge Encounter   Greater than 30 minutes including physician time.  Signed, Cline Crock, PA-C 03/03/2023, 10:38  AM [phone]  Pt DC'd prior to my evaluation. I saw her last night and she was medically stable. Echo reviewed and shows good MR reduction from severe at baseline to mild this morning on her POD #1 echo. Otherwise as above.  Tonny Bollman 03/03/2023 1:18 PM

## 2023-03-02 NOTE — Progress Notes (Signed)
  [facility]   MULTIDISCIPLINARY HEART VALVE TEAM  Patient doing well s/p mTEER. She is hemodynamically stable. Groin site clear. Plan for early ambulation after bedrest completed and hopeful discharge tomorrow.   Cline Crock PA-C  [facility]  Pager [phone]

## 2023-03-02 NOTE — Discharge Instructions (Signed)
Home Care Following Your MitraClip Procedure      If you have any questions or concerns you can call the structural heart office at [phone] during normal business hours 8am-4pm. If you have an urgent need after hours or on the weekend, please call [phone] to talk to the on call provider for general cardiology. If you have an emergency that requires immediate attention, please call 911.   Groin Site Care Refer to this sheet in the next few weeks. These instructions provide you with information on caring for yourself after your procedure. Your caregiver may also give you more specific instructions. Your treatment has been planned according to current medical practices, but problems sometimes occur. Call your caregiver if you have any problems or questions after your procedure. HOME CARE INSTRUCTIONS You may shower 24 hours after the procedure. Remove the bandage (dressing) and gently wash the site with plain soap and water. Gently pat the site dry.  Do not apply powder or lotion to the site.  Do not sit in a bathtub, swimming pool, or whirlpool for 5 to 7 days.  No bending, squatting, or lifting anything over 10 pounds (4.5 kg) as directed by your caregiver.  Inspect the site at least twice daily.  Do not drive home if you are discharged the same day of the procedure. Have someone else drive you.  You may drive 72 hours after the procedure unless otherwise instructed by your caregiver.  What to expect: Any bruising will usually fade within 1 to 2 weeks.  Blood that collects in the tissue (hematoma) may be painful to the touch. It should usually decrease in size and tenderness within 1 to 2 weeks.  SEEK IMMEDIATE MEDICAL CARE IF: You have unusual pain at the groin site or down the affected leg.  You have redness, warmth, swelling, or pain at the groin site.  You have drainage (other than a small amount of blood on the dressing).  You have chills.  You have a fever or persistent  symptoms for more than 72 hours.  You have a fever and your symptoms suddenly get worse.  Your leg becomes pale, cool, tingly, or numb.  You have bleeding from the site. Hold pressure on the site until it subsides.    After MitraClip Checklist  Check  Test Description   Follow up appointment in 1-2 weeks  Most of our patients will see our structural heart APP or your primary cardiologist within 1-2 weeks. Your incision site will be checked and you will be cleared to resume all normal activities if you are doing well.     1 month echo and follow up  You will have an echo to check on your heart valve clip and be seen back in the office by our structural heart APP   Follow up with your primary cardiologist You will need to be seen by your primary cardiologist in the following 3-6 months after your 1 month appointment in the valve clinic. Often times your blood thinners will be changed. This is decided on a case by case basis.    1 year echo and follow up You will have another echo to check on your heart valve after one year and be seen back in the office by our structural heart APP. This your last structural heart visit.   Bacterial endocarditis prophylaxis  You will have to take antibiotics for the rest of your life before all dental procedures (even dental cleanings) to protect your heart valve   from potential infection. Antibiotics are also required before some surgeries. Please check with your cardiologist before scheduling any surgeries. Also, please make sure to tell us if you have a penicillin allergy as you will require an alternative antibiotic.    ______________  Your Implant Identification Card Following your procedure, you will receive an Implant Identification Card, which your doctor will fill out and which you must carry with you at all times. Show your Implant Identification Card if you report to an emergency room. This card identifies you as a patient who has had a MitraClip device  implanted. If you require a magnetic resonance imaging (MRI) scan, tell your doctor or MRI technician that you have a MitraClip device implanted. Test results indicate that patients with the MitraClip device can safely undergo MRI scans under certain conditions described on the card.  

## 2023-03-02 NOTE — Op Note (Signed)
[facility]   MULTIDISCIPLINARY HEART TEAM  Date of Procedure:  03/02/2023  Preoperative Diagnosis: Severe Symptomatic Mitral Regurgitation (Stage D)  Postoperative Diagnosis: Same   Procedure Performed: Ultrasound-guided right transfemoral venous access Double PreClose right femoral vein Transseptal puncture using [place] Versacross RF needle Mitral valve repair with MitraClip NTW  Surgeon: Micheline Chapman, MD  Co-Surgeon: Alverda Skeans, MD  Echocardiographer: Charlton Haws, MD  Anesthesiologist: Shona Simpson, MD  Device Implant: Mitraclip NTW  Procedural Indication: Severe Non-rheumatic Mitral Regurgitation (Stage D)   Brief History: The patient is an 88 year old woman who initially presented with severe symptomatic mitral regurgitation and 2022.  She underwent transcatheter edge-to-edge repair of the mitral valve with MitraClip XTW device.  She later developed recurrent severe mitral regurgitation with a prolapse segment lateral to the indwelling clip device.  After review of all available treatment options, the patient consents to repeat transcatheter edge-to-edge repair after a shared decision-making conversation of treatment options.  She has New [place] Heart Association functional class III symptoms.  Echo Findings: Preop:  Normal LV systolic function Severe MR secondary to posterior leaflet flail/prolapse, Grade 4+ Post-op:  Unchanged LV systolic function Trace to 1+ residual MR  Procedural Details: Prep The patient is brought to the cardiac catheterization lab in the fasting state. General anesthesia is induced. The patient is prepped in the bilateral groin area.  Hemodynamics are monitored via a radial artery line.   Venous Access Using ultrasound guidance, the right femoral vein is punctured. Ultrasound images are captured and stored in the patient's chart. The vein is dilated and 2 Perclose devices are deplyed at 10' and 2' positions to 'Preclose'  the femoral vein. An 8 Fr sheath is inserted.  Transseptal Puncture A [place] Versacross wire is advanced into the SVC A [place] transseptal dilator is advanced into the SVC, and the VersaCross RF wire is retracted into the dilator  The transseptal sheath is retracted into the RA under fluoroscopic and echo guidance to obtain position on the posterior fossa where echo measurements are made to assure appropriate access to the mitral valve.  The dilator crossed the septum at the previous septal puncture site and measurements were made demonstrating appropriate position across the interatrial septum. Weight based IV heparin is administered and a therapeutic ACT > 250 is confirmed  Steerable Guide Catheter Insertion The VersaCross wire is positioned at the left upper pulmonary vein The femoral vein is progressively dilated and the 24 Fr Steerable guide catheter is inserted and then directed across the interatrial septum over the wire. Position is confirmed approximately 3 cm into the left atrium The guide is de-aired   MitraClip Insertion The MitraClip NTW is prepped per protocol and inserted via the introducer into the steerable guide catheter The Clip Delivery System (CDS) is advanced under fluoro and echo guidance so that the sleeve markers are evenly spaced on each side of the guide marker  MitraClip Positioning in the Left Atrium (Supravalvular Alignment) M-knob is applied to bring the Clip towards the mitral valve. Echo guidance is used to avoid contact with LA structures. The Clip arms are opened to 180 degrees 2D and 3D TEE imaging is performed in multiple planes and the Clip is positioned and aligned above the valve using standard steering techniques   Entry into the Left Ventricle and Mitral Valve Leaflet Grasp The Clip is advanced across the mitral valve into the LV, maintaining proper orientation.  Caution is taken to avoid any interaction with the indwelling clip  device.  The device is  positioned just lateral to the indwelling device.  This is directly over the prolapsed posterior leaflet. The Clip arms are opened to 120 degrees and the Clip is slowly retracted  Capture of both the anterior and posterior leaflets are visualized by echo and the grippers are dropped  MitraClip Deployment After extensive echo evaluation, reduction in mitral regurgitation is felt to be adequate.  Transmitral gradient is in acceptable range. Following standard protocol, the lock line is removed after testing the lock mechanism. The lock is rechecked and is shown to be intact. The MitraClip device is deployed and the clip delivery system is removed under echo guidance with caution taken to avoid contact with LA structures  Device Removal The clip delivery system is removed under echo guidance The steerable guide catheter is retracted into the right atrium and the interatrial septum is assessed by echo without evidence of right-to-left shunting or large ASD  Hemostasis The guide catheter is removed over a 0.035" wire and the Perclose sutures are tightened with complete hemostasis and no evidence of hematoma  Estimated blood loss: minimal  There are no immediate procedural complications. The patient is transferred to the post-procedure recovery area in stable condition.   Final conclusion: Successful transcatheter edge-to-edge repair of the mitral valve using a single MitraClip G4 NTW device position lateral A2/P2, reducing 4+ mitral regurgitation to 1+ post procedure.  Tonny Bollman 03/02/2023 10:58 AM

## 2023-03-02 NOTE — Anesthesia Postprocedure Evaluation (Signed)
Anesthesia Post Note  Patient: Erin [name]  Procedure(s) Performed: MITRAL VALVE REPAIR TRANSESOPHAGEAL ECHOCARDIOGRAM     Patient location during evaluation: PACU Anesthesia Type: General Level of consciousness: awake and alert Pain management: pain level controlled Vital Signs Assessment: post-procedure vital signs reviewed and stable Respiratory status: spontaneous breathing, nonlabored ventilation, respiratory function stable and patient connected to nasal cannula oxygen Cardiovascular status: blood pressure returned to baseline and stable Postop Assessment: no apparent nausea or vomiting Anesthetic complications: no  There were no known notable events for this encounter.  Last Vitals:  Vitals:   03/02/23 1130 03/02/23 1153  BP: (!) 150/55 137/79  Pulse: 68 71  Resp: 20 17  Temp:  (!) 35.7 C  SpO2: 100% 92%    Last Pain:  Vitals:   03/02/23 1153  TempSrc: Axillary  PainSc:                  Shelton Silvas

## 2023-03-02 NOTE — Op Note (Signed)
    PROCEDURE:  Transcatheter edge to edge mitral valve repair (TEER) INDICATION: Severe symptomatic mitral regurgitation (Stage D)  SURGEON:  Tonny Bollman, MD CO-SURGEON: Alverda Skeans, MD  PROCEDURAL DETAILS: General anesthesia is induced.  The patient is prepped and draped.  Baseline transesophageal echo images are obtained and confirm appropriate anatomy for transcatheter edge-to-edge mitral valve repair.  Using vascular ultrasound guidance, the right common femoral vein is accessed via a front wall puncture.  2 Perclose sutures are deployed and an 8 [place] sheath is inserted.  A versa cross wire is advanced into the SVC.  Heparin is administered and a therapeutic ACT is achieved.  Transseptal puncture is performed over the mid posterior portion of the fossa.  The septum is dilated while the MitraClip steerable guide catheter is prepped.  After progressively dilating the right common femoral vein, the 24 French MitraClip steerable guide catheter is inserted and advanced across the interatrial septum.  The dilator and the versa cross wire were carefully removed and the guide tip is appropriately positioned approximately 3 cm across the interatrial septum.  A MitraClip NTW device is prepped per protocol.  The device is inserted through the steerable guide catheter with caution taken to avoid air entrapment.  The MitraClip device is positioned above the mitral valve after applying appropriate curve to the guide catheter.  The MitraClip device is then oriented coaxially with the anterior and posterior leaflets of the mitral valve.  Low tidal volume ventilation is initiated.  The clip device is advanced across the mitral valve and pulled back until capture of both the anterior and posterior leaflets is achieved.  Grippers are dropped and the clip is closed under TEE guidance.  Careful TEE assessment is performed and reduction in mitral valve regurgitation is felt to be appropriate.  Leaflet insertion is  verified using 3D imaging.  The MitraClip device is deployed using normal technique and the clip delivery system is removed.  After full TEE assessment, the procedural result is felt to be adequate with reduction of mitral regurgitation to trace to 1+.   PROCEDURE COMPLETION: The steerable guide catheter is pulled back into the right atrium and the interatrial septum is assessed with TEE.  There is no significant septal injury seen and no right to left shunting.  The guide catheter is removed and the Perclose sutures are tightened.  Protamine is administered.  CONCLUSION: Successful transcatheter edge-to-edge mitral valve repair under fluoroscopic and echo guidance, reducing baseline 4+ mitral regurgitation to tract to 1+, clip positioned A2/P2.  Orbie Pyo 03/02/2023 10:59 AM

## 2023-03-02 NOTE — Anesthesia Procedure Notes (Signed)
Procedure Name: Intubation Date/Time: 03/02/2023 8:58 AM  Performed by: Lelon Perla, CRNAPre-anesthesia Checklist: Patient identified, Emergency Drugs available, Suction available and Patient being monitored Patient Re-evaluated:Patient Re-evaluated prior to induction Oxygen Delivery Method: Circle system utilized Preoxygenation: Pre-oxygenation with 100% oxygen Induction Type: IV induction Ventilation: Mask ventilation without difficulty Laryngoscope Size: Glidescope and 3 Grade View: Grade I Tube type: Oral Tube size: 7.0 mm Number of attempts: 1 Airway Equipment and Method: Video-laryngoscopy and Rigid stylet Placement Confirmation: ETT inserted through vocal cords under direct vision, positive ETCO2 and breath sounds checked- equal and bilateral Secured at: 21 cm Tube secured with: Tape Dental Injury: Teeth and Oropharynx as per pre-operative assessment  Comments: Previous intubation was grade III with MAC 3 therefore electively chose to glidescope. Easy glidescope intubation.

## 2023-03-03 ENCOUNTER — Encounter (HOSPITAL_COMMUNITY): Payer: Self-pay | Admitting: Cardiovascular Disease

## 2023-03-03 ENCOUNTER — Inpatient Hospital Stay (HOSPITAL_BASED_OUTPATIENT_CLINIC_OR_DEPARTMENT_OTHER): Payer: Medicare Other

## 2023-03-03 DIAGNOSIS — Z9889 Other specified postprocedural states: Secondary | ICD-10-CM

## 2023-03-03 DIAGNOSIS — Z95818 Presence of other cardiac implants and grafts: Secondary | ICD-10-CM

## 2023-03-03 DIAGNOSIS — Z954 Presence of other heart-valve replacement: Secondary | ICD-10-CM | POA: Diagnosis not present

## 2023-03-03 LAB — BASIC METABOLIC PANEL
Anion gap: 7 (ref 5–15)
BUN: 20 mg/dL (ref 8–23)
CO2: 25 mmol/L (ref 22–32)
Calcium: 7.9 mg/dL — ABNORMAL LOW (ref 8.9–10.3)
Chloride: 105 mmol/L (ref 98–111)
Creatinine, Ser: 0.65 mg/dL (ref 0.44–1.00)
GFR, Estimated: >60 mL/min (ref >60)
Glucose, Bld: 133 mg/dL — ABNORMAL HIGH (ref 70–99)
Potassium: 4.3 mmol/L (ref 3.5–5.1)
Sodium: 137 mmol/L (ref 135–145)

## 2023-03-03 LAB — CBC
HCT: 39 % (ref 36.0–46.0)
Hemoglobin: 12.1 g/dL (ref 12.0–15.0)
MCH: 28.1 pg (ref 26.0–34.0)
MCHC: 31 g/dL (ref 30.0–36.0)
MCV: 90.7 fL (ref 80.0–100.0)
Platelets: 225 10*3/uL (ref 150–400)
RBC: 4.3 MIL/uL (ref 3.87–5.11)
RDW: 14.8 % (ref 11.5–15.5)
WBC: 9.5 10*3/uL (ref 4.0–10.5)
nRBC: 0 % (ref 0.0–0.2)

## 2023-03-03 LAB — ECHOCARDIOGRAM COMPLETE
Area-P 1/2: 1.89 cm2
Height: 63 in
MV VTI: 0.75 cm2
S' Lateral: 2.9 cm
Single Plane A4C EF: 48.2 %
Weight: 1940.05 oz

## 2023-03-03 LAB — POCT ACTIVATED CLOTTING TIME: Activated Clotting Time: 228 seconds

## 2023-03-03 MED ORDER — FUROSEMIDE 20 MG PO TABS: 20.0000 mg | ORAL_TABLET | Freq: Every day | ORAL | Status: DC | PRN

## 2023-03-03 MED ORDER — ASPIRIN 81 MG PO CHEW: 81.0000 mg | CHEWABLE_TABLET | Freq: Every day | ORAL | Status: DC

## 2023-03-03 NOTE — Progress Notes (Signed)
CARDIAC REHAB PHASE I   PRE:  Rate/Rhythm: 64 SR  BP:  Sitting: 138/68      SaO2: 97 RA  MODE:  Ambulation: 120 ft   POST:  Rate/Rhythm: 70 SR  BP:  Sitting: 136/89      SaO2: 94 RA  Pt ambulated independently in hall, moving at slow steady pace. Tolerated well with no CP, dizziness or SOB. Returned to bed with call bell and bedside table in reach. Post procedure education including site care, restrictions, risk factors, exercise guidelines, heart healthy diet and CRP2 reviewed. All questions and concerns addressed. Pt is not interested in CRP2 at this time. Brochure for [facility] CRP2 program provided.  Plan for home today.    [id number] Woodroe Chen, RN BSN 03/03/2023 10:44 AM

## 2023-03-03 NOTE — Progress Notes (Signed)
  Echocardiogram 2D Echocardiogram has been performed.  Erin [name] 03/03/2023, 9:54 AM

## 2023-03-03 NOTE — Progress Notes (Signed)
Went over discharge paper work with patient and answered any questions, removed ivs, and patient is being wheel chaired out and going home with spouse

## 2023-03-04 ENCOUNTER — Telehealth: Payer: Self-pay | Admitting: Cardiology

## 2023-03-04 NOTE — Telephone Encounter (Signed)
  [facility]   MULTIDISCIPLINARY HEART VALVE TEAM   Patient contacted regarding discharge from [facility] on 03/03/23. Patient reports that she is doing well however picked up a slight cold and has nasal drainage.    Patient understands to follow up with provider Georgie Chard on 03/09/23  Patient understands discharge instructions? Yes  Patient understands medications and regimen? Yes  Patient understands to bring all medications to this visit? Yes   Georgie Chard NP-C Structural Heart Team  Pager: [phone]

## 2023-03-08 NOTE — Progress Notes (Unsigned)
[facility]   MULTIDISCIPLINARY HEART VALVE CLINIC                                     Cardiology Office Note:    Date:  03/09/2023   ID:  Erin [name], DOB 1935/06/12, MRN [id number]  PCP:  Jeoffrey Massed, MD  [facility] Cardiologist:  Tonny Bollman, MD [facility] Electrophysiologist:  None   Referring MD: Jeoffrey Massed, MD   Chief Complaint  Patient presents with   Follow-up    TOC s/p TEER    History of Present Illness:    Erin [name] is a 88 y.o. female with a hx of HTN, OA, chronic diastolic CHF, severe pulmonary HTN, moderate TR and mitral valve prolapse with severe degenerative mitral regurgitation who underwent transcatheter edge-to-edge repair of the mitral valve in 10/2020 with and is now s/p repeat TEER 03/02/23 for residual severe MR. She is being seen today for TOC follow up.    The patient underwent transcatheter edge-to-edge repair of the mitral valve for treatment of prolapse and flail of the posterior leaflet in 10/2020. Initially, she had an acceptable result with mild to moderate residual mitral regurgitation. However, on follow-up imaging she was demonstrated to have severe mitral regurgitation confirmed by TEE assessment.  She was scheduled for redo mitral transcatheter edge-to-edge repair, but the patient canceled her procedure. She then returned to the office with progressive shortness of breath and orthopnea and decided to proceed with surgery.   Erin [name] is now s/p successful TEER with a single MitraClip NTW device, reducing mitral regurgitation from 4+ to 1+. Device placed A2/P2 (lateral to the previously implanted clip device). POD1 echo with prior XTW clip to A2/P2 and new clip NTW lateral to original with mild residual  MR lateral to both clips. MVA 1.4 cm2, mean gradient 7, and peak 15 mmHg with a HR 63  bpm. She was continued on ASA 81mg  QD.  Today she presents alone and reports that she has been doing well from a CV  standpoint however has been having issues with an upper respiratory infection. She initially felt her symptoms were a head cold however has developed more facial pain and darker mucus. She is planning to see her PCP for this. She states that she has been more fatigued after her procedure but feels this is due to her recent illness. No fevers, N/V/D. Denies chest pain, palpitations, LE edema, orthopnea, dizziness, bleeding in stool or urine, or syncope.   Past Medical History:  Diagnosis Date   Achilles tendon rupture    left, no surgery required   Chronic diastolic heart failure (HCC)    Gross hematuria 12/2020   Klebsiella on urine clx x2 but no UTI sx's.  CT showed tiny stones in each renal pelvis but no ureteral or bladder stones, no mass.  Urol did cysto->normal. Plan is annual UA, rpt hematuria w/u in 3-5 yrs if persistent pos.   History of kidney stones    Hypertension    Hypertensive retinopathy of both eyes    Dr. Elmer Picker   Low back pain    spondylosis, listhesis, +scoliosis at L/S jxn   Lower extremity edema    lasix prn   OA (osteoarthritis)    Osteopenia    Osteoporosis 07/2021   07/2021 T score -4.6   S/P mitral valve clip implantation 11/06/2020   s/p  TEER with one XTW MitraClip on A2P2 by Dr. Excell Seltzer.  DAPT w/ASA and plavix x 3 mo post procedure recommended.   Severe mitral regurgitation    Mitral valve clip 10/2020.  Clip #25 January 2023   Severe pulmonary hypertension (HCC) 2021   transth echo; moderate by TEE 08/2020   Systolic murmur 07/03/2020   severe mitral regurge, mod/severe tricuspic regurg   Tremor of right hand     Past Surgical History:  Procedure Laterality Date   APPENDECTOMY     88 yrs old   BACK SURGERY  2004   ruptured disc   CHOLECYSTECTOMY     age 54   CYSTOSCOPY  02/24/2021   (for gross hematuria) ->normal.   DEXA  07/2021   2022 T score -4.6 (radius)   LUMBAR LAMINECTOMY  2004   MITRAL VALVE REPAIR N/A 11/06/2020   Procedure: MITRAL VALVE  REPAIR;  Surgeon: Tonny Bollman, MD;  Location: [facility] INVASIVE CV LAB;  Service: Cardiovascular;  Laterality: N/A;   MITRAL VALVE REPAIR N/A 03/02/2023   Procedure: MITRAL VALVE REPAIR;  Surgeon: Tonny Bollman, MD;  Location: [facility] INVASIVE CV LAB;  Service: Cardiovascular;  Laterality: N/A;   RIGHT/LEFT HEART CATH AND CORONARY ANGIOGRAPHY N/A 09/16/2020   No CAD. Procedure: RIGHT/LEFT HEART CATH AND CORONARY ANGIOGRAPHY;  Surgeon: Lyn Records, MD;  Location: [facility] INVASIVE CV LAB;  Service: Cardiovascular;  Laterality: N/A;   TEE WITHOUT CARDIOVERSION N/A 09/16/2020   Procedure: TRANSESOPHAGEAL ECHOCARDIOGRAM (TEE);  Surgeon: Jodelle Red, MD;  Location: [facility] ENDOSCOPY;  Service: Cardiovascular;  Laterality: N/A;   TEE WITHOUT CARDIOVERSION N/A 11/06/2020   Procedure: TRANSESOPHAGEAL ECHOCARDIOGRAM (TEE);  Surgeon: Tonny Bollman, MD;  Location: [facility] INVASIVE CV LAB;  Service: Cardiovascular;  Laterality: N/A;   TEE WITHOUT CARDIOVERSION N/A 06/16/2022   Procedure: TRANSESOPHAGEAL ECHOCARDIOGRAM (TEE);  Surgeon: Thurmon Fair, MD;  Location: [facility] ENDOSCOPY;  Service: Cardiovascular;  Laterality: N/A;   TEE WITHOUT CARDIOVERSION N/A 03/02/2023   Procedure: TRANSESOPHAGEAL ECHOCARDIOGRAM;  Surgeon: Tonny Bollman, MD;  Location: [facility] INVASIVE CV LAB;  Service: Cardiovascular;  Laterality: N/A;   TONSILLECTOMY     age 72   TOTAL KNEE ARTHROPLASTY Left 06/02/2021   Procedure: LEFT TOTAL KNEE ARTHROPLASTY;  Surgeon: Sheral Apley, MD;  Location: [facility] ORS;  Service: Orthopedics;  Laterality: Left;   TRANSTHORACIC ECHOCARDIOGRAM  07/03/2020; 10/28/20; 12/03/20   06/2020 TTE EF 60-65%, grd II DD, severe pulm art HTN, severe mitral valve regurg, mod/sev tricuspic valve regurg. Conf on TEE 08/2020.  10/28/20 (s/p MV clip procedure) mild-mod MVR, EF 55-60%. 11/2020 EF 60%, mild/mod MR w/mean grad 4mm hg and mod to sev TR.   TRANSTHORACIC ECHOCARDIOGRAM     10/2022 EF 60-65%, nl LV fxn, grd II DD, severe MV  clip regurg, severe pulm HTN.  02/01/23 (post MV clip#2: EF 60-65%, nl RV and LV fxn, inc PA pressure, severe tricusp regurg, no signif MR.    Current Medications: Current Meds  Medication Sig   amoxicillin (AMOXIL) 500 MG tablet Take 4 tablets (2,000 mg total) by mouth as directed. Take 4 tablets (2,000 mg total) by mouth 1 hour prior to all dental visits.   Ascorbic Acid (VITAMIN C) 1000 MG tablet Take 1,000 mg by mouth daily.   aspirin 81 MG chewable tablet Chew 1 tablet (81 mg total) by mouth daily.   azelastine (ASTELIN) 0.1 % nasal spray Place 2 sprays into both nostrils 2 (two) times daily. Use in each nostril as directed   Calcium Carb-Cholecalciferol (CALCIUM  600 + D PO) Take 1 tablet by mouth daily.   carvedilol (COREG) 25 MG tablet Take 1 tablet (25 mg total) by mouth in the morning and at bedtime.   cyanocobalamin (VITAMIN B12) 1000 MCG tablet Take 1,000 mcg by mouth daily.   furosemide (LASIX) 20 MG tablet Take 1 tablet (20 mg total) by mouth daily as needed for edema.   ibandronate (BONIVA) 150 MG tablet Take 1 tablet (150 mg total) by mouth every 30 (thirty) days. Take in the morning with a full glass of water, on an empty stomach, and do not take anything else by mouth or lie down for the next 30 min.   lisinopril (ZESTRIL) 10 MG tablet Take 1 tablet by mouth once daily   Multiple Vitamin (MULTIVITAMIN WITH MINERALS) TABS tablet Take 1 tablet by mouth in the morning.   Multiple Vitamins-Minerals (PRESERVISION AREDS PO) Take 1 tablet by mouth in the morning and at bedtime.    nitroGLYCERIN (NITRODUR - DOSED IN MG/24 HR) 0.2 mg/hr patch 1/2 patch over L Achilles tendon bid (Patient taking differently: Place 0.2 mg onto the skin daily as needed (blood circulation). On left foot)   omeprazole (PRILOSEC) 20 MG capsule Take 20 mg by mouth daily.   potassium chloride (KLOR-CON) 10 MEQ tablet Take 10 mEq by mouth daily as needed (with furosemide (fluid retention)).   sodium chloride (MURO  128) 5 % ophthalmic ointment Place 1 Application into both eyes at bedtime as needed (dry eyes).   vitamin E 180 MG (400 UNITS) capsule Take 400 Units by mouth in the morning.     Allergies:   Advil [ibuprofen], Barbiturates, Demerol, Doxycycline, Hydrochlorothiazide, Irbesartan, Losartan potassium, Olmesartan medoxomil, and Telmisartan   Social History   Socioeconomic History   Marital status: Married    Spouse name: Not on file   Number of children: Not on file   Years of education: Not on file   Highest education level: Not on file  Occupational History   Not on file  Tobacco Use   Smoking status: Never   Smokeless tobacco: Never  Vaping Use   Vaping Use: Never used  Substance and Sexual Activity   Alcohol use: No   Drug use: Never   Sexual activity: Not Currently  Other Topics Concern   Not on file  Social History Narrative   Married, 6 children, about 15 GC.  8 GGC   Former Diplomatic Services operational officer at [employer].  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   No tob.   No alc.   Social Determinants of Health   Financial Resource Strain: Low Risk  (11/03/2022)   Overall Financial Resource Strain (CARDIA)    Difficulty of Paying Living Expenses: Not hard at all  Food Insecurity: No Food Insecurity (11/03/2022)   Hunger Vital Sign    Worried About Running Out of Food in the Last Year: Never true    Ran Out of Food in the Last Year: Never true  Transportation Needs: No Transportation Needs (11/03/2022)   PRAPARE - Administrator, Civil Service (Medical): No    Lack of Transportation (Non-Medical): No  Physical Activity: Insufficiently Active (11/03/2022)   Exercise Vital Sign    Days of Exercise per Week: 7 days    Minutes of Exercise per Session: 20 min  Stress: No Stress Concern Present (11/03/2022)   [employer] of Occupational Health - Occupational Stress Questionnaire    Feeling of  Stress : Not at all  Social Connections: Socially  Integrated (11/03/2022)   Social Connection and Isolation Panel [NHANES]    Frequency of Communication with Friends and Family: More than three times a week    Frequency of Social Gatherings with Friends and Family: More than three times a week    Attends Religious Services: More than 4 times per year    Active Member of [employer] or Organizations: Yes    Attends Engineer, structural: More than 4 times per year    Marital Status: Married     Family History: The patient's family history includes Cancer in her father; Early death in her father; Hypertension in her mother and another family member; Liver cancer in her father. There is no history of Colon cancer or Rectal cancer.  ROS:   Please see the history of present illness.    All other systems reviewed and are negative.  EKGs/Labs/Other Studies Reviewed:    The following studies were reviewed today:  03/02/23  TRANSESOPHAGEAL ECHOCARDIOGRAM  MITRAL VALVE REPAIR  Conclusion Successful transcatheter edge-to-edge repair of the mitral valve with a single MitraClip NTW device, reducing mitral regurgitation from 4+ to 1+, device placed A2/P2 (lateral to the previously implanted clip device)  Echocardiogram 03/03/23:   1. Left ventricular ejection fraction, by estimation, is 60 to 65%. The  left ventricle has normal function. The left ventricle has no regional  wall motion abnormalities. Left ventricular diastolic parameters are  indeterminate.   2. Right ventricular systolic function is normal. The right ventricular  size is normal. There is severely elevated pulmonary artery systolic  pressure.   3. Left atrial size was moderately dilated.   4. Post trans septal puncture left to right shunt noted . Agitated saline  contrast bubble study was positive with shunting observed within 3-6  cardiac cycles suggestive of interatrial shunt.   5. Right atrial size was severely dilated.   6. Prior XTW clip to A2/P2 new clip NTW lateral to  original Mild residual  MR lateral to both clips MVA 1.4 cm2 mean gradient 7 peak 15 mmHg at HR 63  bpm. The mitral valve has been repaired/replaced. No evidence of mitral  valve regurgitation. No evidence   of mitral stenosis. There is a Mitra-Clip present in the mitral position.  Procedure Date: 03/02/23.   7. Tricuspid valve regurgitation is severe.   8. The aortic valve is tricuspid. There is mild calcification of the  aortic valve. There is mild thickening of the aortic valve. Aortic valve  regurgitation is mild. Aortic valve sclerosis is present, with no evidence  of aortic valve stenosis.   9. The inferior vena cava is dilated in size with <50% respiratory  variability, suggesting right atrial pressure of 15 mmHg.    EKG:  EKG is not ordered today.   Recent Labs: 02/28/2023: ALT 11; B Natriuretic Peptide 659.1 03/03/2023: BUN 20; Creatinine, Ser 0.65; Hemoglobin 12.1; Platelets 225; Potassium 4.3; Sodium 137   Recent Lipid Panel    Component Value Date/Time   CHOL 139 07/02/2020 1057   TRIG 30.0 07/02/2020 1057   TRIG 33 09/21/2006 1115   HDL 80.30 07/02/2020 1057   CHOLHDL 2 07/02/2020 1057   VLDL 6.0 07/02/2020 1057   LDLCALC 53 07/02/2020 1057   Physical Exam:    VS:  BP 138/62   Pulse 64   Ht 5\' 3"  (1.6 m)   Wt 119 lb 12.8 oz (54.3 kg)   SpO2 97%  BMI 21.22 kg/m     Wt Readings from Last 3 Encounters:  03/09/23 119 lb 12.8 oz (54.3 kg)  03/02/23 121 lb 4.1 oz (55 kg)  02/07/23 119 lb 3.2 oz (54.1 kg)    General: Well developed, well nourished, NAD Lungs:Clear to ausculation bilaterally. No wheezes, rales, or rhonchi. Breathing is unlabored. Cardiovascular: RRR with S1 S2. No murmurs Extremities: No edema.  Neuro: Alert and oriented. No focal deficits. No facial asymmetry. MAE spontaneously. Psych: Responds to questions appropriately with normal affect.    ASSESSMENT/PLAN:    Severe MR: Patient doing well with NYHA class I symptoms s/p successful  transcatheter edge-to-edge repair of the mitral valve with a single MitraClip NTW device, reducing mitral regurgitation from 4+ to 1+, device placed A2/P2 (lateral to the previously implanted clip device). POD1 echo with prior XTW clip to A2/P2 and new clip NTW lateral to original with mild residual  MR lateral to both clips. MVA 1.4 cm2, mean gradient 7, and peak 15 mmHg with a HR 63  bpm. Continue ASA 81mg  QD. She will require lifelong dental SBE. She has Amoxicillin. Plan 1 month follow up with repeat echo at that time.    Chronic diastolic CHF: Appears euvolemic today. Takes PRN Lasix however has not needed this recently.    HTN: Stable with no changes needed today.    Pulmonary HTN: Continue to follow on surveillance imaging.    Medication Adjustments/Labs and Tests Ordered: Current medicines are reviewed at length with the patient today.  Concerns regarding medicines are outlined above.  No orders of the defined types were placed in this encounter.  No orders of the defined types were placed in this encounter.   Patient Instructions  Medication Instructions:  Your physician recommends that you continue on your current medications as directed. Please refer to the Current Medication list given to you today.  *If you need a refill on your cardiac medications before your next appointment, please call your pharmacy*   Lab Work: NONE If you have labs (blood work) drawn today and your tests are completely normal, you will receive your results only by: MyChart Message (if you have MyChart) OR A paper copy in the mail If you have any lab test that is abnormal or we need to change your treatment, we will call you to review the results.   Testing/Procedures: NONE   Follow-Up: At [facility], you and your health needs are our priority.  As part of our continuing mission to provide you with exceptional heart care, we have created designated Provider Care Teams.  These Care  Teams include your primary Cardiologist (physician) and Advanced Practice Providers (APPs -  Physician Assistants and Nurse Practitioners) who all work together to provide you with the care you need, when you need it.  We recommend signing up for the patient portal called "MyChart".  Sign up information is provided on this After Visit Summary.  MyChart is used to connect with patients for Virtual Visits (Telemedicine).  Patients are able to view lab/test results, encounter notes, upcoming appointments, etc.  Non-urgent messages can be sent to your provider as well.   To learn more about what you can do with MyChart, go to [email or web address].    Your next appointment:   KEEP SCHEDULED FOLLOW-UP   Signed, Georgie Chard, NP  03/09/2023 12:13 PM    [facility]

## 2023-03-09 ENCOUNTER — Ambulatory Visit (INDEPENDENT_AMBULATORY_CARE_PROVIDER_SITE_OTHER): Payer: Medicare Other | Admitting: Cardiology

## 2023-03-09 VITALS — BP 138/62 | HR 64 | Ht 63.0 in | Wt 119.8 lb

## 2023-03-09 DIAGNOSIS — I5032 Chronic diastolic (congestive) heart failure: Secondary | ICD-10-CM

## 2023-03-09 DIAGNOSIS — Z9889 Other specified postprocedural states: Secondary | ICD-10-CM

## 2023-03-09 DIAGNOSIS — I10 Essential (primary) hypertension: Secondary | ICD-10-CM | POA: Diagnosis not present

## 2023-03-09 DIAGNOSIS — I340 Nonrheumatic mitral (valve) insufficiency: Secondary | ICD-10-CM | POA: Diagnosis not present

## 2023-03-09 DIAGNOSIS — I272 Pulmonary hypertension, unspecified: Secondary | ICD-10-CM | POA: Diagnosis not present

## 2023-03-09 DIAGNOSIS — Z95818 Presence of other cardiac implants and grafts: Secondary | ICD-10-CM

## 2023-03-09 NOTE — Patient Instructions (Signed)
Medication Instructions:  Your physician recommends that you continue on your current medications as directed. Please refer to the Current Medication list given to you today.  *If you need a refill on your cardiac medications before your next appointment, please call your pharmacy*   Lab Work: NONE If you have labs (blood work) drawn today and your tests are completely normal, you will receive your results only by: MyChart Message (if you have MyChart) OR A paper copy in the mail If you have any lab test that is abnormal or we need to change your treatment, we will call you to review the results.   Testing/Procedures: NONE   Follow-Up: At [facility], you and your health needs are our priority.  As part of our continuing mission to provide you with exceptional heart care, we have created designated Provider Care Teams.  These Care Teams include your primary Cardiologist (physician) and Advanced Practice Providers (APPs -  Physician Assistants and Nurse Practitioners) who all work together to provide you with the care you need, when you need it.  We recommend signing up for the patient portal called "MyChart".  Sign up information is provided on this After Visit Summary.  MyChart is used to connect with patients for Virtual Visits (Telemedicine).  Patients are able to view lab/test results, encounter notes, upcoming appointments, etc.  Non-urgent messages can be sent to your provider as well.   To learn more about what you can do with MyChart, go to [email or web address].    Your next appointment:   KEEP SCHEDULED FOLLOW-UP 

## 2023-04-13 ENCOUNTER — Ambulatory Visit (HOSPITAL_COMMUNITY): Payer: Medicare Other

## 2023-04-13 ENCOUNTER — Ambulatory Visit: Payer: Medicare Other

## 2023-04-20 ENCOUNTER — Ambulatory Visit: Payer: Medicare Other | Attending: Internal Medicine

## 2023-04-20 ENCOUNTER — Ambulatory Visit (INDEPENDENT_AMBULATORY_CARE_PROVIDER_SITE_OTHER): Payer: Medicare Other | Admitting: Physician Assistant

## 2023-04-20 VITALS — BP 196/98 | HR 163 | Ht 63.0 in | Wt 115.8 lb

## 2023-04-20 DIAGNOSIS — Z9889 Other specified postprocedural states: Secondary | ICD-10-CM | POA: Diagnosis not present

## 2023-04-20 DIAGNOSIS — Z95818 Presence of other cardiac implants and grafts: Secondary | ICD-10-CM | POA: Insufficient documentation

## 2023-04-20 DIAGNOSIS — I272 Pulmonary hypertension, unspecified: Secondary | ICD-10-CM

## 2023-04-20 DIAGNOSIS — I10 Essential (primary) hypertension: Secondary | ICD-10-CM | POA: Diagnosis not present

## 2023-04-20 DIAGNOSIS — I340 Nonrheumatic mitral (valve) insufficiency: Secondary | ICD-10-CM | POA: Diagnosis not present

## 2023-04-20 DIAGNOSIS — I071 Rheumatic tricuspid insufficiency: Secondary | ICD-10-CM

## 2023-04-20 DIAGNOSIS — I5032 Chronic diastolic (congestive) heart failure: Secondary | ICD-10-CM | POA: Diagnosis not present

## 2023-04-20 LAB — ECHOCARDIOGRAM COMPLETE
Area-P 1/2: 1.2 cm2
MV M vel: 3.83 m/s
MV Peak grad: 58.7 mmHg
MV VTI: 0.69 cm2
S' Lateral: 3 cm

## 2023-04-20 MED ORDER — HYDRALAZINE HCL 25 MG PO TABS: 25.0000 mg | ORAL_TABLET | ORAL | 3 refills | Status: DC | PRN

## 2023-04-20 MED ORDER — LISINOPRIL 20 MG PO TABS: 20.0000 mg | ORAL_TABLET | Freq: Every day | ORAL | 3 refills | Status: DC

## 2023-04-20 NOTE — Progress Notes (Signed)
[facility]   MULTIDISCIPLINARY HEART VALVE CLINIC                                     Cardiology Office Note:    Date:  04/21/2023   ID:  Erin [name], DOB [birth year], MRN [id number]  PCP:  Erin Massed, MD  [facility] Cardiologist:  Erin Bollman, MD  [facility] Electrophysiologist:  None   Referring MD: Erin Massed, MD   1 month s/p mTEER  History of Present Illness:    Erin [name] is a 88 y.o. female with a hx of HTN, OA, chronic diastolic CHF, severe pulmonary HTN, moderate TR and severe MR s/p mTEER (10/2020, 02/2023) who presents to clinic for follow up.   The patient underwent transcatheter edge-to-edge repair of the mitral valve for treatment of prolapse and flail of the posterior leaflet in 10/2020. Initially, she had an acceptable result with mild to moderate residual mitral regurgitation. However, on follow-up imaging she was demonstrated to have severe mitral regurgitation confirmed by TEE assessment.  She was scheduled for redo mitral transcatheter edge-to-edge repair, but the patient canceled her procedure. She then returned to the office with progressive shortness of breath and orthopnea and decided to proceed with surgery.    Ms. Erin [name] is now s/p successful TEER with a single MitraClip NTW device, reducing mitral regurgitation from 4+ to 1+. Device placed A2/P2 (lateral to the previously implanted clip device). POD1 echo with prior XTW clip to A2/P2 and new clip NTW lateral to original with mild residual MR lateral to both clips. MVA 1.4 cm2, mean gradient 7, and peak 15 mmHg with a HR 63  bpm. She was continued on ASA 81mg  QD.  Today the patient presents to clinic for follow up. Here alone. No CP or SOB. No LE edema, orthopnea or PND. No dizziness or syncope. No blood in stool or urine. No palpitations. Stays very busy. Mostly limited by back and knee problems.    Past Medical History:  Diagnosis Date   Achilles tendon rupture     left, no surgery required   Chronic diastolic heart failure (HCC)    Gross hematuria 12/2020   Klebsiella on urine clx x2 but no UTI sx's.  CT showed tiny stones in each renal pelvis but no ureteral or bladder stones, no mass.  Urol did cysto->normal. Plan is annual UA, rpt hematuria w/u in 3-5 yrs if persistent pos.   History of kidney stones    Hypertension    Hypertensive retinopathy of both eyes    Dr. Elmer [name]   Low back pain    spondylosis, listhesis, +scoliosis at L/S jxn   Lower extremity edema    lasix prn   OA (osteoarthritis)    Osteopenia    Osteoporosis 07/2021   07/2021 T score -4.6   S/P mitral valve clip implantation 11/06/2020   s/p TEER with one XTW MitraClip on A2P2 by Dr. Excell [name].  DAPT w/ASA and plavix x 3 mo post procedure recommended.   Severe mitral regurgitation    Mitral valve clip 10/2020.  Clip #25 January 2023   Severe pulmonary hypertension (HCC) 2021   transth echo; moderate by TEE 08/2020   Systolic murmur 07/03/2020   severe mitral regurge, mod/severe tricuspic regurg   Tremor of right hand     Past Surgical History:  Procedure Laterality Date   APPENDECTOMY  88 yrs old   BACK SURGERY  2004   ruptured disc   CHOLECYSTECTOMY     age 61   CYSTOSCOPY  02/24/2021   (for gross hematuria) ->normal.   DEXA  07/2021   2022 T score -4.6 (radius)   LUMBAR LAMINECTOMY  2004   MITRAL VALVE REPAIR N/A 11/06/2020   Procedure: MITRAL VALVE REPAIR;  Surgeon: Erin Bollman, MD;  Location: [facility] INVASIVE CV LAB;  Service: Cardiovascular;  Laterality: N/A;   MITRAL VALVE REPAIR N/A 03/02/2023   Procedure: MITRAL VALVE REPAIR;  Surgeon: Erin Bollman, MD;  Location: [facility] INVASIVE CV LAB;  Service: Cardiovascular;  Laterality: N/A;   RIGHT/LEFT HEART CATH AND CORONARY ANGIOGRAPHY N/A 09/16/2020   No CAD. Procedure: RIGHT/LEFT HEART CATH AND CORONARY ANGIOGRAPHY;  Surgeon: Erin Records, MD;  Location: [facility] INVASIVE CV LAB;  Service: Cardiovascular;  Laterality: N/A;    TEE WITHOUT CARDIOVERSION N/A 09/16/2020   Procedure: TRANSESOPHAGEAL ECHOCARDIOGRAM (TEE);  Surgeon: Erin Red, MD;  Location: [facility] ENDOSCOPY;  Service: Cardiovascular;  Laterality: N/A;   TEE WITHOUT CARDIOVERSION N/A 11/06/2020   Procedure: TRANSESOPHAGEAL ECHOCARDIOGRAM (TEE);  Surgeon: Erin Bollman, MD;  Location: [facility] INVASIVE CV LAB;  Service: Cardiovascular;  Laterality: N/A;   TEE WITHOUT CARDIOVERSION N/A 06/16/2022   Procedure: TRANSESOPHAGEAL ECHOCARDIOGRAM (TEE);  Surgeon: Erin Fair, MD;  Location: [facility] ENDOSCOPY;  Service: Cardiovascular;  Laterality: N/A;   TEE WITHOUT CARDIOVERSION N/A 03/02/2023   Procedure: TRANSESOPHAGEAL ECHOCARDIOGRAM;  Surgeon: Erin Bollman, MD;  Location: [facility] INVASIVE CV LAB;  Service: Cardiovascular;  Laterality: N/A;   TONSILLECTOMY     age 68   TOTAL KNEE ARTHROPLASTY Left 06/02/2021   Procedure: LEFT TOTAL KNEE ARTHROPLASTY;  Surgeon: Erin Apley, MD;  Location: [facility] ORS;  Service: Orthopedics;  Laterality: Left;   TRANSTHORACIC ECHOCARDIOGRAM  07/03/2020; 10/28/20; 12/03/20   06/2020 TTE EF 60-65%, grd II DD, severe pulm art HTN, severe mitral valve regurg, mod/sev tricuspic valve regurg. Conf on TEE 08/2020.  10/28/20 (s/p MV clip procedure) mild-mod MVR, EF 55-60%. 11/2020 EF 60%, mild/mod MR w/mean grad 4mm hg and mod to sev TR.   TRANSTHORACIC ECHOCARDIOGRAM     10/2022 EF 60-65%, nl LV fxn, grd II DD, severe MV clip regurg, severe pulm HTN.  02/01/23 (post MV clip#2: EF 60-65%, nl RV and LV fxn, inc PA pressure, severe tricusp regurg, no signif MR.    Current Medications: Current Meds  Medication Sig   amoxicillin (AMOXIL) 500 MG tablet Take 4 tablets (2,000 mg total) by mouth as directed. Take 4 tablets (2,000 mg total) by mouth 1 hour prior to all dental visits.   Ascorbic Acid (VITAMIN C) 1000 MG tablet Take 1,000 mg by mouth daily.   azelastine (ASTELIN) 0.1 % nasal spray Place 2 sprays into both nostrils 2 (two) times daily.  (Patient taking differently: Place 2 sprays into both nostrils daily as needed for allergies.)   Calcium Carb-Cholecalciferol (CALCIUM 600 + D PO) Take 1 tablet by mouth daily.   carvedilol (COREG) 25 MG tablet Take 1 tablet (25 mg total) by mouth in the morning and at bedtime.   cyanocobalamin (VITAMIN B12) 1000 MCG tablet Take 1,000 mcg by mouth daily.   furosemide (LASIX) 20 MG tablet Take 1 tablet (20 mg total) by mouth daily as needed for edema.   hydrALAZINE (APRESOLINE) 25 MG tablet Take 1 tablet (25 mg total) by mouth as needed (IF BLOOD PRESSURE IS 180/90 OR GREATER). IF BLOOD PRESSURE IS 180/90 OR GREATER  ibandronate (BONIVA) 150 MG tablet Take 1 tablet (150 mg total) by mouth every 30 (thirty) days. Take in the morning with a full glass of water, on an empty stomach, and do not take anything else by mouth or lie down for the next 30 min.   lisinopril (ZESTRIL) 20 MG tablet Take 1 tablet (20 mg total) by mouth daily.   Multiple Vitamin (MULTIVITAMIN WITH MINERALS) TABS tablet Take 1 tablet by mouth in the morning.   Multiple Vitamins-Minerals (PRESERVISION AREDS PO) Take 1 tablet by mouth in the morning and at bedtime.    nitroGLYCERIN (NITRODUR - DOSED IN MG/24 HR) 0.2 mg/hr patch 1/2 patch over L Achilles tendon bid (Patient taking differently: Place 0.2 mg onto the skin daily as needed (blood circulation). On left foot)   omeprazole (PRILOSEC) 20 MG capsule Take 20 mg by mouth daily.   potassium chloride (KLOR-CON) 10 MEQ tablet Take 10 mEq by mouth daily as needed (with furosemide (fluid retention)).   sodium chloride (MURO 128) 5 % ophthalmic ointment Place 1 Application into both eyes at bedtime as needed (dry eyes).   vitamin E 180 MG (400 UNITS) capsule Take 400 Units by mouth in the morning.   [DISCONTINUED] azelastine (ASTELIN) 0.1 % nasal spray Place 2 sprays into both nostrils 2 (two) times daily. Use in each nostril as directed   [DISCONTINUED] lisinopril (ZESTRIL) 10 MG  tablet Take 1 tablet by mouth once daily     Allergies:   Advil [ibuprofen], Barbiturates, Demerol, Doxycycline, Hydrochlorothiazide, Irbesartan, Losartan potassium, Olmesartan medoxomil, and Telmisartan   Social History   Socioeconomic History   Marital status: Married    Spouse name: Not on file   Number of children: Not on file   Years of education: Not on file   Highest education level: Not on file  Occupational History   Not on file  Tobacco Use   Smoking status: Never   Smokeless tobacco: Never  Vaping Use   Vaping Use: Never used  Substance and Sexual Activity   Alcohol use: No   Drug use: Never   Sexual activity: Not Currently  Other Topics Concern   Not on file  Social History Narrative   Married, 6 children, about 15 GC.  8 GGC   Former Diplomatic Services operational officer at [employer].  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   No tob.   No alc.   Social Determinants of Health   Financial Resource Strain: Low Risk  (11/03/2022)   Overall Financial Resource Strain (CARDIA)    Difficulty of Paying Living Expenses: Not hard at all  Food Insecurity: No Food Insecurity (11/03/2022)   Hunger Vital Sign    Worried About Running Out of Food in the Last Year: Never true    Ran Out of Food in the Last Year: Never true  Transportation Needs: No Transportation Needs (11/03/2022)   PRAPARE - Administrator, Civil Service (Medical): No    Lack of Transportation (Non-Medical): No  Physical Activity: Insufficiently Active (11/03/2022)   Exercise Vital Sign    Days of Exercise per Week: 7 days    Minutes of Exercise per Session: 20 min  Stress: No Stress Concern Present (11/03/2022)   [employer] of Occupational Health - Occupational Stress Questionnaire    Feeling of Stress : Not at all  Social Connections: Socially Integrated (11/03/2022)   Social Connection and Isolation Panel [NHANES]    Frequency of Communication  with Friends and Family: More than three  times a week    Frequency of Social Gatherings with Friends and Family: More than three times a week    Attends Religious Services: More than 4 times per year    Active Member of [employer] or Organizations: Yes    Attends Engineer, structural: More than 4 times per year    Marital Status: Married     Family History: The patient's family history includes Cancer in her father; Early death in her father; Hypertension in her mother and another family member; Liver cancer in her father. There is no history of Colon cancer or Rectal cancer.  ROS:   Please see the history of present illness.    All other systems reviewed and are negative.  EKGs/Labs/Other Studies Reviewed:    Cardiac Studies & Procedures   CARDIAC CATHETERIZATION  CARDIAC CATHETERIZATION 03/02/2023  Narrative Successful transcatheter edge-to-edge repair of the mitral valve with a single MitraClip NTW device, reducing mitral regurgitation from 4+ to 1+, device placed A2/P2 (lateral to the previously implanted clip device)  See full Op Note for details   CARDIAC CATHETERIZATION  CARDIAC CATHETERIZATION 11/06/2020  Narrative Successful transcatheter edge-to-edge repair of the mitral valve with a MitraClip XTW device, placed A2/P2, reducing MR from 4+ at baseline to 1-2+ post-procedure     ECHOCARDIOGRAM  ECHOCARDIOGRAM COMPLETE 04/20/2023  Narrative ECHOCARDIOGRAM REPORT    Patient Name:   Erin [name] [facility] Date of Exam: 04/20/2023 Medical Rec #:  [id number]       Height:       63.0 in Accession #:    [id number]      Weight:       119.8 lb Date of Birth:  1935/01/24        BSA:          1.555 m Patient Age:    88 years        BP:           138/62 mmHg Patient Gender: F               HR:           70 bpm. Exam Location:  [address]  Procedure: 2D Echo, Cardiac Doppler and Color Doppler  Indications:    Z98.890, Z95.818 S/P mitral valve clip implantation  History:        Patient has prior history of  Echocardiogram examinations, most recent 03/03/2023. CHF, Stroke, Signs/Symptoms:Fatigue; Risk Factors:Hypertension. Pulmonary hypertension. Severe mitral regurgitation.  Mitral Valve: Mitra-Clip valve is present in the mitral position. Procedure Date: 03/02/23.  Sonographer:    Cathie Beams RCS Referring Phys: [phone] JILL D MCDANIEL  IMPRESSIONS   1. Left ventricular ejection fraction, by estimation, is 55 to 60%. The left ventricle has normal function. The left ventricle has no regional wall motion abnormalities. Left ventricular diastolic function could not be evaluated. 2. Right ventricular systolic function is normal. The right ventricular size is normal. There is severely elevated pulmonary artery systolic pressure. The estimated right ventricular systolic pressure is 75.2 mmHg. 3. Left atrial size was severely dilated. 4. Right atrial size was severely dilated. 5. The mitral valve has been repaired/replaced. Mild mitral valve regurgitation. Moderate mitral stenosis. The mean mitral valve gradient is 7.0 mmHg. There is a Mitra-Clip present in the mitral position. Procedure Date: 03/02/23. 6. Tricuspid valve regurgitation is severe. 7. The aortic valve is tricuspid. There is mild calcification of the aortic valve. There is mild thickening of  the aortic valve. Aortic valve regurgitation is trivial. Aortic valve sclerosis is present, with no evidence of aortic valve stenosis. 8. Aortic dilatation noted. 9. The inferior vena cava is dilated in size with <50% respiratory variability, suggesting right atrial pressure of 15 mmHg. 10. Evidence of atrial level shunting detected by color flow Doppler. There is a small atrial septal defect with predominantly left to right shunting across the atrial septum.  Comparison(s): No significant change from prior study.  FINDINGS Left Ventricle: Left ventricular ejection fraction, by estimation, is 55 to 60%. The left ventricle has normal function. The left  ventricle has no regional wall motion abnormalities. The left ventricular internal cavity size was normal in size. There is no left ventricular hypertrophy. Left ventricular diastolic function could not be evaluated due to mitral valve repair. Left ventricular diastolic function could not be evaluated.  Right Ventricle: The right ventricular size is normal. Right vetricular wall thickness was not well visualized. Right ventricular systolic function is normal. There is severely elevated pulmonary artery systolic pressure. The tricuspid regurgitant velocity is 3.88 m/s, and with an assumed right atrial pressure of 15 mmHg, the estimated right ventricular systolic pressure is 75.2 mmHg.  Left Atrium: Left atrial size was severely dilated.  Right Atrium: Right atrial size was severely dilated.  Pericardium: There is no evidence of pericardial effusion.  Mitral Valve: The mitral valve has been repaired/replaced. Mild mitral valve regurgitation. There is a Mitra-Clip present in the mitral position. Procedure Date: 03/02/23. Moderate mitral valve stenosis. MV peak gradient, 13.4 mmHg. The mean mitral valve gradient is 7.0 mmHg.  Tricuspid Valve: The tricuspid valve is normal in structure. Tricuspid valve regurgitation is severe. No evidence of tricuspid stenosis.  Aortic Valve: The aortic valve is tricuspid. There is mild calcification of the aortic valve. There is mild thickening of the aortic valve. Aortic valve regurgitation is trivial. Aortic valve sclerosis is present, with no evidence of aortic valve stenosis.  Pulmonic Valve: The pulmonic valve was grossly normal. Pulmonic valve regurgitation is trivial. No evidence of pulmonic stenosis.  Aorta: Aortic dilatation noted.  Venous: The inferior vena cava is dilated in size with less than 50% respiratory variability, suggesting right atrial pressure of 15 mmHg.  IAS/Shunts: Evidence of atrial level shunting detected by color flow Doppler. There  is a small atrial septal defect with predominantly left to right shunting across the atrial septum.   LEFT VENTRICLE PLAX 2D LVIDd:         4.20 cm LVIDs:         3.00 cm LV PW:         0.80 cm LV IVS:        0.80 cm LVOT diam:     1.90 cm LV SV:         44 LV SV Index:   28 LVOT Area:     2.84 cm   RIGHT VENTRICLE RV Basal diam:  3.90 cm RV S prime:     9.14 cm/s TAPSE (M-mode): 2.5 cm RVSP:           75.2 mmHg  LEFT ATRIUM              Index        RIGHT ATRIUM            Index LA diam:        3.80 cm  2.44 cm/m   RA Pressure: 15.00 mmHg LA Vol (A2C):   74.8 ml  48.10 ml/m  RA Area:     26.50 cm LA Vol (A4C):   103.0 ml 66.23 ml/m  RA Volume:   90.00 ml   57.87 ml/m LA Biplane Vol: 94.0 ml  60.44 ml/m AORTIC VALVE             PULMONIC VALVE LVOT Vmax:   69.40 cm/s  PR End Diast Vel: 9.24 msec LVOT Vmean:  47.700 cm/s LVOT VTI:    0.155 m  AORTA Ao Root diam: 2.50 cm Ao Asc diam:  3.40 cm  MITRAL VALVE                TRICUSPID VALVE MV Area (PHT): 1.20 cm     TR Peak grad:   60.2 mmHg MV Area VTI:   0.69 cm     TR Vmax:        388.00 cm/s MV Peak grad:  13.4 mmHg    Estimated RAP:  15.00 mmHg MV Mean grad:  7.0 mmHg     RVSP:           75.2 mmHg MV Vmax:       1.83 m/s MV Vmean:      131.0 cm/s   SHUNTS MV Decel Time: 630 msec     Systemic VTI:  0.16 m MR Peak grad: 58.7 mmHg     Systemic Diam: 1.90 cm MR Mean grad: 34.0 mmHg MR Vmax:      383.00 cm/s MR Vmean:     275.0 cm/s MV E velocity: 148.00 cm/s MV A velocity: 146.00 cm/s MV E/A ratio:  1.01  Erin Red MD Electronically signed by Erin Red MD Signature Date/Time: 04/20/2023/9:43:02 PM    Final   TEE  ECHO TEE 03/02/2023  Narrative TRANSESOPHOGEAL ECHO REPORT    Patient Name:   Erin [name] [facility] Date of Exam: 03/02/2023 Medical Rec #:  [id number]       Height:       63.0 in Accession #:    [id number]      Weight:       115.0 lb Date of Birth:  02-06-1935        BSA:           1.528 m Patient Age:    88 years        BP:           140/57 mmHg Patient Gender: F               HR:           58 bpm. Exam Location:  Inpatient  Procedure: Transesophageal Echo, 3D Echo, Color Doppler and Cardiac Doppler  Indications:     Mitral Valve Regurgitation i34.1  History:         Patient has prior history of Echocardiogram examinations, most recent 11/08/2022. Risk Factors:Hypertension. Mitraclip XTW placed A2P2 11/06/20.  Sonographer:     Irving Burton Senior RDCS Referring Phys:  [phone] MICHAEL COOPER Diagnosing Phys: Charlton Haws MD   Sonographer Comments: Mitraclip Procedure   PROCEDURE: After discussion of the risks and benefits of a TEE, an informed consent was obtained from the patient. The transesophogeal probe was passed without difficulty through the esophogus of the patient. Sedation performed by different physician. The patient was monitored while under deep sedation. The patient developed no complications during the procedure.  IMPRESSIONS   1. Left ventricular ejection fraction, by estimation, is 55 to 60%. The left ventricle has normal function. 2. Right ventricular systolic function is mildly reduced. The  right ventricular size is mildly enlarged. 3. Left atrial size was severely dilated. No left atrial/left atrial appendage thrombus was detected. 4. Right atrial size was moderately dilated. 5. Prior XTW clip to A2/P2 intact. Residual posterior P2 leaflet flail/prolapse with severe anteriorly directed MR. Extensive 3D imaging done to guid placement of of a 2 nd NTW clip lateral to original one. MR reduced from plus 4 to plus 1. Mean diastolic graient 5 peak 11 mmHg at HR of 47 bpm. The mitral valve is myxomatous. Severe mitral valve regurgitation. 6. Tricuspid valve regurgitation is severe. 7. The aortic valve is tricuspid. There is mild calcification of the aortic valve. There is mild thickening of the aortic valve. Aortic valve regurgitation is trivial.  Aortic valve sclerosis is present, with no evidence of aortic valve stenosis. 8. Evidence of atrial level shunting detected by color flow Doppler. Post trans septal only left to right shunting.  FINDINGS Left Ventricle: Left ventricular ejection fraction, by estimation, is 55 to 60%. The left ventricle has normal function. The left ventricular internal cavity size was normal in size.  Right Ventricle: The right ventricular size is mildly enlarged. Right vetricular wall thickness was not assessed. Right ventricular systolic function is mildly reduced.  Left Atrium: Left atrial size was severely dilated. No left atrial/left atrial appendage thrombus was detected.  Right Atrium: Right atrial size was moderately dilated.  Pericardium: There is no evidence of pericardial effusion.  Mitral Valve: Prior XTW clip to A2/P2 intact. Residual posterior P2 leaflet flail/prolapse with severe anteriorly directed MR. Extensive 3D imaging done to guid placement of of a 2 nd NTW clip lateral to original one. MR reduced from plus 4 to plus 1. Mean diastolic graient 5 peak 11 mmHg at HR of 47 bpm. The mitral valve is myxomatous. Severe mitral valve regurgitation.  Tricuspid Valve: The tricuspid valve is normal in structure. Tricuspid valve regurgitation is severe.  Aortic Valve: The aortic valve is tricuspid. There is mild calcification of the aortic valve. There is mild thickening of the aortic valve. Aortic valve regurgitation is trivial. Aortic valve sclerosis is present, with no evidence of aortic valve stenosis.  Pulmonic Valve: The pulmonic valve was normal in structure. Pulmonic valve regurgitation is mild.  Aorta: The aortic root is normal in size and structure.  IAS/Shunts: Evidence of atrial level shunting detected by color flow Doppler. Post trans septal only left to right shunting.  Additional Comments: Spectral Doppler performed.  MR Peak grad:    88.4 mmHg MR Mean grad:    43.0 mmHg MR  Vmax:         470.00 cm/s MR Vmean:        285.0 cm/s MR PISA:         6.28 cm MR PISA Eff ROA: 47 mm MR PISA Radius:  1.00 cm  Charlton Haws MD Electronically signed by Charlton Haws MD Signature Date/Time: 03/02/2023/2:56:24 PM    Final   MONITORS  LONG TERM MONITOR (3-14 DAYS) 07/06/2022  Narrative Patch Wear Time:  3 days and 3 hours (2023-08-27T08:24:03-399 to 2023-08-30T11:24:29-0400)  Patient had a min HR of 43 bpm, max HR of 132 bpm, and avg HR of 60 bpm. Predominant underlying rhythm was Sinus Rhythm. Inverted QRS complexes possibly due to inverted placement of device. 6 Supraventricular Tachycardia runs occurred, the run with the fastest interval lasting 6 beats with a max rate of 132 bpm, the longest lasting 12 beats with an avg rate of 112 bpm. Isolated SVEs were rare (<  1.0%), SVE Couplets were rare (<1.0%), and SVE Triplets were rare (<1.0%). Isolated VEs were rare (<1.0%), and no VE Couplets or VE Triplets were present.  SUMMARY: The basic rhythm is normal sinus with an average heart rate of 60 bpm.  There is no atrial fibrillation or flutter.  There were rare supraventricular runs, the longest lasting 12 beats.  There is no high-grade heart block.  There are no sustained arrhythmias.           EKG:  EKG is ordered today.  The ekg ordered today demonstrates sinus with baseline artifact, HR 62  Recent Labs: 02/28/2023: ALT 11; B Natriuretic Peptide 659.1 03/03/2023: BUN 20; Creatinine, Ser 0.65; Hemoglobin 12.1; Platelets 225; Potassium 4.3; Sodium 137  Recent Lipid Panel    Component Value Date/Time   CHOL 139 07/02/2020 1057   TRIG 30.0 07/02/2020 1057   TRIG 33 09/21/2006 1115   HDL 80.30 07/02/2020 1057   CHOLHDL 2 07/02/2020 1057   VLDL 6.0 07/02/2020 1057   LDLCALC 53 07/02/2020 1057     Risk Assessment/Calculations:       Physical Exam:    VS:  BP (!) 196/98   Pulse (!) 163   Ht 5\' 3"  (1.6 m)   Wt 115 lb 12.8 oz (52.5 kg)   SpO2 96%   BMI 20.51  kg/m     Wt Readings from Last 3 Encounters:  04/20/23 115 lb 12.8 oz (52.5 kg)  03/09/23 119 lb 12.8 oz (54.3 kg)  03/02/23 121 lb 4.1 oz (55 kg)     GEN:  Well nourished, well developed in no acute distress HEENT: Normal NECK: No JVD LYMPHATICS: No lymphadenopathy CARDIAC: RRR, no murmurs, rubs, gallops RESPIRATORY:  Clear to auscultation without rales, wheezing or rhonchi  ABDOMEN: Soft, non-tender, non-distended MUSCULOSKELETAL:  No edema; No deformity  SKIN: Warm and dry NEUROLOGIC:  Alert and oriented x 3 PSYCHIATRIC:  Normal affect   ASSESSMENT:    1. S/P mitral valve clip implantation   2. Chronic diastolic heart failure (HCC)   3. Essential hypertension   4. Severe pulmonary hypertension (HCC)   5. Severe tricuspid regurgitation    PLAN:    In order of problems listed above:  Severe MR s/p mTEER: echo today shows EF 55-60%, s/p mTEER with mild residual MR with mean graident of 7 mm hg as well as severe TR and pulm HTN. She has had a marked clinical improvement with NYHA class I symptoms. Continue ASA 81mg  QD. She will require lifelong dental SBE. She has Amoxicillin. Will see her back for 1 year follow up and echo.    Chronic diastolic CHF: appears euvolemic today. Takes PRN Lasix however has not needed this recently.    HTN: BP severely elevated today and remained ~200/100 on both arms on personal rechecks. Will plan to continue home Coreg 25mg  BID and increase lisinopril from 10mg  to 20mg  daily and give her PRN hydralazine 25mg  to take as needed for BP >180/90. She was completely asymptomatic with this. Review of previous BPs in the office were in the normal range. She reports compliance with her meds and salt restrictions. She will keep a daily log at home and we will see her back next week to follow. BMET at follow up.    Pulmonary HTN: remained severe by echo today. Continue to follow on surveillance imaging.   Severe TR: no s/s volume overload. Continue medical  therapy   Medication Adjustments/Labs and Tests Ordered: Current medicines are reviewed  at length with the patient today.  Concerns regarding medicines are outlined above.  Orders Placed This Encounter  Procedures   EKG 12-Lead   Meds ordered this encounter  Medications   lisinopril (ZESTRIL) 20 MG tablet    Sig: Take 1 tablet (20 mg total) by mouth daily.    Dispense:  90 tablet    Refill:  3   hydrALAZINE (APRESOLINE) 25 MG tablet    Sig: Take 1 tablet (25 mg total) by mouth as needed (IF BLOOD PRESSURE IS 180/90 OR GREATER). IF BLOOD PRESSURE IS 180/90 OR GREATER    Dispense:  90 tablet    Refill:  3    Patient Instructions  Medication Instructions:  Your physician has recommended you make the following change in your medication:  INCREASE LISINOPRIL 20 MG DAILY HYDRALAZINE 25 MG AS NEEDED IF BLOOD PRESSURE IS 180/90 OR GREATER  *If you need a refill on your cardiac medications before your next appointment, please call your pharmacy*   Lab Work: NONE If you have labs (blood work) drawn today and your tests are completely normal, you will receive your results only by: MyChart Message (if you have MyChart) OR A paper copy in the mail If you have any lab test that is abnormal or we need to change your treatment, we will call you to review the results.   Testing/Procedures: NONE   Follow-Up: At [facility], you and your health needs are our priority.  As part of our continuing mission to provide you with exceptional heart care, we have created designated Provider Care Teams.  These Care Teams include your primary Cardiologist (physician) and Advanced Practice Providers (APPs -  Physician Assistants and Nurse Practitioners) who all work together to provide you with the care you need, when you need it.  We recommend signing up for the patient portal called "MyChart".  Sign up information is provided on this After Visit Summary.  MyChart is used to connect with  patients for Virtual Visits (Telemedicine).  Patients are able to view lab/test results, encounter notes, upcoming appointments, etc.  Non-urgent messages can be sent to your provider as well.   To learn more about what you can do with MyChart, go to [email or web address].    Your next appointment:   KEEP SCHEDULED APPOINTMENT    Signed, Cline Crock, PA-C  04/21/2023 10:48 AM    [facility]

## 2023-04-20 NOTE — Patient Instructions (Signed)
Medication Instructions:  Your physician has recommended you make the following change in your medication:  INCREASE LISINOPRIL 20 MG DAILY HYDRALAZINE 25 MG AS NEEDED IF BLOOD PRESSURE IS 180/90 OR GREATER  *If you need a refill on your cardiac medications before your next appointment, please call your pharmacy*   Lab Work: NONE If you have labs (blood work) drawn today and your tests are completely normal, you will receive your results only by: MyChart Message (if you have MyChart) OR A paper copy in the mail If you have any lab test that is abnormal or we need to change your treatment, we will call you to review the results.   Testing/Procedures: NONE   Follow-Up: At [facility], you and your health needs are our priority.  As part of our continuing mission to provide you with exceptional heart care, we have created designated Provider Care Teams.  These Care Teams include your primary Cardiologist (physician) and Advanced Practice Providers (APPs -  Physician Assistants and Nurse Practitioners) who all work together to provide you with the care you need, when you need it.  We recommend signing up for the patient portal called "MyChart".  Sign up information is provided on this After Visit Summary.  MyChart is used to connect with patients for Virtual Visits (Telemedicine).  Patients are able to view lab/test results, encounter notes, upcoming appointments, etc.  Non-urgent messages can be sent to your provider as well.   To learn more about what you can do with MyChart, go to [email or web address].    Your next appointment:   KEEP SCHEDULED APPOINTMENT

## 2023-04-21 ENCOUNTER — Other Ambulatory Visit: Payer: Self-pay | Admitting: Physician Assistant

## 2023-04-21 DIAGNOSIS — I340 Nonrheumatic mitral (valve) insufficiency: Secondary | ICD-10-CM

## 2023-04-25 ENCOUNTER — Encounter: Payer: Medicare Other | Admitting: Family Medicine

## 2023-04-25 NOTE — Progress Notes (Signed)
No show

## 2023-04-26 NOTE — Progress Notes (Unsigned)
[facility]   MULTIDISCIPLINARY HEART VALVE CLINIC                                     Cardiology Office Note:    Date:  04/27/2023   ID:  Erin [name], DOB 1935/02/09, MRN [id number]  PCP:  Jeoffrey Massed, MD  [facility] Cardiologist:  Tonny Bollman, MD  [facility] Electrophysiologist:  None   Referring MD: Jeoffrey Massed, MD   Chief Complaint  Patient presents with   Follow-up   History of Present Illness:    Erin [name] is a 88 y.o. female with a hx of HTN, OA, chronic diastolic CHF, severe pulmonary HTN, moderate TR and severe MR s/p mTEER (10/2020, 02/2023) who presents to clinic for follow up of her BP.    The patient underwent transcatheter edge-to-edge repair of the mitral valve for treatment of prolapse and flail of the posterior leaflet in 10/2020. Initially, she had an acceptable result with mild to moderate residual mitral regurgitation. However, on follow-up imaging she was demonstrated to have severe mitral regurgitation confirmed by TEE assessment.  She was scheduled for redo mitral transcatheter edge-to-edge repair, but the patient canceled her procedure. She then returned to the office with progressive shortness of breath and orthopnea and decided to proceed with surgery.    Erin [name] is now s/p successful TEER with a single MitraClip NTW device, reducing mitral regurgitation from 4+ to 1+. Device placed A2/P2 (lateral to the previously implanted clip device). POD1 echo with prior XTW clip to A2/P2 and new clip NTW lateral to original with mild residual MR lateral to both clips. MVA 1.4 cm2, mean gradient 7, and peak 15 mmHg with a HR 63  bpm. She was continued on ASA 81mg  QD.   In follow up, the patients BP found to be severely elevated at 196/98. Her lisinopril was increased to 20mg  and PRN hydralazine was added. Today she presents to clinic for follow up and states that her BP is improved although with vast fluctuations. Spoke with  pharmacy today about adjusting medications for more steady control. Otherwise she is doing well with no chest pain, SOB, palpitations, LE edema, orthopnea, PND, dizziness, or syncope.   Past Medical History:  Diagnosis Date   Achilles tendon rupture    left, no surgery required   Chronic diastolic heart failure (HCC)    Gross hematuria 12/2020   Klebsiella on urine clx x2 but no UTI sx's.  CT showed tiny stones in each renal pelvis but no ureteral or bladder stones, no mass.  Urol did cysto->normal. Plan is annual UA, rpt hematuria w/u in 3-5 yrs if persistent pos.   History of kidney stones    Hypertension    Hypertensive retinopathy of both eyes    Dr. Elmer Picker   Low back pain    spondylosis, listhesis, +scoliosis at L/S jxn   Lower extremity edema    lasix prn   OA (osteoarthritis)    Osteopenia    Osteoporosis 07/2021   07/2021 T score -4.6   S/P mitral valve clip implantation 11/06/2020   s/p TEER with one XTW MitraClip on A2P2 by Dr. Excell Seltzer.  DAPT w/ASA and plavix x 3 mo post procedure recommended.   Severe mitral regurgitation    Mitral valve clip 10/2020.  Clip #25 January 2023   Severe pulmonary hypertension (HCC) 2021   transth  echo; moderate by TEE 08/2020   Systolic murmur 07/03/2020   severe mitral regurge, mod/severe tricuspic regurg   Tremor of right hand     Past Surgical History:  Procedure Laterality Date   APPENDECTOMY     88 yrs old   BACK SURGERY  2004   ruptured disc   CHOLECYSTECTOMY     age 23   CYSTOSCOPY  02/24/2021   (for gross hematuria) ->normal.   DEXA  07/2021   2022 T score -4.6 (radius)   LUMBAR LAMINECTOMY  2004   MITRAL VALVE REPAIR N/A 11/06/2020   Procedure: MITRAL VALVE REPAIR;  Surgeon: Tonny Bollman, MD;  Location: [facility] INVASIVE CV LAB;  Service: Cardiovascular;  Laterality: N/A;   MITRAL VALVE REPAIR N/A 03/02/2023   Procedure: MITRAL VALVE REPAIR;  Surgeon: Tonny Bollman, MD;  Location: [facility] INVASIVE CV LAB;  Service: Cardiovascular;   Laterality: N/A;   RIGHT/LEFT HEART CATH AND CORONARY ANGIOGRAPHY N/A 09/16/2020   No CAD. Procedure: RIGHT/LEFT HEART CATH AND CORONARY ANGIOGRAPHY;  Surgeon: Lyn Records, MD;  Location: [facility] INVASIVE CV LAB;  Service: Cardiovascular;  Laterality: N/A;   TEE WITHOUT CARDIOVERSION N/A 09/16/2020   Procedure: TRANSESOPHAGEAL ECHOCARDIOGRAM (TEE);  Surgeon: Jodelle Red, MD;  Location: [facility] ENDOSCOPY;  Service: Cardiovascular;  Laterality: N/A;   TEE WITHOUT CARDIOVERSION N/A 11/06/2020   Procedure: TRANSESOPHAGEAL ECHOCARDIOGRAM (TEE);  Surgeon: Tonny Bollman, MD;  Location: [facility] INVASIVE CV LAB;  Service: Cardiovascular;  Laterality: N/A;   TEE WITHOUT CARDIOVERSION N/A 06/16/2022   Procedure: TRANSESOPHAGEAL ECHOCARDIOGRAM (TEE);  Surgeon: Thurmon Fair, MD;  Location: [facility] ENDOSCOPY;  Service: Cardiovascular;  Laterality: N/A;   TEE WITHOUT CARDIOVERSION N/A 03/02/2023   Procedure: TRANSESOPHAGEAL ECHOCARDIOGRAM;  Surgeon: Tonny Bollman, MD;  Location: [facility] INVASIVE CV LAB;  Service: Cardiovascular;  Laterality: N/A;   TONSILLECTOMY     age 39   TOTAL KNEE ARTHROPLASTY Left 06/02/2021   Procedure: LEFT TOTAL KNEE ARTHROPLASTY;  Surgeon: Sheral Apley, MD;  Location: [facility] ORS;  Service: Orthopedics;  Laterality: Left;   TRANSTHORACIC ECHOCARDIOGRAM  07/03/2020; 10/28/20; 12/03/20   06/2020 TTE EF 60-65%, grd II DD, severe pulm art HTN, severe mitral valve regurg, mod/sev tricuspic valve regurg. Conf on TEE 08/2020.  10/28/20 (s/p MV clip procedure) mild-mod MVR, EF 55-60%. 11/2020 EF 60%, mild/mod MR w/mean grad 4mm hg and mod to sev TR.   TRANSTHORACIC ECHOCARDIOGRAM     10/2022 EF 60-65%, nl LV fxn, grd II DD, severe MV clip regurg, severe pulm HTN.  02/01/23 (post MV clip#2: EF 60-65%, nl RV and LV fxn, inc PA pressure, severe tricusp regurg, no signif MR.    Current Medications: Current Meds  Medication Sig   amoxicillin (AMOXIL) 500 MG capsule SMARTSIG:4 Capsule(s) By Mouth   Ascorbic  Acid (VITAMIN C) 1000 MG tablet Take 1,000 mg by mouth daily.   azelastine (ASTELIN) 0.1 % nasal spray Place 2 sprays into both nostrils 2 (two) times daily. (Patient taking differently: Place 2 sprays into both nostrils daily as needed for allergies.)   Calcium Carb-Cholecalciferol (CALCIUM 600 + D PO) Take 1 tablet by mouth daily.   carvedilol (COREG) 25 MG tablet Take 1 tablet (25 mg total) by mouth in the morning and at bedtime.   cyanocobalamin (VITAMIN B12) 1000 MCG tablet Take 1,000 mcg by mouth daily.   furosemide (LASIX) 20 MG tablet Take 1 tablet (20 mg total) by mouth daily as needed for edema.   irbesartan (AVAPRO) 300 MG tablet Take 1 tablet (300 mg  total) by mouth daily.   Multiple Vitamin (MULTIVITAMIN WITH MINERALS) TABS tablet Take 1 tablet by mouth in the morning.   Multiple Vitamins-Minerals (PRESERVISION AREDS PO) Take 1 tablet by mouth in the morning and at bedtime.    nitroGLYCERIN (NITRODUR - DOSED IN MG/24 HR) 0.2 mg/hr patch 1/2 patch over L Achilles tendon bid (Patient taking differently: Place 0.2 mg onto the skin daily as needed (blood circulation). On left foot)   omeprazole (PRILOSEC) 20 MG capsule Take 20 mg by mouth daily.   potassium chloride (KLOR-CON) 10 MEQ tablet Take 10 mEq by mouth daily as needed (with furosemide (fluid retention)).   sodium chloride (MURO 128) 5 % ophthalmic ointment Place 1 Application into both eyes at bedtime as needed (dry eyes).   vitamin E 180 MG (400 UNITS) capsule Take 400 Units by mouth in the morning.   [DISCONTINUED] amoxicillin (AMOXIL) 500 MG tablet Take 4 tablets (2,000 mg total) by mouth as directed. Take 4 tablets (2,000 mg total) by mouth 1 hour prior to all dental visits.   [DISCONTINUED] lisinopril (ZESTRIL) 20 MG tablet Take 1 tablet (20 mg total) by mouth daily.     Allergies:   Advil [ibuprofen], Barbiturates, Demerol, Doxycycline, Hydrochlorothiazide, Irbesartan, Losartan potassium, Olmesartan medoxomil, and  Telmisartan   Social History   Socioeconomic History   Marital status: Married    Spouse name: Not on file   Number of children: Not on file   Years of education: Not on file   Highest education level: Not on file  Occupational History   Not on file  Tobacco Use   Smoking status: Never   Smokeless tobacco: Never  Vaping Use   Vaping Use: Never used  Substance and Sexual Activity   Alcohol use: No   Drug use: Never   Sexual activity: Not Currently  Other Topics Concern   Not on file  Social History Narrative   Married, 6 children, about 15 GC.  8 GGC   Former Diplomatic Services operational officer at [employer].  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   No tob.   No alc.   Social Determinants of Health   Financial Resource Strain: Low Risk  (11/03/2022)   Overall Financial Resource Strain (CARDIA)    Difficulty of Paying Living Expenses: Not hard at all  Food Insecurity: No Food Insecurity (11/03/2022)   Hunger Vital Sign    Worried About Running Out of Food in the Last Year: Never true    Ran Out of Food in the Last Year: Never true  Transportation Needs: No Transportation Needs (11/03/2022)   PRAPARE - Administrator, Civil Service (Medical): No    Lack of Transportation (Non-Medical): No  Physical Activity: Insufficiently Active (11/03/2022)   Exercise Vital Sign    Days of Exercise per Week: 7 days    Minutes of Exercise per Session: 20 min  Stress: No Stress Concern Present (11/03/2022)   [employer] of Occupational Health - Occupational Stress Questionnaire    Feeling of Stress : Not at all  Social Connections: Socially Integrated (11/03/2022)   Social Connection and Isolation Panel [NHANES]    Frequency of Communication with Friends and Family: More than three times a week    Frequency of Social Gatherings with Friends and Family: More than three times a week    Attends Religious Services: More than 4 times per year    Active Member of [employer] or  Organizations:  Yes    Attends Banker Meetings: More than 4 times per year    Marital Status: Married     Family History: The patient's family history includes Cancer in her father; Early death in her father; Hypertension in her mother and another family member; Liver cancer in her father. There is no history of Colon cancer or Rectal cancer.  ROS:   Please see the history of present illness.    All other systems reviewed and are negative.  EKGs/Labs/Other Studies Reviewed:    The following studies were reviewed today:  Cardiac Studies & Procedures   CARDIAC CATHETERIZATION  CARDIAC CATHETERIZATION 03/02/2023  Narrative Successful transcatheter edge-to-edge repair of the mitral valve with a single MitraClip NTW device, reducing mitral regurgitation from 4+ to 1+, device placed A2/P2 (lateral to the previously implanted clip device)  See full Op Note for details   CARDIAC CATHETERIZATION  CARDIAC CATHETERIZATION 11/06/2020  Narrative Successful transcatheter edge-to-edge repair of the mitral valve with a MitraClip XTW device, placed A2/P2, reducing MR from 4+ at baseline to 1-2+ post-procedure     ECHOCARDIOGRAM  ECHOCARDIOGRAM COMPLETE 04/20/2023  Narrative ECHOCARDIOGRAM REPORT    Patient Name:   Erin [name] [facility] Date of Exam: 04/20/2023 Medical Rec #:  [id number]       Height:       63.0 in Accession #:    [id number]      Weight:       119.8 lb Date of Birth:  02-08-1935        BSA:          1.555 m Patient Age:    88 years        BP:           138/62 mmHg Patient Gender: F               HR:           70 bpm. Exam Location:  [address]  Procedure: 2D Echo, Cardiac Doppler and Color Doppler  Indications:    Z98.890, Z95.818 S/P mitral valve clip implantation  History:        Patient has prior history of Echocardiogram examinations, most recent 03/03/2023. CHF, Stroke, Signs/Symptoms:Fatigue; Risk Factors:Hypertension. Pulmonary hypertension. Severe  mitral regurgitation.  Mitral Valve: Mitra-Clip valve is present in the mitral position. Procedure Date: 03/02/23.  Sonographer:    Cathie Beams RCS Referring Phys: [phone] Joda Braatz D Aylin Rhoads  IMPRESSIONS   1. Left ventricular ejection fraction, by estimation, is 55 to 60%. The left ventricle has normal function. The left ventricle has no regional wall motion abnormalities. Left ventricular diastolic function could not be evaluated. 2. Right ventricular systolic function is normal. The right ventricular size is normal. There is severely elevated pulmonary artery systolic pressure. The estimated right ventricular systolic pressure is 75.2 mmHg. 3. Left atrial size was severely dilated. 4. Right atrial size was severely dilated. 5. The mitral valve has been repaired/replaced. Mild mitral valve regurgitation. Moderate mitral stenosis. The mean mitral valve gradient is 7.0 mmHg. There is a Mitra-Clip present in the mitral position. Procedure Date: 03/02/23. 6. Tricuspid valve regurgitation is severe. 7. The aortic valve is tricuspid. There is mild calcification of the aortic valve. There is mild thickening of the aortic valve. Aortic valve regurgitation is trivial. Aortic valve sclerosis is present, with no evidence of aortic valve stenosis. 8. Aortic dilatation noted. 9. The inferior vena cava is dilated in size with <50% respiratory variability, suggesting right atrial pressure of  15 mmHg. 10. Evidence of atrial level shunting detected by color flow Doppler. There is a small atrial septal defect with predominantly left to right shunting across the atrial septum.  Comparison(s): No significant change from prior study.  FINDINGS Left Ventricle: Left ventricular ejection fraction, by estimation, is 55 to 60%. The left ventricle has normal function. The left ventricle has no regional wall motion abnormalities. The left ventricular internal cavity size was normal in size. There is no left ventricular  hypertrophy. Left ventricular diastolic function could not be evaluated due to mitral valve repair. Left ventricular diastolic function could not be evaluated.  Right Ventricle: The right ventricular size is normal. Right vetricular wall thickness was not well visualized. Right ventricular systolic function is normal. There is severely elevated pulmonary artery systolic pressure. The tricuspid regurgitant velocity is 3.88 m/s, and with an assumed right atrial pressure of 15 mmHg, the estimated right ventricular systolic pressure is 75.2 mmHg.  Left Atrium: Left atrial size was severely dilated.  Right Atrium: Right atrial size was severely dilated.  Pericardium: There is no evidence of pericardial effusion.  Mitral Valve: The mitral valve has been repaired/replaced. Mild mitral valve regurgitation. There is a Mitra-Clip present in the mitral position. Procedure Date: 03/02/23. Moderate mitral valve stenosis. MV peak gradient, 13.4 mmHg. The mean mitral valve gradient is 7.0 mmHg.  Tricuspid Valve: The tricuspid valve is normal in structure. Tricuspid valve regurgitation is severe. No evidence of tricuspid stenosis.  Aortic Valve: The aortic valve is tricuspid. There is mild calcification of the aortic valve. There is mild thickening of the aortic valve. Aortic valve regurgitation is trivial. Aortic valve sclerosis is present, with no evidence of aortic valve stenosis.  Pulmonic Valve: The pulmonic valve was grossly normal. Pulmonic valve regurgitation is trivial. No evidence of pulmonic stenosis.  Aorta: Aortic dilatation noted.  Venous: The inferior vena cava is dilated in size with less than 50% respiratory variability, suggesting right atrial pressure of 15 mmHg.  IAS/Shunts: Evidence of atrial level shunting detected by color flow Doppler. There is a small atrial septal defect with predominantly left to right shunting across the atrial septum.   LEFT VENTRICLE PLAX 2D LVIDd:          4.20 cm LVIDs:         3.00 cm LV PW:         0.80 cm LV IVS:        0.80 cm LVOT diam:     1.90 cm LV SV:         44 LV SV Index:   28 LVOT Area:     2.84 cm   RIGHT VENTRICLE RV Basal diam:  3.90 cm RV S prime:     9.14 cm/s TAPSE (M-mode): 2.5 cm RVSP:           75.2 mmHg  LEFT ATRIUM              Index        RIGHT ATRIUM            Index LA diam:        3.80 cm  2.44 cm/m   RA Pressure: 15.00 mmHg LA Vol (A2C):   74.8 ml  48.10 ml/m  RA Area:     26.50 cm LA Vol (A4C):   103.0 ml 66.23 ml/m  RA Volume:   90.00 ml   57.87 ml/m LA Biplane Vol: 94.0 ml  60.44 ml/m AORTIC VALVE  PULMONIC VALVE LVOT Vmax:   69.40 cm/s  PR End Diast Vel: 9.24 msec LVOT Vmean:  47.700 cm/s LVOT VTI:    0.155 m  AORTA Ao Root diam: 2.50 cm Ao Asc diam:  3.40 cm  MITRAL VALVE                TRICUSPID VALVE MV Area (PHT): 1.20 cm     TR Peak grad:   60.2 mmHg MV Area VTI:   0.69 cm     TR Vmax:        388.00 cm/s MV Peak grad:  13.4 mmHg    Estimated RAP:  15.00 mmHg MV Mean grad:  7.0 mmHg     RVSP:           75.2 mmHg MV Vmax:       1.83 m/s MV Vmean:      131.0 cm/s   SHUNTS MV Decel Time: 630 msec     Systemic VTI:  0.16 m MR Peak grad: 58.7 mmHg     Systemic Diam: 1.90 cm MR Mean grad: 34.0 mmHg MR Vmax:      383.00 cm/s MR Vmean:     275.0 cm/s MV E velocity: 148.00 cm/s MV A velocity: 146.00 cm/s MV E/A ratio:  1.01  Jodelle Red MD Electronically signed by Jodelle Red MD Signature Date/Time: 04/20/2023/9:43:02 PM    Final   TEE  ECHO TEE 03/02/2023  Narrative TRANSESOPHOGEAL ECHO REPORT    Patient Name:   Erin [name] [facility] Date of Exam: 03/02/2023 Medical Rec #:  [id number]       Height:       63.0 in Accession #:    [id number]      Weight:       115.0 lb Date of Birth:  1935/04/23        BSA:          1.528 m Patient Age:    88 years        BP:           140/57 mmHg Patient Gender: F               HR:           58 bpm. Exam  Location:  Inpatient  Procedure: Transesophageal Echo, 3D Echo, Color Doppler and Cardiac Doppler  Indications:     Mitral Valve Regurgitation i34.1  History:         Patient has prior history of Echocardiogram examinations, most recent 11/08/2022. Risk Factors:Hypertension. Mitraclip XTW placed A2P2 11/06/20.  Sonographer:     Irving Burton Senior RDCS Referring Phys:  [phone] MICHAEL COOPER Diagnosing Phys: Charlton Haws MD   Sonographer Comments: Mitraclip Procedure   PROCEDURE: After discussion of the risks and benefits of a TEE, an informed consent was obtained from the patient. The transesophogeal probe was passed without difficulty through the esophogus of the patient. Sedation performed by different physician. The patient was monitored while under deep sedation. The patient developed no complications during the procedure.  IMPRESSIONS   1. Left ventricular ejection fraction, by estimation, is 55 to 60%. The left ventricle has normal function. 2. Right ventricular systolic function is mildly reduced. The right ventricular size is mildly enlarged. 3. Left atrial size was severely dilated. No left atrial/left atrial appendage thrombus was detected. 4. Right atrial size was moderately dilated. 5. Prior XTW clip to A2/P2 intact. Residual posterior P2 leaflet flail/prolapse with severe anteriorly directed MR. Extensive 3D imaging done to  guid placement of of a 2 nd NTW clip lateral to original one. MR reduced from plus 4 to plus 1. Mean diastolic graient 5 peak 11 mmHg at HR of 47 bpm. The mitral valve is myxomatous. Severe mitral valve regurgitation. 6. Tricuspid valve regurgitation is severe. 7. The aortic valve is tricuspid. There is mild calcification of the aortic valve. There is mild thickening of the aortic valve. Aortic valve regurgitation is trivial. Aortic valve sclerosis is present, with no evidence of aortic valve stenosis. 8. Evidence of atrial level shunting detected by color flow  Doppler. Post trans septal only left to right shunting.  FINDINGS Left Ventricle: Left ventricular ejection fraction, by estimation, is 55 to 60%. The left ventricle has normal function. The left ventricular internal cavity size was normal in size.  Right Ventricle: The right ventricular size is mildly enlarged. Right vetricular wall thickness was not assessed. Right ventricular systolic function is mildly reduced.  Left Atrium: Left atrial size was severely dilated. No left atrial/left atrial appendage thrombus was detected.  Right Atrium: Right atrial size was moderately dilated.  Pericardium: There is no evidence of pericardial effusion.  Mitral Valve: Prior XTW clip to A2/P2 intact. Residual posterior P2 leaflet flail/prolapse with severe anteriorly directed MR. Extensive 3D imaging done to guid placement of of a 2 nd NTW clip lateral to original one. MR reduced from plus 4 to plus 1. Mean diastolic graient 5 peak 11 mmHg at HR of 47 bpm. The mitral valve is myxomatous. Severe mitral valve regurgitation.  Tricuspid Valve: The tricuspid valve is normal in structure. Tricuspid valve regurgitation is severe.  Aortic Valve: The aortic valve is tricuspid. There is mild calcification of the aortic valve. There is mild thickening of the aortic valve. Aortic valve regurgitation is trivial. Aortic valve sclerosis is present, with no evidence of aortic valve stenosis.  Pulmonic Valve: The pulmonic valve was normal in structure. Pulmonic valve regurgitation is mild.  Aorta: The aortic root is normal in size and structure.  IAS/Shunts: Evidence of atrial level shunting detected by color flow Doppler. Post trans septal only left to right shunting.  Additional Comments: Spectral Doppler performed.  MR Peak grad:    88.4 mmHg MR Mean grad:    43.0 mmHg MR Vmax:         470.00 cm/s MR Vmean:        285.0 cm/s MR PISA:         6.28 cm MR PISA Eff ROA: 47 mm MR PISA Radius:  1.00 cm  Charlton Haws MD Electronically signed by Charlton Haws MD Signature Date/Time: 03/02/2023/2:56:24 PM    Final   MONITORS  LONG TERM MONITOR (3-14 DAYS) 07/06/2022  Narrative Patch Wear Time:  3 days and 3 hours (2023-08-27T08:24:03-399 to 2023-08-30T11:24:29-0400)  Patient had a min HR of 43 bpm, max HR of 132 bpm, and avg HR of 60 bpm. Predominant underlying rhythm was Sinus Rhythm. Inverted QRS complexes possibly due to inverted placement of device. 6 Supraventricular Tachycardia runs occurred, the run with the fastest interval lasting 6 beats with a max rate of 132 bpm, the longest lasting 12 beats with an avg rate of 112 bpm. Isolated SVEs were rare (<1.0%), SVE Couplets were rare (<1.0%), and SVE Triplets were rare (<1.0%). Isolated VEs were rare (<1.0%), and no VE Couplets or VE Triplets were present.  SUMMARY: The basic rhythm is normal sinus with an average heart rate of 60 bpm.  There is no atrial fibrillation or flutter.  There were rare supraventricular runs, the longest lasting 12 beats.  There is no high-grade heart block.  There are no sustained arrhythmias.          EKG:  EKG is not ordered today.    Recent Labs: 02/28/2023: ALT 11; B Natriuretic Peptide 659.1 03/03/2023: BUN 20; Creatinine, Ser 0.65; Hemoglobin 12.1; Platelets 225; Potassium 4.3; Sodium 137  Recent Lipid Panel    Component Value Date/Time   CHOL 139 07/02/2020 1057   TRIG 30.0 07/02/2020 1057   TRIG 33 09/21/2006 1115   HDL 80.30 07/02/2020 1057   CHOLHDL 2 07/02/2020 1057   VLDL 6.0 07/02/2020 1057   LDLCALC 53 07/02/2020 1057   Physical Exam:    VS:  BP (!) 134/52   Pulse 61   Ht 5\' 3"  (1.6 m)   Wt 113 lb 6.4 oz (51.4 kg)   SpO2 99%   BMI 20.09 kg/m     Wt Readings from Last 3 Encounters:  04/27/23 113 lb 6.4 oz (51.4 kg)  04/20/23 115 lb 12.8 oz (52.5 kg)  03/09/23 119 lb 12.8 oz (54.3 kg)    General: Well developed, well nourished, NAD Lungs:Clear to ausculation bilaterally. No wheezes,  rales, or rhonchi. Breathing is unlabored. Cardiovascular: RRR with S1 S2. Soft murmur Extremities: No edema.  Neuro: Alert and oriented. No focal deficits. No facial asymmetry. MAE spontaneously. Psych: Responds to questions appropriately with normal affect.   ASSESSMENT/PLAN:    HTN: BP severely elevated at recent follow up. Lisinopril was increased to 20mg  and PRN hydralazine was added . She brings BP log that shows vast swings in her pressures. Discussed plan with pharmacy with recommendations to transition lisinopril to irbesartan 300mg  every day for longer duration control. Will keep carvedilol 25mg  BID for now and PRN hydralazine although will have her maybe do 12.5mg  given some dizziness with the full tablet. She sees her PCP next week and will monitor her BP closely until then. BMET at that time. She has a scheduled appointment with Dr. Excell Seltzer 9/30.   Severe MR s/p mTEER: Last echo shows EF 55-60%, s/p mTEER with mild residual MR with mean graident of 7 mm hg as well as severe TR and pulm HTN. She has had a marked clinical improvement with NYHA class I symptoms. Continue ASA 81mg  QD. She will require lifelong dental SBE. She has Amoxicillin. Will see her back for 1 year follow up and echo.    Chronic diastolic CHF: Appears euvolemic today. Takes PRN Lasix however has not needed this recently.    Pulmonary HTN: Remained severe by last echo. Continue to follow on surveillance imaging.    Severe TR: No s/s volume overload. Continue medical therapy   Medication Adjustments/Labs and Tests Ordered: Current medicines are reviewed at length with the patient today.  Concerns regarding medicines are outlined above.  No orders of the defined types were placed in this encounter.  Meds ordered this encounter  Medications   irbesartan (AVAPRO) 300 MG tablet    Sig: Take 1 tablet (300 mg total) by mouth daily.    Dispense:  90 tablet    Refill:  3    Patient Instructions  Medication  Instructions:  Your physician has recommended you make the following change in your medication:  STOP LISINOPRIL  START IRBESARTAN 300 MG DAILY   *If you need a refill on your cardiac medications before your next appointment, please call your pharmacy*   Lab Work: NONE If you have labs (blood  work) drawn today and your tests are completely normal, you will receive your results only by: MyChart Message (if you have MyChart) OR A paper copy in the mail If you have any lab test that is abnormal or we need to change your treatment, we will call you to review the results.   Testing/Procedures: NONE   Follow-Up: At [facility], you and your health needs are our priority.  As part of our continuing mission to provide you with exceptional heart care, we have created designated Provider Care Teams.  These Care Teams include your primary Cardiologist (physician) and Advanced Practice Providers (APPs -  Physician Assistants and Nurse Practitioners) who all work together to provide you with the care you need, when you need it.  We recommend signing up for the patient portal called "MyChart".  Sign up information is provided on this After Visit Summary.  MyChart is used to connect with patients for Virtual Visits (Telemedicine).  Patients are able to view lab/test results, encounter notes, upcoming appointments, etc.  Non-urgent messages can be sent to your provider as well.   To learn more about what you can do with MyChart, go to [email or web address].    Your next appointment:   KEEP FOLLOW UP   Signed, Georgie Chard, NP  04/27/2023 3:47 PM    [facility]

## 2023-04-27 ENCOUNTER — Ambulatory Visit: Payer: Medicare Other | Attending: Internal Medicine | Admitting: Cardiology

## 2023-04-27 VITALS — BP 134/52 | HR 61 | Ht 63.0 in | Wt 113.4 lb

## 2023-04-27 DIAGNOSIS — I10 Essential (primary) hypertension: Secondary | ICD-10-CM

## 2023-04-27 DIAGNOSIS — Z95818 Presence of other cardiac implants and grafts: Secondary | ICD-10-CM

## 2023-04-27 DIAGNOSIS — I272 Pulmonary hypertension, unspecified: Secondary | ICD-10-CM

## 2023-04-27 DIAGNOSIS — I340 Nonrheumatic mitral (valve) insufficiency: Secondary | ICD-10-CM | POA: Diagnosis not present

## 2023-04-27 DIAGNOSIS — Z9889 Other specified postprocedural states: Secondary | ICD-10-CM | POA: Diagnosis not present

## 2023-04-27 MED ORDER — IRBESARTAN 300 MG PO TABS: 300.0000 mg | ORAL_TABLET | Freq: Every day | ORAL | 3 refills | Status: DC

## 2023-04-27 NOTE — Patient Instructions (Signed)
Medication Instructions:  Your physician has recommended you make the following change in your medication:  STOP LISINOPRIL  START IRBESARTAN 300 MG DAILY   *If you need a refill on your cardiac medications before your next appointment, please call your pharmacy*   Lab Work: NONE If you have labs (blood work) drawn today and your tests are completely normal, you will receive your results only by: MyChart Message (if you have MyChart) OR A paper copy in the mail If you have any lab test that is abnormal or we need to change your treatment, we will call you to review the results.   Testing/Procedures: NONE   Follow-Up: At [facility], you and your health needs are our priority.  As part of our continuing mission to provide you with exceptional heart care, we have created designated Provider Care Teams.  These Care Teams include your primary Cardiologist (physician) and Advanced Practice Providers (APPs -  Physician Assistants and Nurse Practitioners) who all work together to provide you with the care you need, when you need it.  We recommend signing up for the patient portal called "MyChart".  Sign up information is provided on this After Visit Summary.  MyChart is used to connect with patients for Virtual Visits (Telemedicine).  Patients are able to view lab/test results, encounter notes, upcoming appointments, etc.  Non-urgent messages can be sent to your provider as well.   To learn more about what you can do with MyChart, go to [email or web address].    Your next appointment:   KEEP FOLLOW UP

## 2023-05-09 NOTE — Patient Instructions (Addendum)
Take 2 of your 25mg  hydralazine tabs every night at supper. Take your coreg every morning and every evening. Take your irbesartan every morning.

## 2023-05-10 ENCOUNTER — Encounter (INDEPENDENT_AMBULATORY_CARE_PROVIDER_SITE_OTHER): Payer: Medicare Other | Admitting: Ophthalmology

## 2023-05-12 ENCOUNTER — Encounter: Payer: Self-pay | Admitting: Family Medicine

## 2023-05-12 ENCOUNTER — Ambulatory Visit (INDEPENDENT_AMBULATORY_CARE_PROVIDER_SITE_OTHER): Payer: Medicare Other | Admitting: Family Medicine

## 2023-05-12 VITALS — BP 144/78 | HR 62 | Wt 118.0 lb

## 2023-05-12 DIAGNOSIS — M545 Low back pain, unspecified: Secondary | ICD-10-CM | POA: Diagnosis not present

## 2023-05-12 DIAGNOSIS — R2681 Unsteadiness on feet: Secondary | ICD-10-CM

## 2023-05-12 DIAGNOSIS — I10 Essential (primary) hypertension: Secondary | ICD-10-CM | POA: Diagnosis not present

## 2023-05-12 DIAGNOSIS — M810 Age-related osteoporosis without current pathological fracture: Secondary | ICD-10-CM

## 2023-05-12 DIAGNOSIS — G8929 Other chronic pain: Secondary | ICD-10-CM

## 2023-05-12 LAB — VITAMIN D 25 HYDROXY (VIT D DEFICIENCY, FRACTURES): VITD: 29.48 ng/mL — ABNORMAL LOW (ref 30.00–100.00)

## 2023-05-12 MED ORDER — CARVEDILOL 25 MG PO TABS: 25.0000 mg | ORAL_TABLET | Freq: Two times a day (BID) | ORAL | 1 refills | Status: DC

## 2023-05-12 MED ORDER — HYDROCODONE-ACETAMINOPHEN 5-325 MG PO TABS: ORAL_TABLET | ORAL | 0 refills | Status: DC

## 2023-05-12 MED ORDER — IBANDRONATE SODIUM 150 MG PO TABS: 150.0000 mg | ORAL_TABLET | ORAL | 3 refills | Status: DC

## 2023-05-12 MED ORDER — AZELASTINE HCL 0.1 % NA SOLN: 2.0000 | Freq: Two times a day (BID) | NASAL | 12 refills | Status: DC

## 2023-05-12 NOTE — Progress Notes (Signed)
OFFICE VISIT  05/12/2023  CC:  Chief Complaint  Patient presents with   Medical Management of Chronic Issues    Pt states she is having some lower back pain, pt states she is not sure when it really started.     Patient is a 88 y.o. female who presents for 65-month follow-up hypertension, chronic diastolic heart failure, and mitral regurgitation.  INTERIM HX: Cardiology changed her lisin to irbesart a couple weeks ago. BP still mod to severe elev  at night. Morning and mid day usually normal, sometimes low. She has only been taking her Coreg in the morning because she thought it was giving her muscle cramps in the evening.  She has chronic right low back pain, she has known spondylosis as well as scoliosis. She has seen neurosurgery in the past and an epidural steroid injection did not help any. About 4 days ago she started feeling acute pain in the right SI joint.  No radicular pain.  No paresthesias.  No hip pain. She took an old oxycodone 5 mg at home and this helped a little bit but made her very nauseous.  ROS as above, plus--> no fevers, no CP, no SOB, no wheezing, no cough, no dizziness, no HAs, no rashes, no melena/hematochezia.  No polyuria or polydipsia.  No focal weakness, paresthesias, or tremors.  No acute vision or hearing abnormalities.  No dysuria or unusual/new urinary urgency or frequency.  No recent changes in lower legs.    Past Medical History:  Diagnosis Date   Achilles tendon rupture    left, no surgery required   Chronic diastolic heart failure (HCC)    Gross hematuria 12/2020   Klebsiella on urine clx x2 but no UTI sx's.  CT showed tiny stones in each renal pelvis but no ureteral or bladder stones, no mass.  Urol did cysto->normal. Plan is annual UA, rpt hematuria w/u in 3-5 yrs if persistent pos.   History of kidney stones    Hypertension    Hypertensive retinopathy of both eyes    Dr. Elmer Picker   Low back pain    spondylosis, listhesis, +scoliosis at L/S  jxn   Lower extremity edema    lasix prn   OA (osteoarthritis)    Osteopenia    Osteoporosis 07/2021   07/2021 T score -4.6   S/P mitral valve clip implantation 11/06/2020   s/p TEER with one XTW MitraClip on A2P2 by Dr. Excell Seltzer.  DAPT w/ASA and plavix x 3 mo post procedure recommended.   Severe mitral regurgitation    Mitral valve clip 10/2020.  Clip #25 January 2023   Severe pulmonary hypertension (HCC) 2021   transth echo; moderate by TEE 08/2020   Systolic murmur 07/03/2020   severe mitral regurge, mod/severe tricuspic regurg   Tremor of right hand     Past Surgical History:  Procedure Laterality Date   APPENDECTOMY     88 yrs old   BACK SURGERY  2004   ruptured disc   CHOLECYSTECTOMY     age 40   CYSTOSCOPY  02/24/2021   (for gross hematuria) ->normal.   DEXA  07/2021   2022 T score -4.6 (radius)   LUMBAR LAMINECTOMY  2004   MITRAL VALVE REPAIR N/A 11/06/2020   Procedure: MITRAL VALVE REPAIR;  Surgeon: Tonny Bollman, MD;  Location: [facility] INVASIVE CV LAB;  Service: Cardiovascular;  Laterality: N/A;   MITRAL VALVE REPAIR N/A 03/02/2023   Procedure: MITRAL VALVE REPAIR;  Surgeon: Tonny Bollman, MD;  Location:  [facility] INVASIVE CV LAB;  Service: Cardiovascular;  Laterality: N/A;   RIGHT/LEFT HEART CATH AND CORONARY ANGIOGRAPHY N/A 09/16/2020   No CAD. Procedure: RIGHT/LEFT HEART CATH AND CORONARY ANGIOGRAPHY;  Surgeon: Lyn Records, MD;  Location: [facility] INVASIVE CV LAB;  Service: Cardiovascular;  Laterality: N/A;   TEE WITHOUT CARDIOVERSION N/A 09/16/2020   Procedure: TRANSESOPHAGEAL ECHOCARDIOGRAM (TEE);  Surgeon: Jodelle Red, MD;  Location: [facility] ENDOSCOPY;  Service: Cardiovascular;  Laterality: N/A;   TEE WITHOUT CARDIOVERSION N/A 11/06/2020   Procedure: TRANSESOPHAGEAL ECHOCARDIOGRAM (TEE);  Surgeon: Tonny Bollman, MD;  Location: [facility] INVASIVE CV LAB;  Service: Cardiovascular;  Laterality: N/A;   TEE WITHOUT CARDIOVERSION N/A 06/16/2022   Procedure: TRANSESOPHAGEAL  ECHOCARDIOGRAM (TEE);  Surgeon: Thurmon Fair, MD;  Location: [facility] ENDOSCOPY;  Service: Cardiovascular;  Laterality: N/A;   TEE WITHOUT CARDIOVERSION N/A 03/02/2023   Procedure: TRANSESOPHAGEAL ECHOCARDIOGRAM;  Surgeon: Tonny Bollman, MD;  Location: [facility] INVASIVE CV LAB;  Service: Cardiovascular;  Laterality: N/A;   TONSILLECTOMY     age 23   TOTAL KNEE ARTHROPLASTY Left 06/02/2021   Procedure: LEFT TOTAL KNEE ARTHROPLASTY;  Surgeon: Sheral Apley, MD;  Location: [facility] ORS;  Service: Orthopedics;  Laterality: Left;   TRANSTHORACIC ECHOCARDIOGRAM  07/03/2020; 10/28/20; 12/03/20   06/2020 TTE EF 60-65%, grd II DD, severe pulm art HTN, severe mitral valve regurg, mod/sev tricuspic valve regurg. Conf on TEE 08/2020.  10/28/20 (s/p MV clip procedure) mild-mod MVR, EF 55-60%. 11/2020 EF 60%, mild/mod MR w/mean grad 4mm hg and mod to sev TR.   TRANSTHORACIC ECHOCARDIOGRAM     10/2022 EF 60-65%, nl LV fxn, grd II DD, severe MV clip regurg, severe pulm HTN.  02/01/23 (post MV clip#2: EF 60-65%, nl RV and LV fxn, inc PA pressure, severe tricusp regurg, no signif MR.   TRANSTHORACIC ECHOCARDIOGRAM     04/20/23-->1 month s/p mTEER: EF 55-60%, s/p mTEER with mild residual MR with mean graident of 7 mm hg as well as severe TR, severe biatrial enlargement and severe pulm HTN. Iatrogenic ASD with mostly left to right shunting.    Outpatient Medications Prior to Visit  Medication Sig Dispense Refill   Ascorbic Acid (VITAMIN C) 1000 MG tablet Take 1,000 mg by mouth daily.     aspirin 81 MG chewable tablet Chew 1 tablet (81 mg total) by mouth daily.     Calcium Carb-Cholecalciferol (CALCIUM 600 + D PO) Take 1 tablet by mouth daily.     hydrALAZINE (APRESOLINE) 25 MG tablet Take 1 tablet (25 mg total) by mouth as needed (IF BLOOD PRESSURE IS 180/90 OR GREATER). IF BLOOD PRESSURE IS 180/90 OR GREATER 90 tablet 3   irbesartan (AVAPRO) 300 MG tablet Take 1 tablet (300 mg total) by mouth daily. 90 tablet 3   lisinopril (ZESTRIL)  20 MG tablet Take 20 mg by mouth daily.     Multiple Vitamin (MULTIVITAMIN WITH MINERALS) TABS tablet Take 1 tablet by mouth in the morning.     Multiple Vitamins-Minerals (PRESERVISION AREDS PO) Take 1 tablet by mouth in the morning and at bedtime.      nitroGLYCERIN (NITRODUR - DOSED IN MG/24 HR) 0.2 mg/hr patch 1/2 patch over L Achilles tendon bid (Patient taking differently: Place 0.2 mg onto the skin daily as needed (blood circulation). On left foot) 30 patch 12   omeprazole (PRILOSEC) 20 MG capsule Take 20 mg by mouth daily.     potassium chloride (KLOR-CON) 10 MEQ tablet Take 10 mEq by mouth daily as needed (with furosemide (fluid  retention)).     sodium chloride (MURO 128) 5 % ophthalmic ointment Place 1 Application into both eyes at bedtime as needed (dry eyes).     vitamin E 180 MG (400 UNITS) capsule Take 400 Units by mouth in the morning.     amoxicillin (AMOXIL) 500 MG capsule SMARTSIG:4 Capsule(s) By Mouth (Patient not taking: Reported on 05/12/2023)     cyanocobalamin (VITAMIN B12) 1000 MCG tablet Take 1,000 mcg by mouth daily.     furosemide (LASIX) 20 MG tablet Take 1 tablet (20 mg total) by mouth daily as needed for edema.     azelastine (ASTELIN) 0.1 % nasal spray Place 2 sprays into both nostrils 2 (two) times daily. (Patient taking differently: Place 2 sprays into both nostrils daily as needed for allergies.) 30 mL 12   carvedilol (COREG) 25 MG tablet Take 1 tablet (25 mg total) by mouth in the morning and at bedtime. 180 tablet 1   ibandronate (BONIVA) 150 MG tablet Take 1 tablet (150 mg total) by mouth every 30 (thirty) days. Take in the morning with a full glass of water, on an empty stomach, and do not take anything else by mouth or lie down for the next 30 min. (Patient not taking: Reported on 04/27/2023) 3 tablet 3   No facility-administered medications prior to visit.    Allergies  Allergen Reactions   Advil [Ibuprofen]     Bleeds excessively    Barbiturates Nausea And  Vomiting   Demerol Nausea And Vomiting   Doxycycline Nausea Only   Hydrochlorothiazide Other (See Comments)    unknown   Irbesartan     HA   Losartan Potassium     hair loss   Olmesartan Medoxomil     hair loss   Telmisartan     cramps    Review of Systems As per HPI  PE:    05/12/2023    9:32 AM 04/27/2023   10:45 AM 04/20/2023    3:06 PM  Vitals with BMI  Height  5\' 3"  5\' 3"   Weight 118 lbs 113 lbs 6 oz 115 lbs 13 oz  BMI 20.91 20.09 20.52  Systolic 144 134 829  Diastolic 78 52 98  Pulse 62 61 163    Physical Exam  Gen: Alert, well appearing.  Patient is oriented to person, place, time, and situation. AFFECT: pleasant, lucid thought and speech. R SI joint mildly TTP.  Minimal R lumbar soft tissue TTP.   LABS:  Last CBC Lab Results  Component Value Date   WBC 9.5 03/03/2023   HGB 12.1 03/03/2023   HCT 39.0 03/03/2023   MCV 90.7 03/03/2023   MCH 28.1 03/03/2023   RDW 14.8 03/03/2023   PLT 225 03/03/2023   Last metabolic panel Lab Results  Component Value Date   GLUCOSE 133 (H) 03/03/2023   NA 137 03/03/2023   K 4.3 03/03/2023   CL 105 03/03/2023   CO2 25 03/03/2023   BUN 20 03/03/2023   CREATININE 0.65 03/03/2023   GFRNONAA >60 03/03/2023   CALCIUM 7.9 (L) 03/03/2023   PROT 6.3 (L) 02/28/2023   ALBUMIN 3.6 02/28/2023   BILITOT 0.8 02/28/2023   ALKPHOS 56 02/28/2023   AST 20 02/28/2023   ALT 11 02/28/2023   ANIONGAP 7 03/03/2023   Last lipids Lab Results  Component Value Date   CHOL 139 07/02/2020   HDL 80.30 07/02/2020   LDLCALC 53 07/02/2020   TRIG 30.0 07/02/2020   CHOLHDL 2 07/02/2020  Last thyroid functions Lab Results  Component Value Date   TSH 2.37 07/02/2020   Last vitamin B12 and Folate Lab Results  Component Value Date   VITAMINB12 923 (H) 10/09/2015    IMPRESSION AND PLAN:  #1 uncontrolled hypertension.  Her main issue is her nighttime elevations.  Morning and midday seem to be low normal and she has even had 1 in  the 80s over 40s in the early morning. Will continue irbesartan 300 mg every morning.  Continue Coreg 25 mg in the morning and restart the evening 25 mg dose as well. She does not take the hydralazine right now so I recommended she take 2 of the 25 mg tabs every evening at supper.  #2 chronic right low back pain.  Recent SI joint pain on the right. Oxycodone is mildly helpful but causes nausea for her. I prescribed Vicodin 5/325 today, 1-2 twice daily as needed, #30. She has gait instability due to her pain--referral to physical therapy ordered today.  #3 osteoporosis.  She has been on Boniva for approximately 21 months.  She is on calcium and vitamin D supplement. We will go ahead and repeat DEXA.  Check vitamin D level as well.  An After Visit Summary was printed and given to the patient.  FOLLOW UP: Return in about 2 weeks (around 05/26/2023) for f/u HTN and SI pain.  Signed:  Santiago Bumpers, MD           05/12/2023

## 2023-05-13 ENCOUNTER — Encounter (INDEPENDENT_AMBULATORY_CARE_PROVIDER_SITE_OTHER): Payer: Medicare Other | Admitting: Ophthalmology

## 2023-05-17 NOTE — Progress Notes (Signed)
Pt given results/recommendations.

## 2023-05-25 NOTE — Patient Instructions (Incomplete)
It was very nice to see you today!   PLEASE NOTE:   If you had any lab tests please let us know if you have not heard back within a few days. You may see your results on MyChart before we have a chance to review them but we will give you a call once they are reviewed by us. If we ordered any referrals today, please let us know if you have not heard from their office within the next 2 weeks. You should receive a letter via MyChart confirming if the referral was approved and their office contact information to schedule.  

## 2023-05-26 ENCOUNTER — Ambulatory Visit (INDEPENDENT_AMBULATORY_CARE_PROVIDER_SITE_OTHER): Payer: Medicare Other | Admitting: Family Medicine

## 2023-05-26 ENCOUNTER — Encounter: Payer: Self-pay | Admitting: Family Medicine

## 2023-05-26 VITALS — BP 158/72 | HR 65 | Temp 98.8°F | Wt 122.4 lb

## 2023-05-26 DIAGNOSIS — I10 Essential (primary) hypertension: Secondary | ICD-10-CM | POA: Diagnosis not present

## 2023-05-26 LAB — BASIC METABOLIC PANEL
BUN: 23 mg/dL (ref 6–23)
CO2: 30 mEq/L (ref 19–32)
Calcium: 8.7 mg/dL (ref 8.4–10.5)
Chloride: 103 mEq/L (ref 96–112)
Creatinine, Ser: 0.61 mg/dL (ref 0.40–1.20)
GFR: 81.05 mL/min (ref >60.00)
Glucose, Bld: 94 mg/dL (ref 70–99)
Potassium: 4.5 mEq/L (ref 3.5–5.1)
Sodium: 140 mEq/L (ref 135–145)

## 2023-05-26 MED ORDER — HYDRALAZINE HCL 25 MG PO TABS: ORAL_TABLET | ORAL | Status: DC

## 2023-05-26 MED ORDER — IRBESARTAN 300 MG PO TABS: ORAL_TABLET | ORAL | Status: DC

## 2023-05-26 MED ORDER — FUROSEMIDE 20 MG PO TABS: ORAL_TABLET | ORAL | 1 refills | Status: DC

## 2023-05-26 NOTE — Progress Notes (Signed)
OFFICE VISIT  05/26/2023  CC:  Chief Complaint  Patient presents with   Follow-up    2 week follow up on BP and pain    Patient is a 88 y.o. female who presents for 2-week follow-up uncontrolled hypertension and recent SI joint pain superimposed on her chronic right low back pain. A/P as of last visit: "#1 uncontrolled hypertension.  Her main issue is her nighttime elevations.  Morning and midday seem to be low normal and she has even had 1 in the 80s over 40s in the early morning. Will continue irbesartan 300 mg every morning.  Continue Coreg 25 mg in the morning and restart the evening 25 mg dose as well. She does not take the hydralazine right now so I recommended she take 2 of the 25 mg tabs every evening at supper.   #2 chronic right low back pain.  Recent SI joint pain on the right. Oxycodone is mildly helpful but causes nausea for her. I prescribed Vicodin 5/325 today, 1-2 twice daily as needed, #30. She has gait instability due to her pain--referral to physical therapy ordered today.   #3 osteoporosis.  She has been on Boniva for approximately 21 months.  She is on calcium and vitamin D supplement. We will go ahead and repeat DEXA.  Check vitamin D level as well."  INTERIM HX: Her back pain is feeling better.  She just has a little bit of right gluteal pain that is not bothering her all that much.  Home blood pressures for the most part are high in the systolics anywhere from 140s to 170s.  Occasionally though it is down into the 80-100 range systolic. Diastolics consistently 60s to 70s. No dizziness or acute fatigue.  She is worried because she does not urinate very much.  No urinary urgency, frequency, or burning with urination. No gross hematuria. She drinks 2 cups of coffee every day but says otherwise not much liquids.  She has gained several pounds according to our scales since last visit.  Feels a little bit of increased abdominal girth.  Felt like the lower legs were  little more swollen recently so she took a Lasix pill once.  She does not take this pill very often at all. No shortness of breath.  ROS as above, plus--> no fevers, no CP,no wheezing, no cough, no HAs, no rashes, no melena/hematochezia.  No polyuria or polydipsia.  No myalgias or arthralgias.  No focal weakness, paresthesias, or tremors.  No acute vision or hearing abnormalities.    No n/v/d or abd pain.  No palpitations.      Past Medical History:  Diagnosis Date   Achilles tendon rupture    left, no surgery required   Chronic diastolic heart failure (HCC)    Gross hematuria 12/2020   Klebsiella on urine clx x2 but no UTI sx's.  CT showed tiny stones in each renal pelvis but no ureteral or bladder stones, no mass.  Urol did cysto->normal. Plan is annual UA, rpt hematuria w/u in 3-5 yrs if persistent pos.   History of kidney stones    Hypertension    Hypertensive retinopathy of both eyes    Dr. Elmer Picker   Low back pain    spondylosis, listhesis, +scoliosis at L/S jxn   Lower extremity edema    lasix prn   OA (osteoarthritis)    Osteopenia    Osteoporosis 07/2021   07/2021 T score -4.6   S/P mitral valve clip implantation 11/06/2020   s/p  TEER with one XTW MitraClip on A2P2 by Dr. Excell Seltzer.  DAPT w/ASA and plavix x 3 mo post procedure recommended.   Severe mitral regurgitation    Mitral valve clip 10/2020.  Clip #25 January 2023   Severe pulmonary hypertension (HCC) 2021   transth echo; moderate by TEE 08/2020   Systolic murmur 07/03/2020   severe mitral regurge, mod/severe tricuspic regurg   Tremor of right hand     Past Surgical History:  Procedure Laterality Date   APPENDECTOMY     88 yrs old   BACK SURGERY  2004   ruptured disc   CHOLECYSTECTOMY     age 41   CYSTOSCOPY  02/24/2021   (for gross hematuria) ->normal.   DEXA  07/2021   2022 T score -4.6 (radius)   LUMBAR LAMINECTOMY  2004   MITRAL VALVE REPAIR N/A 11/06/2020   Procedure: MITRAL VALVE REPAIR;  Surgeon:  Tonny Bollman, MD;  Location: [facility] INVASIVE CV LAB;  Service: Cardiovascular;  Laterality: N/A;   MITRAL VALVE REPAIR N/A 03/02/2023   Procedure: MITRAL VALVE REPAIR;  Surgeon: Tonny Bollman, MD;  Location: [facility] INVASIVE CV LAB;  Service: Cardiovascular;  Laterality: N/A;   RIGHT/LEFT HEART CATH AND CORONARY ANGIOGRAPHY N/A 09/16/2020   No CAD. Procedure: RIGHT/LEFT HEART CATH AND CORONARY ANGIOGRAPHY;  Surgeon: Lyn Records, MD;  Location: [facility] INVASIVE CV LAB;  Service: Cardiovascular;  Laterality: N/A;   TEE WITHOUT CARDIOVERSION N/A 09/16/2020   Procedure: TRANSESOPHAGEAL ECHOCARDIOGRAM (TEE);  Surgeon: Jodelle Red, MD;  Location: [facility] ENDOSCOPY;  Service: Cardiovascular;  Laterality: N/A;   TEE WITHOUT CARDIOVERSION N/A 11/06/2020   Procedure: TRANSESOPHAGEAL ECHOCARDIOGRAM (TEE);  Surgeon: Tonny Bollman, MD;  Location: [facility] INVASIVE CV LAB;  Service: Cardiovascular;  Laterality: N/A;   TEE WITHOUT CARDIOVERSION N/A 06/16/2022   Procedure: TRANSESOPHAGEAL ECHOCARDIOGRAM (TEE);  Surgeon: Thurmon Fair, MD;  Location: [facility] ENDOSCOPY;  Service: Cardiovascular;  Laterality: N/A;   TEE WITHOUT CARDIOVERSION N/A 03/02/2023   Procedure: TRANSESOPHAGEAL ECHOCARDIOGRAM;  Surgeon: Tonny Bollman, MD;  Location: [facility] INVASIVE CV LAB;  Service: Cardiovascular;  Laterality: N/A;   TONSILLECTOMY     age 79   TOTAL KNEE ARTHROPLASTY Left 06/02/2021   Procedure: LEFT TOTAL KNEE ARTHROPLASTY;  Surgeon: Sheral Apley, MD;  Location: [facility] ORS;  Service: Orthopedics;  Laterality: Left;   TRANSTHORACIC ECHOCARDIOGRAM  07/03/2020; 10/28/20; 12/03/20   06/2020 TTE EF 60-65%, grd II DD, severe pulm art HTN, severe mitral valve regurg, mod/sev tricuspic valve regurg. Conf on TEE 08/2020.  10/28/20 (s/p MV clip procedure) mild-mod MVR, EF 55-60%. 11/2020 EF 60%, mild/mod MR w/mean grad 4mm hg and mod to sev TR.   TRANSTHORACIC ECHOCARDIOGRAM     10/2022 EF 60-65%, nl LV fxn, grd II DD, severe MV clip regurg, severe  pulm HTN.  02/01/23 (post MV clip#2: EF 60-65%, nl RV and LV fxn, inc PA pressure, severe tricusp regurg, no signif MR.   TRANSTHORACIC ECHOCARDIOGRAM     04/20/23-->1 month s/p mTEER: EF 55-60%, s/p mTEER with mild residual MR with mean graident of 7 mm hg as well as severe TR, severe biatrial enlargement and severe pulm HTN. Iatrogenic ASD with mostly left to right shunting.    Outpatient Medications Prior to Visit  Medication Sig Dispense Refill   Ascorbic Acid (VITAMIN C) 1000 MG tablet Take 1,000 mg by mouth daily.     aspirin 81 MG chewable tablet Chew 1 tablet (81 mg total) by mouth daily.     azelastine (ASTELIN) 0.1 % nasal  spray Place 2 sprays into both nostrils 2 (two) times daily. 30 mL 12   Calcium Carb-Cholecalciferol (CALCIUM 600 + D PO) Take 1 tablet by mouth daily.     carvedilol (COREG) 25 MG tablet Take 1 tablet (25 mg total) by mouth in the morning and at bedtime. 180 tablet 1   cyanocobalamin (VITAMIN B12) 1000 MCG tablet Take 1,000 mcg by mouth daily.     HYDROcodone-acetaminophen (NORCO/VICODIN) 5-325 MG tablet 1-2 tabs po bid prn pain 30 tablet 0   ibandronate (BONIVA) 150 MG tablet Take 1 tablet (150 mg total) by mouth every 30 (thirty) days. Take in the morning with a full glass of water, on an empty stomach, and do not take anything else by mouth or lie down for the next 30 min. 3 tablet 3   Multiple Vitamin (MULTIVITAMIN WITH MINERALS) TABS tablet Take 1 tablet by mouth in the morning.     Multiple Vitamins-Minerals (PRESERVISION AREDS PO) Take 1 tablet by mouth in the morning and at bedtime.      nitroGLYCERIN (NITRODUR - DOSED IN MG/24 HR) 0.2 mg/hr patch 1/2 patch over L Achilles tendon bid (Patient taking differently: Place 0.2 mg onto the skin daily as needed (blood circulation). On left foot) 30 patch 12   omeprazole (PRILOSEC) 20 MG capsule Take 20 mg by mouth daily.     potassium chloride (KLOR-CON) 10 MEQ tablet Take 10 mEq by mouth daily as needed (with  furosemide (fluid retention)).     sodium chloride (MURO 128) 5 % ophthalmic ointment Place 1 Application into both eyes at bedtime as needed (dry eyes).     vitamin E 180 MG (400 UNITS) capsule Take 400 Units by mouth in the morning.     furosemide (LASIX) 20 MG tablet Take 1 tablet (20 mg total) by mouth daily as needed for edema.     hydrALAZINE (APRESOLINE) 25 MG tablet Take 1 tablet (25 mg total) by mouth as needed (IF BLOOD PRESSURE IS 180/90 OR GREATER). IF BLOOD PRESSURE IS 180/90 OR GREATER 90 tablet 3   irbesartan (AVAPRO) 300 MG tablet Take 1 tablet (300 mg total) by mouth daily. 90 tablet 3   lisinopril (ZESTRIL) 20 MG tablet Take 20 mg by mouth daily.     amoxicillin (AMOXIL) 500 MG capsule SMARTSIG:4 Capsule(s) By Mouth (Patient not taking: Reported on 05/12/2023)     No facility-administered medications prior to visit.    Allergies  Allergen Reactions   Advil [Ibuprofen]     Bleeds excessively    Barbiturates Nausea And Vomiting   Demerol Nausea And Vomiting   Doxycycline Nausea Only   Hydrochlorothiazide Other (See Comments)    unknown   Irbesartan     HA   Losartan Potassium     hair loss   Olmesartan Medoxomil     hair loss   Telmisartan     cramps    Review of Systems As per HPI  PE:    05/26/2023   10:25 AM 05/26/2023   10:19 AM 05/12/2023    9:32 AM  Vitals with BMI  Weight  122 lbs 6 oz 118 lbs  BMI   20.91  Systolic 158 174 387  Diastolic 72 77 78  Pulse  65 62  Initial bp today 172/77 Rpt manual 158/72 P65   Physical Exam  Gen: Alert, well appearing.  Patient is oriented to person, place, time, and situation. AFFECT: pleasant, lucid thought and speech. FIE:PPIR: no injection, icteris, swelling, or  exudate.  EOMI, PERRLA. Mouth: lips without lesion/swelling.  Oral mucosa pink and moist. Oropharynx without erythema, exudate, or swelling.  CV: RRR, no m/r/g.   LUNGS: CTA bilat, nonlabored resps, good aeration in all lung fields. EXT: no  clubbing or cyanosis.  no edema.   LABS:  Last CBC Lab Results  Component Value Date   WBC 9.5 03/03/2023   HGB 12.1 03/03/2023   HCT 39.0 03/03/2023   MCV 90.7 03/03/2023   MCH 28.1 03/03/2023   RDW 14.8 03/03/2023   PLT 225 03/03/2023   Last metabolic panel Lab Results  Component Value Date   GLUCOSE 133 (H) 03/03/2023   NA 137 03/03/2023   K 4.3 03/03/2023   CL 105 03/03/2023   CO2 25 03/03/2023   BUN 20 03/03/2023   CREATININE 0.65 03/03/2023   GFRNONAA >60 03/03/2023   CALCIUM 7.9 (L) 03/03/2023   PROT 6.3 (L) 02/28/2023   ALBUMIN 3.6 02/28/2023   BILITOT 0.8 02/28/2023   ALKPHOS 56 02/28/2023   AST 20 02/28/2023   ALT 11 02/28/2023   ANIONGAP 7 03/03/2023   Last lipids Lab Results  Component Value Date   CHOL 139 07/02/2020   HDL 80.30 07/02/2020   LDLCALC 53 07/02/2020   TRIG 30.0 07/02/2020   CHOLHDL 2 07/02/2020   Last thyroid functions Lab Results  Component Value Date   TSH 2.37 07/02/2020   Last vitamin D Lab Results  Component Value Date   VD25OH 29.48 (L) 05/12/2023   Last vitamin B12 and Folate Lab Results  Component Value Date   VITAMINB12 923 (H) 10/09/2015   IMPRESSION AND PLAN:  #1 hypertension, labile.  Not well-controlled for the most part. We discussed the goal of permissive hypertension today. She will start taking her irbesartan 300 mg in the evening with her second dose of Coreg.  Additionally, she will continue with 50 mg of hydralazine at that time.  #2 chronic diastolic congestive heart failure, pulmonary hypertension, severe tricuspid regurg. She states that her daily weights at home are consistently around 115. There is a little variation on our scales, though, with recent 4 pound weight increase in the last 2 weeks. I told her to go ahead and take her Lasix on a scheduled basis 2 days/week. Basic metabolic panel today.  An After Visit Summary was printed and given to the patient.  FOLLOW UP: Return in about 3  months (around 08/26/2023) for routine chronic illness f/u.  Signed:  Santiago Bumpers, MD           05/26/2023

## 2023-06-15 ENCOUNTER — Encounter (INDEPENDENT_AMBULATORY_CARE_PROVIDER_SITE_OTHER): Payer: Medicare Other | Admitting: Ophthalmology

## 2023-06-15 DIAGNOSIS — H35033 Hypertensive retinopathy, bilateral: Secondary | ICD-10-CM

## 2023-06-15 DIAGNOSIS — I10 Essential (primary) hypertension: Secondary | ICD-10-CM

## 2023-06-15 DIAGNOSIS — H348312 Tributary (branch) retinal vein occlusion, right eye, stable: Secondary | ICD-10-CM | POA: Diagnosis not present

## 2023-06-15 DIAGNOSIS — H43813 Vitreous degeneration, bilateral: Secondary | ICD-10-CM

## 2023-07-14 ENCOUNTER — Ambulatory Visit (INDEPENDENT_AMBULATORY_CARE_PROVIDER_SITE_OTHER): Payer: Medicare Other | Admitting: Family Medicine

## 2023-07-14 ENCOUNTER — Telehealth: Payer: Self-pay

## 2023-07-14 ENCOUNTER — Encounter: Payer: Self-pay | Admitting: Family Medicine

## 2023-07-14 VITALS — BP 193/76 | HR 66 | Temp 97.8°F | Wt 120.4 lb

## 2023-07-14 DIAGNOSIS — G8929 Other chronic pain: Secondary | ICD-10-CM

## 2023-07-14 DIAGNOSIS — R0989 Other specified symptoms and signs involving the circulatory and respiratory systems: Secondary | ICD-10-CM | POA: Diagnosis not present

## 2023-07-14 DIAGNOSIS — M533 Sacrococcygeal disorders, not elsewhere classified: Secondary | ICD-10-CM | POA: Diagnosis not present

## 2023-07-14 MED ORDER — HYDROCODONE-ACETAMINOPHEN 5-325 MG PO TABS: ORAL_TABLET | ORAL | 0 refills | Status: DC

## 2023-07-14 MED ORDER — HYDRALAZINE HCL 25 MG PO TABS: ORAL_TABLET | ORAL | Status: AC

## 2023-07-14 MED ORDER — GABAPENTIN 100 MG PO CAPS: 100.0000 mg | ORAL_CAPSULE | Freq: Three times a day (TID) | ORAL | 3 refills | Status: DC

## 2023-07-14 MED ORDER — AMLODIPINE BESYLATE 2.5 MG PO TABS: ORAL_TABLET | ORAL | 0 refills | Status: DC

## 2023-07-14 NOTE — Patient Instructions (Addendum)
Please bring all medications to next appointment.  Throw away the lisinopril.  I have sent in a new prescription for amlodipine.  You will take this twice a day. Take irbesartan 300 mg once a day. Take carvedilol 25 mg twice a day.

## 2023-07-14 NOTE — Telephone Encounter (Signed)
Pt has appt scheduled with PCP today, 3pm  [facility] Day - Client TELEPHONE ADVICE RECORD [facility] Patient Name First: Erin [name] Last: [facility] Gender: Female DOB: 11-Oct-1935 Age: 88 Y 4 M 15 D Return Phone Number: [phone] (Primary), [phone] (Secondary) Address: City/ State/ Zip: [place]  [address] Client [facility] Day - Client Client Site [facility] - Day Provider Santiago Bumpers - MD Contact Type Call Who Is Calling Patient / Member / Family / Caregiver Call Type Triage / Clinical Relationship To Patient Self Return Phone Number [phone] (Primary) Chief Complaint BREATHING - shortness of breath or sounds breathless Reason for Call Symptomatic / Request for Health Information Initial Comment Caller states the pt. has a BP that is high. 189/103 and shortness of breath. Translation No Nurse Assessment Nurse: Shanna Cisco, RN, Gavin Pound Date/Time Lamount Cohen Time): 07/11/2023 9:16:00 AM Confirm and document reason for call. If symptomatic, describe symptoms. ---Caller states she's having high B/P 189/103 and SOB. Caller states B/P reading is before taking medication. Does the patient have any new or worsening symptoms? ---Yes Will a triage be completed? ---Yes Related visit to physician within the last 2 weeks? ---No Does the PT have any chronic conditions? (i.e. diabetes, asthma, this includes High risk factors for pregnancy, etc.) ---Yes List chronic conditions. ---HTN Heart problems Is this a behavioral health or substance abuse call? ---No Guidelines Guideline Title Affirmed Question Affirmed Notes Nurse Date/Time (Eastern Time) Blood Pressure - High [1] Systolic BP >= 160 OR Diastolic >= 100 AND [2] cardiac (e.g., breathing difficulty, chest pain) or neurologic symptoms (e.g., new-onset blurred Shanna Cisco, RN, Gavin Pound 07/11/2023 9:20:58 AM PLEASE NOTE: All timestamps  contained within this report are represented as [place] Standard Time. CONFIDENTIALTY NOTICE: This fax transmission is intended only for the addressee. It contains information that is legally privileged, confidential or otherwise protected from use or disclosure. If you are not the intended recipient, you are strictly prohibited from reviewing, disclosing, copying using or disseminating any of this information or taking any action in reliance on or regarding this information. If you have received this fax in error, please notify us immediately by telephone so that we can arrange for its return to [place]. Phone: [phone], Toll-Free: [phone], Fax: [phone] Page: 2 of 2 Call Id: [id number] Guidelines Guideline Title Affirmed Question Affirmed Notes Nurse Date/Time Lamount Cohen Time) or double vision, unsteady gait) Disp. Time Lamount Cohen Time) Disposition Final User 07/11/2023 9:11:55 AM Send to Urgent Queue Day, Lelon Mast 07/11/2023 9:24:09 AM Go to ED Now Yes Shanna Cisco, RN, Deborah Final Disposition 07/11/2023 9:24:09 AM Go to ED Now Yes Shanna Cisco, RN, Jetty Duhamel Disagree/Comply Comply Caller Understands Yes PreDisposition Call Doctor Care Advice Given Per Guideline GO TO ED NOW: * You need to be seen in the Emergency Department. * Leave now. Drive carefully. * Go to the ED at ___________ Hospital. NOTE TO TRIAGER - DRIVING: * Another adult should drive. * Patient should not delay going to the emergency department. CARE ADVICE given per Blood Pressure - High (Adult) guideline. * You become worse * Becomes too weak to stand * Becomes confused * Passes out or faints CALL EMS 911 IF: Referrals [facility] - ED

## 2023-07-14 NOTE — Progress Notes (Signed)
OFFICE VISIT  07/14/2023  CC:  Chief Complaint  Patient presents with   Hypertension    AM meds taken- carvedilol and lisinopril    Patient is a 88 y.o. female who presents for elevated blood pressure and right hip/buttock pain. I last saw her 05/26/23. A/P as of that visit: "#1 hypertension, labile.  Not well-controlled for the most part. We discussed the goal of permissive hypertension today. She will start taking her irbesartan 300 mg in the evening with her second dose of Coreg.  Additionally, she will continue with 50 mg of hydralazine at that time.   #2 chronic diastolic congestive heart failure, pulmonary hypertension, severe tricuspid regurg. She states that her daily weights at home are consistently around 115. There is a little variation on our scales, though, with recent 4 pound weight increase in the last 2 weeks. I told her to go ahead and take her Lasix on a scheduled basis 2 days/week. Basic metabolic panel today."  HPI: She has her blood pressure medication a little messed up. She decided to self discontinue her irbesartan because she felt like it was not working.  She had lisinopril at home and started taking a 20 mg tab in its place. She has continued Coreg 25 mg twice a day.  She no longer takes hydralazine because it drops her blood pressure too low most of the time. She reports that most of her blood pressures are in the 160-190 systolic range and in the 80s to 100s diastolic range.  Occasionally she will have 1 down into the 80s or 90s systolic, usually after she has taken hydralazine.  She feels like gabapentin 300 mg dose (which is not on her med list at this time) makes her drowsy so she has only been taking it around bedtime whereas she had been taking it 3 times daily. She would like to go down to the 100 mg 3 times daily dosing.  She does say that it has helped with her bilateral lower extremity pain.  She does have chronic low back pain and persistent pain  lower around the right SI joint. Pain is worse with standing up and moving around.  Reports her pain is typically 3-4 out of 10 intensity but sometimes higher and requires a 1/2-1 Vicodin tab and this does help some. Usually reserve this treatment for bedtime.   ROS as above, plus--> no fevers, no CP, no SOB, no wheezing, no cough, no dizziness, no HAs, no rashes, no melena/hematochezia.  No polyuria or polydipsia.  No focal weakness, paresthesias, or tremors.  No acute vision or hearing abnormalities.  No lower extremity swelling. No n/v/d or abd pain.  No palpitations.     Past Medical History:  Diagnosis Date   Achilles tendon rupture    left, no surgery required   Chronic diastolic heart failure (HCC)    Gross hematuria 12/2020   Klebsiella on urine clx x2 but no UTI sx's.  CT showed tiny stones in each renal pelvis but no ureteral or bladder stones, no mass.  Urol did cysto->normal. Plan is annual UA, rpt hematuria w/u in 3-5 yrs if persistent pos.   History of kidney stones    Hypertension    Hypertensive retinopathy of both eyes    Dr. Elmer Picker   Low back pain    spondylosis, listhesis, +scoliosis at L/S jxn   Lower extremity edema    lasix prn   OA (osteoarthritis)    Osteopenia    Osteoporosis 07/2021  07/2021 T score -4.6   S/P mitral valve clip implantation 11/06/2020   s/p TEER with one XTW MitraClip on A2P2 by Dr. Excell Seltzer.  DAPT w/ASA and plavix x 3 mo post procedure recommended.   Severe mitral regurgitation    Mitral valve clip 10/2020.  Clip #25 January 2023   Severe pulmonary hypertension (HCC) 2021   transth echo; moderate by TEE 08/2020   Systolic murmur 07/03/2020   severe mitral regurge, mod/severe tricuspic regurg   Tremor of right hand     Past Surgical History:  Procedure Laterality Date   APPENDECTOMY     88 yrs old   BACK SURGERY  2004   ruptured disc   CHOLECYSTECTOMY     age 60   CYSTOSCOPY  02/24/2021   (for gross hematuria) ->normal.   DEXA   07/2021   2022 T score -4.6 (radius)   LUMBAR LAMINECTOMY  2004   MITRAL VALVE REPAIR N/A 11/06/2020   Procedure: MITRAL VALVE REPAIR;  Surgeon: Tonny Bollman, MD;  Location: [facility] INVASIVE CV LAB;  Service: Cardiovascular;  Laterality: N/A;   MITRAL VALVE REPAIR N/A 03/02/2023   Procedure: MITRAL VALVE REPAIR;  Surgeon: Tonny Bollman, MD;  Location: [facility] INVASIVE CV LAB;  Service: Cardiovascular;  Laterality: N/A;   RIGHT/LEFT HEART CATH AND CORONARY ANGIOGRAPHY N/A 09/16/2020   No CAD. Procedure: RIGHT/LEFT HEART CATH AND CORONARY ANGIOGRAPHY;  Surgeon: Lyn Records, MD;  Location: [facility] INVASIVE CV LAB;  Service: Cardiovascular;  Laterality: N/A;   TEE WITHOUT CARDIOVERSION N/A 09/16/2020   Procedure: TRANSESOPHAGEAL ECHOCARDIOGRAM (TEE);  Surgeon: Jodelle Red, MD;  Location: [facility] ENDOSCOPY;  Service: Cardiovascular;  Laterality: N/A;   TEE WITHOUT CARDIOVERSION N/A 11/06/2020   Procedure: TRANSESOPHAGEAL ECHOCARDIOGRAM (TEE);  Surgeon: Tonny Bollman, MD;  Location: [facility] INVASIVE CV LAB;  Service: Cardiovascular;  Laterality: N/A;   TEE WITHOUT CARDIOVERSION N/A 06/16/2022   Procedure: TRANSESOPHAGEAL ECHOCARDIOGRAM (TEE);  Surgeon: Thurmon Fair, MD;  Location: [facility] ENDOSCOPY;  Service: Cardiovascular;  Laterality: N/A;   TEE WITHOUT CARDIOVERSION N/A 03/02/2023   Procedure: TRANSESOPHAGEAL ECHOCARDIOGRAM;  Surgeon: Tonny Bollman, MD;  Location: [facility] INVASIVE CV LAB;  Service: Cardiovascular;  Laterality: N/A;   TONSILLECTOMY     age 43   TOTAL KNEE ARTHROPLASTY Left 06/02/2021   Procedure: LEFT TOTAL KNEE ARTHROPLASTY;  Surgeon: Sheral Apley, MD;  Location: [facility] ORS;  Service: Orthopedics;  Laterality: Left;   TRANSTHORACIC ECHOCARDIOGRAM  07/03/2020; 10/28/20; 12/03/20   06/2020 TTE EF 60-65%, grd II DD, severe pulm art HTN, severe mitral valve regurg, mod/sev tricuspic valve regurg. Conf on TEE 08/2020.  10/28/20 (s/p MV clip procedure) mild-mod MVR, EF 55-60%. 11/2020 EF 60%, mild/mod  MR w/mean grad 4mm hg and mod to sev TR.   TRANSTHORACIC ECHOCARDIOGRAM     10/2022 EF 60-65%, nl LV fxn, grd II DD, severe MV clip regurg, severe pulm HTN.  02/01/23 (post MV clip#2: EF 60-65%, nl RV and LV fxn, inc PA pressure, severe tricusp regurg, no signif MR.   TRANSTHORACIC ECHOCARDIOGRAM     04/20/23-->1 month s/p mTEER: EF 55-60%, s/p mTEER with mild residual MR with mean graident of 7 mm hg as well as severe TR, severe biatrial enlargement and severe pulm HTN. Iatrogenic ASD with mostly left to right shunting.    Outpatient Medications Prior to Visit  Medication Sig Dispense Refill   Ascorbic Acid (VITAMIN C) 1000 MG tablet Take 1,000 mg by mouth daily.     aspirin 81 MG chewable tablet Chew 1 tablet (  81 mg total) by mouth daily.     azelastine (ASTELIN) 0.1 % nasal spray Place 2 sprays into both nostrils 2 (two) times daily. 30 mL 12   Calcium Carb-Cholecalciferol (CALCIUM 600 + D PO) Take 1 tablet by mouth daily.     carvedilol (COREG) 25 MG tablet Take 1 tablet (25 mg total) by mouth in the morning and at bedtime. 180 tablet 1   cyanocobalamin (VITAMIN B12) 1000 MCG tablet Take 1,000 mcg by mouth daily.     furosemide (LASIX) 20 MG tablet 1 tab po 2 days a week 180 tablet 1   hydrALAZINE (APRESOLINE) 25 MG tablet 2 tabs po every evening     HYDROcodone-acetaminophen (NORCO/VICODIN) 5-325 MG tablet 1-2 tabs po bid prn pain 30 tablet 0   ibandronate (BONIVA) 150 MG tablet Take 1 tablet (150 mg total) by mouth every 30 (thirty) days. Take in the morning with a full glass of water, on an empty stomach, and do not take anything else by mouth or lie down for the next 30 min. 3 tablet 3   irbesartan (AVAPRO) 300 MG tablet 1 tab every evening     lisinopril (ZESTRIL) 20 MG tablet Take 20 mg by mouth daily.     Multiple Vitamin (MULTIVITAMIN WITH MINERALS) TABS tablet Take 1 tablet by mouth in the morning.     Multiple Vitamins-Minerals (PRESERVISION AREDS PO) Take 1 tablet by mouth in the  morning and at bedtime.      nitroGLYCERIN (NITRODUR - DOSED IN MG/24 HR) 0.2 mg/hr patch 1/2 patch over L Achilles tendon bid (Patient taking differently: Place 0.2 mg onto the skin daily as needed (blood circulation). On left foot) 30 patch 12   omeprazole (PRILOSEC) 20 MG capsule Take 20 mg by mouth daily.     potassium chloride (KLOR-CON) 10 MEQ tablet Take 10 mEq by mouth daily as needed (with furosemide (fluid retention)).     sodium chloride (MURO 128) 5 % ophthalmic ointment Place 1 Application into both eyes at bedtime as needed (dry eyes).     vitamin E 180 MG (400 UNITS) capsule Take 400 Units by mouth in the morning.     amoxicillin (AMOXIL) 500 MG capsule SMARTSIG:4 Capsule(s) By Mouth (Patient not taking: Reported on 05/12/2023)     No facility-administered medications prior to visit.    Allergies  Allergen Reactions   Advil [Ibuprofen]     Bleeds excessively    Barbiturates Nausea And Vomiting   Demerol Nausea And Vomiting   Doxycycline Nausea Only   Hydrochlorothiazide Other (See Comments)    unknown   Irbesartan     HA   Losartan Potassium     hair loss   Olmesartan Medoxomil     hair loss   Telmisartan     cramps    Review of Systems  As per HPI  PE:    07/14/2023    3:13 PM 07/14/2023    3:05 PM 05/26/2023   10:25 AM  Vitals with BMI  Weight  120 lbs 6 oz   Systolic 193 191 119  Diastolic 76 79 72  Pulse  66      Physical Exam  Gen: Alert, well appearing.  Patient is oriented to person, place, time, and situation. AFFECT: pleasant, lucid thought and speech. She has focal tenderness to palpation over the right SI joint.  LABS:  Last CBC Lab Results  Component Value Date   WBC 9.5 03/03/2023   HGB 12.1 03/03/2023  HCT 39.0 03/03/2023   MCV 90.7 03/03/2023   MCH 28.1 03/03/2023   RDW 14.8 03/03/2023   PLT 225 03/03/2023   Last metabolic panel Lab Results  Component Value Date   GLUCOSE 94 05/26/2023   NA 140 05/26/2023   K 4.5  05/26/2023   CL 103 05/26/2023   CO2 30 05/26/2023   BUN 23 05/26/2023   CREATININE 0.61 05/26/2023   GFR 81.05 05/26/2023   CALCIUM 8.7 05/26/2023   PROT 6.3 (L) 02/28/2023   ALBUMIN 3.6 02/28/2023   BILITOT 0.8 02/28/2023   ALKPHOS 56 02/28/2023   AST 20 02/28/2023   ALT 11 02/28/2023   ANIONGAP 7 03/03/2023   IMPRESSION AND PLAN:  #1 labile hypertension. We discussed the need to comply with recommended regimen and if she feels like change needs to be made then to let us know.  Otherwise things get too confusing. She expressed understanding and agreement with this. At this point I recommended she return to taking Avapro 300 mg a day and continue Coreg 25 mg twice a day.  Today I started amlodipine 2.5 mg to take twice a day. She can reserve hydralazine 25 mg dose as needed for systolic blood pressure greater than 180. I recommended she dispose of her lisinopril in order to avoid future confusion.  2.  #2 chronic right SI joint pain. This has been a prominent problem for the last several months at least.  We discussed this a couple of months ago and the possibility of SI joint steroid injection for both diagnostic and therapeutic purposes. She will return next week for this procedure when we recheck her blood pressure.  #3 chronic low back pain. Spondylosis, spondylolisthesis. Remote history of lumbar laminectomy. In the more recent past she has had epidural steroid injections and states they did not help. She does have a neurosurgeon and has follow-up next month.  An After Visit Summary was printed and given to the patient.  FOLLOW UP: Return in about 2 weeks (around 07/28/2023) for HTN.  Signed:  Santiago Bumpers, MD           07/14/2023

## 2023-07-21 NOTE — Telephone Encounter (Signed)
Per [facility] pharmacy  Coreg- 07/19 90 d/s Irbesartan-07/03; one ready now 90 d/s Hydralazine-07/26; one ready now 90 d/s Lisinopril- 07/26; one ready now 90 d/s

## 2023-07-22 ENCOUNTER — Ambulatory Visit: Payer: Medicare Other | Admitting: Family Medicine

## 2023-07-25 ENCOUNTER — Ambulatory Visit: Payer: Medicare Other | Attending: Cardiovascular Disease | Admitting: Cardiovascular Disease

## 2023-07-25 ENCOUNTER — Encounter: Payer: Self-pay | Admitting: Cardiovascular Disease

## 2023-07-25 VITALS — BP 188/82 | HR 58 | Ht 63.0 in | Wt 119.6 lb

## 2023-07-25 DIAGNOSIS — I272 Pulmonary hypertension, unspecified: Secondary | ICD-10-CM | POA: Diagnosis not present

## 2023-07-25 DIAGNOSIS — Z95818 Presence of other cardiac implants and grafts: Secondary | ICD-10-CM | POA: Diagnosis not present

## 2023-07-25 DIAGNOSIS — Z9889 Other specified postprocedural states: Secondary | ICD-10-CM | POA: Diagnosis not present

## 2023-07-25 DIAGNOSIS — I10 Essential (primary) hypertension: Secondary | ICD-10-CM

## 2023-07-25 DIAGNOSIS — I5032 Chronic diastolic (congestive) heart failure: Secondary | ICD-10-CM

## 2023-07-25 MED ORDER — AMLODIPINE BESYLATE 5 MG PO TABS: ORAL_TABLET | ORAL | 3 refills | Status: DC

## 2023-07-25 MED ORDER — IRBESARTAN 300 MG PO TABS: ORAL_TABLET | ORAL | 3 refills | Status: DC

## 2023-07-25 NOTE — Patient Instructions (Signed)
Medication Instructions:  START Amlodipine 5mg  once daily in the evenings RESUME Irbesartan 300mg  once daily in the mornings *If you need a refill on your cardiac medications before your next appointment, please call your pharmacy*   Lab Work: NONE If you have labs (blood work) drawn today and your tests are completely normal, you will receive your results only by: MyChart Message (if you have MyChart) OR A paper copy in the mail If you have any lab test that is abnormal or we need to change your treatment, we will call you to review the results.   Testing/Procedures: **Please check blood pressure once daily about same time per day, roughly 2 hours after medications. Use same arm for readings, resting for at least 5 minutes, feet flat on floor**   Follow-Up: At [facility], you and your health needs are our priority.  As part of our continuing mission to provide you with exceptional heart care, we have created designated Provider Care Teams.  These Care Teams include your primary Cardiologist (physician) and Advanced Practice Providers (APPs -  Physician Assistants and Nurse Practitioners) who all work together to provide you with the care you need, when you need it.  Your next appointment:   3 month(s)  Provider:   Jari Favre, PA-C, Ronie Spies, PA-C, Robin Searing, NP, Eligha Bridegroom, NP, Tereso Newcomer, PA-C, or Perlie Gold, PA-C     Then, Tonny Bollman, MD will plan to see you again in 1 year(s).

## 2023-07-25 NOTE — Progress Notes (Signed)
Cardiology Office Note:    Date:  07/25/2023   ID:  Erin [name], DOB Jun 10, 1935, MRN [id number]  PCP:  Jeoffrey Massed, MD   [facility] Providers Cardiologist:  Tonny Bollman, MD     Referring MD: Jeoffrey Massed, MD   Chief Complaint  Patient presents with   Shortness of Breath    History of Present Illness:    Erin [name] is a 88 y.o. female presenting for follow-up of hypertension, chronic diastolic heart failure, pulmonary hypertension, and mitral valve disease status post MitraClip.  When the patient was last seen in June and July 2024, she was noted to have markedly elevated blood pressure readings.  She has been having some difficulty managing this, adjusting her medications and taking her blood pressure frequently multiple times per day.  Her most recent MitraClip procedure performed in May 2024 resulted in marked reduction in her mitral regurgitation, bringing severe mitral regurgitation down to mild.  Her mean transvalvular gradient is now moderately elevated at 7 to 8 mmHg.  She has some shortness of breath with activity, no chest pain, orthopnea, PND, leg edema, or other complaints at this time.  She is taking hydralazine as needed for severely elevated blood pressures.  Past Medical History:  Diagnosis Date   Achilles tendon rupture    left, no surgery required   Chronic diastolic heart failure (HCC)    Gross hematuria 12/2020   Klebsiella on urine clx x2 but no UTI sx's.  CT showed tiny stones in each renal pelvis but no ureteral or bladder stones, no mass.  Urol did cysto->normal. Plan is annual UA, rpt hematuria w/u in 3-5 yrs if persistent pos.   History of kidney stones    Hypertension    Hypertensive retinopathy of both eyes    Dr. Elmer Picker   Low back pain    spondylosis, listhesis, +scoliosis at L/S jxn   Lower extremity edema    lasix prn   OA (osteoarthritis)    Osteopenia    Osteoporosis 07/2021   07/2021 T score -4.6   S/P mitral  valve clip implantation 11/06/2020   s/p TEER with one XTW MitraClip on A2P2 by Dr. Excell Seltzer.  DAPT w/ASA and plavix x 3 mo post procedure recommended.   Severe mitral regurgitation    Mitral valve clip 10/2020.  Clip #25 January 2023   Severe pulmonary hypertension (HCC) 2021   transth echo; moderate by TEE 08/2020   Systolic murmur 07/03/2020   severe mitral regurge, mod/severe tricuspic regurg   Tremor of right hand     Past Surgical History:  Procedure Laterality Date   APPENDECTOMY     88 yrs old   BACK SURGERY  2004   ruptured disc   CHOLECYSTECTOMY     age 71   CYSTOSCOPY  02/24/2021   (for gross hematuria) ->normal.   DEXA  07/2021   2022 T score -4.6 (radius)   LUMBAR LAMINECTOMY  2004   MITRAL VALVE REPAIR N/A 11/06/2020   Procedure: MITRAL VALVE REPAIR;  Surgeon: Tonny Bollman, MD;  Location: [facility] INVASIVE CV LAB;  Service: Cardiovascular;  Laterality: N/A;   MITRAL VALVE REPAIR N/A 03/02/2023   Procedure: MITRAL VALVE REPAIR;  Surgeon: Tonny Bollman, MD;  Location: [facility] INVASIVE CV LAB;  Service: Cardiovascular;  Laterality: N/A;   RIGHT/LEFT HEART CATH AND CORONARY ANGIOGRAPHY N/A 09/16/2020   No CAD. Procedure: RIGHT/LEFT HEART CATH AND CORONARY ANGIOGRAPHY;  Surgeon: Lyn Records, MD;  Location: [facility]  INVASIVE CV LAB;  Service: Cardiovascular;  Laterality: N/A;   TEE WITHOUT CARDIOVERSION N/A 09/16/2020   Procedure: TRANSESOPHAGEAL ECHOCARDIOGRAM (TEE);  Surgeon: Jodelle Red, MD;  Location: [facility] ENDOSCOPY;  Service: Cardiovascular;  Laterality: N/A;   TEE WITHOUT CARDIOVERSION N/A 11/06/2020   Procedure: TRANSESOPHAGEAL ECHOCARDIOGRAM (TEE);  Surgeon: Tonny Bollman, MD;  Location: [facility] INVASIVE CV LAB;  Service: Cardiovascular;  Laterality: N/A;   TEE WITHOUT CARDIOVERSION N/A 06/16/2022   Procedure: TRANSESOPHAGEAL ECHOCARDIOGRAM (TEE);  Surgeon: Thurmon Fair, MD;  Location: [facility] ENDOSCOPY;  Service: Cardiovascular;  Laterality: N/A;   TEE WITHOUT CARDIOVERSION  N/A 03/02/2023   Procedure: TRANSESOPHAGEAL ECHOCARDIOGRAM;  Surgeon: Tonny Bollman, MD;  Location: [facility] INVASIVE CV LAB;  Service: Cardiovascular;  Laterality: N/A;   TONSILLECTOMY     age 32   TOTAL [name] ARTHROPLASTY Left 06/02/2021   Procedure: LEFT TOTAL [name] ARTHROPLASTY;  Surgeon: Sheral Apley, MD;  Location: [facility] ORS;  Service: Orthopedics;  Laterality: Left;   TRANSTHORACIC ECHOCARDIOGRAM  07/03/2020; 10/28/20; 12/03/20   06/2020 TTE EF 60-65%, grd II DD, severe pulm art HTN, severe mitral valve regurg, mod/sev tricuspic valve regurg. Conf on TEE 08/2020.  10/28/20 (s/p MV clip procedure) mild-mod MVR, EF 55-60%. 11/2020 EF 60%, mild/mod MR w/mean grad 4mm hg and mod to sev TR.   TRANSTHORACIC ECHOCARDIOGRAM     10/2022 EF 60-65%, nl LV fxn, grd II DD, severe MV clip regurg, severe pulm HTN.  02/01/23 (post MV clip#2: EF 60-65%, nl RV and LV fxn, inc PA pressure, severe tricusp regurg, no signif MR.   TRANSTHORACIC ECHOCARDIOGRAM     04/20/23-->1 month s/p mTEER: EF 55-60%, s/p mTEER with mild residual MR with mean graident of 7 mm hg as well as severe TR, severe biatrial enlargement and severe pulm HTN. Iatrogenic ASD with mostly left to right shunting.    Current Medications: Current Meds  Medication Sig   amLODipine (NORVASC) 2.5 MG tablet 1 tab po bid   amoxicillin (AMOXIL) 500 MG capsule    Ascorbic Acid (VITAMIN C) 1000 MG tablet Take 1,000 mg by mouth daily.   aspirin 81 MG chewable tablet Chew 1 tablet (81 mg total) by mouth daily.   azelastine (ASTELIN) 0.1 % nasal spray Place 2 sprays into both nostrils 2 (two) times daily.   Calcium Carb-Cholecalciferol (CALCIUM 600 + D PO) Take 1 tablet by mouth daily.   carvedilol (COREG) 25 MG tablet Take 1 tablet (25 mg total) by mouth in the morning and at bedtime.   cyanocobalamin (VITAMIN B12) 1000 MCG tablet Take 1,000 mcg by mouth daily.   furosemide (LASIX) 20 MG tablet 1 tab po 2 days a week   gabapentin (NEURONTIN) 100 MG capsule Take  1 capsule (100 mg total) by mouth 3 (three) times daily.   hydrALAZINE (APRESOLINE) 25 MG tablet 1 tab as needed for bp>180   HYDROcodone-acetaminophen (NORCO/VICODIN) 5-325 MG tablet 1-2 tabs po bid prn pain   ibandronate (BONIVA) 150 MG tablet Take 1 tablet (150 mg total) by mouth every 30 (thirty) days. Take in the morning with a full glass of water, on an empty stomach, and do not take anything else by mouth or lie down for the next 30 min.   irbesartan (AVAPRO) 300 MG tablet 1 tab every evening   Multiple Vitamin (MULTIVITAMIN WITH MINERALS) TABS tablet Take 1 tablet by mouth in the morning.   Multiple Vitamins-Minerals (PRESERVISION AREDS PO) Take 1 tablet by mouth in the morning and at bedtime.    nitroGLYCERIN (  NITRODUR - DOSED IN MG/24 HR) 0.2 mg/hr patch 1/2 patch over L Achilles tendon bid (Patient taking differently: Place 0.2 mg onto the skin daily as needed (blood circulation). On left foot)   omeprazole (PRILOSEC) 20 MG capsule Take 20 mg by mouth daily.   potassium chloride (KLOR-CON) 10 MEQ tablet Take 10 mEq by mouth daily as needed (with furosemide (fluid retention)).   sodium chloride (MURO 128) 5 % ophthalmic ointment Place 1 Application into both eyes at bedtime as needed (dry eyes).   vitamin E 180 MG (400 UNITS) capsule Take 400 Units by mouth in the morning.     Allergies:   Advil [ibuprofen], Barbiturates, Demerol, Doxycycline, Hydrochlorothiazide, Irbesartan, Losartan potassium, Olmesartan medoxomil, and Telmisartan   Social History   Socioeconomic History   Marital status: Married    Spouse name: Not on file   Number of children: Not on file   Years of education: Not on file   Highest education level: Not on file  Occupational History   Not on file  Tobacco Use   Smoking status: Never   Smokeless tobacco: Never  Vaping Use   Vaping status: Never Used  Substance and Sexual Activity   Alcohol use: No   Drug use: Never   Sexual activity: Not Currently   Other Topics Concern   Not on file  Social History Narrative   Married, 6 children, about 15 GC.  8 GGC   Former Diplomatic Services operational officer at [employer].  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   No tob.   No alc.   Social Determinants of Health   Financial Resource Strain: Low Risk  (11/03/2022)   Overall Financial Resource Strain (CARDIA)    Difficulty of Paying Living Expenses: Not hard at all  Food Insecurity: No Food Insecurity (11/03/2022)   Hunger Vital Sign    Worried About Running Out of Food in the Last Year: Never true    Ran Out of Food in the Last Year: Never true  Transportation Needs: No Transportation Needs (11/03/2022)   PRAPARE - Administrator, Civil Service (Medical): No    Lack of Transportation (Non-Medical): No  Physical Activity: Insufficiently Active (11/03/2022)   Exercise Vital Sign    Days of Exercise per Week: 7 days    Minutes of Exercise per Session: 20 min  Stress: No Stress Concern Present (11/03/2022)   [employer] of Occupational Health - Occupational Stress Questionnaire    Feeling of Stress : Not at all  Social Connections: Socially Integrated (11/03/2022)   Social Connection and Isolation Panel [NHANES]    Frequency of Communication with Friends and Family: More than three times a week    Frequency of Social Gatherings with Friends and Family: More than three times a week    Attends Religious Services: More than 4 times per year    Active Member of [employer] or Organizations: Yes    Attends Engineer, structural: More than 4 times per year    Marital Status: Married     Family History: The patient's family history includes Cancer in her father; Early death in her father; Hypertension in her mother and another family member; Liver cancer in her father. There is no history of Colon cancer or Rectal cancer.  ROS:   Please see the history of present illness.    Positive for anxiety, tremor.  All other systems  reviewed and are negative.  EKGs/Labs/Other Studies  Reviewed:    The following studies were reviewed today: Echo 04/20/2023: 1. Left ventricular ejection fraction, by estimation, is 55 to 60%. The  left ventricle has normal function. The left ventricle has no regional  wall motion abnormalities. Left ventricular diastolic function could not  be evaluated.   2. Right ventricular systolic function is normal. The right ventricular  size is normal. There is severely elevated pulmonary artery systolic  pressure. The estimated right ventricular systolic pressure is 75.2 mmHg.   3. Left atrial size was severely dilated.   4. Right atrial size was severely dilated.   5. The mitral valve has been repaired/replaced. Mild mitral valve  regurgitation. Moderate mitral stenosis. The mean mitral valve gradient is  7.0 mmHg. There is a Mitra-Clip present in the mitral position. Procedure  Date: 03/02/23.   6. Tricuspid valve regurgitation is severe.   7. The aortic valve is tricuspid. There is mild calcification of the  aortic valve. There is mild thickening of the aortic valve. Aortic valve  regurgitation is trivial. Aortic valve sclerosis is present, with no  evidence of aortic valve stenosis.   8. Aortic dilatation noted.   9. The inferior vena cava is dilated in size with <50% respiratory  variability, suggesting right atrial pressure of 15 mmHg.  10. Evidence of atrial level shunting detected by color flow Doppler.  There is a small atrial septal defect with predominantly left to right  shunting across the atrial septum.   Comparison(s): No significant change from prior study.       Recent Labs: 02/28/2023: ALT 11; B Natriuretic Peptide 659.1 03/03/2023: Hemoglobin 12.1; Platelets 225 05/26/2023: BUN 23; Creatinine, Ser 0.61; Potassium 4.5; Sodium 140  Recent Lipid Panel    Component Value Date/Time   CHOL 139 07/02/2020 1057   TRIG 30.0 07/02/2020 1057   TRIG 33 09/21/2006 1115   HDL 80.30  07/02/2020 1057   CHOLHDL 2 07/02/2020 1057   VLDL 6.0 07/02/2020 1057   LDLCALC 53 07/02/2020 1057     Risk Assessment/Calculations:         Physical Exam:    VS:  BP (!) 188/72   Pulse (!) 58   Ht 5\' 3"  (1.6 m)   Wt 119 lb 9.6 oz (54.3 kg)   SpO2 98%   BMI 21.19 kg/m     Wt Readings from Last 3 Encounters:  07/25/23 119 lb 9.6 oz (54.3 kg)  07/14/23 120 lb 6.4 oz (54.6 kg)  05/26/23 122 lb 6.4 oz (55.5 kg)     GEN:  Well nourished, well developed in no acute distress HEENT: Normal NECK: No JVD; No carotid bruits LYMPHATICS: No lymphadenopathy CARDIAC: RRR, no murmurs, rubs, gallops RESPIRATORY:  Clear to auscultation without rales, wheezing or rhonchi  ABDOMEN: Soft, non-tender, non-distended MUSCULOSKELETAL:  No edema; No deformity  SKIN: Warm and dry NEUROLOGIC:  Alert and oriented x 3 PSYCHIATRIC:  Normal affect   ASSESSMENT:    1. S/P mitral valve clip implantation   2. Severe pulmonary hypertension (HCC)   3. Chronic diastolic heart failure (HCC)   4. Essential hypertension    PLAN:    In order of problems listed above:  Patient with good MR reduction after her most recent procedure, mild residual MR and mean transmitral gradient 8 mmHg.  Exam is unremarkable with no residual murmur.  She will be due for a repeat echo when she is out to 1 year from the procedure which will be May 2025. Likely related to  longstanding mitral valve disease.  Need to get systemic blood pressure down (see below for discussion). Clinically stable Made some adjustments to her antihypertensive regimen today.  She will continue carvedilol 25 mg twice daily, consolidate amlodipine to 5 mg at nighttime, and resume irbesartan 300 mg in the morning with her a.m. dose of carvedilol.  This way, the patient will be taking 2 antihypertensive drugs in the morning and 2 in the evenings and hopefully this will stabilize her blood pressure.  Will offer her follow-up in the Pharm.D. hypertension  clinic, otherwise plan to see her back in 3 months for an APP visit.     Medication Adjustments/Labs and Tests Ordered: Current medicines are reviewed at length with the patient today.  Concerns regarding medicines are outlined above.  No orders of the defined types were placed in this encounter.  No orders of the defined types were placed in this encounter.   There are no Patient Instructions on file for this visit.   Signed, Tonny Bollman, MD  07/25/2023 10:53 AM    [facility]

## 2023-07-29 ENCOUNTER — Telehealth (HOSPITAL_BASED_OUTPATIENT_CLINIC_OR_DEPARTMENT_OTHER): Payer: Self-pay

## 2023-08-26 ENCOUNTER — Ambulatory Visit: Payer: Medicare Other | Admitting: Family Medicine

## 2023-08-29 ENCOUNTER — Encounter: Payer: Self-pay | Admitting: Family Medicine

## 2023-08-29 ENCOUNTER — Ambulatory Visit: Payer: Medicare Other | Admitting: Family Medicine

## 2023-08-29 VITALS — BP 152/92 | HR 67 | Wt 122.6 lb

## 2023-08-29 DIAGNOSIS — G8929 Other chronic pain: Secondary | ICD-10-CM

## 2023-08-29 DIAGNOSIS — M533 Sacrococcygeal disorders, not elsewhere classified: Secondary | ICD-10-CM | POA: Diagnosis not present

## 2023-08-29 DIAGNOSIS — J0101 Acute recurrent maxillary sinusitis: Secondary | ICD-10-CM | POA: Diagnosis not present

## 2023-08-29 DIAGNOSIS — R0989 Other specified symptoms and signs involving the circulatory and respiratory systems: Secondary | ICD-10-CM | POA: Diagnosis not present

## 2023-08-29 MED ORDER — CARVEDILOL 25 MG PO TABS: ORAL_TABLET | ORAL | Status: DC

## 2023-08-29 MED ORDER — AMOXICILLIN-POT CLAVULANATE 875-125 MG PO TABS: 1.0000 | ORAL_TABLET | Freq: Two times a day (BID) | ORAL | 0 refills | Status: DC

## 2023-08-29 MED ORDER — GABAPENTIN 300 MG PO CAPS: 300.0000 mg | ORAL_CAPSULE | Freq: Every day | ORAL | 1 refills | Status: DC

## 2023-08-29 NOTE — Patient Instructions (Signed)
Take 1/2 carvedilol tab in morning and 1 whole tab in evening.

## 2023-08-29 NOTE — Progress Notes (Signed)
OFFICE VISIT  08/29/2023  CC:  Chief Complaint  Patient presents with   Medical Management of Chronic Issues    Pt inquiring about injection for her hip. Pt also wants to discuss an alternative for BP medication.     Patient is a 88 y.o. female who presents for 72-month follow-up labile hypertension in the setting of chronic diastolic heart failure and mitral regurgitation. A/P as of last visit 07/14/23: "#1 labile hypertension. We discussed the need to comply with recommended regimen and if she feels like change needs to be made then to let us know.  Otherwise things get too confusing. She expressed understanding and agreement with this. At this point I recommended she return to taking Avapro 300 mg a day and continue Coreg 25 mg twice a day.  Today I started amlodipine 2.5 mg to take twice a day. She can reserve hydralazine 25 mg dose as needed for systolic blood pressure greater than 180. I recommended she dispose of her lisinopril in order to avoid future confusion."  INTERIM HX:  She saw her cardiologist 07/25/2023 and she was continued on carvedilol 25 mg twice a day, amlodipine was increased to 5 mg nightly and she was to resume the irbesartan 300 mg in the morning with her a.m. dose of carvedilol.  Blood pressures often normal in the morning.  She is worried because if she takes her carvedilol 25 mg pill at that time she feels like it often drops her blood pressure down into the 80s over 40s when she checks it a couple of hours later.  Therefore, many days she does not take the morning carvedilol.  Her afternoon and evening pressures are often quite elevated--into the 170s systolic and 90s diastolic.  She does take a 25 mg hydralazine fairly often to try to bring this down.  She feels like this helps well. She says she only took the amlodipine 5 mg dose once.  She states that it caused urinary burning so she stopped it.  She says the burning went away.  She has nasal congestions, sinus  pressure, some periorbital headaches.  States Augmentin helped very well last time we use this for the symptoms.  ROS: no vision abnormalities, no chest pain, no lower extremity edema.  Still with chronic right SI joint pain. No lower extremity weakness, no radiation down the leg.  Past Medical History:  Diagnosis Date   Achilles tendon rupture    left, no surgery required   Chronic diastolic heart failure (HCC)    Gross hematuria 12/2020   Klebsiella on urine clx x2 but no UTI sx's.  CT showed tiny stones in each renal pelvis but no ureteral or bladder stones, no mass.  Urol did cysto->normal. Plan is annual UA, rpt hematuria w/u in 3-5 yrs if persistent pos.   History of kidney stones    Hypertension    Hypertensive retinopathy of both eyes    Dr. Elmer Picker   Low back pain    spondylosis, listhesis, +scoliosis at L/S jxn   Lower extremity edema    lasix prn   OA (osteoarthritis)    Osteopenia    Osteoporosis 07/2021   07/2021 T score -4.6   S/P mitral valve clip implantation 11/06/2020   s/p TEER with one XTW MitraClip on A2P2 by Dr. Excell Seltzer.  DAPT w/ASA and plavix x 3 mo post procedure recommended.   Severe mitral regurgitation    Mitral valve clip 10/2020.  Clip #25 January 2023   Severe pulmonary  hypertension (HCC) 2021   transth echo; moderate by TEE 08/2020   Systolic murmur 07/03/2020   severe mitral regurge, mod/severe tricuspic regurg   Tremor of right hand     Past Surgical History:  Procedure Laterality Date   APPENDECTOMY     88 yrs old   BACK SURGERY  2004   ruptured disc   CHOLECYSTECTOMY     age 37   CYSTOSCOPY  02/24/2021   (for gross hematuria) ->normal.   DEXA  07/2021   2022 T score -4.6 (radius)   LUMBAR LAMINECTOMY  2004   RIGHT/LEFT HEART CATH AND CORONARY ANGIOGRAPHY N/A 09/16/2020   No CAD. Procedure: RIGHT/LEFT HEART CATH AND CORONARY ANGIOGRAPHY;  Surgeon: Lyn Records, MD;  Location: [facility] INVASIVE CV LAB;  Service: Cardiovascular;  Laterality:  N/A;   TEE WITHOUT CARDIOVERSION N/A 09/16/2020   Procedure: TRANSESOPHAGEAL ECHOCARDIOGRAM (TEE);  Surgeon: Jodelle Red, MD;  Location: [facility] ENDOSCOPY;  Service: Cardiovascular;  Laterality: N/A;   TEE WITHOUT CARDIOVERSION N/A 11/06/2020   Procedure: TRANSESOPHAGEAL ECHOCARDIOGRAM (TEE);  Surgeon: Tonny Bollman, MD;  Location: [facility] INVASIVE CV LAB;  Service: Cardiovascular;  Laterality: N/A;   TEE WITHOUT CARDIOVERSION N/A 06/16/2022   Procedure: TRANSESOPHAGEAL ECHOCARDIOGRAM (TEE);  Surgeon: Thurmon Fair, MD;  Location: [facility] ENDOSCOPY;  Service: Cardiovascular;  Laterality: N/A;   TEE WITHOUT CARDIOVERSION N/A 03/02/2023   Procedure: TRANSESOPHAGEAL ECHOCARDIOGRAM;  Surgeon: Tonny Bollman, MD;  Location: [facility] INVASIVE CV LAB;  Service: Cardiovascular;  Laterality: N/A;   TONSILLECTOMY     age 63   TOTAL KNEE ARTHROPLASTY Left 06/02/2021   Procedure: LEFT TOTAL KNEE ARTHROPLASTY;  Surgeon: Sheral Apley, MD;  Location: [facility] ORS;  Service: Orthopedics;  Laterality: Left;   TRANSCATHETER MITRAL EDGE TO EDGE REPAIR N/A 11/06/2020   Procedure: MITRAL VALVE REPAIR;  Surgeon: Tonny Bollman, MD;  Location: [facility] INVASIVE CV LAB;  Service: Cardiovascular;  Laterality: N/A;   TRANSCATHETER MITRAL EDGE TO EDGE REPAIR N/A 03/02/2023   Procedure: MITRAL VALVE REPAIR;  Surgeon: Tonny Bollman, MD;  Location: [facility] INVASIVE CV LAB;  Service: Cardiovascular;  Laterality: N/A;   TRANSTHORACIC ECHOCARDIOGRAM  07/03/2020; 10/28/20; 12/03/20   06/2020 TTE EF 60-65%, grd II DD, severe pulm art HTN, severe mitral valve regurg, mod/sev tricuspic valve regurg. Conf on TEE 08/2020.  10/28/20 (s/p MV clip procedure) mild-mod MVR, EF 55-60%. 11/2020 EF 60%, mild/mod MR w/mean grad 4mm hg and mod to sev TR.   TRANSTHORACIC ECHOCARDIOGRAM     10/2022 EF 60-65%, nl LV fxn, grd II DD, severe MV clip regurg, severe pulm HTN.  02/01/23 (post MV clip#2: EF 60-65%, nl RV and LV fxn, inc PA pressure, severe tricusp regurg, no  signif MR.   TRANSTHORACIC ECHOCARDIOGRAM     04/20/23-->1 month s/p mTEER: EF 55-60%, s/p mTEER with mild residual MR with mean graident of 7 mm hg as well as severe TR, severe biatrial enlargement and severe pulm HTN. Iatrogenic ASD with mostly left to right shunting.    Outpatient Medications Prior to Visit  Medication Sig Dispense Refill   amoxicillin (AMOXIL) 500 MG capsule      Ascorbic Acid (VITAMIN C) 1000 MG tablet Take 1,000 mg by mouth daily.     azelastine (ASTELIN) 0.1 % nasal spray Place 2 sprays into both nostrils 2 (two) times daily. 30 mL 12   Calcium Carb-Cholecalciferol (CALCIUM 600 + D PO) Take 1 tablet by mouth daily.     cyanocobalamin (VITAMIN B12) 1000 MCG tablet Take 1,000 mcg by mouth  daily.     furosemide (LASIX) 20 MG tablet 1 tab po 2 days a week 180 tablet 1   hydrALAZINE (APRESOLINE) 25 MG tablet 1 tab as needed for bp>180     HYDROcodone-acetaminophen (NORCO/VICODIN) 5-325 MG tablet 1-2 tabs po bid prn pain 30 tablet 0   ibandronate (BONIVA) 150 MG tablet Take 1 tablet (150 mg total) by mouth every 30 (thirty) days. Take in the morning with a full glass of water, on an empty stomach, and do not take anything else by mouth or lie down for the next 30 min. 3 tablet 3   irbesartan (AVAPRO) 300 MG tablet Take 1 tablet by mouth daily in the morning 90 tablet 3   Multiple Vitamin (MULTIVITAMIN WITH MINERALS) TABS tablet Take 1 tablet by mouth in the morning.     Multiple Vitamins-Minerals (PRESERVISION AREDS PO) Take 1 tablet by mouth in the morning and at bedtime.      nitroGLYCERIN (NITRODUR - DOSED IN MG/24 HR) 0.2 mg/hr patch 1/2 patch over L Achilles tendon bid (Patient taking differently: Place 0.2 mg onto the skin daily as needed (blood circulation). On left foot) 30 patch 12   omeprazole (PRILOSEC) 20 MG capsule Take 20 mg by mouth daily.     potassium chloride (KLOR-CON) 10 MEQ tablet Take 10 mEq by mouth daily as needed (with furosemide (fluid retention)).      sodium chloride (MURO 128) 5 % ophthalmic ointment Place 1 Application into both eyes at bedtime as needed (dry eyes).     vitamin E 180 MG (400 UNITS) capsule Take 400 Units by mouth in the morning.     gabapentin (NEURONTIN) 100 MG capsule Take 1 capsule (100 mg total) by mouth 3 (three) times daily. 90 capsule 3   aspirin 81 MG chewable tablet Chew 1 tablet (81 mg total) by mouth daily. (Patient not taking: Reported on 08/29/2023)     amLODipine (NORVASC) 5 MG tablet Take 1 tablet by mouth daily in the evening (Patient not taking: Reported on 08/29/2023) 90 tablet 3   carvedilol (COREG) 25 MG tablet Take 1 tablet (25 mg total) by mouth in the morning and at bedtime. 180 tablet 1   No facility-administered medications prior to visit.    Allergies  Allergen Reactions   Advil [Ibuprofen]     Bleeds excessively    Barbiturates Nausea And Vomiting   Demerol Nausea And Vomiting   Doxycycline Nausea Only   Hydrochlorothiazide Other (See Comments)    unknown   Irbesartan     HA   Losartan Potassium     hair loss   Olmesartan Medoxomil     hair loss   Telmisartan     cramps    Review of Systems As per HPI  PE:    08/29/2023    1:30 PM 08/29/2023    1:17 PM 07/25/2023   11:15 AM  Vitals with BMI  Weight  122 lbs 10 oz   Systolic 152 189 191  Diastolic 92 94 82  Pulse  67    Rpt bp 152/92  Physical Exam  Gen: Alert, well appearing.  Patient is oriented to person, place, time, and situation. AFFECT: pleasant, lucid thought and speech. Right SI joint tenderness to palpation. No further exam today.  LABS:  Last CBC Lab Results  Component Value Date   WBC 9.5 03/03/2023   HGB 12.1 03/03/2023   HCT 39.0 03/03/2023   MCV 90.7 03/03/2023   MCH 28.1 03/03/2023  RDW 14.8 03/03/2023   PLT 225 03/03/2023   Last metabolic panel Lab Results  Component Value Date   GLUCOSE 94 05/26/2023   NA 140 05/26/2023   K 4.5 05/26/2023   CL 103 05/26/2023   CO2 30 05/26/2023   BUN  23 05/26/2023   CREATININE 0.61 05/26/2023   GFR 81.05 05/26/2023   CALCIUM 8.7 05/26/2023   PROT 6.3 (L) 02/28/2023   ALBUMIN 3.6 02/28/2023   BILITOT 0.8 02/28/2023   ALKPHOS 56 02/28/2023   AST 20 02/28/2023   ALT 11 02/28/2023   ANIONGAP 7 03/03/2023   Last lipids Lab Results  Component Value Date   CHOL 139 07/02/2020   HDL 80.30 07/02/2020   LDLCALC 53 07/02/2020   TRIG 30.0 07/02/2020   CHOLHDL 2 07/02/2020   Last thyroid functions Lab Results  Component Value Date   TSH 2.37 07/02/2020    IMPRESSION AND PLAN:  #1 labile hypertension. States she cannot tolerate amlodipine. Episodic hypotension mid mornings. Will decrease morning carvedilol to HALF of a 25 mg tab and continue the 25 mg whole tab at supper. Continue irbesartan 300 mg a day.  Continue hydralazine 25 mg as needed for blood pressure greater than 180.  #2 right SI joint pain.  This was more prominent earlier this summer. I referred her for physical therapy back in July but is not clear whether she went to this or not.  We had planned for her to return for a SI joint injection but the symptoms seemed to get a little better for a little while. Will have her return for the injection now. Also, she states gabapentin 300 mg helps better for her pain at night (currently takes 100mg  at bedtime).  Prescription sent today, gabapentin 300 mg, 1 nightly, #90, refill x 1..  #3 acute sinusitis, recurrent. Augmentin 875 twice daily x 10 days prescribed today.  An After Visit Summary was printed and given to the patient.  FOLLOW UP: Return in about 2 weeks (around 09/12/2023) for f/u HTN.  Pt to make appt at her convenience for SI joint injection with me.  Signed:  Santiago Bumpers, MD           08/29/2023

## 2023-08-31 ENCOUNTER — Encounter: Payer: Self-pay | Admitting: Family Medicine

## 2023-08-31 ENCOUNTER — Ambulatory Visit: Payer: Medicare Other | Admitting: Family Medicine

## 2023-08-31 VITALS — BP 138/88 | HR 67 | Wt 122.0 lb

## 2023-08-31 DIAGNOSIS — M533 Sacrococcygeal disorders, not elsewhere classified: Secondary | ICD-10-CM

## 2023-08-31 DIAGNOSIS — G8929 Other chronic pain: Secondary | ICD-10-CM

## 2023-08-31 MED ORDER — TRIAMCINOLONE ACETONIDE 40 MG/ML IJ SUSP
40.0000 mg | Freq: Once | INTRAMUSCULAR | Status: AC
  Administered 2023-09-01: 40 mg via INTRA_ARTICULAR

## 2023-08-31 NOTE — Progress Notes (Signed)
OFFICE NOTE  08/31/2023  CC:  Chief Complaint  Patient presents with   Injections    Pt here for steroid injection in hip.    Patient is a 88 y.o.  female who is here for right SI joint steroid injection. A/P as of last visit: "Right SI joint pain.  This was more prominent earlier this summer. I referred her for physical therapy back in July but is not clear whether she went to this or not.  We had planned for her to return for a SI joint injection but the symptoms seemed to get a little better for a little while. Will have her return for the injection now. Also, she states gabapentin 300 mg helps better for her pain at night (currently takes 100mg  at bedtime).  Prescription sent today, gabapentin 300 mg, 1 nightly, #90, refill x 1."  Pertinent PMH: Lumbar spondylosis, listhesis, +scoliosis at L/S jxn. Hx of lumbar laminectomy 2004  [facility];   Outpatient Medications Prior to Visit  Medication Sig Dispense Refill   amoxicillin (AMOXIL) 500 MG capsule      amoxicillin-clavulanate (AUGMENTIN) 875-125 MG tablet Take 1 tablet by mouth 2 (two) times daily. 20 tablet 0   Ascorbic Acid (VITAMIN C) 1000 MG tablet Take 1,000 mg by mouth daily.     aspirin 81 MG chewable tablet Chew 1 tablet (81 mg total) by mouth daily.     azelastine (ASTELIN) 0.1 % nasal spray Place 2 sprays into both nostrils 2 (two) times daily. 30 mL 12   Calcium Carb-Cholecalciferol (CALCIUM 600 + D PO) Take 1 tablet by mouth daily.     carvedilol (COREG) 25 MG tablet 1/2 tab po qAM and 1 tab po qPM     cyanocobalamin (VITAMIN B12) 1000 MCG tablet Take 1,000 mcg by mouth daily.     furosemide (LASIX) 20 MG tablet 1 tab po 2 days a week 180 tablet 1   gabapentin (NEURONTIN) 300 MG capsule Take 1 capsule (300 mg total) by mouth at bedtime. 90 capsule 1   hydrALAZINE (APRESOLINE) 25 MG tablet 1 tab as needed for bp>180     HYDROcodone-acetaminophen (NORCO/VICODIN) 5-325 MG tablet 1-2 tabs po bid prn pain 30 tablet 0    ibandronate (BONIVA) 150 MG tablet Take 1 tablet (150 mg total) by mouth every 30 (thirty) days. Take in the morning with a full glass of water, on an empty stomach, and do not take anything else by mouth or lie down for the next 30 min. 3 tablet 3   irbesartan (AVAPRO) 300 MG tablet Take 1 tablet by mouth daily in the morning 90 tablet 3   Multiple Vitamin (MULTIVITAMIN WITH MINERALS) TABS tablet Take 1 tablet by mouth in the morning.     Multiple Vitamins-Minerals (PRESERVISION AREDS PO) Take 1 tablet by mouth in the morning and at bedtime.      nitroGLYCERIN (NITRODUR - DOSED IN MG/24 HR) 0.2 mg/hr patch 1/2 patch over L Achilles tendon bid (Patient taking differently: Place 0.2 mg onto the skin daily as needed (blood circulation). On left foot) 30 patch 12   omeprazole (PRILOSEC) 20 MG capsule Take 20 mg by mouth daily.     potassium chloride (KLOR-CON) 10 MEQ tablet Take 10 mEq by mouth daily as needed (with furosemide (fluid retention)).     sodium chloride (MURO 128) 5 % ophthalmic ointment Place 1 Application into both eyes at bedtime as needed (dry eyes).     vitamin E 180 MG (400 UNITS)  capsule Take 400 Units by mouth in the morning.     No facility-administered medications prior to visit.    PE:    08/31/2023    1:34 PM 08/29/2023    1:30 PM 08/29/2023    1:17 PM  Vitals with BMI  Weight 122 lbs  122 lbs 10 oz  Systolic 138 152 161  Diastolic 88 92 94  Pulse 67  67    Pt alert, oriented and well appearing. No erythema, swelling, or warmth in L/S region or glut region. Mild TTP over R SI jt.  LABS: none today  Lab Results  Component Value Date   WBC 9.5 03/03/2023   HGB 12.1 03/03/2023   HCT 39.0 03/03/2023   MCV 90.7 03/03/2023   PLT 225 03/03/2023      Chemistry      Component Value Date/Time   NA 140 05/26/2023 1056   NA 146 (H) 06/07/2022 1406   K 4.5 05/26/2023 1056   CL 103 05/26/2023 1056   CO2 30 05/26/2023 1056   BUN 23 05/26/2023 1056   BUN 15  06/07/2022 1406   CREATININE 0.61 05/26/2023 1056   CREATININE 0.61 01/14/2021 1138      Component Value Date/Time   CALCIUM 8.7 05/26/2023 1056   ALKPHOS 56 02/28/2023 0900   AST 20 02/28/2023 0900   ALT 11 02/28/2023 0900   BILITOT 0.8 02/28/2023 0900     IMPRESSION AND PLAN:  Right SI joint pain/dysfunction. Failed conservative treatments. Pt desires SI steroid inj.  Ultrasound-guided injection is preferred based on studies that show increased duration, increased effect, greater accuracy, decreased procedural pain, increased response rate, and decreased cost with ultrasound-guided versus blind injection. Procedure: Real-time ultrasound guided injection of right SI joint. Device: GE [employer] informed consent obtained.  Timeout conducted.  No overlying erythema, induration, or other signs of local infection. After sterile prep with Betadine, injected 3 mL of 1% plain lidocaine for local anesthesia.  I then injected a mixture of 40 mg Kenalog and 3 mL of 1% plain lidocaine using 3-1/2 inch 22-gauge spinal needle. injectate seen filling inferior aspect of SI joint. Patient tolerated the procedure well.  No immediate complications.  Post-injection care discussed. Advised to call if fever/chills, erythema, drainage, or persistent bleeding.  Impression: Technically successful ultrasound-guided injection.   An After Visit Summary was printed and given to the patient.  FOLLOW UP:  Return for keep appt set for 09/12/23.  Signed:  Santiago Bumpers, MD           08/31/2023

## 2023-09-11 NOTE — Progress Notes (Unsigned)
OFFICE VISIT  09/12/2023  CC:  Chief Complaint  Patient presents with   Hypertension    F/U. Pt forgot to bring readings from home with her today; she states BP is higher in the morning and goes down throughout the day.     Patient is a 88 y.o. female who presents for f/u HTN. A/P as of last visit: "1 labile hypertension. States she cannot tolerate amlodipine. Episodic hypotension mid mornings. Will decrease morning carvedilol to HALF of a 25 mg tab and continue the 25 mg whole tab at supper. Continue irbesartan 300 mg a day.  Continue hydralazine 25 mg as needed for blood pressure greater than 180."  INTERIM HX: Right SI jt inj 08/31/23--> this helped moderately well.  Home blood pressures average around 140 over 70s. She takes hydralazine 25 mg tab in the morning and in the evening for more significant elevations of systolic blood pressure. She takes an additional half tab as needed.  Also taking carvedilol 25 mg tab, one half every morning and 1 tab every afternoon as well as irbesartan 300 mg daily.  No headaches, no chest pain, no shortness of breath, no lower extremity swelling. No focal weakness.  Past Medical History:  Diagnosis Date   Achilles tendon rupture    left, no surgery required   Chronic diastolic heart failure (HCC)    Gross hematuria 12/2020   Klebsiella on urine clx x2 but no UTI sx's.  CT showed tiny stones in each renal pelvis but no ureteral or bladder stones, no mass.  Urol did cysto->normal. Plan is annual UA, rpt hematuria w/u in 3-5 yrs if persistent pos.   History of kidney stones    Hypertension    Hypertensive retinopathy of both eyes    Dr. Elmer Picker   Low back pain    spondylosis, listhesis, +scoliosis at L/S jxn   Lower extremity edema    lasix prn   OA (osteoarthritis)    Osteopenia    Osteoporosis 07/2021   07/2021 T score -4.6   S/P mitral valve clip implantation 11/06/2020   s/p TEER with one XTW MitraClip on A2P2 by Dr. Excell Seltzer.  DAPT  w/ASA and plavix x 3 mo post procedure recommended.   Severe mitral regurgitation    Mitral valve clip 10/2020.  Clip #25 January 2023   Severe pulmonary hypertension (HCC) 2021   transth echo; moderate by TEE 08/2020   Systolic murmur 07/03/2020   severe mitral regurge, mod/severe tricuspic regurg   Tremor of right hand     Past Surgical History:  Procedure Laterality Date   APPENDECTOMY     88 yrs old   BACK SURGERY  2004   ruptured disc   CHOLECYSTECTOMY     age 64   CYSTOSCOPY  02/24/2021   (for gross hematuria) ->normal.   DEXA  07/2021   2022 T score -4.6 (radius)   LUMBAR LAMINECTOMY  2004   RIGHT/LEFT HEART CATH AND CORONARY ANGIOGRAPHY N/A 09/16/2020   No CAD. Procedure: RIGHT/LEFT HEART CATH AND CORONARY ANGIOGRAPHY;  Surgeon: Lyn Records, MD;  Location: [facility] INVASIVE CV LAB;  Service: Cardiovascular;  Laterality: N/A;   TEE WITHOUT CARDIOVERSION N/A 09/16/2020   Procedure: TRANSESOPHAGEAL ECHOCARDIOGRAM (TEE);  Surgeon: Jodelle Red, MD;  Location: [facility] ENDOSCOPY;  Service: Cardiovascular;  Laterality: N/A;   TEE WITHOUT CARDIOVERSION N/A 11/06/2020   Procedure: TRANSESOPHAGEAL ECHOCARDIOGRAM (TEE);  Surgeon: Tonny Bollman, MD;  Location: [facility] INVASIVE CV LAB;  Service: Cardiovascular;  Laterality: N/A;  TEE WITHOUT CARDIOVERSION N/A 06/16/2022   Procedure: TRANSESOPHAGEAL ECHOCARDIOGRAM (TEE);  Surgeon: Thurmon Fair, MD;  Location: [facility] ENDOSCOPY;  Service: Cardiovascular;  Laterality: N/A;   TEE WITHOUT CARDIOVERSION N/A 03/02/2023   Procedure: TRANSESOPHAGEAL ECHOCARDIOGRAM;  Surgeon: Tonny Bollman, MD;  Location: [facility] INVASIVE CV LAB;  Service: Cardiovascular;  Laterality: N/A;   TONSILLECTOMY     age 22   TOTAL KNEE ARTHROPLASTY Left 06/02/2021   Procedure: LEFT TOTAL KNEE ARTHROPLASTY;  Surgeon: Sheral Apley, MD;  Location: [facility] ORS;  Service: Orthopedics;  Laterality: Left;   TRANSCATHETER MITRAL EDGE TO EDGE REPAIR N/A 11/06/2020   Procedure: MITRAL  VALVE REPAIR;  Surgeon: Tonny Bollman, MD;  Location: [facility] INVASIVE CV LAB;  Service: Cardiovascular;  Laterality: N/A;   TRANSCATHETER MITRAL EDGE TO EDGE REPAIR N/A 03/02/2023   Procedure: MITRAL VALVE REPAIR;  Surgeon: Tonny Bollman, MD;  Location: [facility] INVASIVE CV LAB;  Service: Cardiovascular;  Laterality: N/A;   TRANSTHORACIC ECHOCARDIOGRAM  07/03/2020; 10/28/20; 12/03/20   06/2020 TTE EF 60-65%, grd II DD, severe pulm art HTN, severe mitral valve regurg, mod/sev tricuspic valve regurg. Conf on TEE 08/2020.  10/28/20 (s/p MV clip procedure) mild-mod MVR, EF 55-60%. 11/2020 EF 60%, mild/mod MR w/mean grad 4mm hg and mod to sev TR.   TRANSTHORACIC ECHOCARDIOGRAM     10/2022 EF 60-65%, nl LV fxn, grd II DD, severe MV clip regurg, severe pulm HTN.  02/01/23 (post MV clip#2: EF 60-65%, nl RV and LV fxn, inc PA pressure, severe tricusp regurg, no signif MR.   TRANSTHORACIC ECHOCARDIOGRAM     04/20/23-->1 month s/p mTEER: EF 55-60%, s/p mTEER with mild residual MR with mean graident of 7 mm hg as well as severe TR, severe biatrial enlargement and severe pulm HTN. Iatrogenic ASD with mostly left to right shunting.    Outpatient Medications Prior to Visit  Medication Sig Dispense Refill   amoxicillin (AMOXIL) 500 MG capsule      amoxicillin-clavulanate (AUGMENTIN) 875-125 MG tablet Take 1 tablet by mouth 2 (two) times daily. 20 tablet 0   Ascorbic Acid (VITAMIN C) 1000 MG tablet Take 1,000 mg by mouth daily.     aspirin 81 MG chewable tablet Chew 1 tablet (81 mg total) by mouth daily.     azelastine (ASTELIN) 0.1 % nasal spray Place 2 sprays into both nostrils 2 (two) times daily. 30 mL 12   Calcium Carb-Cholecalciferol (CALCIUM 600 + D PO) Take 1 tablet by mouth daily.     carvedilol (COREG) 25 MG tablet 1/2 tab po qAM and 1 tab po qPM     cyanocobalamin (VITAMIN B12) 1000 MCG tablet Take 1,000 mcg by mouth daily.     furosemide (LASIX) 20 MG tablet 1 tab po 2 days a week 180 tablet 1   gabapentin  (NEURONTIN) 300 MG capsule Take 1 capsule (300 mg total) by mouth at bedtime. 90 capsule 1   hydrALAZINE (APRESOLINE) 25 MG tablet 1 tab as needed for bp>180     HYDROcodone-acetaminophen (NORCO/VICODIN) 5-325 MG tablet 1-2 tabs po bid prn pain 30 tablet 0   ibandronate (BONIVA) 150 MG tablet Take 1 tablet (150 mg total) by mouth every 30 (thirty) days. Take in the morning with a full glass of water, on an empty stomach, and do not take anything else by mouth or lie down for the next 30 min. 3 tablet 3   irbesartan (AVAPRO) 300 MG tablet Take 1 tablet by mouth daily in the morning 90 tablet 3  Multiple Vitamin (MULTIVITAMIN WITH MINERALS) TABS tablet Take 1 tablet by mouth in the morning.     Multiple Vitamins-Minerals (PRESERVISION AREDS PO) Take 1 tablet by mouth in the morning and at bedtime.      nitroGLYCERIN (NITRODUR - DOSED IN MG/24 HR) 0.2 mg/hr patch 1/2 patch over L Achilles tendon bid (Patient taking differently: Place 0.2 mg onto the skin daily as needed (blood circulation). On left foot) 30 patch 12   omeprazole (PRILOSEC) 20 MG capsule Take 20 mg by mouth daily.     potassium chloride (KLOR-CON) 10 MEQ tablet Take 10 mEq by mouth daily as needed (with furosemide (fluid retention)).     sodium chloride (MURO 128) 5 % ophthalmic ointment Place 1 Application into both eyes at bedtime as needed (dry eyes).     vitamin E 180 MG (400 UNITS) capsule Take 400 Units by mouth in the morning.     No facility-administered medications prior to visit.    Allergies  Allergen Reactions   Advil [Ibuprofen]     Bleeds excessively    Barbiturates Nausea And Vomiting   Demerol Nausea And Vomiting   Doxycycline Nausea Only   Hydrochlorothiazide Other (See Comments)    unknown   Irbesartan     HA   Losartan Potassium     hair loss   Olmesartan Medoxomil     hair loss   Telmisartan     cramps    Review of Systems As per HPI  PE:    09/12/2023    1:45 PM 08/31/2023    1:34 PM  08/29/2023    1:30 PM  Vitals with BMI  Weight 119 lbs 3 oz 122 lbs   Systolic 163 138 914  Diastolic 73 88 92  Pulse 51 67      Physical Exam  Gen: Alert, well appearing.  Patient is oriented to person, place, time, and situation. AFFECT: pleasant, lucid thought and speech. CV: RRR, no m/r/g.   LUNGS: CTA bilat, nonlabored resps, good aeration in all lung fields. EXT: no clubbing or cyanosis.  no edema.    LABS:  Last CBC Lab Results  Component Value Date   WBC 9.5 03/03/2023   HGB 12.1 03/03/2023   HCT 39.0 03/03/2023   MCV 90.7 03/03/2023   MCH 28.1 03/03/2023   RDW 14.8 03/03/2023   PLT 225 03/03/2023   Last metabolic panel Lab Results  Component Value Date   GLUCOSE 94 05/26/2023   NA 140 05/26/2023   K 4.5 05/26/2023   CL 103 05/26/2023   CO2 30 05/26/2023   BUN 23 05/26/2023   CREATININE 0.61 05/26/2023   GFR 81.05 05/26/2023   CALCIUM 8.7 05/26/2023   PROT 6.3 (L) 02/28/2023   ALBUMIN 3.6 02/28/2023   BILITOT 0.8 02/28/2023   ALKPHOS 56 02/28/2023   AST 20 02/28/2023   ALT 11 02/28/2023   ANIONGAP 7 03/03/2023   Last thyroid functions Lab Results  Component Value Date   TSH 2.37 07/02/2020   IMPRESSION AND PLAN:  #1 labile hypertension. Good control on current regimen: Continue hydralazine 25 mg twice daily scheduled and she may take a half dose PRN significant elevation of her blood pressure.  Continue carvedilol 25 mg, one half tab every morning and 1 tab every afternoon as well as irbesartan 300 mg a day. Electrolytes and creatinine normal 3 months ago.  #2 right SI joint dysfunction/pain. She got a moderate amount of improvement with SI joint steroid injection 2 weeks  ago.  An After Visit Summary was printed and given to the patient.  FOLLOW UP: Return in about 2 weeks (around 09/26/2023) for f/u sleep/tremor/bp.  Signed:  Santiago Bumpers, MD           09/12/2023

## 2023-09-12 ENCOUNTER — Ambulatory Visit: Payer: Medicare Other | Admitting: Family Medicine

## 2023-09-12 ENCOUNTER — Encounter: Payer: Self-pay | Admitting: Family Medicine

## 2023-09-12 VITALS — BP 163/73 | HR 51 | Wt 119.2 lb

## 2023-09-12 DIAGNOSIS — M533 Sacrococcygeal disorders, not elsewhere classified: Secondary | ICD-10-CM | POA: Diagnosis not present

## 2023-09-12 DIAGNOSIS — R0989 Other specified symptoms and signs involving the circulatory and respiratory systems: Secondary | ICD-10-CM | POA: Diagnosis not present

## 2023-09-12 DIAGNOSIS — I10 Essential (primary) hypertension: Secondary | ICD-10-CM | POA: Diagnosis not present

## 2023-09-12 DIAGNOSIS — G8929 Other chronic pain: Secondary | ICD-10-CM | POA: Diagnosis not present

## 2023-09-12 MED ORDER — DIAZEPAM 2 MG PO TABS: ORAL_TABLET | ORAL | 0 refills | Status: DC

## 2023-10-03 ENCOUNTER — Other Ambulatory Visit: Payer: Self-pay | Admitting: Family Medicine

## 2023-10-10 ENCOUNTER — Other Ambulatory Visit: Payer: Self-pay | Admitting: Family Medicine

## 2023-10-24 ENCOUNTER — Ambulatory Visit: Payer: Medicare Other | Admitting: Physician Assistant

## 2023-11-17 DIAGNOSIS — H00025 Hordeolum internum left lower eyelid: Secondary | ICD-10-CM | POA: Diagnosis not present

## 2023-12-07 ENCOUNTER — Other Ambulatory Visit: Payer: Self-pay

## 2023-12-07 ENCOUNTER — Ambulatory Visit: Payer: Medicare Other

## 2023-12-07 DIAGNOSIS — H40013 Open angle with borderline findings, low risk, bilateral: Secondary | ICD-10-CM | POA: Diagnosis not present

## 2023-12-07 DIAGNOSIS — H18592 Other hereditary corneal dystrophies, left eye: Secondary | ICD-10-CM | POA: Diagnosis not present

## 2023-12-07 DIAGNOSIS — H35361 Drusen (degenerative) of macula, right eye: Secondary | ICD-10-CM | POA: Diagnosis not present

## 2023-12-07 DIAGNOSIS — H353122 Nonexudative age-related macular degeneration, left eye, intermediate dry stage: Secondary | ICD-10-CM | POA: Diagnosis not present

## 2023-12-07 DIAGNOSIS — H348312 Tributary (branch) retinal vein occlusion, right eye, stable: Secondary | ICD-10-CM | POA: Diagnosis not present

## 2023-12-07 DIAGNOSIS — H00025 Hordeolum internum left lower eyelid: Secondary | ICD-10-CM | POA: Diagnosis not present

## 2023-12-20 ENCOUNTER — Other Ambulatory Visit: Payer: Self-pay

## 2023-12-20 ENCOUNTER — Encounter (HOSPITAL_BASED_OUTPATIENT_CLINIC_OR_DEPARTMENT_OTHER): Payer: Self-pay | Admitting: Emergency Medicine

## 2023-12-20 ENCOUNTER — Ambulatory Visit: Payer: Medicare Other | Admitting: Family Medicine

## 2023-12-20 ENCOUNTER — Emergency Department (HOSPITAL_BASED_OUTPATIENT_CLINIC_OR_DEPARTMENT_OTHER): Payer: Medicare Other

## 2023-12-20 ENCOUNTER — Ambulatory Visit: Payer: Self-pay | Admitting: Family Medicine

## 2023-12-20 ENCOUNTER — Emergency Department (HOSPITAL_BASED_OUTPATIENT_CLINIC_OR_DEPARTMENT_OTHER)
Admission: EM | Admit: 2023-12-20 | Discharge: 2023-12-20 | Disposition: A | Discharge status: Home / Self Care | Payer: Medicare Other | Attending: Emergency Medicine | Admitting: Emergency Medicine

## 2023-12-20 DIAGNOSIS — S0003XA Contusion of scalp, initial encounter: Secondary | ICD-10-CM | POA: Insufficient documentation

## 2023-12-20 DIAGNOSIS — W19XXXA Unspecified fall, initial encounter: Secondary | ICD-10-CM | POA: Diagnosis not present

## 2023-12-20 DIAGNOSIS — R55 Syncope and collapse: Secondary | ICD-10-CM | POA: Insufficient documentation

## 2023-12-20 DIAGNOSIS — Z23 Encounter for immunization: Secondary | ICD-10-CM | POA: Diagnosis not present

## 2023-12-20 DIAGNOSIS — K838 Other specified diseases of biliary tract: Secondary | ICD-10-CM | POA: Insufficient documentation

## 2023-12-20 DIAGNOSIS — M545 Low back pain, unspecified: Secondary | ICD-10-CM | POA: Insufficient documentation

## 2023-12-20 DIAGNOSIS — I517 Cardiomegaly: Secondary | ICD-10-CM | POA: Diagnosis not present

## 2023-12-20 DIAGNOSIS — W108XXA Fall (on) (from) other stairs and steps, initial encounter: Secondary | ICD-10-CM

## 2023-12-20 DIAGNOSIS — M47816 Spondylosis without myelopathy or radiculopathy, lumbar region: Secondary | ICD-10-CM | POA: Diagnosis not present

## 2023-12-20 DIAGNOSIS — I10 Essential (primary) hypertension: Secondary | ICD-10-CM | POA: Diagnosis not present

## 2023-12-20 DIAGNOSIS — R000 Tachycardia, unspecified: Secondary | ICD-10-CM | POA: Diagnosis not present

## 2023-12-20 DIAGNOSIS — R220 Localized swelling, mass and lump, head: Secondary | ICD-10-CM | POA: Diagnosis not present

## 2023-12-20 DIAGNOSIS — M48061 Spinal stenosis, lumbar region without neurogenic claudication: Secondary | ICD-10-CM | POA: Diagnosis not present

## 2023-12-20 DIAGNOSIS — Z7982 Long term (current) use of aspirin: Secondary | ICD-10-CM | POA: Diagnosis not present

## 2023-12-20 DIAGNOSIS — M419 Scoliosis, unspecified: Secondary | ICD-10-CM | POA: Diagnosis not present

## 2023-12-20 DIAGNOSIS — I700 Atherosclerosis of aorta: Secondary | ICD-10-CM | POA: Diagnosis not present

## 2023-12-20 DIAGNOSIS — R42 Dizziness and giddiness: Secondary | ICD-10-CM | POA: Diagnosis not present

## 2023-12-20 DIAGNOSIS — W109XXA Fall (on) (from) unspecified stairs and steps, initial encounter: Secondary | ICD-10-CM | POA: Insufficient documentation

## 2023-12-20 DIAGNOSIS — S3992XA Unspecified injury of lower back, initial encounter: Secondary | ICD-10-CM | POA: Diagnosis not present

## 2023-12-20 DIAGNOSIS — R519 Headache, unspecified: Secondary | ICD-10-CM | POA: Diagnosis present

## 2023-12-20 DIAGNOSIS — J324 Chronic pansinusitis: Secondary | ICD-10-CM | POA: Diagnosis not present

## 2023-12-20 DIAGNOSIS — N281 Cyst of kidney, acquired: Secondary | ICD-10-CM | POA: Diagnosis not present

## 2023-12-20 LAB — URINALYSIS, ROUTINE W REFLEX MICROSCOPIC
Glucose, UA: NEGATIVE mg/dL
Hgb urine dipstick: NEGATIVE
Ketones, ur: 15 mg/dL — AB
Nitrite: NEGATIVE
Protein, ur: 30 mg/dL — AB
Specific Gravity, Urine: >=1.03 (ref 1.005–1.030)
pH: 5 (ref 5.0–8.0)

## 2023-12-20 LAB — COMPREHENSIVE METABOLIC PANEL
ALT: 18 U/L (ref 0–44)
AST: 22 U/L (ref 15–41)
Albumin: 4 g/dL (ref 3.5–5.0)
Alkaline Phosphatase: 46 U/L (ref 38–126)
Anion gap: 9 (ref 5–15)
BUN: 28 mg/dL — ABNORMAL HIGH (ref 8–23)
CO2: 27 mmol/L (ref 22–32)
Calcium: 9.2 mg/dL (ref 8.9–10.3)
Chloride: 103 mmol/L (ref 98–111)
Creatinine, Ser: 0.47 mg/dL (ref 0.44–1.00)
GFR, Estimated: >60 mL/min (ref >60)
Glucose, Bld: 98 mg/dL (ref 70–99)
Potassium: 4.3 mmol/L (ref 3.5–5.1)
Sodium: 139 mmol/L (ref 135–145)
Total Bilirubin: 1.3 mg/dL — ABNORMAL HIGH (ref 0.0–1.2)
Total Protein: 6.7 g/dL (ref 6.5–8.1)

## 2023-12-20 LAB — CBC
HCT: 39.7 % (ref 36.0–46.0)
Hemoglobin: 12.6 g/dL (ref 12.0–15.0)
MCH: 28.9 pg (ref 26.0–34.0)
MCHC: 31.7 g/dL (ref 30.0–36.0)
MCV: 91.1 fL (ref 80.0–100.0)
Platelets: 228 10*3/uL (ref 150–400)
RBC: 4.36 MIL/uL (ref 3.87–5.11)
RDW: 15.4 % (ref 11.5–15.5)
WBC: 9.6 10*3/uL (ref 4.0–10.5)
nRBC: 0 % (ref 0.0–0.2)

## 2023-12-20 LAB — RESP PANEL BY RT-PCR (RSV, FLU A&B, COVID)  RVPGX2
Influenza A by PCR: NEGATIVE
Influenza B by PCR: NEGATIVE
Resp Syncytial Virus by PCR: NEGATIVE
SARS Coronavirus 2 by RT PCR: NEGATIVE

## 2023-12-20 LAB — URINALYSIS, MICROSCOPIC (REFLEX)

## 2023-12-20 LAB — TROPONIN I (HIGH SENSITIVITY): Troponin I (High Sensitivity): 7 ng/L (ref <18)

## 2023-12-20 MED ORDER — IOHEXOL 350 MG/ML SOLN
100.0000 mL | Freq: Once | INTRAVENOUS | Status: AC | PRN
  Administered 2023-12-20: 100 mL via INTRAVENOUS

## 2023-12-20 MED ORDER — TETANUS-DIPHTH-ACELL PERTUSSIS 5-2.5-18.5 LF-MCG/0.5 IM SUSY
0.5000 mL | PREFILLED_SYRINGE | Freq: Once | INTRAMUSCULAR | Status: AC
  Administered 2023-12-20: 0.5 mL via INTRAMUSCULAR
  Filled 2023-12-20: qty 0.5

## 2023-12-20 MED ORDER — HYDROCODONE-ACETAMINOPHEN 5-325 MG PO TABS
1.0000 | ORAL_TABLET | Freq: Once | ORAL | Status: AC
  Administered 2023-12-20: 1 via ORAL
  Filled 2023-12-20: qty 1

## 2023-12-20 MED ORDER — HYDROCODONE-ACETAMINOPHEN 5-325 MG PO TABS
1.0000 | ORAL_TABLET | Freq: Four times a day (QID) | ORAL | 0 refills | Status: DC | PRN
Start: 2023-12-20 — End: 2024-03-05

## 2023-12-20 NOTE — Discharge Instructions (Addendum)
 You are seen in the emergency department today after a fall.  As we discussed, your blood work and imaging all looked reassuring.  We saw no evidence of any fractures of the bones or any internal bleeding.   The CT scan of your abdomen did show some dilation of your common bile duct. I recommend following up with your primary doctor regarding this as it may be pertinent to pursue further imaging of this.  Please follow up with your primary doctor regarding your fluctuating blood pressures.   We discussed possible admission to the hospital and you have declined this. Continue to monitor how you're doing and return to the ER for new or worsening symptoms.

## 2023-12-20 NOTE — ED Provider Notes (Signed)
 [facility] [facility] Provider Note   CSN: [id number] Arrival date & time: 12/20/23  1350     History  Chief Complaint  Patient presents with   Erin [name] is a 89 y.o. female with hx of HTN, osteoarthritis, severe mitral and tricuspid valve regurgitation, pulmonary hypertension, osteoporosis, who presents to the ER after a fall from last night.  Patient states that she was going to put on pajama pants and go a few steps up to her bedroom.  The next thing she knew she was at the bottom of the adjacent stairs.  She did not have any chest pain, did not feel lightheaded or dizzy.  She had been feeling dizzy with a posterior headache for the past few days, but yesterday during the day had felt improved.  Her husband heard her after her fall, around midnight, at the bottom of 5 steps.  Patient describes it as "blacked out".  She did strike her head. Complaining of back in the back of her head, her buttocks, and a skin tear to her left forearm. Was evaluated by EMS last night but refused transport.    Fall Associated symptoms include headaches.       Home Medications Prior to Admission medications   Medication Sig Start Date End Date Taking? Authorizing Provider  HYDROcodone-acetaminophen (NORCO/VICODIN) 5-325 MG tablet Take 1 tablet by mouth every 6 (six) hours as needed for severe pain (pain score 7-10). 12/20/23  Yes Grayton Lobo T, PA-C  Ascorbic Acid (VITAMIN C) 1000 MG tablet Take 1,000 mg by mouth daily.    [provider]  aspirin 81 MG chewable tablet Chew 1 tablet (81 mg total) by mouth daily. 03/03/23   Janetta Hora, PA-C  azelastine (ASTELIN) 0.1 % nasal spray Place 2 sprays into both nostrils 2 (two) times daily. 05/12/23   McGowen, Maryjean Morn, MD  Calcium Carb-Cholecalciferol (CALCIUM 600 + D PO) Take 1 tablet by mouth daily.    [provider]  carvedilol (COREG) 25 MG tablet 1/2 tab po qAM and 1 tab po  qPM 08/29/23   McGowen, Maryjean Morn, MD  cyanocobalamin (VITAMIN B12) 1000 MCG tablet Take 1,000 mcg by mouth daily.    [provider]  diazepam (VALIUM) 2 MG tablet 1 tab po every evening as needed for anxiety and sleep 09/12/23   McGowen, Maryjean Morn, MD  furosemide (LASIX) 20 MG tablet 1 tab po 2 days a week 05/26/23   McGowen, Maryjean Morn, MD  gabapentin (NEURONTIN) 300 MG capsule Take 1 capsule (300 mg total) by mouth at bedtime. 08/29/23   McGowen, Maryjean Morn, MD  hydrALAZINE (APRESOLINE) 25 MG tablet 1 tab as needed for bp>180 07/14/23   McGowen, Maryjean Morn, MD  ibandronate (BONIVA) 150 MG tablet Take 1 tablet (150 mg total) by mouth every 30 (thirty) days. Take in the morning with a full glass of water, on an empty stomach, and do not take anything else by mouth or lie down for the next 30 min. 05/12/23   McGowen, Maryjean Morn, MD  irbesartan (AVAPRO) 300 MG tablet Take 1 tablet by mouth daily in the morning 07/25/23   Tonny Bollman, MD  lisinopril (ZESTRIL) 20 MG tablet Take 20 mg by mouth daily. 07/20/23   [provider]  Multiple Vitamin (MULTIVITAMIN WITH MINERALS) TABS tablet Take 1 tablet by mouth in the morning.    [provider]  Multiple Vitamins-Minerals (PRESERVISION AREDS PO) Take  1 tablet by mouth in the morning and at bedtime.     [provider]  nitroGLYCERIN (NITRODUR - DOSED IN MG/24 HR) 0.2 mg/hr patch 1/2 patch over L Achilles tendon bid Patient taking differently: Place 0.2 mg onto the skin daily as needed (blood circulation). On left foot 11/25/21   Tonny Bollman, MD  omeprazole (PRILOSEC) 20 MG capsule Take 20 mg by mouth daily.    [provider]  potassium chloride (KLOR-CON) 10 MEQ tablet Take 10 mEq by mouth daily as needed (with furosemide (fluid retention)).    [provider]  sodium chloride (MURO 128) 5 % ophthalmic ointment Place 1 Application into both eyes at bedtime as needed (dry eyes).    [provider]  vitamin  E 180 MG (400 UNITS) capsule Take 400 Units by mouth in the morning.    [provider]      Allergies    Advil [ibuprofen], Barbiturates, Demerol, Doxycycline, Hydrochlorothiazide, Irbesartan, Losartan potassium, Olmesartan medoxomil, and Telmisartan    Review of Systems   Review of Systems  Musculoskeletal:  Positive for arthralgias.  Neurological:  Positive for dizziness and headaches.  All other systems reviewed and are negative.   Physical Exam Updated Vital Signs BP (!) 165/72   Pulse 79   Temp 98.1 F (36.7 C) (Oral)   Resp 14   Wt 52.2 kg   SpO2 95%   BMI 20.37 kg/m  Physical Exam Vitals and nursing note reviewed.  Constitutional:      Appearance: Normal appearance.  HENT:     Head: Normocephalic. Contusion present.   Eyes:     Conjunctiva/sclera: Conjunctivae normal.  Neck:     Comments: No midline spinal tenderness or deformities palpated Cardiovascular:     Rate and Rhythm: Normal rate and regular rhythm.     Pulses: Normal pulses.     Heart sounds: Murmur heard.     Diastolic murmur is present.  Pulmonary:     Effort: Pulmonary effort is normal. No respiratory distress.     Breath sounds: Normal breath sounds.  Chest:     Comments: Chest wall stable Abdominal:     General: There is no distension.     Palpations: Abdomen is soft.     Tenderness: There is no abdominal tenderness.  Musculoskeletal:     Comments: Pelvis stable, 5/5 strength of all extremities, compartments of extremities are soft. No midline spinal tenderness, step offs or crepitus to the entirety of the spine. Generalized tenderness to the buttocks.   Skin:    General: Skin is warm and dry.  Neurological:     General: No focal deficit present.     Mental Status: She is alert.     ED Results / Procedures / Treatments   Labs (all labs ordered are listed, but only abnormal results are displayed) Labs Reviewed  URINALYSIS, ROUTINE W REFLEX MICROSCOPIC - Abnormal; Notable  for the following components:      Result Value   Color, Urine AMBER (*)    APPearance HAZY (*)    Bilirubin Urine SMALL (*)    Ketones, ur 15 (*)    Protein, ur 30 (*)    Leukocytes,Ua SMALL (*)    All other components within normal limits  COMPREHENSIVE METABOLIC PANEL - Abnormal; Notable for the following components:   BUN 28 (*)    Total Bilirubin 1.3 (*)    All other components within normal limits  URINALYSIS, MICROSCOPIC (REFLEX) - Abnormal; Notable for  the following components:   Bacteria, UA RARE (*)    All other components within normal limits  RESP PANEL BY RT-PCR (RSV, FLU A&B, COVID)  RVPGX2  CBC  TROPONIN I (HIGH SENSITIVITY)    EKG EKG Interpretation Date/Time:  Tuesday December 20 2023 14:03:12 EST Ventricular Rate:  66 PR Interval:  176 QRS Duration:  84 QT Interval:  426 QTC Calculation: 446 R Axis:   58  Text Interpretation: Normal sinus rhythm Nonspecific ST and T wave abnormality Abnormal ECG When compared with ECG of 20-Apr-2023 15:28, PREVIOUS ECG IS PRESENT No significant change since last tracing Confirmed by Jacalyn Lefevre [phone]) on 12/20/2023 2:34:37 PM  Radiology CT Angio Chest PE W and/or Wo Contrast Result Date: 12/20/2023 CLINICAL DATA:  Blunt trauma to the abdomen. Fall and dizziness. Concern for pulmonary embolism. EXAM: CT ANGIOGRAPHY CHEST CT ABDOMEN AND PELVIS WITH CONTRAST TECHNIQUE: Multidetector CT imaging of the chest was performed using the standard protocol during bolus administration of intravenous contrast. Multiplanar CT image reconstructions and MIPs were obtained to evaluate the vascular anatomy. Multidetector CT imaging of the abdomen and pelvis was performed using the standard protocol during bolus administration of intravenous contrast. RADIATION DOSE REDUCTION: This exam was performed according to the departmental dose-optimization program which includes automated exposure control, adjustment of the mA and/or kV according to  patient size and/or use of iterative reconstruction technique. CONTRAST:  OMNIPAQUE IOHEXOL 350 MG/ML SOLN COMPARISON:  Chest radiograph dated 02/28/2023 and CT abdomen pelvis dated 02/04/2021. FINDINGS: CTA CHEST FINDINGS Cardiovascular: There is cardiomegaly with dilatation of the right atrium. No pericardial effusion. Mild atherosclerotic calcification of the thoracic aorta. No aneurysmal dilatation. No pulmonary artery embolus identified. Mediastinum/Nodes: No hilar or mediastinal adenopathy. The esophagus is grossly unremarkable. No mediastinal fluid collection. Lungs/Pleura: No focal consolidation, pleural effusion, or pneumothorax. The central airways are patent. Musculoskeletal: Osteopenia with degenerative changes of the spine. No acute osseous pathology. Review of the MIP images confirms the above findings. CT ABDOMEN and PELVIS FINDINGS No intra-abdominal free air.  Trace free fluid in the pelvis. Hepatobiliary: Slight heterogeneous enhancement of the liver. The correlation with liver enzymes recommended. Cholecystectomy. There is dilatation of the common bile duct measuring up to 3 cm in diameter and significantly progressed since the prior CT. No calcified stone noted in the central CBD. Further evaluation with MRI/MRCP on a nonemergent/outpatient basis recommended. Pancreas: Unremarkable. No pancreatic ductal dilatation or surrounding inflammatory changes. Spleen: Normal in size without focal abnormality. Adrenals/Urinary Tract: The adrenal glands unremarkable. A 4 cm right renal lower pole cyst. There is no hydronephrosis on either side. The urinary bladder is grossly unremarkable. Stomach/Bowel: There is no bowel obstruction or active inflammation. Sigmoid diverticulosis. No CT evidence of acute appendicitis. Vascular/Lymphatic: Mild aortoiliac atherosclerotic disease. The IVC is unremarkable. No portal venous gas. There is no adenopathy. Reproductive: The uterus is grossly unremarkable. No  suspicious adnexal masses. Other: Small fat containing umbilical hernia. Musculoskeletal: Osteopenia with degenerative changes of the spine. No acute osseous pathology. Review of the MIP images confirms the above findings. IMPRESSION: 1. No acute intrathoracic pathology. No CT evidence of pulmonary embolism. 2. No bowel obstruction. Sigmoid diverticulosis. 3. Significant dilatation of the common bile duct, significantly progressed since the prior CT. Correlation with LFTs and further evaluation with MRI/MRCP on a nonemergent/outpatient basis recommended. 4.  Aortic Atherosclerosis (ICD10-I70.0). Electronically Signed   By: Elgie Collard M.D.   On: 12/20/2023 17:18   CT ABDOMEN PELVIS W CONTRAST Result Date: 12/20/2023 CLINICAL  DATA:  Blunt trauma to the abdomen. Fall and dizziness. Concern for pulmonary embolism. EXAM: CT ANGIOGRAPHY CHEST CT ABDOMEN AND PELVIS WITH CONTRAST TECHNIQUE: Multidetector CT imaging of the chest was performed using the standard protocol during bolus administration of intravenous contrast. Multiplanar CT image reconstructions and MIPs were obtained to evaluate the vascular anatomy. Multidetector CT imaging of the abdomen and pelvis was performed using the standard protocol during bolus administration of intravenous contrast. RADIATION DOSE REDUCTION: This exam was performed according to the departmental dose-optimization program which includes automated exposure control, adjustment of the mA and/or kV according to patient size and/or use of iterative reconstruction technique. CONTRAST:  OMNIPAQUE IOHEXOL 350 MG/ML SOLN COMPARISON:  Chest radiograph dated 02/28/2023 and CT abdomen pelvis dated 02/04/2021. FINDINGS: CTA CHEST FINDINGS Cardiovascular: There is cardiomegaly with dilatation of the right atrium. No pericardial effusion. Mild atherosclerotic calcification of the thoracic aorta. No aneurysmal dilatation. No pulmonary artery embolus identified. Mediastinum/Nodes: No  hilar or mediastinal adenopathy. The esophagus is grossly unremarkable. No mediastinal fluid collection. Lungs/Pleura: No focal consolidation, pleural effusion, or pneumothorax. The central airways are patent. Musculoskeletal: Osteopenia with degenerative changes of the spine. No acute osseous pathology. Review of the MIP images confirms the above findings. CT ABDOMEN and PELVIS FINDINGS No intra-abdominal free air.  Trace free fluid in the pelvis. Hepatobiliary: Slight heterogeneous enhancement of the liver. The correlation with liver enzymes recommended. Cholecystectomy. There is dilatation of the common bile duct measuring up to 3 cm in diameter and significantly progressed since the prior CT. No calcified stone noted in the central CBD. Further evaluation with MRI/MRCP on a nonemergent/outpatient basis recommended. Pancreas: Unremarkable. No pancreatic ductal dilatation or surrounding inflammatory changes. Spleen: Normal in size without focal abnormality. Adrenals/Urinary Tract: The adrenal glands unremarkable. A 4 cm right renal lower pole cyst. There is no hydronephrosis on either side. The urinary bladder is grossly unremarkable. Stomach/Bowel: There is no bowel obstruction or active inflammation. Sigmoid diverticulosis. No CT evidence of acute appendicitis. Vascular/Lymphatic: Mild aortoiliac atherosclerotic disease. The IVC is unremarkable. No portal venous gas. There is no adenopathy. Reproductive: The uterus is grossly unremarkable. No suspicious adnexal masses. Other: Small fat containing umbilical hernia. Musculoskeletal: Osteopenia with degenerative changes of the spine. No acute osseous pathology. Review of the MIP images confirms the above findings. IMPRESSION: 1. No acute intrathoracic pathology. No CT evidence of pulmonary embolism. 2. No bowel obstruction. Sigmoid diverticulosis. 3. Significant dilatation of the common bile duct, significantly progressed since the prior CT. Correlation with LFTs  and further evaluation with MRI/MRCP on a nonemergent/outpatient basis recommended. 4.  Aortic Atherosclerosis (ICD10-I70.0). Electronically Signed   By: Elgie Collard M.D.   On: 12/20/2023 17:18   CT L-SPINE NO CHARGE Result Date: 12/20/2023 CLINICAL DATA:  Trauma, fall EXAM: CT LUMBAR SPINE WITHOUT CONTRAST TECHNIQUE: Multidetector CT imaging of the lumbar spine was performed without intravenous contrast administration. Multiplanar CT image reconstructions were also generated. RADIATION DOSE REDUCTION: This exam was performed according to the departmental dose-optimization program which includes automated exposure control, adjustment of the mA and/or kV according to patient size and/or use of iterative reconstruction technique. COMPARISON:  MRI lumbar spine 10/19/2006 FINDINGS: Segmentation: 5 lumbar type vertebrae. Alignment: Lumbar lordosis is maintained. Grade 1 anterolisthesis of L3 on L4 is similar to prior. S-shaped scoliotic curvature of the thoracolumbar spine with dextrocurvature at the thoracolumbar junction centered at T12-L1 and levocurvature centered at L4-5. Vertebrae: No acute fracture or focal pathologic process. Paraspinal and other soft tissues: The paraspinal  musculature is unremarkable. Large right extrarenal pelvis. Small amount of contrast is noted within the collecting systems bilaterally. Possible layering nonobstructing calculus versus layering contrast within the right extrarenal pelvis. Partially visualized right renal cyst. Partially visualized common bile duct appears dilated atherosclerosis of the abdominal aorta and branch vessels. Disc levels: T12-L1: Small disc bulge. Bilateral facet arthrosis. No significant spinal canal stenosis. Mild foraminal stenosis on the left. L1-2: Mild disc height loss. Disc bulge and posterior osteophytes slightly eccentric to the left. Bilateral facet arthrosis and thickening of the ligamentum flavum. Mild spinal canal stenosis. Moderate to severe  left foraminal stenosis. L2-3: Moderate disc height loss and vacuum disc phenomenon. Disc bulge and posterior osteophytes. Bilateral facet arthrosis and thickening of the ligamentum flavum resulting in at least moderate osseous spinal canal stenosis. Moderate bilateral foraminal stenosis. L3-4: Mild disc height loss. Diffuse disc bulge and posterior osteophytes slightly eccentric to the left. Moderate bilateral facet arthrosis. At least moderate osseous spinal canal stenosis. No significant foraminal stenosis. L4-5: Mild disc height loss. Diffuse disc bulge. Bilateral facet arthrosis. Moderate spinal canal stenosis. Mild-to-moderate foraminal stenosis on the right. L5-S1: Moderate disc height loss. Diffuse disc bulge eccentric to the left. Bilateral facet arthrosis. No significant spinal canal stenosis. There is possible narrowing of the left lateral recess. Mild-to-moderate right and mild left foraminal stenosis. IMPRESSION: 1. No acute fracture or traumatic malalignment of the lumbar spine. 2. Degenerative changes as above. There is at least moderate spinal canal stenosis at L2-3, L3-4, and L4-5. Foraminal stenosis greatest on the left at L1-2. 3. Scoliosis. 4. Dilation of the partially visualized common bile duct. This is likely similar to prior according to prior report but no images available for direct comparison. Correlate with laboratory values and consider right upper quadrant ultrasound if indicated. 5. Possible non-obstructing calculus versus layering contrast within the right extrarenal pelvis. 6. Aortic Atherosclerosis (ICD10-I70.0). Electronically Signed   By: Emily Filbert M.D.   On: 12/20/2023 17:15   CT Head Wo Contrast Result Date: 12/20/2023 CLINICAL DATA:  Trauma, fall last night, dizziness. EXAM: CT HEAD WITHOUT CONTRAST CT CERVICAL SPINE WITHOUT CONTRAST TECHNIQUE: Multidetector CT imaging of the head and cervical spine was performed following the standard protocol without intravenous  contrast. Multiplanar CT image reconstructions of the cervical spine were also generated. RADIATION DOSE REDUCTION: This exam was performed according to the departmental dose-optimization program which includes automated exposure control, adjustment of the mA and/or kV according to patient size and/or use of iterative reconstruction technique. COMPARISON:  None Available. FINDINGS: CT HEAD FINDINGS Brain: No acute intracranial hemorrhage. No CT evidence of acute infarct. Nonspecific hypoattenuation in the periventricular and subcortical white matter favored to reflect chronic microvascular ischemic changes. No edema, mass effect, or midline shift. The basilar cisterns are patent. Ventricles: The ventricles are normal. Vascular: Atherosclerosis of the carotid siphons. No hyperdense vessel. Skull: No acute or aggressive finding. Orbits: Orbits are symmetric. Sinuses: Complete opacification of the left frontal sinus and near complete opacification of the right frontal sinus. Additional near complete opacification of the anterior ethmoid air cells. Complete opacification of the left sphenoid sinus and right maxillary sinus. New complete opacification of the left sphenoid sinus. Postsurgical changes of turbinectomy on the left. Thickening of the sinus walls suggestive of mucoperiosteal reaction. Other: Mastoid air cells are clear. Right parietal scalp hematoma with surrounding soft tissue swelling. CT CERVICAL SPINE FINDINGS Alignment: Alignment is maintained. No listhesis. No facet subluxation or dislocation. Skull base and vertebrae: Nonfusion of the anterior  and posterior arches of C1, likely congenital. No compression fracture or acute displaced fracture in the cervical spine. Mild irregularity of the C6 inferior endplate likely related to degenerative changes. Soft tissues and spinal canal: No prevertebral fluid or swelling. No visible canal hematoma. Disc levels: Mild intervertebral disc space narrowing at C6-7.  Small disc osteophyte complexes at multiple levels. No high-grade osseous spinal canal stenosis. Facet arthrosis throughout the cervical spine. Upper chest: Negative. Other: None. IMPRESSION: CT HEAD: 1. No CT evidence of acute intracranial abnormality. 2. Right parietal scalp hematoma with surrounding soft tissue swelling. 3. Chronic pansinusitis. CT CERVICAL SPINE: 1. No acute fracture or traumatic malalignment of the cervical spine. 2. Multilevel degenerative changes of the cervical spine as described above. 3. 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 Electronically Signed   By: Emily Filbert M.D.   On: 12/20/2023 17:02   CT Cervical Spine Wo Contrast Result Date: 12/20/2023 CLINICAL DATA:  Trauma, fall last night, dizziness. EXAM: CT HEAD WITHOUT CONTRAST CT CERVICAL SPINE WITHOUT CONTRAST TECHNIQUE: Multidetector CT imaging of the head and cervical spine was performed following the standard protocol without intravenous contrast. Multiplanar CT image reconstructions of the cervical spine were also generated. RADIATION DOSE REDUCTION: This exam was performed according to the departmental dose-optimization program which includes automated exposure control, adjustment of the mA and/or kV according to patient size and/or use of iterative reconstruction technique. COMPARISON:  None Available. FINDINGS: CT HEAD FINDINGS Brain: No acute intracranial hemorrhage. No CT evidence of acute infarct. Nonspecific hypoattenuation in the periventricular and subcortical white matter favored to reflect chronic microvascular ischemic changes. No edema, mass effect, or midline shift. The basilar cisterns are patent. Ventricles: The ventricles are normal. Vascular: Atherosclerosis of the carotid siphons. No hyperdense vessel. Skull: No acute or aggressive finding. Orbits: Orbits are symmetric. Sinuses: Complete opacification of the left frontal sinus and near complete opacification of the right  frontal sinus. Additional near complete opacification of the anterior ethmoid air cells. Complete opacification of the left sphenoid sinus and right maxillary sinus. New complete opacification of the left sphenoid sinus. Postsurgical changes of turbinectomy on the left. Thickening of the sinus walls suggestive of mucoperiosteal reaction. Other: Mastoid air cells are clear. Right parietal scalp hematoma with surrounding soft tissue swelling. CT CERVICAL SPINE FINDINGS Alignment: Alignment is maintained. No listhesis. No facet subluxation or dislocation. Skull base and vertebrae: Nonfusion of the anterior and posterior arches of C1, likely congenital. No compression fracture or acute displaced fracture in the cervical spine. Mild irregularity of the C6 inferior endplate likely related to degenerative changes. Soft tissues and spinal canal: No prevertebral fluid or swelling. No visible canal hematoma. Disc levels: Mild intervertebral disc space narrowing at C6-7. Small disc osteophyte complexes at multiple levels. No high-grade osseous spinal canal stenosis. Facet arthrosis throughout the cervical spine. Upper chest: Negative. Other: None. IMPRESSION: CT HEAD: 1. No CT evidence of acute intracranial abnormality. 2. Right parietal scalp hematoma with surrounding soft tissue swelling. 3. Chronic pansinusitis. CT CERVICAL SPINE: 1. No acute fracture or traumatic malalignment of the cervical spine. 2. Multilevel degenerative changes of the cervical spine as described above. 3. 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 Electronically Signed   By: Emily Filbert M.D.   On: 12/20/2023 17:02    Procedures Procedures    Medications Ordered in ED Medications  iohexol (OMNIPAQUE) 350 MG/ML injection 100 mL (100 mLs Intravenous Contrast Given 12/20/23 1555)  Tdap (BOOSTRIX)  injection 0.5 mL (0.5 mLs Intramuscular Given 12/20/23 1824)  HYDROcodone-acetaminophen (NORCO/VICODIN) 5-325 MG per  tablet 1 tablet (1 tablet Oral Given 12/20/23 1823)    ED Course/ Medical Decision Making/ A&P                                 Medical Decision Making Amount and/or Complexity of Data Reviewed Labs: ordered. Radiology: ordered.  Risk Prescription drug management.  This patient is a 89 y.o. female  who presents to the ED for concern of syncope with head trauma.   Differential diagnoses prior to evaluation: The emergent differential diagnosis includes, but is not limited to,  CVA, ACS, arrhythmia, vasovagal / orthostatic hypotension, sepsis, hypoglycemia, electrolyte disturbance, respiratory failure, anemia, dehydration, heat injury, polypharmacy, fractures, dislocation, intracranial bleeding, internal bleeding. This is not an exhaustive differential.   Past Medical History / Co-morbidities / Social History: HTN, osteoarthritis, severe mitral and tricuspid valve regurgitation, pulmonary hypertension, osteoporosis  Additional history: Chart reviewed. Pertinent results include: Reviewed telephone encounters and PCP notes from earlier today. PDMP reviewed.   Physical Exam: Physical exam performed. The pertinent findings include: Mildly hypertensive, otherwise normal vitals, no acute distress. Contusion to the occiput without breaks in the skin. No midline spinal tenderness, step offs or crepitus to the entirety of the spine. Generalized tenderness to the buttocks.   Lab Tests/Imaging studies: I personally interpreted labs/imaging and the pertinent results include:  CBC and CMP unremarkable. UA with small leukocytes, rare bacteria, <10 WBCs. Troponin 7. Respiratory panel negative.   CT head, cervical spine, chest/abd/pelvis all without acute traumatic findings. No evidence of PE. Dilatation of the common bile duct, recommended clinical/lab correlation. I agree with the radiologist interpretation.  Cardiac monitoring: EKG obtained and interpreted by myself and attending physician which  shows: NSR, no significant change compared to prior   Medications: I ordered medication including norco for pain.  I have reviewed the patients home medicines and have made adjustments as needed.   Disposition: After consideration of the diagnostic results and the patients response to treatment, I feel that emergency department workup does not suggest an emergent condition at present. Patient, her family, and myself had a thorough discussion regarding possible admission for further syncope workup. Patient is adamant that she does not want to stay in the hospital, but plans to follow up with her primary doctor regarding her fall and her headaches/dizziness the past few days. I encouraged her to keep a record of her blood pressures as well. Patient demonstrates capacity to make medical decisions and is declining further evaluation and admission today. She has a reassuring exam, labs and imaging today. Explained CT results and patient will follow up with her PCP regarding her common bile duct dilatation. Pt requesting pain medication PRN for home, short prescription has been sent.   The patient is safe for discharge and has been instructed to return immediately for worsening symptoms, change in symptoms or any other concerns. Patient and family are agreeable to the plan.   Final Clinical Impression(s) / ED Diagnoses Final diagnoses:  Syncope, unspecified syncope type  Fall down stairs, initial encounter  Common bile duct dilatation    Rx / DC Orders ED Discharge Orders          Ordered    HYDROcodone-acetaminophen (NORCO/VICODIN) 5-325 MG tablet  Every 6 hours PRN        12/20/23 1829  Portions of this report may have been transcribed using voice recognition software. Every effort was made to ensure accuracy; however, inadvertent computerized transcription errors may be present.    Jeanella Flattery 12/20/23 1837    Terrilee Files, MD 12/21/23 1014

## 2023-12-20 NOTE — Telephone Encounter (Addendum)
 Spoke with pt's son to advise of Dr.Kuneff's recommendations for pt to be evaluated at ED. Pt may need stitches and imaging which would best done at the ED. Pt's son declined to take patient to ED due to long wait and stated they will be showing up at 2pm for Dr.Kuneff to lay eyes on her. It was reiterated that she needs to go to the ED. Patient's son declined. Dr.Kuneff made aware the son declined.

## 2023-12-20 NOTE — Progress Notes (Signed)
    Pt reportedly sustained a fall from a syncopal episode last night. She reportedly has a knot on her head, can not walk from hip pain and has a laceration of her arm.  Patient/family refused to take medical advice from the triage nurse and a second time from this provider to take his mother to the ED for immediate evaluation and treatment. Therefore delaying treatment for patient's syncopal episode with a head injury. Son stated he would show up at our office at 2 pm to have her "evaluated."    Nurse Triage note: fall- hit head, skin tear arm, sore buttocks Symptoms: patient states she was getting ready for bed and she had a "pressure" in her head- got dizzy and fell- if she lost consciousness it was only for seconds- she reports. EMS was called- but patient refused to go to ED  Then it states > Patients daughter states mom blacked out and got dizzy and passed out fell down a short flight of stairs, lump on head all skin off of left arm. Right hip maybe butt, can barley walk.   @1 :20pm, Pt canceled 2:00 pm appt- daughter is taking her to the ED

## 2023-12-20 NOTE — ED Triage Notes (Signed)
 Fell last night , dizziness prior to fall .  Per spuse , he heard her falling down 4 steps . , semi conscious , .  No blood thinners .   Head injury , left forearm skin tear. Buttocks muscles pain .  Occipital hematoma .  EMS was called last night , pt refused transport .

## 2023-12-20 NOTE — ED Notes (Signed)
 Discharge instructions reviewed with patient. Patient verbalizes understanding, no further questions at this time. Medications/prescriptions and follow up information provided. No acute distress noted at time of departure.

## 2023-12-20 NOTE — Telephone Encounter (Signed)
  Chief Complaint: fall- hit head, skin tear arm, sore buttocks Symptoms: patient states she was getting ready for bed and she had a "pressure" in her head- got dizzy and fell- if she lost consciousness it was only for seconds- she reports. EMS was called- but patient refused to go to ED Frequency: fell last night- possible increasing dizziness Pertinent Negatives: Patient denies symptoms now Disposition: [] ED /[] Urgent Care (no appt availability in office) / [x] Appointment(In office/virtual)/ []  [facility]/ [] Home Care/ [] Refused Recommended Disposition /[] [facility]/ []  Follow-up with PCP Additional Notes: Patient is aware she heads imaging for her fall- she will not go to ED- appointment scheduled for evaluation.    Copied from CRM [id number]. Topic: Clinical - Red Word Triage >> Dec 20, 2023 11:05 AM Gurney Maxin H wrote: [facility] that prompted transfer to Nurse Triage: Patients daughter states mom blacked out and got dizzy and passed out fell down a short flight of stairs, lump on head all skin off of left arm. Right hip maybe butt, can barley walk. Reason for Disposition  [1] Age over 64 years AND [2] swelling or bruise  Answer Assessment - Initial Assessment Questions 1. MECHANISM: "How did the injury happen?" For falls, ask: "What height did you fall from?" and "What surface did you fall against?"      Fell on stairs- 3 stairs- does not remember how she fell- got ready for bed and fell 2. ONSET: "When did the injury happen?" (Minutes or hours ago)      Last night-1 am- couple days before she had had some dizziness 3. NEUROLOGIC SYMPTOMS: "Was there any loss of consciousness?" "Are there any other neurological symptoms?"      Got dizzy and slipped/fell, not sure of consciousness- brief 4. MENTAL STATUS: "Does the person know who they are, who you are, and where they are?"      Not confused 5. LOCATION: "What part of the head was hit?"      Bump on head-back of  head 6. SCALP APPEARANCE: "What does the scalp look like? Is it bleeding now?" If Yes, ask: "Is it difficult to stop?"      No cut 7. SIZE: For cuts, bruises, or swelling, ask: "How large is it?" (e.g., inches or centimeters)      small 8. PAIN: "Is there any pain?" If Yes, ask: "How bad is it?"  (e.g., Scale 1-10; or mild, moderate, severe)     Muscle soreness in buttock.   10. OTHER SYMPTOMS: "Do you have any other symptoms?" (e.g., neck pain, vomiting)       Skin tear on left arm, soreness in hip/butt  Protocols used: Head Injury-A-AH

## 2024-01-31 ENCOUNTER — Other Ambulatory Visit: Payer: Self-pay | Admitting: Family Medicine

## 2024-02-27 ENCOUNTER — Telehealth: Payer: Self-pay | Admitting: Cardiovascular Disease

## 2024-02-27 NOTE — Telephone Encounter (Signed)
 Error

## 2024-03-02 NOTE — Progress Notes (Unsigned)
 [facility]ART [facility] VASCULAR CENTER   MULTIDISCIPLINARY HEART VALVE CLINIC                                     Cardiology Office Note:    Date:  03/02/2024   ID:  [name], DOB 02/19/1935, MRN [id number]  PCP:  Shelvia Dick, MD  [facility] Cardiologist:  Arnoldo Lapping, MD  [facility] Structural heart: Arnoldo Lapping, MD [facility] Electrophysiologist:  None   Referring MD: Shelvia Dick, MD   1 year s/p mTEER  History of Present Illness:    Erin [name] is a 89 y.o. female with a hx of HTN, OA, chronic diastolic CHF, severe pulmonary HTN, moderate TR and severe MR s/p mTEER (10/2020, 02/2023) who presents to clinic for follow up.    The patient underwent mTEER for treatment of prolapse and flail of the posterior leaflet in 10/2020. Initially, she had an acceptable result with mild to moderate residual mitral regurgitation. However, on follow-up imaging she was demonstrated to have severe mitral regurgitation confirmed by TEE assessment.  She was scheduled for redo mitral transcatheter edge-to-edge repair, but the patient canceled her procedure. She then returned to the office with progressive shortness of breath and orthopnea and decided to proceed with surgery. Erin [name] is now s/p successful TEER with a single MitraClip NTW device, reducing mitral regurgitation from 4+ to 1+. Device placed A2/P2 (lateral to the previously implanted clip device). POD1 echo with prior XTW clip to A2/P2 and new clip NTW lateral to original with mild residual MR lateral to both clips. MVA 1.4 cm2, mean gradient 7, and peak 15 mmHg with a HR 63  Bpm. She was continued on aspirin  daily.    Today the patient presents to clinic for follow up.   Past Medical History:  Diagnosis Date   Achilles tendon rupture    left, no surgery required   Chronic diastolic heart failure (HCC)    Gross hematuria 12/2020   Klebsiella on urine clx x2 but no UTI sx's.  CT showed tiny stones in each renal pelvis  but no ureteral or bladder stones, no mass.  Urol did cysto->normal. Plan is annual UA, rpt hematuria w/u in 3-5 yrs if persistent pos.   History of kidney stones    Hypertension    Hypertensive retinopathy of both eyes    Dr. Lasandra Points   Low back pain    spondylosis, listhesis, +scoliosis at L/S jxn   Lower extremity edema    lasix  prn   OA (osteoarthritis)    Osteopenia    Osteoporosis 07/2021   07/2021 T score -4.6   S/P mitral valve clip implantation 11/06/2020   s/p TEER with one XTW MitraClip on A2P2 by Dr. Arlester Ladd.  DAPT w/ASA and plavix  x 3 mo post procedure recommended.   Severe mitral regurgitation    Mitral valve clip 10/2020.  Clip #25 January 2023   Severe pulmonary hypertension (HCC) 2021   transth echo; moderate by TEE 08/2020   Systolic murmur 07/03/2020   severe mitral regurge, mod/severe tricuspic regurg   Tremor of right hand      Current Medications: No outpatient medications have been marked as taking for the 03/05/24 encounter (Appointment) with Ardia Kraft, PA-C.      ROS:   Please see the history of present illness.    All other systems reviewed and are negative.  EKGs       Risk Assessment/Calculations:   {Does this patient have ATRIAL FIBRILLATION?:[phone]}   {This patient may be at risk for Amyloid. She has one or more dx on the prob list or PMH from the following list -  Abnormal EKG, HFpEF/Diastolic CHF, Aortic Stenosis, LVH, Bilateral Carpal Tunnel Syndrome, Biceps Tendon Rupture, Spinal Stenosis, Pericardial Effusion, Left Atrial Enlargement, Conduction System Disorder. See list below or review PMH.  Diagnoses From Problem List           Noted     Chronic diastolic heart failure (HCC) 03/02/2023    Click HERE to open Cardiac Amyloid Screening SmartSet to order screening OR Click HERE to defer testing for 1 year or permanently :1}    Physical Exam:    VS:  There were no vitals taken for this visit.    Wt Readings from Last 3  Encounters:  12/20/23 115 lb (52.2 kg)  09/12/23 119 lb 3.2 oz (54.1 kg)  08/31/23 122 lb (55.3 kg)     GEN: Well nourished, well developed in no acute distress NECK: No JVD CARDIAC: ***RRR, no murmurs, rubs, gallops RESPIRATORY:  Clear to auscultation without rales, wheezing or rhonchi  ABDOMEN: Soft, non-tender, non-distended EXTREMITIES:  No edema; No deformity.  Groin sites clear without hematoma or ecchymosis. ****  ASSESSMENT:    No diagnosis found.  PLAN:    In order of problems listed above:         {Are you ordering a CV Procedure (e.g. stress test, cath, DCCV, TEE, etc)?   Press F2        :[id number]}     Medication Adjustments/Labs and Tests Ordered: Current medicines are reviewed at length with the patient today.  Concerns regarding medicines are outlined above.  No orders of the defined types were placed in this encounter.  No orders of the defined types were placed in this encounter.   There are no Patient Instructions on file for this visit.   Signed, Abagail Hoar, PA-C  03/02/2024 4:12 PM    [facility]

## 2024-03-05 ENCOUNTER — Ambulatory Visit (HOSPITAL_COMMUNITY): Payer: Medicare Other | Attending: Cardiology

## 2024-03-05 ENCOUNTER — Encounter: Payer: Self-pay | Admitting: Physician Assistant

## 2024-03-05 ENCOUNTER — Ambulatory Visit: Payer: Medicare Other | Admitting: Physician Assistant

## 2024-03-05 VITALS — BP 164/72 | HR 59 | Ht 63.0 in | Wt 122.4 lb

## 2024-03-05 DIAGNOSIS — I5031 Acute diastolic (congestive) heart failure: Secondary | ICD-10-CM | POA: Diagnosis not present

## 2024-03-05 DIAGNOSIS — Z9889 Other specified postprocedural states: Secondary | ICD-10-CM | POA: Diagnosis not present

## 2024-03-05 DIAGNOSIS — Z95818 Presence of other cardiac implants and grafts: Secondary | ICD-10-CM | POA: Diagnosis not present

## 2024-03-05 DIAGNOSIS — I071 Rheumatic tricuspid insufficiency: Secondary | ICD-10-CM | POA: Insufficient documentation

## 2024-03-05 DIAGNOSIS — I340 Nonrheumatic mitral (valve) insufficiency: Secondary | ICD-10-CM | POA: Insufficient documentation

## 2024-03-05 DIAGNOSIS — I5032 Chronic diastolic (congestive) heart failure: Secondary | ICD-10-CM

## 2024-03-05 DIAGNOSIS — I10 Essential (primary) hypertension: Secondary | ICD-10-CM | POA: Diagnosis not present

## 2024-03-05 DIAGNOSIS — I272 Pulmonary hypertension, unspecified: Secondary | ICD-10-CM | POA: Diagnosis not present

## 2024-03-05 LAB — ECHOCARDIOGRAM COMPLETE
Area-P 1/2: 1.67 cm2
MV VTI: 0.83 cm2
S' Lateral: 3.1 cm

## 2024-03-05 LAB — CBC
Hematocrit: 43.6 % (ref 34.0–46.6)
Hemoglobin: 14 g/dL (ref 11.1–15.9)
MCH: 28.9 pg (ref 26.6–33.0)
MCHC: 32.1 g/dL (ref 31.5–35.7)
MCV: 90 fL (ref 79–97)
Platelets: 258 10*3/uL (ref 150–450)
RBC: 4.84 x10E6/uL (ref 3.77–5.28)
RDW: 14.5 % (ref 11.7–15.4)
WBC: 6.5 10*3/uL (ref 3.4–10.8)

## 2024-03-05 MED ORDER — FUROSEMIDE 20 MG PO TABS: 20.0000 mg | ORAL_TABLET | Freq: Every day | ORAL | 3 refills | Status: DC

## 2024-03-05 NOTE — Patient Instructions (Addendum)
 Medication Instructions:  Your physician has recommended you make the following change in your medication:  Increase Lasix  to 20mg  daily  Lab Work: CBC, BNP, CMP -- TODAY  You may go to any [facility] Location for your lab work:  [facility] - [address] ([facility]) - [address] [phone] [address]  [place] - [address]   [place]  - [address]   [place] - [address] - [address] [employer]  - [address] [place] - [address] ([facility]   If you have labs (blood work) drawn today and your tests are completely normal, you will receive your results only by: [employer] (if you have MyChart)  If you have any lab test that is abnormal or we need to change your treatment, we will call you or send a MyChart message to review the results.  Testing/Procedures: None ordered.  Follow-Up: At [facility], you and your health needs are our priority.  As part of our continuing mission to provide you with exceptional heart care, we have created designated Provider Care Teams.  These Care Teams include your primary Cardiologist (physician) and Advanced Practice Providers (APPs -  Physician Assistants and Nurse Practitioners) who all work together to provide you with the care you need, when you need it.  Your next appointment:   As scheduled

## 2024-03-06 ENCOUNTER — Ambulatory Visit: Payer: Self-pay | Admitting: Physician Assistant

## 2024-03-06 LAB — COMPREHENSIVE METABOLIC PANEL WITH GFR
ALT: 17 IU/L (ref 0–32)
AST: 22 IU/L (ref 0–40)
Albumin: 4.4 g/dL (ref 3.7–4.7)
Alkaline Phosphatase: 65 IU/L (ref 44–121)
BUN/Creatinine Ratio: 35 — ABNORMAL HIGH (ref 12–28)
BUN: 23 mg/dL (ref 8–27)
Bilirubin Total: 1 mg/dL (ref 0.0–1.2)
CO2: 23 mmol/L (ref 20–29)
Calcium: 9.7 mg/dL (ref 8.7–10.3)
Chloride: 104 mmol/L (ref 96–106)
Creatinine, Ser: 0.66 mg/dL (ref 0.57–1.00)
Globulin, Total: 1.8 g/dL (ref 1.5–4.5)
Glucose: 99 mg/dL (ref 70–99)
Potassium: 4.4 mmol/L (ref 3.5–5.2)
Sodium: 145 mmol/L — ABNORMAL HIGH (ref 134–144)
Total Protein: 6.2 g/dL (ref 6.0–8.5)
eGFR: 85 mL/min/{1.73_m2} (ref >59)

## 2024-03-06 LAB — PRO B NATRIURETIC PEPTIDE: NT-Pro BNP: 3607 pg/mL — ABNORMAL HIGH (ref 0–738)

## 2024-03-06 MED ORDER — IRBESARTAN 300 MG PO TABS: ORAL_TABLET | ORAL | 3 refills | Status: DC

## 2024-04-15 NOTE — Progress Notes (Signed)
 " Cardiology Office Note:  .   Date:  04/16/2024  ID:  Erin [name], DOB Apr 06, 1935, MRN [id number] PCP: Candise Aleene DEL, MD  [facility] Providers Cardiologist:  Gordy Bergamo, MD Structural Heart:  Ozell Fell, MD  History of Present Illness: .   Erin [name] is a 89 y.o.  female with a hx of HTN, OA, chronic diastolic CHF, severe pulmonary HTN, moderate TR and severe MR s/p mTEER (10/2020, 02/2023) who presents to clinic for follow up.  She has no supracardiac scratch that she has no significant coronary disease by cardiac catheterization in 2021.  Seen by structural heart disease team on 03/13/2024, appointment made for follow-up of acute on chronic diastolic heart failure.  She is very active, states that she is doing well and has not had any significant leg edema except right ankle edema.  States that she did not want to take furosemide  that was prescribed a month ago on a daily basis and has hardly used it and she is aware to use potassium with the furosemide .  She is asymptomatic otherwise. Patient is new to me, establishing from Dr. Esmeralda Sharps who retired.  Patient's family members do see me.  Discussed the use of AI scribe software for clinical note transcription with the patient, who gave verbal consent to proceed.  History of Present Illness Erin [name] is an 89 year old female with chronic diastolic heart failure who presents with leg swelling and blood pressure management issues. She was referred by Dr. Sharps for continued cardiovascular care.  She experiences leg swelling, particularly in her foot, and manages it with furosemide  as needed. She does not take it daily. Her blood pressure management includes hydralazine  as needed when readings exceed 180 mmHg. She monitors her blood pressure regularly, with recent readings of 134/75 mmHg and 159/93 mmHg, and adjusts her medication accordingly. She experienced a syncopal episode in February, attributed to a sudden drop in  blood pressure after taking hydralazine . She was informed of a severe leak on the right side of her heart but has not experienced significant symptoms related to this.  Labs    Lab Results  Component Value Date   NA 145 (H) 03/05/2024   K 4.4 03/05/2024   CO2 23 03/05/2024   GLUCOSE 99 03/05/2024   BUN 23 03/05/2024   CREATININE 0.66 03/05/2024   CALCIUM 9.7 03/05/2024   GFR 81.05 05/26/2023   EGFR 85 03/05/2024   GFRNONAA >60 12/20/2023      Latest Ref Rng & Units 03/05/2024   11:33 AM 12/20/2023    2:53 PM 05/26/2023   10:56 AM  BMP  Glucose 70 - 99 mg/dL 99  98  94   BUN 8 - 27 mg/dL 23  28  23    Creatinine 0.57 - 1.00 mg/dL 9.33  9.52  9.38   BUN/Creat Ratio 12 - 28 35     Sodium 134 - 144 mmol/L 145  139  140   Potassium 3.5 - 5.2 mmol/L 4.4  4.3  4.5   Chloride 96 - 106 mmol/L 104  103  103   CO2 20 - 29 mmol/L 23  27  30    Calcium 8.7 - 10.3 mg/dL 9.7  9.2  8.7       Latest Ref Rng & Units 03/05/2024   11:38 AM 12/20/2023    2:53 PM 03/03/2023   12:40 AM  CBC  WBC 3.4 - 10.8 x10E3/uL 6.5  9.6  9.5  Hemoglobin 11.1 - 15.9 g/dL 85.9  87.3  87.8   Hematocrit 34.0 - 46.6 % 43.6  39.7  39.0   Platelets 150 - 450 x10E3/uL 258  228  225    No results found for: HGBA1C  Lab Results  Component Value Date   TSH 2.37 07/02/2020    ROS  Review of Systems  Cardiovascular:  Positive for leg swelling (occasional). Negative for chest pain and dyspnea on exertion.    Physical Exam:   VS:  BP 124/69 (BP Location: Left Arm, Patient Position: Sitting, Cuff Size: Normal)   Pulse (!) 59   Resp 16   Ht 5' 3 (1.6 m)   Wt 115 lb (52.2 kg)   SpO2 97%   BMI 20.37 kg/m    Wt Readings from Last 3 Encounters:  04/16/24 115 lb (52.2 kg)  03/05/24 122 lb 6.4 oz (55.5 kg)  12/20/23 115 lb (52.2 kg)    Physical Exam Neck:     Vascular: No JVD.   Cardiovascular:     Rate and Rhythm: Normal rate and regular rhythm.     Pulses: Intact distal pulses.     Heart sounds: S1  normal and S2 normal. Murmur heard.     Early systolic murmur is present with a grade of 1/6 at the lower right sternal border.     No gallop.  Pulmonary:     Effort: Pulmonary effort is normal.     Breath sounds: Normal breath sounds.  Abdominal:     General: Bowel sounds are normal.     Palpations: Abdomen is soft.   Musculoskeletal:     Right lower leg: No edema.     Left lower leg: No edema.     ECHOCARDIOGRAM COMPLETE 03/05/2024  1. Left ventricular ejection fraction, by estimation, is 55 to 60%. The left ventricle has normal function. Left ventricular diastolic parameters are consistent with Grade I diastolic dysfunction (impaired relaxation). Elevated left atrial pressure. 2. Right ventricular systolic function mild to moderately. The right ventricular size is moderately enlarged. There is severely elevated pulmonary artery systolic pressure. 3. Small shunt seen by color doppler with L to R flow. Evidence of atrial level shunting detected by color flow Doppler. 4. Right atrial size was severely dilated. 5. The mitral valve has been repaired/replaced. Mild mitral valve regurgitation. There are Mitra-Clips present in the mitral position. Procedure Date: Jan 2022, 03/02/2023. 6. Tricuspid valve regurgitation is severe. 7. The inferior vena cava is dilated in size with <50% respiratory variability, suggesting right atrial pressure of 15 mmHg. 8. Left atrial size was moderate to severely.  EKG:         Medications ordered    Meds ordered this encounter  Medications   spironolactone  (ALDACTONE ) 25 MG tablet    Sig: Take 0.5 tablets (12.5 mg total) by mouth daily.    Dispense:  30 tablet    Refill:  2     ASSESSMENT AND PLAN: .      ICD-10-CM   1. Chronic diastolic heart failure (HCC)  P49.67 spironolactone  (ALDACTONE ) 25 MG tablet    Basic Metabolic Panel (BMET)    2. Essential hypertension  I10 spironolactone  (ALDACTONE ) 25 MG tablet    Basic Metabolic Panel (BMET)     3. S/P mitral valve clip implantation  Z98.890    Z95.818     4. Severe tricuspid regurgitation  I07.1 Basic Metabolic Panel (BMET)     Assessment & Plan Chronic diastolic heart failure Chronic  diastolic heart failure with minimal edema and clear lungs, attributed to valvular issues and hypertension. - Initiate spironolactone  12.5 mg daily. - Reassess blood pressure in one month. - Increase to 25 mg daily if blood pressure exceeds 150 mmHg after one week.  Tricuspid valve regurgitation Severe tricuspid valve regurgitation identified on echocardiogram, currently asymptomatic and not clinically significant. Conservative management is appropriate. - Continue conservative management. - No clinical evidence of heart failure, no JVD, murmur is very soft, no leg edema.  No hepatomegaly. - Could consider sildenafil 20 mg 3 times daily for pulmonary hypertension.  Fortunately no change in her symptoms or echocardiogram since May 2024 compared to the latest echocardiogram on 03/05/2024.  Hypertension Hypertension with variable blood pressure readings. Current regimen includes hydralazine  as needed and spironolactone  for enhanced control. She is advised to monitor blood pressure weekly or if symptomatic. - Initiate spironolactone  12.5 mg daily. - Increase to 25 mg daily if blood pressure exceeds 150 mmHg after one week. - Monitor blood pressure weekly or if symptomatic. - Office visit in 6 to 8 weeks for follow-up.   Signed,  Gordy Bergamo, MD, [facility] 04/16/2024, 10:17 AM [facility] [address] [place], [place] [address] Phone: [phone]. Fax:  [phone]  "

## 2024-04-16 ENCOUNTER — Other Ambulatory Visit: Payer: Self-pay | Admitting: *Deleted

## 2024-04-16 ENCOUNTER — Ambulatory Visit: Attending: Cardiology | Admitting: Cardiology

## 2024-04-16 ENCOUNTER — Encounter: Payer: Self-pay | Admitting: Cardiology

## 2024-04-16 VITALS — BP 124/69 | HR 59 | Resp 16 | Ht 63.0 in | Wt 115.0 lb

## 2024-04-16 DIAGNOSIS — I071 Rheumatic tricuspid insufficiency: Secondary | ICD-10-CM | POA: Insufficient documentation

## 2024-04-16 DIAGNOSIS — I10 Essential (primary) hypertension: Secondary | ICD-10-CM | POA: Diagnosis not present

## 2024-04-16 DIAGNOSIS — I5032 Chronic diastolic (congestive) heart failure: Secondary | ICD-10-CM | POA: Insufficient documentation

## 2024-04-16 DIAGNOSIS — Z95818 Presence of other cardiac implants and grafts: Secondary | ICD-10-CM | POA: Insufficient documentation

## 2024-04-16 DIAGNOSIS — I5033 Acute on chronic diastolic (congestive) heart failure: Secondary | ICD-10-CM

## 2024-04-16 DIAGNOSIS — Z9889 Other specified postprocedural states: Secondary | ICD-10-CM | POA: Diagnosis not present

## 2024-04-16 MED ORDER — SPIRONOLACTONE 25 MG PO TABS: 12.5000 mg | ORAL_TABLET | Freq: Every day | ORAL | 2 refills | Status: DC

## 2024-04-16 NOTE — Patient Instructions (Addendum)
 Medication Instructions:  Your physician has recommended you make the following change in your medication: Start spironolactone  12.5 mg by mouth daily.  If systolic blood pressure running >150 increase to 25 mg by mouth daily and let our office know  *If you need a refill on your cardiac medications before your next appointment, please call your pharmacy*  Lab Work: Have lab work checked in one month (BMP).  This is not fasting.  Can be done at any [facility].  There is a [facility] on the first floor of our building If you have labs (blood work) drawn today and your tests are completely normal, you will receive your results only by: MyChart Message (if you have MyChart) OR A paper copy in the mail If you have any lab test that is abnormal or we need to change your treatment, we will call you to review the results.  Testing/Procedures: none  Follow-Up: At [facility], you and your health needs are our priority.  As part of our continuing mission to provide you with exceptional heart care, our providers are all part of one team.  This team includes your primary Cardiologist (physician) and Advanced Practice Providers or APPs (Physician Assistants and Nurse Practitioners) who all work together to provide you with the care you need, when you need it.  Your next appointment:   August 27 at 11 AM   Provider:   Gordy Bergamo, MD    We recommend signing up for the patient portal called MyChart.  Sign up information is provided on this After Visit Summary.  MyChart is used to connect with patients for Virtual Visits (Telemedicine).  Patients are able to view lab/test results, encounter notes, upcoming appointments, etc.  Non-urgent messages can be sent to your provider as well.   To learn more about what you can do with MyChart, go to [email or web address].   Other Instructions

## 2024-04-19 ENCOUNTER — Telehealth: Payer: Self-pay | Admitting: Cardiology

## 2024-04-19 NOTE — Telephone Encounter (Signed)
 Should pt take 1/2 tab or whole tab  of the Spironolactone ?

## 2024-04-19 NOTE — Telephone Encounter (Signed)
 She can take a whole tablet once a day

## 2024-04-19 NOTE — Telephone Encounter (Signed)
 Medication started 3 days ago and will have to wait for a couple weeks. Not to check BP so often

## 2024-04-19 NOTE — Telephone Encounter (Signed)
 Spoke with pt and B/P yesterday was running 151-157/80-86  until last night and jumped to 181/97 at 7 pm Per pt took whole tab of Spironolactone  this am at 8 am and at 10:30 b/p was 165/93 Will forward to Dr Ladona for review and recommendations

## 2024-04-19 NOTE — Telephone Encounter (Signed)
 Pt c/o BP issue: STAT if pt c/o blurred vision, one-sided weakness or slurred speech.  STAT if BP is GREATER than 180/120 TODAY.  STAT if BP is LESS than 90/60 and SYMPTOMATIC TODAY  1. What is your BP concern?   Patient is concerned about her BP medication   2. Have you taken any BP medication today?  Yes - a whole pill  3. What are your last 5 BP readings?   158/79 157/86 181/97 (last night) 165/93 (today, 10:30 am, after medication)  4. Are you having any other symptoms (ex. Dizziness, headache, blurred vision, passed out)?  No   Patient wants to know next steps as her BP is at 165/93 and she has already taken her BP medication today.

## 2024-04-20 NOTE — Telephone Encounter (Signed)
 Spoke with patient and she is aware to take whole tablet of spironolactone  per provider . She verbalized understanding

## 2024-05-10 ENCOUNTER — Telehealth: Payer: Self-pay | Admitting: Cardiology

## 2024-05-10 DIAGNOSIS — I5032 Chronic diastolic (congestive) heart failure: Secondary | ICD-10-CM

## 2024-05-10 DIAGNOSIS — I10 Essential (primary) hypertension: Secondary | ICD-10-CM

## 2024-05-10 MED ORDER — SPIRONOLACTONE 25 MG PO TABS: 12.5000 mg | ORAL_TABLET | Freq: Every day | ORAL | 3 refills | Status: DC

## 2024-05-10 NOTE — Telephone Encounter (Signed)
 RX sent to requested Pharmacy

## 2024-05-10 NOTE — Telephone Encounter (Signed)
*  STAT* If patient is at the pharmacy, call can be transferred to refill team.   1. Which medications need to be refilled? (please list name of each medication and dose if known) spironolactone  (ALDACTONE ) 25 MG tablet    2. Would you like to learn more about the convenience, safety, & potential cost savings by using the [facility]?      3. Are you open to using the [facility] (Type [facility]. ).   4. Which pharmacy/location (including street and city if local pharmacy) is medication to be sent to? [id number] - [place], [place] - [address]    5. Do they need a 30 day or 90 day supply? 30 day

## 2024-05-11 ENCOUNTER — Telehealth: Payer: Self-pay | Admitting: Cardiology

## 2024-05-11 ENCOUNTER — Other Ambulatory Visit: Payer: Self-pay

## 2024-05-11 DIAGNOSIS — I10 Essential (primary) hypertension: Secondary | ICD-10-CM

## 2024-05-11 DIAGNOSIS — I5032 Chronic diastolic (congestive) heart failure: Secondary | ICD-10-CM

## 2024-05-11 MED ORDER — SPIRONOLACTONE 25 MG PO TABS: 25.0000 mg | ORAL_TABLET | Freq: Every day | ORAL | 3 refills | Status: DC

## 2024-05-11 NOTE — Telephone Encounter (Signed)
 Pt c/o medication issue:  1. Name of Medication:   spironolactone  (ALDACTONE ) 25 MG tablet   2. How are you currently taking this medication (dosage and times per day)?   3. Are you having a reaction (difficulty breathing--STAT)?   4. What is your medication issue?   Patient stated she will need a new prescription for this medication showing she is now taking 1 whole tablet daily sent to [facility] [address] [place] - [address] as her dosage was increased.

## 2024-05-15 DIAGNOSIS — I5032 Chronic diastolic (congestive) heart failure: Secondary | ICD-10-CM | POA: Diagnosis not present

## 2024-05-15 DIAGNOSIS — I071 Rheumatic tricuspid insufficiency: Secondary | ICD-10-CM | POA: Diagnosis not present

## 2024-05-15 DIAGNOSIS — I10 Essential (primary) hypertension: Secondary | ICD-10-CM | POA: Diagnosis not present

## 2024-05-15 LAB — BASIC METABOLIC PANEL WITH GFR
BUN/Creatinine Ratio: 24 (ref 12–28)
BUN: 19 mg/dL (ref 8–27)
CO2: 24 mmol/L (ref 20–29)
Calcium: 9.5 mg/dL (ref 8.7–10.3)
Chloride: 101 mmol/L (ref 96–106)
Creatinine, Ser: 0.78 mg/dL (ref 0.57–1.00)
Glucose: 94 mg/dL (ref 70–99)
Potassium: 4.4 mmol/L (ref 3.5–5.2)
Sodium: 142 mmol/L (ref 134–144)
eGFR: 73 mL/min/1.73 (ref >59)

## 2024-05-16 ENCOUNTER — Ambulatory Visit: Payer: Self-pay | Admitting: Cardiology

## 2024-05-18 DIAGNOSIS — L909 Atrophic disorder of skin, unspecified: Secondary | ICD-10-CM | POA: Diagnosis not present

## 2024-05-18 DIAGNOSIS — M79672 Pain in left foot: Secondary | ICD-10-CM | POA: Diagnosis not present

## 2024-05-18 DIAGNOSIS — M2041 Other hammer toe(s) (acquired), right foot: Secondary | ICD-10-CM | POA: Diagnosis not present

## 2024-05-18 DIAGNOSIS — M2042 Other hammer toe(s) (acquired), left foot: Secondary | ICD-10-CM | POA: Diagnosis not present

## 2024-05-18 DIAGNOSIS — M79671 Pain in right foot: Secondary | ICD-10-CM | POA: Diagnosis not present

## 2024-06-13 ENCOUNTER — Encounter (INDEPENDENT_AMBULATORY_CARE_PROVIDER_SITE_OTHER): Payer: Medicare Other | Admitting: Ophthalmology

## 2024-06-13 DIAGNOSIS — H43813 Vitreous degeneration, bilateral: Secondary | ICD-10-CM | POA: Diagnosis not present

## 2024-06-13 DIAGNOSIS — H35033 Hypertensive retinopathy, bilateral: Secondary | ICD-10-CM | POA: Diagnosis not present

## 2024-06-13 DIAGNOSIS — I10 Essential (primary) hypertension: Secondary | ICD-10-CM

## 2024-06-13 DIAGNOSIS — H348312 Tributary (branch) retinal vein occlusion, right eye, stable: Secondary | ICD-10-CM

## 2024-06-20 ENCOUNTER — Ambulatory Visit: Attending: Cardiology | Admitting: Cardiology

## 2024-06-20 ENCOUNTER — Encounter: Payer: Self-pay | Admitting: Cardiology

## 2024-06-20 VITALS — BP 118/70 | HR 67 | Resp 16 | Ht 63.0 in | Wt 115.6 lb

## 2024-06-20 DIAGNOSIS — I5032 Chronic diastolic (congestive) heart failure: Secondary | ICD-10-CM | POA: Insufficient documentation

## 2024-06-20 DIAGNOSIS — Z95818 Presence of other cardiac implants and grafts: Secondary | ICD-10-CM | POA: Diagnosis not present

## 2024-06-20 DIAGNOSIS — I10 Essential (primary) hypertension: Secondary | ICD-10-CM | POA: Diagnosis not present

## 2024-06-20 DIAGNOSIS — Z9889 Other specified postprocedural states: Secondary | ICD-10-CM | POA: Diagnosis not present

## 2024-06-20 DIAGNOSIS — Z2989 Encounter for other specified prophylactic measures: Secondary | ICD-10-CM | POA: Diagnosis not present

## 2024-06-20 MED ORDER — NITROGLYCERIN 0.2 MG/HR TD PT24: MEDICATED_PATCH | TRANSDERMAL | 12 refills | Status: AC

## 2024-06-20 MED ORDER — SPIRONOLACTONE 25 MG PO TABS
25.0000 mg | ORAL_TABLET | Freq: Every day | ORAL | 3 refills | Status: AC
Start: 2024-06-20 — End: ?

## 2024-06-20 MED ORDER — AMOXICILLIN 500 MG PO CAPS: ORAL_CAPSULE | ORAL | 3 refills | Status: DC

## 2024-06-20 MED ORDER — AMOXICILLIN 500 MG PO TABS
ORAL_TABLET | ORAL | 3 refills | Status: DC
Start: 2024-06-20 — End: 2024-08-22

## 2024-06-20 NOTE — Progress Notes (Signed)
 Cardiology Office Note:  .   Date:  06/20/2024  ID:  Erin [name], DOB 10-08-1935, MRN [id number] PCP: Candise Aleene DEL, MD  [facility] Providers Cardiologist:  Gordy Bergamo, MD Structural Heart:  Ozell Fell, MD  History of Present Illness: .   Erin [name] is a 89 y.o.     Erin [name] is a 89 y.o.  female with a hx of HTN, OA, chronic diastolic CHF, severe pulmonary HTN, moderate TR and severe MR s/p mTEER (10/2020, 02/2023) who presents to clinic for follow up.  She has had 1 acute decompensated diastolic heart failure on 03/13/2024 but otherwise has remained stable and remains asymptomatic.  She established with me after Dr. Esmeralda Sharps retired and I saw her on 04/16/2024.  I had started her on spironolactone  for chronic diastolic heart failure which she is tolerating.  She did not want to take furosemide  that was prescribed previously.  Echocardiogram 03/06/2023 revealing normal LVEF, small interatrial shunting, severely dilated atria.  Mitral clips were present procedure date January 2022 and May 2024.  Tricuspid regurgitation was severe.  I had started her on spironolactone , which she is tolerating and states that her leg edema is completely resolved.  Except for chronic cough she has no specific complaints.  She has noticed some fluctuation in blood pressure and takes hydralazine  as needed.  Cardiac Studies relevent.        Discussed the use of AI scribe software for clinical note transcription with the patient, who gave verbal consent to proceed.  History of Present Illness Erin [name] is an 89 year old female with hypertension who presents with fluctuating blood pressure and persistent cough.  Her blood pressure fluctuates throughout the day, often elevated in the morning with readings such as 179/95 mmHg, despite adherence to her medication regimen. She takes carvedilol  and hydralazine , adjusting the hydralazine  dose as needed based on her blood pressure  readings. Her medication regimen also includes a nitroglycerin  patch and a diuretic.  She has a persistent dry cough since her last procedure at [place], occurring day and night, without associated dyspnea. She frequently clears her chest to breathe comfortably. She is concerned about the cough's potential causes.  Labs   Lab Results  Component Value Date   CHOL 139 07/02/2020   HDL 80.30 07/02/2020   LDLCALC 53 07/02/2020   TRIG 30.0 07/02/2020   CHOLHDL 2 07/02/2020   No results found for: LIPOA  Recent Labs    12/20/23 1453 03/05/24 1133 05/15/24 0939  NA 139 145* 142  K 4.3 4.4 4.4  CL 103 104 101  CO2 27 23 24   GLUCOSE 98 99 94  BUN 28* 23 19  CREATININE 0.47 0.66 0.78  CALCIUM 9.2 9.7 9.5  GFRNONAA >60  --   --     Lab Results  Component Value Date   ALT 17 03/05/2024   AST 22 03/05/2024   ALKPHOS 65 03/05/2024   BILITOT 1.0 03/05/2024      Latest Ref Rng & Units 03/05/2024   11:38 AM 12/20/2023    2:53 PM 03/03/2023   12:40 AM  CBC  WBC 3.4 - 10.8 x10E3/uL 6.5  9.6  9.5   Hemoglobin 11.1 - 15.9 g/dL 85.9  87.3  87.8   Hematocrit 34.0 - 46.6 % 43.6  39.7  39.0   Platelets 150 - 450 x10E3/uL 258  228  225    No results found for: HGBA1C  Lab Results  Component  Value Date   TSH 2.37 07/02/2020     ROS  Review of Systems  Cardiovascular:  Negative for chest pain, dyspnea on exertion and leg swelling.  Respiratory:  Positive for cough (chronic).    Physical Exam:   VS:  BP 118/70 (BP Location: Left Arm, Patient Position: Sitting, Cuff Size: Normal)   Pulse 67   Resp 16   Ht 5' 3 (1.6 m)   Wt 115 lb 9.6 oz (52.4 kg)   SpO2 98%   BMI 20.48 kg/m    Wt Readings from Last 3 Encounters:  06/20/24 115 lb 9.6 oz (52.4 kg)  04/16/24 115 lb (52.2 kg)  03/05/24 122 lb 6.4 oz (55.5 kg)    BP Readings from Last 3 Encounters:  06/20/24 118/70  04/16/24 124/69  03/05/24 (!) 164/72   Physical Exam Neck:     Vascular: No carotid bruit or JVD.   Cardiovascular:     Rate and Rhythm: Normal rate and regular rhythm.     Pulses: Intact distal pulses.     Heart sounds: Normal heart sounds. No murmur heard.    No gallop.  Pulmonary:     Effort: Pulmonary effort is normal.     Breath sounds: Normal breath sounds.  Abdominal:     General: Bowel sounds are normal.     Palpations: Abdomen is soft.  Musculoskeletal:     Right lower leg: No edema.     Left lower leg: No edema.    EKG:         ASSESSMENT AND PLAN: .      ICD-10-CM   1. Chronic diastolic heart failure (HCC)  P49.67 carvedilol  (COREG ) 25 MG tablet    nitroGLYCERIN  (NITRODUR - DOSED IN MG/24 HR) 0.2 mg/hr patch    spironolactone  (ALDACTONE ) 25 MG tablet    2. Essential hypertension  I10 carvedilol  (COREG ) 25 MG tablet    spironolactone  (ALDACTONE ) 25 MG tablet    3. S/P mitral valve clip implantation  Z98.890    Z95.818     4. Need for SBE (subacute bacterial endocarditis) prophylaxis  Z29.89      Assessment & Plan Essential hypertension Blood pressure fluctuates, with reports of high readings at home, such as 179/95 mmHg. In-office blood pressure is within normal range, suggesting possible inaccuracy of home blood pressure monitor. Current medication regimen includes carvedilol  and hydralazine , which are deemed appropriate and effective. - Continue current antihypertensive regimen with carvedilol  25 mg p.o. twice daily, spironolactone  25 mg daily, Nitro-Dur  0.2 mg/h patch and takes hydralazine  only as needed basis  - Reassure that some blood pressure variation is normal. The above medications are also treating her chronic diastolic heart failure-.  Chronic cough likely due to sinus drainage Persistent dry cough present since last procedure, occurring throughout the day and night. No associated shortness of breath. Cough is suspected to be due to sinus drainage rather than cardiac issues.  Gastroesophageal reflux disease (GERD) GERD managed with Prilosec.  She reports taking medication as prescribed. - Refill Prilosec prescription per her PCP.  History of mitral valve clip, residual severe tricuspid regurgitation by echocardiogram in May 2025. On auscultation she has no murmur heard.  Suspect with treatment of severe MR, her tricuspid digitation has improved.  There is no clinical evidence of heart failure.  In view of the fact that she is well compensated, she is asymptomatic, I did not order repeat echocardiogram. - Needs endocarditis prophylaxis. Follow-Up Plan for follow-up and medication management. - Schedule  follow-up appointment in six months. - Send nitroglycerin  patch prescription to pharmacy. - Send message to Dr. Helon to refill Prilosec.   Follow up: 6 months.  Signed,  Gordy Bergamo, MD, [facility] 06/20/2024, 11:36 AM [facility] [address] [place], [place] [address] Phone: [phone]. Fax:  [phone]

## 2024-06-20 NOTE — Patient Instructions (Signed)

## 2024-06-25 ENCOUNTER — Other Ambulatory Visit: Payer: Self-pay | Admitting: Family Medicine

## 2024-07-30 NOTE — Telephone Encounter (Signed)
 No further action needed.

## 2024-08-01 ENCOUNTER — Other Ambulatory Visit: Payer: Self-pay | Admitting: Family Medicine

## 2024-08-01 ENCOUNTER — Telehealth: Payer: Self-pay

## 2024-08-01 DIAGNOSIS — I10 Essential (primary) hypertension: Secondary | ICD-10-CM

## 2024-08-01 DIAGNOSIS — I5032 Chronic diastolic (congestive) heart failure: Secondary | ICD-10-CM

## 2024-08-01 NOTE — Telephone Encounter (Signed)
 Copied from CRM [id number]. Topic: Clinical - Request for Lab/Test Order >> Aug 01, 2024 12:41 PM [name] wrote: Reason for CRM: Patient daughter called to request routine labs be ordered prior to upcoming visit on 08/22/24 for patient. Please contact patient to schedule lab appt once orders are placed.

## 2024-08-01 NOTE — Telephone Encounter (Signed)
 Copied from CRM [id number]. Topic: Clinical - Medication Refill >> Aug 01, 2024 12:48 PM [name] wrote: Medication: carvedilol  (COREG ) 25 MG tablet omeprazole  (PRILOSEC) 40 MG capsule  Has the patient contacted their pharmacy? Yes   This is the patient's preferred pharmacy:  [facility] [address] [place] - [address] [place] [address] Phone: [phone] Fax: [phone]  Is this the correct pharmacy for this prescription? Yes If no, delete pharmacy and type the correct one.   Has the prescription been filled recently? No  Is the patient out of the medication? Yes  Has the patient been seen for an appointment in the last year OR does the patient have an upcoming appointment? Yes  Can we respond through MyChart? Yes  Agent: Please be advised that Rx refills may take up to 3 business days. We ask that you follow-up with your pharmacy.

## 2024-08-01 NOTE — Telephone Encounter (Signed)
 Caller is not on DPR. Labs will be drawn if needed at appt

## 2024-08-22 ENCOUNTER — Ambulatory Visit: Payer: Self-pay | Admitting: Family Medicine

## 2024-08-22 ENCOUNTER — Encounter: Payer: Self-pay | Admitting: Family Medicine

## 2024-08-22 ENCOUNTER — Ambulatory Visit (INDEPENDENT_AMBULATORY_CARE_PROVIDER_SITE_OTHER): Admitting: Family Medicine

## 2024-08-22 VITALS — BP 147/71 | HR 55 | Temp 97.3°F | Ht 63.0 in | Wt 117.4 lb

## 2024-08-22 DIAGNOSIS — R0989 Other specified symptoms and signs involving the circulatory and respiratory systems: Secondary | ICD-10-CM

## 2024-08-22 DIAGNOSIS — I5032 Chronic diastolic (congestive) heart failure: Secondary | ICD-10-CM

## 2024-08-22 DIAGNOSIS — E559 Vitamin D deficiency, unspecified: Secondary | ICD-10-CM

## 2024-08-22 DIAGNOSIS — K219 Gastro-esophageal reflux disease without esophagitis: Secondary | ICD-10-CM

## 2024-08-22 DIAGNOSIS — I10 Essential (primary) hypertension: Secondary | ICD-10-CM

## 2024-08-22 DIAGNOSIS — M810 Age-related osteoporosis without current pathological fracture: Secondary | ICD-10-CM | POA: Diagnosis not present

## 2024-08-22 LAB — BASIC METABOLIC PANEL WITH GFR
BUN: 24 mg/dL — ABNORMAL HIGH (ref 6–23)
CO2: 33 meq/L — ABNORMAL HIGH (ref 19–32)
Calcium: 9.3 mg/dL (ref 8.4–10.5)
Chloride: 106 meq/L (ref 96–112)
Creatinine, Ser: 0.67 mg/dL (ref 0.40–1.20)
GFR: 78.55 mL/min (ref >60.00)
Glucose, Bld: 111 mg/dL — ABNORMAL HIGH (ref 70–99)
Potassium: 4 meq/L (ref 3.5–5.1)
Sodium: 144 meq/L (ref 135–145)

## 2024-08-22 LAB — VITAMIN D 25 HYDROXY (VIT D DEFICIENCY, FRACTURES): VITD: 54.64 ng/mL (ref 30.00–100.00)

## 2024-08-22 MED ORDER — AMOXICILLIN 500 MG PO TABS: ORAL_TABLET | ORAL | 3 refills | Status: AC

## 2024-08-22 MED ORDER — IBANDRONATE SODIUM 150 MG PO TABS: 150.0000 mg | ORAL_TABLET | ORAL | 3 refills | Status: AC

## 2024-08-22 MED ORDER — CARVEDILOL 25 MG PO TABS: 25.0000 mg | ORAL_TABLET | Freq: Two times a day (BID) | ORAL | 3 refills | Status: AC

## 2024-08-22 MED ORDER — OMEPRAZOLE 40 MG PO CPDR: 40.0000 mg | DELAYED_RELEASE_CAPSULE | Freq: Every day | ORAL | 3 refills | Status: DC

## 2024-08-22 NOTE — Progress Notes (Signed)
 OFFICE VISIT  08/22/2024  CC:  Chief Complaint  Patient presents with   Medical Management of Chronic Issues    Pt declines further bone density screenings.     Patient is a 89 y.o. female who presents for follow-up labile hypertension, osteoporosis, vitamin D  deficiency, and history of chronic right SI joint pain. I last saw her on 09/12/2023. A/P as of that visit: #1 labile hypertension. Good control on current regimen: Continue hydralazine  25 mg twice daily scheduled and she may take a half dose PRN significant elevation of her blood pressure.  Continue carvedilol  25 mg, one half tab every morning and 1 tab every afternoon as well as irbesartan  300 mg a day. Electrolytes and creatinine normal 3 months ago.   #2 right SI joint dysfunction/pain. She got a moderate amount of improvement with SI joint steroid injection 2 weeks ago.  INTERIM HX: Alysabeth still notes intermittent significant elevations of blood pressure at home.  She takes an extra dose of hydralazine  an average of a couple days a week. Current BP regimen is Coreg  25 mg twice daily and half of the 25 mg hydralazine  once a day and spironolactone  25 mg a day.  Her reflux is bad and that she takes omeprazole  40 mg a day.  She has been out of this recently.  She has chronic low back pain and right SI joint pain, mild to moderate intensity.  No lower extremity weakness.  ROS as above, plus--> she has some shortness of breath with things like walking across a parking lot to her car but when she stops it takes only a couple of breaths to return to normal. No fevers, no CP, no wheezing, no cough, no dizziness, no HAs, no rashes, no melena/hematochezia.  No polyuria or polydipsia.    No focal weakness, paresthesias, or tremors.  No acute vision or hearing abnormalities.  No dysuria or unusual/new urinary urgency or frequency.  No recent changes in lower legs. No n/v/d or abd pain.  No palpitations.    Past Medical History:   Diagnosis Date   Achilles tendon rupture    left, no surgery required   Chronic diastolic heart failure (HCC)    Gross hematuria 12/2020   Klebsiella on urine clx x2 but no UTI sx's.  CT showed tiny stones in each renal pelvis but no ureteral or bladder stones, no mass.  Urol did cysto->normal. Plan is annual UA, rpt hematuria w/u in 3-5 yrs if persistent pos.   History of kidney stones    Hypertension    Hypertensive retinopathy of both eyes    Dr. cleatus   Low back pain    spondylosis, listhesis, +scoliosis at L/S jxn   Lower extremity edema    lasix  prn   OA (osteoarthritis)    Osteopenia    Osteoporosis 07/2021   07/2021 T score -4.6   S/P mitral valve clip implantation 11/06/2020   s/p TEER with one XTW MitraClip on A2P2 by Dr. Wonda.  DAPT w/ASA and plavix  x 3 mo post procedure recommended.   Severe mitral regurgitation    Mitral valve clip 10/2020.  Clip #25 January 2023   Severe pulmonary hypertension (HCC) 2021   transth echo; moderate by TEE 08/2020   Systolic murmur 07/03/2020   severe mitral regurge, mod/severe tricuspic regurg   Tremor of right hand     Past Surgical History:  Procedure Laterality Date   APPENDECTOMY     89 yrs old   BACK SURGERY  2004   ruptured disc   CHOLECYSTECTOMY     age 74   CYSTOSCOPY  02/24/2021   (for gross hematuria) ->normal.   DEXA  07/2021   2022 T score -4.6 (radius)   LUMBAR LAMINECTOMY  2004   RIGHT/LEFT HEART CATH AND CORONARY ANGIOGRAPHY N/A 09/16/2020   No CAD. Procedure: RIGHT/LEFT HEART CATH AND CORONARY ANGIOGRAPHY;  Surgeon: Claudene Victory ORN, MD;  Location: [facility] INVASIVE CV LAB;  Service: Cardiovascular;  Laterality: N/A;   TEE WITHOUT CARDIOVERSION N/A 09/16/2020   Procedure: TRANSESOPHAGEAL ECHOCARDIOGRAM (TEE);  Surgeon: Lonni Slain, MD;  Location: [facility] ENDOSCOPY;  Service: Cardiovascular;  Laterality: N/A;   TEE WITHOUT CARDIOVERSION N/A 11/06/2020   Procedure: TRANSESOPHAGEAL ECHOCARDIOGRAM (TEE);  Surgeon:  Wonda Sharper, MD;  Location: [facility] INVASIVE CV LAB;  Service: Cardiovascular;  Laterality: N/A;   TEE WITHOUT CARDIOVERSION N/A 06/16/2022   Procedure: TRANSESOPHAGEAL ECHOCARDIOGRAM (TEE);  Surgeon: Francyne Headland, MD;  Location: [facility] ENDOSCOPY;  Service: Cardiovascular;  Laterality: N/A;   TEE WITHOUT CARDIOVERSION N/A 03/02/2023   Procedure: TRANSESOPHAGEAL ECHOCARDIOGRAM;  Surgeon: Wonda Sharper, MD;  Location: [facility] INVASIVE CV LAB;  Service: Cardiovascular;  Laterality: N/A;   TONSILLECTOMY     age 40   TOTAL KNEE ARTHROPLASTY Left 06/02/2021   Procedure: LEFT TOTAL KNEE ARTHROPLASTY;  Surgeon: Beverley Evalene BIRCH, MD;  Location: [facility] ORS;  Service: Orthopedics;  Laterality: Left;   TRANSCATHETER MITRAL EDGE TO EDGE REPAIR N/A 11/06/2020   Procedure: MITRAL VALVE REPAIR;  Surgeon: Wonda Sharper, MD;  Location: [facility] INVASIVE CV LAB;  Service: Cardiovascular;  Laterality: N/A;   TRANSCATHETER MITRAL EDGE TO EDGE REPAIR N/A 03/02/2023   Procedure: MITRAL VALVE REPAIR;  Surgeon: Wonda Sharper, MD;  Location: [facility] INVASIVE CV LAB;  Service: Cardiovascular;  Laterality: N/A;   TRANSTHORACIC ECHOCARDIOGRAM  07/03/2020; 10/28/20; 12/03/20   06/2020 TTE EF 60-65%, grd II DD, severe pulm art HTN, severe mitral valve regurg, mod/sev tricuspic valve regurg. Conf on TEE 08/2020.  10/28/20 (s/p MV clip procedure) mild-mod MVR, EF 55-60%. 11/2020 EF 60%, mild/mod MR w/mean grad 4mm hg and mod to sev TR.   TRANSTHORACIC ECHOCARDIOGRAM     10/2022 EF 60-65%, nl LV fxn, grd II DD, severe MV clip regurg, severe pulm HTN.  02/01/23 (post MV clip#2: EF 60-65%, nl RV and LV fxn, inc PA pressure, severe tricusp regurg, no signif MR.   TRANSTHORACIC ECHOCARDIOGRAM     04/20/23-->1 month s/p mTEER: EF 55-60%, s/p mTEER with mild residual MR with mean graident of 7 mm hg as well as severe TR, severe biatrial enlargement and severe pulm HTN. Iatrogenic ASD with mostly left to right shunting.    Outpatient Medications Prior to Visit   Medication Sig Dispense Refill   amoxicillin  (AMOXIL ) 500 MG tablet Take 4 tablets by mouth one hour prior to all dental appointments (Patient taking differently: as needed. Take 4 tablets by mouth one hour prior to all dental appointments) 4 tablet 3   Ascorbic Acid (VITAMIN C) 1000 MG tablet Take 1,000 mg by mouth daily.     azelastine  (ASTELIN ) 0.1 % nasal spray Place 2 sprays into both nostrils 2 (two) times daily. (Patient taking differently: Place 2 sprays into both nostrils as needed for allergies.) 30 mL 12   Calcium Carb-Cholecalciferol (CALCIUM 600 + D PO) Take 1 tablet by mouth daily.     carvedilol  (COREG ) 25 MG tablet Take 25 mg by mouth 2 (two) times daily with a meal.     Cholecalciferol (DIALYVITE VITAMIN D   5000) 125 MCG (5000 UT) capsule Take 5,000 Units by mouth daily.     cyanocobalamin  (VITAMIN B12) 1000 MCG tablet Take 1,000 mcg by mouth daily.     hydrALAZINE  (APRESOLINE ) 25 MG tablet 1 tab as needed for bp>180 (Patient taking differently: Take 12.5 mg by mouth daily. 1 tab as needed for bp>180 (Patient takes 1/2 tab with Carvedilol .)     Multiple Vitamin (MULTIVITAMIN WITH MINERALS) TABS tablet Take 1 tablet by mouth in the morning.     Multiple Vitamins-Minerals (PRESERVISION AREDS PO) Take 1 tablet by mouth in the morning and at bedtime.      nitroGLYCERIN  (NITRODUR - DOSED IN MG/24 HR) 0.2 mg/hr patch 1/2 patch over L Achilles tendon bid 30 patch 12   sodium chloride  (MURO 128) 5 % ophthalmic ointment Place 1 Application into both eyes at bedtime as needed (dry eyes).     spironolactone  (ALDACTONE ) 25 MG tablet Take 1 tablet (25 mg total) by mouth daily. 90 tablet 3   vitamin E 180 MG (400 UNITS) capsule Take 400 Units by mouth in the morning.     diazepam  (VALIUM ) 2 MG tablet 1 tab po every evening as needed for anxiety and sleep 15 tablet 0   ibandronate  (BONIVA ) 150 MG tablet Take 1 tablet (150 mg total) by mouth every 30 (thirty) days. Take in the morning with a full  glass of water , on an empty stomach, and do not take anything else by mouth or lie down for the next 30 min. (Patient not taking: Reported on 08/22/2024) 3 tablet 3   omeprazole  (PRILOSEC) 20 MG capsule Take 20 mg by mouth daily. (Patient not taking: Reported on 08/22/2024)     No facility-administered medications prior to visit.    Allergies  Allergen Reactions   Irbesartan  Other (See Comments)    Hair loss   Advil  [Ibuprofen ]     Bleeds excessively    Barbiturates Nausea And Vomiting   Demerol Nausea And Vomiting   Doxycycline  Nausea Only   Hydrochlorothiazide Other (See Comments)    unknown   Losartan Potassium     hair loss   Olmesartan Medoxomil     hair loss   Telmisartan     cramps    Review of Systems As per HPI  PE:    08/22/2024    1:27 PM 08/22/2024    1:16 PM 06/20/2024   10:49 AM  Vitals with BMI  Height  5' 3 5' 3  Weight  117 lbs 6 oz 115 lbs 10 oz  BMI  20.8 20.48  Systolic 147 156 881  Diastolic 71 67 70  Pulse  55 67     Physical Exam  Gen: Alert, well appearing.  Patient is oriented to person, place, time, and situation. AFFECT: pleasant, lucid thought and speech. CV: RRR, no m/r/g.   LUNGS: CTA bilat, nonlabored resps, good aeration in all lung fields. EXT: no clubbing or cyanosis.  no edema.    LABS:  Last CBC Lab Results  Component Value Date   WBC 6.5 03/05/2024   HGB 14.0 03/05/2024   HCT 43.6 03/05/2024   MCV 90 03/05/2024   MCH 28.9 03/05/2024   RDW 14.5 03/05/2024   PLT 258 03/05/2024   Last metabolic panel Lab Results  Component Value Date   GLUCOSE 94 05/15/2024   NA 142 05/15/2024   K 4.4 05/15/2024   CL 101 05/15/2024   CO2 24 05/15/2024   BUN 19 05/15/2024   CREATININE  0.78 05/15/2024   EGFR 73 05/15/2024   CALCIUM 9.5 05/15/2024   PROT 6.2 03/05/2024   ALBUMIN 4.4 03/05/2024   LABGLOB 1.8 03/05/2024   BILITOT 1.0 03/05/2024   ALKPHOS 65 03/05/2024   AST 22 03/05/2024   ALT 17 03/05/2024   ANIONGAP 9  12/20/2023   Last lipids Lab Results  Component Value Date   CHOL 139 07/02/2020   HDL 80.30 07/02/2020   LDLCALC 53 07/02/2020   TRIG 30.0 07/02/2020   CHOLHDL 2 07/02/2020   Last thyroid  functions Lab Results  Component Value Date   TSH 2.37 07/02/2020   Last vitamin D  Lab Results  Component Value Date   VD25OH 29.48 (L) 05/12/2023   Last vitamin B12 and Folate Lab Results  Component Value Date   VITAMINB12 923 (H) 10/09/2015   IMPRESSION AND PLAN:  #1 labile hypertension. Continue carvedilol  25 mg twice daily, spironolactone  25 mg daily, and hydralazine  12.5 mg daily.  She may take an additional 1/2-1 tab as needed severe blood pressure elevation. Check basic metabolic panel today.  2.  GER. She does well on 40 mg of omeprazole  daily--> refilled today.  3.  Osteoporosis and history of vitamin D  deficiency. She takes ibandronate  150 mg monthly, calcium supplement, and 5000 units of vitamin D  daily daily. Monitor basic metabolic panel and vitamin D  level today. She declines DEXA.  #4 chronic diastolic heart failure. Stable on current meds.  No diuretic. She is followed by Dr. Ladona now.  An After Visit Summary was printed and given to the patient.  FOLLOW UP: No follow-ups on file.  Signed:  Gerlene Hockey, MD           08/22/2024

## 2024-09-02 ENCOUNTER — Other Ambulatory Visit: Payer: Self-pay | Admitting: Family Medicine

## 2024-11-01 ENCOUNTER — Ambulatory Visit: Payer: Self-pay

## 2024-11-01 NOTE — Telephone Encounter (Signed)
 FYI Only or Action Required?: FYI only for provider: appointment scheduled on 1/12 at caller request.  Patient was last seen in primary care on 08/22/2024 by McGowen, Aleene DEL, MD.  Called Nurse Triage reporting Fatigue.  Symptoms began several days ago.  Interventions attempted: Nothing.  Symptoms are: unchanged.  Triage Disposition: See Physician Within 24 Hours  Patient/caregiver understands and will follow disposition?: No  Copied from CRM #[id number]. Topic: Clinical - Red Word Triage >> Nov 01, 2024  3:20 PM Deleta RAMAN wrote: Red Word that prompted transfer to Nurse Triage: patient has weakness and bad cough daughter states extremely congested and was spitting up mucus Reason for Disposition  [1] MODERATE weakness (e.g., interferes with work, school, normal activities) AND [2] persists > 3 days  Answer Assessment - Initial Assessment Questions 1. DESCRIPTION: Describe how you are feeling.     weakness 2. SEVERITY: How bad is it?  Can you stand and walk?     Weakness, can still stand and walk, just wiped her out 3. ONSET: When did these symptoms begin? (e.g., hours, days, weeks, months)     About 4 days 4. CAUSE: What do you think is causing the weakness or fatigue? (e.g., not drinking enough fluids, medical problem, trouble sleeping)     Unsure, states she has a cough 5. NEW MEDICINES:  Have you started on any new medicines recently? (e.g., opioid pain medicines, benzodiazepines, muscle relaxants, antidepressants, antihistamines, neuroleptics, beta blockers)     denies 6. OTHER SYMPTOMS: Do you have any other symptoms? (e.g., chest pain, fever, cough, SOB, vomiting, diarrhea, bleeding, other areas of pain)     Cough, denies fevers, denies worsening SOB  Daughter calling requesting pt be seen by via telephone call as pt is to sick to get dressed. Per caller pt has not taken any at home flu/covid tests, requesting medications. Advised that pt should be seen in the  next 24 hours, caller requested that pt have next available pcp appt, 1/12.  Protocols used: Weakness (Generalized) and Fatigue-A-AH

## 2024-11-01 NOTE — Telephone Encounter (Signed)
 FYI, upcoming appt Mon Jan 12

## 2024-11-05 ENCOUNTER — Encounter: Payer: Self-pay | Admitting: Family Medicine

## 2024-11-05 ENCOUNTER — Ambulatory Visit: Payer: Self-pay | Admitting: Family Medicine

## 2024-11-05 ENCOUNTER — Ambulatory Visit: Admitting: Family Medicine

## 2024-11-05 VITALS — BP 186/79 | HR 66 | Temp 97.8°F | Wt 120.4 lb

## 2024-11-05 DIAGNOSIS — M7989 Other specified soft tissue disorders: Secondary | ICD-10-CM

## 2024-11-05 DIAGNOSIS — R7989 Other specified abnormal findings of blood chemistry: Secondary | ICD-10-CM

## 2024-11-05 DIAGNOSIS — R0609 Other forms of dyspnea: Secondary | ICD-10-CM

## 2024-11-05 DIAGNOSIS — J180 Bronchopneumonia, unspecified organism: Secondary | ICD-10-CM

## 2024-11-05 LAB — BASIC METABOLIC PANEL WITH GFR
BUN: 18 mg/dL (ref 6–23)
CO2: 33 meq/L — ABNORMAL HIGH (ref 19–32)
Calcium: 8.3 mg/dL — ABNORMAL LOW (ref 8.4–10.5)
Chloride: 106 meq/L (ref 96–112)
Creatinine, Ser: 0.57 mg/dL (ref 0.40–1.20)
GFR: 81.55 mL/min
Glucose, Bld: 88 mg/dL (ref 70–99)
Potassium: 4 meq/L (ref 3.5–5.1)
Sodium: 145 meq/L (ref 135–145)

## 2024-11-05 LAB — CBC WITH DIFFERENTIAL/PLATELET
Basophils Absolute: 0 K/uL (ref 0.0–0.1)
Basophils Relative: 0.7 % (ref 0.0–3.0)
Eosinophils Absolute: 0.1 K/uL (ref 0.0–0.7)
Eosinophils Relative: 2 % (ref 0.0–5.0)
HCT: 39.7 % (ref 36.0–46.0)
Hemoglobin: 13.1 g/dL (ref 12.0–15.0)
Lymphocytes Relative: 17.5 % (ref 12.0–46.0)
Lymphs Abs: 1.2 K/uL (ref 0.7–4.0)
MCHC: 32.9 g/dL (ref 30.0–36.0)
MCV: 89.1 fl (ref 78.0–100.0)
Monocytes Absolute: 0.6 K/uL (ref 0.1–1.0)
Monocytes Relative: 9.3 % (ref 3.0–12.0)
Neutro Abs: 4.9 K/uL (ref 1.4–7.7)
Neutrophils Relative %: 70.5 % (ref 43.0–77.0)
Platelets: 317 K/uL (ref 150.0–400.0)
RBC: 4.45 Mil/uL (ref 3.87–5.11)
RDW: 15.3 % (ref 11.5–15.5)
WBC: 7 K/uL (ref 4.0–10.5)

## 2024-11-05 LAB — BRAIN NATRIURETIC PEPTIDE: Pro B Natriuretic peptide (BNP): 978 pg/mL — ABNORMAL HIGH (ref 1.0–100.0)

## 2024-11-05 LAB — D-DIMER, QUANTITATIVE: D-Dimer, Quant: 1.18 ug{FEU}/mL — ABNORMAL HIGH

## 2024-11-05 MED ORDER — OMEPRAZOLE 40 MG PO CPDR: DELAYED_RELEASE_CAPSULE | ORAL | Status: AC

## 2024-11-05 MED ORDER — AMOXICILLIN-POT CLAVULANATE 875-125 MG PO TABS: 1.0000 | ORAL_TABLET | Freq: Two times a day (BID) | ORAL | 0 refills | Status: AC

## 2024-11-05 MED ORDER — AZELASTINE HCL 0.1 % NA SOLN: NASAL | 6 refills | Status: AC

## 2024-11-05 MED ORDER — DIAZEPAM 5 MG PO TABS: ORAL_TABLET | ORAL | 1 refills | Status: AC

## 2024-11-05 NOTE — Progress Notes (Unsigned)
 OFFICE VISIT  11/05/2024  CC:  Chief Complaint  Patient presents with   trouble walking    Did not take meds today    Patient is a 89 y.o. female who presents for respiratory symptoms with fatigue.  HPI: ***  Past Medical History:  Diagnosis Date   Achilles tendon rupture    left, no surgery required   Chronic diastolic heart failure (HCC)    Gross hematuria 12/2020   Klebsiella on urine clx x2 but no UTI sx's.  CT showed tiny stones in each renal pelvis but no ureteral or bladder stones, no mass.  Urol did cysto->normal. Plan is annual UA, rpt hematuria w/u in 3-5 yrs if persistent pos.   History of kidney stones    Hypertension    Hypertensive retinopathy of both eyes    Dr. cleatus   Low back pain    spondylosis, listhesis, +scoliosis at L/S jxn   Lower extremity edema    lasix  prn   OA (osteoarthritis)    Osteopenia    Osteoporosis 07/2021   07/2021 T score -4.6   S/P mitral valve clip implantation 11/06/2020   s/p TEER with one XTW MitraClip on A2P2 by Dr. Wonda.  DAPT w/ASA and plavix  x 3 mo post procedure recommended.   Severe mitral regurgitation    Mitral valve clip 10/2020.  Clip #25 January 2023   Severe pulmonary hypertension (HCC) 2021   transth echo; moderate by TEE 08/2020   Systolic murmur 07/03/2020   severe mitral regurge, mod/severe tricuspic regurg   Tremor of right hand     Past Surgical History:  Procedure Laterality Date   APPENDECTOMY     89 yrs old   BACK SURGERY  2004   ruptured disc   CHOLECYSTECTOMY     age 32   CYSTOSCOPY  02/24/2021   (for gross hematuria) ->normal.   DEXA  07/2021   2022 T score -4.6 (radius)   LUMBAR LAMINECTOMY  2004   RIGHT/LEFT HEART CATH AND CORONARY ANGIOGRAPHY N/A 09/16/2020   No CAD. Procedure: RIGHT/LEFT HEART CATH AND CORONARY ANGIOGRAPHY;  Surgeon: Claudene Victory ORN, MD;  Location: [facility] INVASIVE CV LAB;  Service: Cardiovascular;  Laterality: N/A;   TEE WITHOUT CARDIOVERSION N/A 09/16/2020   Procedure:  TRANSESOPHAGEAL ECHOCARDIOGRAM (TEE);  Surgeon: Lonni Slain, MD;  Location: [facility] ENDOSCOPY;  Service: Cardiovascular;  Laterality: N/A;   TEE WITHOUT CARDIOVERSION N/A 11/06/2020   Procedure: TRANSESOPHAGEAL ECHOCARDIOGRAM (TEE);  Surgeon: Wonda Sharper, MD;  Location: [facility] INVASIVE CV LAB;  Service: Cardiovascular;  Laterality: N/A;   TEE WITHOUT CARDIOVERSION N/A 06/16/2022   Procedure: TRANSESOPHAGEAL ECHOCARDIOGRAM (TEE);  Surgeon: Francyne Headland, MD;  Location: [facility] ENDOSCOPY;  Service: Cardiovascular;  Laterality: N/A;   TEE WITHOUT CARDIOVERSION N/A 03/02/2023   Procedure: TRANSESOPHAGEAL ECHOCARDIOGRAM;  Surgeon: Wonda Sharper, MD;  Location: [facility] INVASIVE CV LAB;  Service: Cardiovascular;  Laterality: N/A;   TONSILLECTOMY     age 82   TOTAL KNEE ARTHROPLASTY Left 06/02/2021   Procedure: LEFT TOTAL KNEE ARTHROPLASTY;  Surgeon: Beverley Evalene BIRCH, MD;  Location: [facility] ORS;  Service: Orthopedics;  Laterality: Left;   TRANSCATHETER MITRAL EDGE TO EDGE REPAIR N/A 11/06/2020   Procedure: MITRAL VALVE REPAIR;  Surgeon: Wonda Sharper, MD;  Location: [facility] INVASIVE CV LAB;  Service: Cardiovascular;  Laterality: N/A;   TRANSCATHETER MITRAL EDGE TO EDGE REPAIR N/A 03/02/2023   Procedure: MITRAL VALVE REPAIR;  Surgeon: Wonda Sharper, MD;  Location: [facility] INVASIVE CV LAB;  Service: Cardiovascular;  Laterality: N/A;  TRANSTHORACIC ECHOCARDIOGRAM  07/03/2020; 10/28/20; 12/03/20   06/2020 TTE EF 60-65%, grd II DD, severe pulm art HTN, severe mitral valve regurg, mod/sev tricuspic valve regurg. Conf on TEE 08/2020.  10/28/20 (s/p MV clip procedure) mild-mod MVR, EF 55-60%. 11/2020 EF 60%, mild/mod MR w/mean grad 4mm hg and mod to sev TR.   TRANSTHORACIC ECHOCARDIOGRAM     10/2022 EF 60-65%, nl LV fxn, grd II DD, severe MV clip regurg, severe pulm HTN.  02/01/23 (post MV clip#2: EF 60-65%, nl RV and LV fxn, inc PA pressure, severe tricusp regurg, no signif MR.   TRANSTHORACIC ECHOCARDIOGRAM     04/20/23-->1 month  s/p mTEER: EF 55-60%, s/p mTEER with mild residual MR with mean graident of 7 mm hg as well as severe TR, severe biatrial enlargement and severe pulm HTN. Iatrogenic ASD with mostly left to right shunting.  Unchanged 02/2024    Outpatient Medications Prior to Visit  Medication Sig Dispense Refill   Ascorbic Acid (VITAMIN C) 1000 MG tablet Take 1,000 mg by mouth daily.     azelastine  (ASTELIN ) 0.1 % nasal spray Place 2 sprays into both nostrils 2 (two) times daily. (Patient taking differently: Place 2 sprays into both nostrils as needed for allergies.) 30 mL 12   Calcium Carb-Cholecalciferol (CALCIUM 600 + D PO) Take 1 tablet by mouth daily.     carvedilol  (COREG ) 25 MG tablet Take 1 tablet (25 mg total) by mouth 2 (two) times daily with a meal. 180 tablet 3   Cholecalciferol (DIALYVITE VITAMIN D  5000) 125 MCG (5000 UT) capsule Take 5,000 Units by mouth daily.     cyanocobalamin  (VITAMIN B12) 1000 MCG tablet Take 1,000 mcg by mouth daily.     hydrALAZINE  (APRESOLINE ) 25 MG tablet 1 tab as needed for bp>180 (Patient taking differently: Take 12.5 mg by mouth daily. 1 tab as needed for bp>180 (Patient takes 1/2 tab with Carvedilol .)     ibandronate  (BONIVA ) 150 MG tablet Take 1 tablet (150 mg total) by mouth every 30 (thirty) days. Take in the morning with a full glass of water , on an empty stomach, and do not take anything else by mouth or lie down for the next 30 min. 3 tablet 3   Multiple Vitamin (MULTIVITAMIN WITH MINERALS) TABS tablet Take 1 tablet by mouth in the morning.     Multiple Vitamins-Minerals (PRESERVISION AREDS PO) Take 1 tablet by mouth in the morning and at bedtime.      nitroGLYCERIN  (NITRODUR - DOSED IN MG/24 HR) 0.2 mg/hr patch 1/2 patch over L Achilles tendon bid 30 patch 12   omeprazole  (PRILOSEC) 40 MG capsule Take 1 capsule (40 mg total) by mouth daily. 90 capsule 3   sodium chloride  (MURO 128) 5 % ophthalmic ointment Place 1 Application into both eyes at bedtime as needed (dry  eyes).     spironolactone  (ALDACTONE ) 25 MG tablet Take 1 tablet (25 mg total) by mouth daily. 90 tablet 3   vitamin E 180 MG (400 UNITS) capsule Take 400 Units by mouth in the morning.     amoxicillin  (AMOXIL ) 500 MG tablet Take 4 tablets by mouth one hour prior to all dental appointments (Patient not taking: Reported on 11/05/2024) 4 tablet 3   No facility-administered medications prior to visit.    Allergies[1]  Review of Systems  As per HPI  PE:    11/05/2024    1:55 PM 11/05/2024    1:54 PM 08/22/2024    1:27 PM  Vitals with BMI  Weight  120 lbs 6 oz   Systolic 186 181 852  Diastolic 79 85 71  Pulse  66      Physical Exam  ***  LABS:  Last CBC Lab Results  Component Value Date   WBC 6.5 03/05/2024   HGB 14.0 03/05/2024   HCT 43.6 03/05/2024   MCV 90 03/05/2024   MCH 28.9 03/05/2024   RDW 14.5 03/05/2024   PLT 258 03/05/2024   Last metabolic panel Lab Results  Component Value Date   GLUCOSE 111 (H) 08/22/2024   NA 144 08/22/2024   K 4.0 08/22/2024   CL 106 08/22/2024   CO2 33 (H) 08/22/2024   BUN 24 (H) 08/22/2024   CREATININE 0.67 08/22/2024   GFR 78.55 08/22/2024   CALCIUM 9.3 08/22/2024   PROT 6.2 03/05/2024   ALBUMIN 4.4 03/05/2024   LABGLOB 1.8 03/05/2024   BILITOT 1.0 03/05/2024   ALKPHOS 65 03/05/2024   AST 22 03/05/2024   ALT 17 03/05/2024   ANIONGAP 9 12/20/2023   Last thyroid  functions Lab Results  Component Value Date   TSH 2.37 07/02/2020   IMPRESSION AND PLAN:  No problem-specific Assessment & Plan notes found for this encounter.   An After Visit Summary was printed and given to the patient.  FOLLOW UP: No follow-ups on file.  Signed:  Gerlene Hockey, MD           11/05/2024     [1]  Allergies Allergen Reactions   Irbesartan  Other (See Comments)    Hair loss   Advil  [Ibuprofen ]     Bleeds excessively    Barbiturates Nausea And Vomiting   Demerol Nausea And Vomiting   Doxycycline  Nausea Only   Hydrochlorothiazide  Other (See Comments)    unknown   Losartan Potassium     hair loss   Olmesartan Medoxomil     hair loss   Telmisartan     cramps

## 2024-11-06 NOTE — Telephone Encounter (Unsigned)
 Copied from CRM #[id number]. Topic: General - Other >> Nov 05, 2024  5:08 PM Erin [name] wrote: Reason for CRM: patient is requesting for the left leg to be included in the CAT scan also

## 2024-11-07 ENCOUNTER — Other Ambulatory Visit (HOSPITAL_COMMUNITY): Payer: Self-pay

## 2024-11-08 ENCOUNTER — Ambulatory Visit (HOSPITAL_BASED_OUTPATIENT_CLINIC_OR_DEPARTMENT_OTHER)
Admission: RE | Admit: 2024-11-08 | Discharge: 2024-11-08 | Disposition: A | Source: Ambulatory Visit | Attending: Family Medicine | Admitting: Family Medicine

## 2024-11-08 ENCOUNTER — Ambulatory Visit (HOSPITAL_BASED_OUTPATIENT_CLINIC_OR_DEPARTMENT_OTHER)
Admission: RE | Admit: 2024-11-08 | Discharge: 2024-11-08 | Disposition: A | Discharge status: Home / Self Care | Source: Ambulatory Visit | Attending: Family Medicine | Admitting: Family Medicine

## 2024-11-08 DIAGNOSIS — J180 Bronchopneumonia, unspecified organism: Secondary | ICD-10-CM

## 2024-11-08 DIAGNOSIS — M7989 Other specified soft tissue disorders: Secondary | ICD-10-CM | POA: Insufficient documentation

## 2024-11-08 DIAGNOSIS — R0609 Other forms of dyspnea: Secondary | ICD-10-CM | POA: Diagnosis present

## 2024-11-08 DIAGNOSIS — R7989 Other specified abnormal findings of blood chemistry: Secondary | ICD-10-CM | POA: Insufficient documentation

## 2024-12-12 ENCOUNTER — Ambulatory Visit

## 2025-02-20 ENCOUNTER — Ambulatory Visit: Admitting: Family Medicine

## 2025-06-19 ENCOUNTER — Encounter (INDEPENDENT_AMBULATORY_CARE_PROVIDER_SITE_OTHER): Admitting: Ophthalmology
